# Patient Record
Sex: Female | Born: 1965
Health system: Southern US, Community
[De-identification: ages and names within clinical notes are randomized; demographics above are authoritative.]

## PROBLEM LIST (undated history)

## (undated) ENCOUNTER — Emergency Department (HOSPITAL_COMMUNITY): Payer: 59 | Source: Home / Self Care

## (undated) DIAGNOSIS — I10 Essential (primary) hypertension: Secondary | ICD-10-CM

## (undated) DIAGNOSIS — C20 Malignant neoplasm of rectum: Secondary | ICD-10-CM

## (undated) DIAGNOSIS — C801 Malignant (primary) neoplasm, unspecified: Secondary | ICD-10-CM

## (undated) DIAGNOSIS — F32A Depression, unspecified: Secondary | ICD-10-CM

## (undated) DIAGNOSIS — G8929 Other chronic pain: Secondary | ICD-10-CM

## (undated) DIAGNOSIS — M549 Dorsalgia, unspecified: Secondary | ICD-10-CM

## (undated) DIAGNOSIS — F329 Major depressive disorder, single episode, unspecified: Secondary | ICD-10-CM

## (undated) DIAGNOSIS — E0590 Thyrotoxicosis, unspecified without thyrotoxic crisis or storm: Secondary | ICD-10-CM

## (undated) HISTORY — DX: Depression, unspecified: F32.A

## (undated) HISTORY — DX: Major depressive disorder, single episode, unspecified: F32.9

## (undated) HISTORY — PX: ILEOSTOMY: SHX1783

## (undated) HISTORY — PX: BLADDER SURGERY: SHX569

## (undated) HISTORY — PX: VAGINAL HYSTERECTOMY: SHX2639

## (undated) HISTORY — PX: EYE SURGERY: SHX253

## (undated) HISTORY — PX: ABDOMINAL HYSTERECTOMY: SHX81

---

## 2002-09-14 ENCOUNTER — Encounter: Payer: Self-pay | Admitting: Family Medicine

## 2002-09-14 ENCOUNTER — Ambulatory Visit (HOSPITAL_COMMUNITY): Admission: RE | Admit: 2002-09-14 | Discharge: 2002-09-14 | Payer: Self-pay | Admitting: Family Medicine

## 2004-06-14 ENCOUNTER — Emergency Department (HOSPITAL_COMMUNITY): Admission: EM | Admit: 2004-06-14 | Discharge: 2004-06-14 | Payer: Self-pay | Admitting: Emergency Medicine

## 2006-12-02 ENCOUNTER — Emergency Department (HOSPITAL_COMMUNITY): Admission: EM | Admit: 2006-12-02 | Discharge: 2006-12-03 | Payer: Self-pay | Admitting: Emergency Medicine

## 2009-01-08 ENCOUNTER — Encounter (INDEPENDENT_AMBULATORY_CARE_PROVIDER_SITE_OTHER): Payer: Self-pay | Admitting: Unknown Physician Specialty

## 2009-01-08 ENCOUNTER — Other Ambulatory Visit: Admission: RE | Admit: 2009-01-08 | Discharge: 2009-01-08 | Payer: Self-pay | Admitting: Unknown Physician Specialty

## 2010-09-07 ENCOUNTER — Emergency Department (HOSPITAL_COMMUNITY): Payer: Self-pay

## 2010-09-07 ENCOUNTER — Emergency Department (HOSPITAL_COMMUNITY)
Admission: EM | Admit: 2010-09-07 | Discharge: 2010-09-07 | Disposition: A | Discharge status: Home / Self Care | Payer: Self-pay | Attending: Emergency Medicine | Admitting: Emergency Medicine

## 2010-09-07 DIAGNOSIS — J069 Acute upper respiratory infection, unspecified: Secondary | ICD-10-CM | POA: Insufficient documentation

## 2010-09-07 DIAGNOSIS — J029 Acute pharyngitis, unspecified: Secondary | ICD-10-CM | POA: Insufficient documentation

## 2010-09-07 DIAGNOSIS — Z79899 Other long term (current) drug therapy: Secondary | ICD-10-CM | POA: Insufficient documentation

## 2010-09-07 LAB — RAPID STREP SCREEN (MED CTR MEBANE ONLY): Streptococcus, Group A Screen (Direct): NEGATIVE

## 2011-03-17 LAB — WOUND CULTURE

## 2013-02-10 ENCOUNTER — Telehealth: Payer: Self-pay | Admitting: Family Medicine

## 2013-02-13 NOTE — Telephone Encounter (Signed)
Unable to reach by phone

## 2013-11-26 ENCOUNTER — Emergency Department (HOSPITAL_COMMUNITY)
Admission: EM | Admit: 2013-11-26 | Discharge: 2013-11-26 | Disposition: A | Discharge status: Home / Self Care | Payer: Self-pay | Attending: Emergency Medicine | Admitting: Emergency Medicine

## 2013-11-26 ENCOUNTER — Encounter (HOSPITAL_COMMUNITY): Payer: Self-pay | Admitting: Emergency Medicine

## 2013-11-26 DIAGNOSIS — I10 Essential (primary) hypertension: Secondary | ICD-10-CM | POA: Insufficient documentation

## 2013-11-26 DIAGNOSIS — G629 Polyneuropathy, unspecified: Secondary | ICD-10-CM

## 2013-11-26 DIAGNOSIS — G609 Hereditary and idiopathic neuropathy, unspecified: Secondary | ICD-10-CM | POA: Insufficient documentation

## 2013-11-26 DIAGNOSIS — R739 Hyperglycemia, unspecified: Secondary | ICD-10-CM

## 2013-11-26 DIAGNOSIS — Z79899 Other long term (current) drug therapy: Secondary | ICD-10-CM | POA: Insufficient documentation

## 2013-11-26 DIAGNOSIS — R7309 Other abnormal glucose: Secondary | ICD-10-CM | POA: Insufficient documentation

## 2013-11-26 HISTORY — DX: Essential (primary) hypertension: I10

## 2013-11-26 LAB — BASIC METABOLIC PANEL
BUN: 12 mg/dL (ref 6–23)
CALCIUM: 9 mg/dL (ref 8.4–10.5)
CO2: 28 meq/L (ref 19–32)
Chloride: 102 mEq/L (ref 96–112)
Creatinine, Ser: 0.63 mg/dL (ref 0.50–1.10)
GFR calc Af Amer: >90 mL/min (ref >90)
GLUCOSE: 144 mg/dL — AB (ref 70–99)
Potassium: 3.8 mEq/L (ref 3.7–5.3)
Sodium: 138 mEq/L (ref 137–147)

## 2013-11-26 MED ORDER — GABAPENTIN 300 MG PO CAPS: 300.0000 mg | ORAL_CAPSULE | Freq: Two times a day (BID) | ORAL | Status: DC

## 2013-11-26 MED ORDER — HYDROCODONE-ACETAMINOPHEN 5-325 MG PO TABS: 1.0000 | ORAL_TABLET | ORAL | Status: DC | PRN

## 2013-11-26 MED ORDER — IBUPROFEN 800 MG PO TABS
800.0000 mg | ORAL_TABLET | Freq: Once | ORAL | Status: AC
Start: 2013-11-26 — End: 2013-11-26
  Administered 2013-11-26: 800 mg via ORAL
  Filled 2013-11-26: qty 1

## 2013-11-26 NOTE — ED Notes (Addendum)
My c/o intermittent bilateral foot pain. Pt describes pain as throbbing.

## 2013-11-26 NOTE — Discharge Instructions (Signed)
High Blood Sugar High blood sugar (hyperglycemia) means that the level of sugar in your blood is higher than it should be. Signs of high blood sugar include:  Feeling thirsty.  Frequent peeing (urinating).  Feeling tired or sleepy.  Dry mouth.  Vision changes.  Feeling weak.  Feeling hungry but losing weight.  Numbness and tingling in your hands or feet.  Headache. When you ignore these signs, your blood sugar may keep going up. These problems may get worse, and other problems may begin. HOME CARE  Check your blood sugars as told by your doctor. Write down the numbers with the date and time.  Take the right amount of insulin or diabetes pills at the right time. Write down the dose with date and time.  Refill your insulin or diabetes pills before running out.  Watch what you eat. Follow your meal plan.  Drink liquids without sugar, such as water. Check with your doctor if you have kidney or heart disease.  Follow your doctor's orders for exercise. Exercise at the same time of day.  Keep your doctor's appointments. GET HELP RIGHT AWAY IF:   You have trouble thinking or are confused.  You have fast breathing with fruity smelling breath.  You pass out (faint).  You have 2 to 3 days of high blood sugars and you do not know why.  You have chest pain.  You are feeling sick to your stomach (nauseous) or throwing up (vomiting).  You have sudden vision changes. MAKE SURE YOU:   Understand these instructions.  Will watch your condition.  Will get help right away if you are not doing well or get worse. Document Released: 03/15/2009 Document Revised: 08/10/2011 Document Reviewed: 03/15/2009 [facility] Patient Information 2015 [place], [place]. This information is not intended to replace advice given to you by your health care provider. Make sure you discuss any questions you have with your health care provider.  Peripheral Neuropathy Peripheral neuropathy is a type of  nerve damage. It affects nerves that carry signals between the spinal cord and other parts of the body. These are called peripheral nerves. With peripheral neuropathy, one nerve or a group of nerves may be damaged.  CAUSES  Many things can damage peripheral nerves. For some people with peripheral neuropathy, the cause is unknown. Some causes include:  Diabetes. This is the most common cause of peripheral neuropathy.  Injury to a nerve.  Pressure or stress on a nerve that lasts a long time.  Too little vitamin B. Alcoholism can lead to this.  Infections.  Autoimmune diseases, such as multiple sclerosis and systemic lupus erythematosus.  Inherited nerve diseases.  Some medicines, such as cancer drugs.  Toxic substances, such as lead and mercury.  Too little blood flowing to the legs.  Kidney disease.  Thyroid disease. SIGNS AND SYMPTOMS  Different people have different symptoms. The symptoms you have will depend on which of your nerves is damaged. Common symptoms include:  Loss of feeling (numbness) in the feet and hands.  Tingling in the feet and hands.  Pain that burns.  Very sensitive skin.  Weakness.  Not being able to move a part of the body (paralysis).  Muscle twitching.  Clumsiness or poor coordination.  Loss of balance.  Not being able to control your bladder.  Feeling dizzy.  Sexual problems. DIAGNOSIS  Peripheral neuropathy is a symptom, not a disease. Finding the cause of peripheral neuropathy can be hard. To figure that out, your health care provider will take  a medical history and do a physical exam. A neurological exam will also be done. This involves checking things affected by your brain, spinal cord, and nerves (nervous system). For example, your health care provider will check your reflexes, how you move, and what you can feel.  Other types of tests may also be ordered, such as:  Blood tests.  A test of the fluid in your spinal  cord.  Imaging tests, such as CT scans or an MRI.  Electromyography (EMG). This test checks the nerves that control muscles.  Nerve conduction velocity tests. These tests check how fast messages pass through your nerves.  Nerve biopsy. A small piece of nerve is removed. It is then checked under a microscope. TREATMENT   Medicine is often used to treat peripheral neuropathy. Medicines may include:  Pain-relieving medicines. Prescription or over-the-counter medicine may be suggested.  Antiseizure medicine. This may be used for pain.  Antidepressants. These also may help ease pain from neuropathy.  Lidocaine. This is a numbing medicine. You might wear a patch or be given a shot.  Mexiletine. This medicine is typically used to help control irregular heart rhythms.  Surgery. Surgery may be needed to relieve pressure on a nerve or to destroy a nerve that is causing pain.  Physical therapy to help movement.  Assistive devices to help movement. HOME CARE INSTRUCTIONS   Only take over-the-counter or prescription medicines as directed by your health care provider. Follow the instructions carefully for any given medicines. Do not take any other medicines without first getting approval from your health care provider.  If you have diabetes, work closely with your health care provider to keep your blood sugar under control.  If you have numbness in your feet:  Check every day for signs of injury or infection. Watch for redness, warmth, and swelling.  Wear padded socks and comfortable shoes. These help protect your feet.  Do not do things that put pressure on your damaged nerve.  Do not smoke. Smoking keeps blood from getting to damaged nerves.  Avoid or limit alcohol. Too much alcohol can cause a lack of B vitamins. These vitamins are needed for healthy nerves.  Develop a good support system. Coping with peripheral neuropathy can be stressful. Talk to a mental health specialist or  join a support group if you are struggling.  Follow up with your health care provider as directed. SEEK MEDICAL CARE IF:   You have new signs or symptoms of peripheral neuropathy.  You are struggling emotionally from dealing with peripheral neuropathy.  You have a fever. SEEK IMMEDIATE MEDICAL CARE IF:   You have an injury or infection that is not healing.  You feel very dizzy or begin vomiting.  You have chest pain.  You have trouble breathing. Document Released: 05/08/2002 Document Revised: 01/28/2011 Document Reviewed: 01/23/2013 [facility] Patient Information 2015 [place], [place]. This information is not intended to replace advice given to you by your health care provider. Make sure you discuss any questions you have with your health care provider.   You may take the hydrocodone prescribed for pain relief.  This will make you drowsy - do not drive within 4 hours of taking this medication.

## 2013-11-26 NOTE — Progress Notes (Signed)
ED/CM noted patient did not have health insurance and/or PCP listed in the computer.  Patient was given the [facility] resources handout with information on the clinics, food pantries, and community discount card.  Patient expressed appreciation for information received.

## 2013-11-26 NOTE — ED Provider Notes (Signed)
CSN: [id number]     Arrival date & time 11/26/13  1024 History  This chart was scribed for Evalee Jefferson, PA-C, non-physician practitioner working with Tanna Furry, MD by Vernell Barrier, ED scribe. This patient was seen in room APFT20/APFT20 and the patient's care was started at 11:15 AM.     Chief Complaint  Patient presents with  . Foot Pain   The history is provided by the patient. No language interpreter was used.   HPI Comments: Destiny [name] is a 48 y.o. female who presents to the Emergency Department complaining of shooting and intermittent diffuse bilateral foot pain; onset 2 weeks ago. Episodes lasts approximately a few seconds at a time but are present throughout the day. Pain shoots through toes and occasionally goes up through the dorsum of the foot. Describes her feet sometimes feel as if she is "walking on hot coals." Pain unchanged sitting or standing. No muscle spasm.Taking Tylenol for pain with no relief. Reports no injury or trauma related to current pain. 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 Treated with 600 mg ibuprofen at that time. States back pain and current foot pain are unrelated. Was following up with a doctor regularly as well as going to physical therapy at that time. Followed by the clinic on [address] currently and was told she was prediabetic and pre-hypertensive at last visit. Has not followed up since then but states she is watching her diet. No personal or family hx of gout. Denies back pain, fever.   Past Medical History  Diagnosis Date  . Hypertension    Past Surgical History  Procedure Laterality Date  . Eye surgery     No family history on file. History  Substance Use Topics  . Smoking status: Never Smoker   . Smokeless tobacco: Not on file  . Alcohol Use: No   OB History   Grav Para Term Preterm Abortions TAB SAB Ect Mult Living                 Review of Systems   Constitutional: Negative for fever.  Musculoskeletal: Negative for arthralgias and myalgias.  Neurological: Positive for numbness. Negative for weakness.   Allergies  Review of patient's allergies indicates no known allergies.  Home Medications   Prior to Admission medications   Medication Sig Start Date End Date Taking? Authorizing [employer]  gabapentin (NEURONTIN) 300 MG capsule Take 1 capsule (300 mg total) by mouth 2 (two) times daily. 11/26/13   Evalee Jefferson, PA-C  HYDROcodone-acetaminophen (NORCO/VICODIN) 5-325 MG per tablet Take 1 tablet by mouth every 4 (four) hours as needed. 11/26/13   Evalee Jefferson, PA-C   Triage vitals: BP 132/66  Pulse 88  Temp(Src) 97.3 F (36.3 C) (Oral)  Resp 18  Ht 5' 1.5" (1.562 m)  Wt 204 lb (92.534 kg)  BMI 37.93 kg/m2  SpO2 97%  LMP 11/12/2013  Physical Exam  Nursing note and vitals reviewed. Constitutional: She appears well-developed and well-nourished.  HENT:  Head: Atraumatic.  Neck: Normal range of motion.  Cardiovascular:  Pulses:      Dorsalis pedis pulses are 2+ on the right side, and 2+ on the left side.  Pulses equal bilaterally  Musculoskeletal: She exhibits tenderness.  No reproducible pain with palpation or light touch to bilateral feet. Dorsalis pedis pulses are full.  Neurological: She is alert. She has normal strength. She displays normal reflexes. No sensory deficit.  Skin: Skin is warm and dry.  Psychiatric: She has a normal mood and affect.    ED Course  Procedures (including critical care time) DIAGNOSTIC STUDIES: Oxygen Saturation is 97% on room air, normal by my interpretation.    COORDINATION OF CARE: At 11:23 AM: Discussed treatment plan with patient which includes BMP. Patient agrees.    Labs Review Results for orders placed during the hospital encounter of 29/51/88  BASIC METABOLIC PANEL      Result Value Ref Range   Sodium 138  137 - 147 mEq/L   Potassium 3.8  3.7 - 5.3 mEq/L   Chloride 102  96 - 112  mEq/L   CO2 28  19 - 32 mEq/L   Glucose, Bld 144 (*) 70 - 99 mg/dL   BUN 12  6 - 23 mg/dL   Creatinine, Ser 0.63  0.50 - 1.10 mg/dL   Calcium 9.0  8.4 - 10.5 mg/dL   GFR calc non Af Amer >90  >90 mL/min   GFR calc Af Amer >90  >90 mL/min    Imaging Review No results found.   EKG Interpretation None      MDM   Final diagnoses:  Peripheral neuropathy  Hyperglycemia   Patients labs and/or radiological studies were viewed and considered during the medical decision making and disposition process. Discussed patient with Dr. Jeneen Rinks who advised starting patient on gabapentin. Also prescribed hydrocodone for sx relief until gabapentin starts being effective.   She was advised f/u with her doctor for a recheck of her condition for further management prn.  The patient appears reasonably screened and/or stabilized for discharge and I doubt any other medical condition or other [facility] requiring further screening, evaluation, or treatment in the ED at this time prior to discharge.  I personally performed the services described in this documentation, which was scribed in my presence. The recorded information has been reviewed and is accurate.     Evalee Jefferson, PA-C 11/28/13 1243

## 2013-11-26 NOTE — ED Notes (Signed)
PT c/o bilateral foot throbbing pains worsening over the past 2 weeks. PT also c/o numbness to feet at times.

## 2013-12-04 NOTE — ED Provider Notes (Signed)
Medical screening examination/treatment/procedure(s) were performed by non-physician practitioner and as supervising physician I was immediately available for consultation/collaboration.   EKG Interpretation None        Tanna Furry, MD 12/04/13 [phone]

## 2014-05-06 ENCOUNTER — Emergency Department (HOSPITAL_COMMUNITY)
Admission: EM | Admit: 2014-05-06 | Discharge: 2014-05-06 | Disposition: A | Discharge status: Home / Self Care | Payer: Self-pay | Attending: Emergency Medicine | Admitting: Emergency Medicine

## 2014-05-06 ENCOUNTER — Emergency Department (HOSPITAL_COMMUNITY): Payer: Self-pay

## 2014-05-06 ENCOUNTER — Encounter (HOSPITAL_COMMUNITY): Payer: Self-pay

## 2014-05-06 DIAGNOSIS — R05 Cough: Secondary | ICD-10-CM | POA: Insufficient documentation

## 2014-05-06 DIAGNOSIS — M791 Myalgia: Secondary | ICD-10-CM | POA: Insufficient documentation

## 2014-05-06 DIAGNOSIS — J029 Acute pharyngitis, unspecified: Secondary | ICD-10-CM | POA: Insufficient documentation

## 2014-05-06 DIAGNOSIS — R509 Fever, unspecified: Secondary | ICD-10-CM | POA: Insufficient documentation

## 2014-05-06 DIAGNOSIS — R0981 Nasal congestion: Secondary | ICD-10-CM | POA: Insufficient documentation

## 2014-05-06 DIAGNOSIS — R062 Wheezing: Secondary | ICD-10-CM | POA: Insufficient documentation

## 2014-05-06 DIAGNOSIS — J3489 Other specified disorders of nose and nasal sinuses: Secondary | ICD-10-CM | POA: Insufficient documentation

## 2014-05-06 DIAGNOSIS — R6889 Other general symptoms and signs: Secondary | ICD-10-CM

## 2014-05-06 DIAGNOSIS — R51 Headache: Secondary | ICD-10-CM | POA: Insufficient documentation

## 2014-05-06 MED ORDER — PREDNISONE 20 MG PO TABS
40.0000 mg | ORAL_TABLET | Freq: Once | ORAL | Status: AC
  Administered 2014-05-06: 40 mg via ORAL
  Filled 2014-05-06: qty 2

## 2014-05-06 MED ORDER — ALBUTEROL SULFATE HFA 108 (90 BASE) MCG/ACT IN AERS
2.0000 | INHALATION_SPRAY | RESPIRATORY_TRACT | Status: DC | PRN
  Administered 2014-05-06: 2 via RESPIRATORY_TRACT
  Filled 2014-05-06: qty 6.7

## 2014-05-06 MED ORDER — HYDROCODONE-ACETAMINOPHEN 5-325 MG PO TABS: 1.0000 | ORAL_TABLET | ORAL | Status: DC | PRN

## 2014-05-06 MED ORDER — PREDNISONE 10 MG PO TABS: 20.0000 mg | ORAL_TABLET | Freq: Two times a day (BID) | ORAL | Status: DC

## 2014-05-06 MED ORDER — HYDROCOD POLST-CHLORPHEN POLST 10-8 MG/5ML PO LQCR
5.0000 mL | Freq: Once | ORAL | Status: AC
  Administered 2014-05-06: 5 mL via ORAL
  Filled 2014-05-06: qty 5

## 2014-05-06 MED ORDER — IPRATROPIUM-ALBUTEROL 0.5-2.5 (3) MG/3ML IN SOLN
3.0000 mL | Freq: Once | RESPIRATORY_TRACT | Status: AC
  Administered 2014-05-06: 3 mL via RESPIRATORY_TRACT
  Filled 2014-05-06: qty 3

## 2014-05-06 NOTE — ED Notes (Signed)
Pt seen and evaluated by EDNP for initial assessment. 

## 2014-05-06 NOTE — ED Notes (Signed)
Discharge signature system not working. Pt given d/c instructions, verbalized understanding.

## 2014-05-06 NOTE — ED Notes (Signed)
Upper respiratory congestion, fever,chills/sweats.body aches. Vomiting yesterday. No flu shot.

## 2014-05-06 NOTE — ED Provider Notes (Signed)
CSN: [id number]     Arrival date & time 05/06/14  2116 History   First MD Initiated Contact with Patient 05/06/14 2150     Chief Complaint  Patient presents with  . URI     (Consider location/radiation/quality/duration/timing/severity/associated sxs/prior Treatment) Patient is a 48 y.o. female presenting with URI. The history is provided by the patient.  URI Presenting symptoms: congestion and cough   Severity:  Moderate Duration:  7 days Timing:  Constant Progression:  Worsening Chronicity:  New Relieved by:  Nothing Worsened by:  Nothing tried Ineffective treatments:  OTC medications Associated symptoms: headaches    Destiny [name] is a 48 y.o. female who presents to the ED with cough, cold and congestion that started a week ago. She reports fever and chills but did not take her temperature. She has body aches and coughs until she vomits. She did not get flu shot this year. She has taken multiple OTC medication for cough and congestion without relief.   Past Medical History  Diagnosis Date  . Hypertension    Past Surgical History  Procedure Laterality Date  . Eye surgery     No family history on file. History  Substance Use Topics  . Smoking status: Never Smoker   . Smokeless tobacco: Not on file  . Alcohol Use: No   OB History    No data available     Review of Systems  HENT: Positive for congestion.   Respiratory: Positive for cough.   Gastrointestinal: Vomiting: with cough.  Neurological: Positive for headaches.  all other systems negative    Allergies  Review of patient's allergies indicates no known allergies.  Home Medications   Prior to Admission medications   Medication Sig Start Date End Date Taking? Authorizing Provider  HYDROcodone-acetaminophen (NORCO/VICODIN) 5-325 MG per tablet Take 1 tablet by mouth every 4 (four) hours as needed. Patient not taking: Reported on 05/06/2014 11/26/13   Evalee Jefferson, PA-C   BP 146/69 mmHg  Pulse 99  Temp(Src)  98.9 F (37.2 C) (Oral)  Resp 20  Ht 5' 1.5" (1.562 m)  Wt 180 lb 11.2 oz (81.965 kg)  BMI 33.59 kg/m2  SpO2 100%  LMP  (LMP Unknown) Physical Exam  Constitutional: She is oriented to person, place, and time. She appears well-developed and well-nourished. No distress.  HENT:  Head: Normocephalic and atraumatic.  Right Ear: Tympanic membrane normal.  Left Ear: Tympanic membrane normal.  Nose: Rhinorrhea present.  Mouth/Throat: Uvula is midline and mucous membranes are normal. Posterior oropharyngeal erythema present.  Eyes: Conjunctivae and EOM are normal.  Neck: Normal range of motion. Neck supple.  Cardiovascular: Normal rate and regular rhythm.   Pulmonary/Chest: Effort normal. No respiratory distress. She has decreased breath sounds. She has wheezes. She has no rales.  Abdominal: Soft. There is no tenderness.  Musculoskeletal: Normal range of motion.  Lymphadenopathy:    She has no cervical adenopathy.  Neurological: She is alert and oriented to person, place, and time. No cranial nerve deficit. Gait normal.  Skin: Skin is warm and dry.  Psychiatric: She has a normal mood and affect. Her behavior is normal.  Nursing note and vitals reviewed.   ED Course  Procedures  Duo Neb administered by respiratory therapy, patient with some improvement s/p treatment.  Prednisone 40 mg. PO, Tussionex 5 ml PO and Albuterol inhaler with instructions per respiratory prior to d/c.   Dg Chest 2 View  05/06/2014   CLINICAL DATA:  Fever, chills, cough, and congestion  for 1 week.  EXAM: CHEST  2 VIEW  COMPARISON:  09/07/2010  FINDINGS: The heart size and mediastinal contours are within normal limits. Both lungs are clear. The visualized skeletal structures are unremarkable.  IMPRESSION: No active cardiopulmonary disease.   Electronically Signed   By: Lucienne Capers M.D.   On: 05/06/2014 22:58     MDM  SUBJECTIVE:  Destiny [name] is a 48 y.o. female who present complaining of flu-like symptoms:  fevers, chills, myalgias, congestion, sore throat and cough for 7 days. Denies dyspnea OBJECTIVE: Appears moderately ill but not toxic; temperature as noted in vitals. Ears normal. Throat and pharynx mild erythema.  Neck supple. No adenopathy in the neck. Sinuses non tender. The chest is clear.  ASSESSMENT: Influenza like symptoms  PLAN: Symptomatic therapy suggested: rest, increase fluids, gargle prn for sore throat, use mist of vaporizer prn and return prn if symptoms persist or worsen.     Medication List    STOP taking these medications        pseudoephedrine 30 MG tablet  Commonly known as:  SUDAFED      TAKE these medications        ALKA SELTZER PLUS PO  Take 2 capsules by mouth every 6 (six) hours as needed (Cold Symptoms).     guaiFENesin 600 MG 12 hr tablet  Commonly known as:  MUCINEX  Take 600 mg by mouth 2 (two) times daily.     HYDROcodone-acetaminophen 5-325 MG per tablet  Commonly known as:  NORCO/VICODIN  Take 1 tablet by mouth every 4 (four) hours as needed.     predniSONE 10 MG tablet  Commonly known as:  DELTASONE  Take 2 tablets (20 mg total) by mouth 2 (two) times daily with a meal.           Ashley Murrain, NP 05/06/14 [place], MD 05/08/14 2050

## 2014-05-06 NOTE — Discharge Instructions (Signed)
Use the albuterol inhaler as needed, take the prednisone to help with inflammation in the lungs and take the hydrocodone for the cough and aching. Follow up with your doctor or return here for worsening symptoms.

## 2015-02-08 ENCOUNTER — Emergency Department (HOSPITAL_COMMUNITY): Payer: No Typology Code available for payment source

## 2015-02-08 ENCOUNTER — Emergency Department (HOSPITAL_COMMUNITY)
Admission: EM | Admit: 2015-02-08 | Discharge: 2015-02-09 | Disposition: A | Discharge status: Home / Self Care | Payer: No Typology Code available for payment source | Attending: Emergency Medicine | Admitting: Emergency Medicine

## 2015-02-08 ENCOUNTER — Encounter (HOSPITAL_COMMUNITY): Payer: Self-pay | Admitting: Radiology

## 2015-02-08 DIAGNOSIS — I10 Essential (primary) hypertension: Secondary | ICD-10-CM | POA: Insufficient documentation

## 2015-02-08 DIAGNOSIS — Y998 Other external cause status: Secondary | ICD-10-CM | POA: Insufficient documentation

## 2015-02-08 DIAGNOSIS — S0990XA Unspecified injury of head, initial encounter: Secondary | ICD-10-CM | POA: Insufficient documentation

## 2015-02-08 DIAGNOSIS — S3992XA Unspecified injury of lower back, initial encounter: Secondary | ICD-10-CM | POA: Insufficient documentation

## 2015-02-08 DIAGNOSIS — S032XXA Dislocation of tooth, initial encounter: Secondary | ICD-10-CM

## 2015-02-08 DIAGNOSIS — Y9389 Activity, other specified: Secondary | ICD-10-CM | POA: Diagnosis not present

## 2015-02-08 DIAGNOSIS — Y92410 Unspecified street and highway as the place of occurrence of the external cause: Secondary | ICD-10-CM | POA: Diagnosis not present

## 2015-02-08 DIAGNOSIS — Z7952 Long term (current) use of systemic steroids: Secondary | ICD-10-CM | POA: Diagnosis not present

## 2015-02-08 DIAGNOSIS — Z79899 Other long term (current) drug therapy: Secondary | ICD-10-CM | POA: Diagnosis not present

## 2015-02-08 DIAGNOSIS — S022XXA Fracture of nasal bones, initial encounter for closed fracture: Secondary | ICD-10-CM

## 2015-02-08 DIAGNOSIS — S0992XA Unspecified injury of nose, initial encounter: Secondary | ICD-10-CM | POA: Diagnosis present

## 2015-02-08 DIAGNOSIS — N938 Other specified abnormal uterine and vaginal bleeding: Secondary | ICD-10-CM | POA: Insufficient documentation

## 2015-02-08 DIAGNOSIS — S3991XA Unspecified injury of abdomen, initial encounter: Secondary | ICD-10-CM | POA: Insufficient documentation

## 2015-02-08 LAB — I-STAT CHEM 8, ED
BUN: 13 mg/dL (ref 6–20)
CHLORIDE: 104 mmol/L (ref 101–111)
CREATININE: 0.5 mg/dL (ref 0.44–1.00)
Calcium, Ion: 1.2 mmol/L (ref 1.12–1.23)
GLUCOSE: 130 mg/dL — AB (ref 65–99)
HCT: 32 % — ABNORMAL LOW (ref 36.0–46.0)
Hemoglobin: 10.9 g/dL — ABNORMAL LOW (ref 12.0–15.0)
POTASSIUM: 4.1 mmol/L (ref 3.5–5.1)
Sodium: 142 mmol/L (ref 135–145)
TCO2: 24 mmol/L (ref 0–100)

## 2015-02-08 MED ORDER — IOHEXOL 300 MG/ML  SOLN
100.0000 mL | Freq: Once | INTRAMUSCULAR | Status: AC | PRN
  Administered 2015-02-08: 100 mL via INTRAVENOUS

## 2015-02-08 MED ORDER — HYDROMORPHONE HCL 1 MG/ML IJ SOLN
1.0000 mg | Freq: Once | INTRAMUSCULAR | Status: AC
  Administered 2015-02-08: 1 mg via INTRAVENOUS
  Filled 2015-02-08: qty 1

## 2015-02-08 MED ORDER — HYDROMORPHONE HCL 1 MG/ML IJ SOLN
1.0000 mg | Freq: Once | INTRAMUSCULAR | Status: AC
Start: 2015-02-08 — End: 2015-02-08
  Administered 2015-02-08: 1 mg via INTRAVENOUS
  Filled 2015-02-08: qty 1

## 2015-02-08 MED ORDER — ONDANSETRON HCL 4 MG/2ML IJ SOLN
4.0000 mg | Freq: Once | INTRAMUSCULAR | Status: AC
  Administered 2015-02-08: 4 mg via INTRAVENOUS
  Filled 2015-02-08: qty 2
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
## 2015-02-08 NOTE — ED Notes (Signed)
Patient C/O head, low back, neck and facial pain. Patient tearful on arrival.
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
## 2015-02-09 DIAGNOSIS — S022XXA Fracture of nasal bones, initial encounter for closed fracture: Secondary | ICD-10-CM | POA: Diagnosis not present

## 2015-02-09 MED ORDER — OXYCODONE-ACETAMINOPHEN 5-325 MG PO TABS: 1.0000 | ORAL_TABLET | ORAL | Status: DC | PRN

## 2015-02-09 MED ORDER — CHLORHEXIDINE GLUCONATE 0.12 % MT SOLN: 15.0000 mL | Freq: Two times a day (BID) | OROMUCOSAL | Status: DC

## 2015-02-09 MED ORDER — ONDANSETRON HCL 4 MG PO TABS: 4.0000 mg | ORAL_TABLET | Freq: Four times a day (QID) | ORAL | Status: DC

## 2015-02-09 NOTE — ED Notes (Signed)
Instructed significant other not to let patient drink from a straw.  Verbalizes understanding.

## 2015-02-09 NOTE — ED Notes (Signed)
Ambulated in hall with one person assist.  Good gait noted.  Tolerated well.  Alert and answering questions.  Discharged home per order.

## 2015-02-09 NOTE — ED Notes (Signed)
Dr. Ralene Bathe at the bedside at this time.
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
## 2015-02-15 ENCOUNTER — Ambulatory Visit (INDEPENDENT_AMBULATORY_CARE_PROVIDER_SITE_OTHER): Payer: PRIVATE HEALTH INSURANCE | Admitting: Physician Assistant

## 2015-02-15 ENCOUNTER — Encounter: Payer: Self-pay | Admitting: Physician Assistant

## 2015-02-15 VITALS — BP 131/76 | HR 100 | Ht 61.5 in | Wt 158.0 lb

## 2015-02-15 DIAGNOSIS — R5383 Other fatigue: Secondary | ICD-10-CM | POA: Diagnosis not present

## 2015-02-15 DIAGNOSIS — F411 Generalized anxiety disorder: Secondary | ICD-10-CM | POA: Diagnosis not present

## 2015-02-15 DIAGNOSIS — F329 Major depressive disorder, single episode, unspecified: Secondary | ICD-10-CM | POA: Diagnosis not present

## 2015-02-15 DIAGNOSIS — R7989 Other specified abnormal findings of blood chemistry: Secondary | ICD-10-CM | POA: Diagnosis not present

## 2015-02-15 DIAGNOSIS — Z0000 Encounter for general adult medical examination without abnormal findings: Secondary | ICD-10-CM

## 2015-02-15 DIAGNOSIS — F32A Depression, unspecified: Secondary | ICD-10-CM

## 2015-02-15 MED ORDER — LORAZEPAM 1 MG PO TABS: 1.0000 mg | ORAL_TABLET | Freq: Two times a day (BID) | ORAL | Status: DC

## 2015-02-15 MED ORDER — DULOXETINE HCL 30 MG PO CPEP
ORAL_CAPSULE | ORAL | Status: DC
Start: 2015-02-15 — End: 2015-05-02
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
## 2015-02-16 LAB — LIPID PANEL
CHOLESTEROL TOTAL: 111 mg/dL (ref 100–199)
Chol/HDL Ratio: 2.6 ratio units (ref 0.0–4.4)
HDL: 43 mg/dL (ref >39)
LDL Calculated: 49 mg/dL (ref 0–99)
TRIGLYCERIDES: 95 mg/dL (ref 0–149)
VLDL Cholesterol Cal: 19 mg/dL (ref 5–40)

## 2015-02-16 LAB — CBC WITH DIFFERENTIAL/PLATELET
BASOS ABS: 0 10*3/uL (ref 0.0–0.2)
Basos: 0 %
EOS (ABSOLUTE): 0.2 10*3/uL (ref 0.0–0.4)
Eos: 4 %
HEMOGLOBIN: 11.5 g/dL (ref 11.1–15.9)
Hematocrit: 34.1 % (ref 34.0–46.6)
IMMATURE GRANS (ABS): 0 10*3/uL (ref 0.0–0.1)
Immature Granulocytes: 0 %
LYMPHS: 29 %
Lymphocytes Absolute: 1.8 10*3/uL (ref 0.7–3.1)
MCH: 26.5 pg — ABNORMAL LOW (ref 26.6–33.0)
MCHC: 33.7 g/dL (ref 31.5–35.7)
MCV: 79 fL (ref 79–97)
MONOCYTES: 9 %
Monocytes Absolute: 0.6 10*3/uL (ref 0.1–0.9)
NEUTROS PCT: 58 %
Neutrophils Absolute: 3.6 10*3/uL (ref 1.4–7.0)
PLATELETS: 381 10*3/uL — AB (ref 150–379)
RBC: 4.34 x10E6/uL (ref 3.77–5.28)
RDW: 13.8 % (ref 12.3–15.4)
WBC: 6.3 10*3/uL (ref 3.4–10.8)

## 2015-02-16 LAB — CMP14+EGFR
A/G RATIO: 1.1 (ref 1.1–2.5)
ALK PHOS: 119 IU/L — AB (ref 39–117)
ALT: 13 IU/L (ref 0–32)
AST: 20 IU/L (ref 0–40)
Albumin: 4.1 g/dL (ref 3.5–5.5)
BUN/Creatinine Ratio: 20 (ref 9–23)
BUN: 9 mg/dL (ref 6–24)
Bilirubin Total: 0.5 mg/dL (ref 0.0–1.2)
CALCIUM: 9.8 mg/dL (ref 8.7–10.2)
CO2: 22 mmol/L (ref 18–29)
Chloride: 101 mmol/L (ref 97–108)
Creatinine, Ser: 0.44 mg/dL — ABNORMAL LOW (ref 0.57–1.00)
GFR calc Af Amer: 137 mL/min/{1.73_m2} (ref >59)
GFR calc non Af Amer: 119 mL/min/{1.73_m2} (ref >59)
GLOBULIN, TOTAL: 3.6 g/dL (ref 1.5–4.5)
Glucose: 131 mg/dL — ABNORMAL HIGH (ref 65–99)
POTASSIUM: 4.6 mmol/L (ref 3.5–5.2)
SODIUM: 139 mmol/L (ref 134–144)
Total Protein: 7.7 g/dL (ref 6.0–8.5)

## 2015-02-16 LAB — VITAMIN B12: VITAMIN B 12: 507 pg/mL (ref 211–946)

## 2015-02-16 LAB — VITAMIN D 25 HYDROXY (VIT D DEFICIENCY, FRACTURES): VIT D 25 HYDROXY: 38 ng/mL (ref 30.0–100.0)

## 2015-02-16 LAB — TSH: TSH: <0.006 u[IU]/mL — ABNORMAL LOW (ref 0.450–4.500)

## 2015-02-17 ENCOUNTER — Other Ambulatory Visit: Payer: Self-pay | Admitting: Physician Assistant

## 2015-02-17 DIAGNOSIS — R7989 Other specified abnormal findings of blood chemistry: Secondary | ICD-10-CM

## 2015-02-18 ENCOUNTER — Other Ambulatory Visit: Payer: Self-pay | Admitting: *Deleted

## 2015-02-18 ENCOUNTER — Other Ambulatory Visit: Payer: PRIVATE HEALTH INSURANCE

## 2015-02-18 DIAGNOSIS — E0590 Thyrotoxicosis, unspecified without thyrotoxic crisis or storm: Secondary | ICD-10-CM

## 2015-02-18 DIAGNOSIS — S022XXA Fracture of nasal bones, initial encounter for closed fracture: Secondary | ICD-10-CM | POA: Insufficient documentation

## 2015-02-18 NOTE — Addendum Note (Signed)
Addended by: Earlene Plater on: 02/18/2015 04:04 PM   Modules accepted: Orders

## 2015-02-19 LAB — THYROID PANEL WITH TSH
FREE THYROXINE INDEX: 15.5 — AB (ref 1.2–4.9)
T3 Uptake Ratio: 48 % — ABNORMAL HIGH (ref 24–39)
T4 TOTAL: 32.3 ug/dL — AB (ref 4.5–12.0)

## 2015-02-21 ENCOUNTER — Other Ambulatory Visit: Payer: Self-pay | Admitting: Physician Assistant

## 2015-02-21 DIAGNOSIS — E0590 Thyrotoxicosis, unspecified without thyrotoxic crisis or storm: Secondary | ICD-10-CM

## 2015-02-27 ENCOUNTER — Encounter: Payer: Self-pay | Admitting: Endocrinology

## 2015-02-27 ENCOUNTER — Ambulatory Visit (INDEPENDENT_AMBULATORY_CARE_PROVIDER_SITE_OTHER): Payer: No Typology Code available for payment source | Admitting: Endocrinology

## 2015-02-27 VITALS — BP 134/72 | HR 88 | Temp 96.5°F | Ht 61.5 in | Wt 156.0 lb

## 2015-02-27 DIAGNOSIS — E0590 Thyrotoxicosis, unspecified without thyrotoxic crisis or storm: Secondary | ICD-10-CM

## 2015-02-27 MED ORDER — METHIMAZOLE 10 MG PO TABS: 40.0000 mg | ORAL_TABLET | Freq: Two times a day (BID) | ORAL | Status: DC

## 2015-02-27 NOTE — Progress Notes (Signed)
Subjective:    Patient ID: Destiny [name], female    DOB: March 01, 1966, 49 y.o.   MRN: [id number]  HPI Pt reports she was dx'ed with hyperthyroidism a few weeks ago.  she has never been known to have a thyroid problem before.  she has never had XRT to the anterior neck, or thyroid surgery.  she has never had dedicated thyroid imaging.  she does not consume kelp or any other prescribed or non-prescribed thyroid medication.  He has never been on amiodarone.  She has slight palpitations in the chest, and assoc weight loss (20 lbs). Past Medical History  Diagnosis Date  . Hypertension     Past Surgical History  Procedure Laterality Date  . Eye surgery      Social History   Social History  . Marital Status: Divorced    Spouse Name: N/A  . Number of Children: N/A  . Years of Education: N/A   Occupational History  . Not on file.   Social History Main Topics  . Smoking status: Never Smoker   . Smokeless tobacco: Not on file  . Alcohol Use: No  . Drug Use: No  . Sexual Activity: Yes    Birth Control/ Protection: Surgical   Other Topics Concern  . Not on file   Social History Narrative    Current Outpatient Prescriptions on File Prior to Visit  Medication Sig Dispense Refill  . acetaminophen (TYLENOL) 500 MG tablet Take 1,000 mg by mouth every 8 (eight) hours as needed for moderate pain.    . chlorhexidine (PERIDEX) 0.12 % solution 15 mLs by Mouth Rinse route 2 (two) times daily. Swish and spit twice daily 473 mL 0  . DULoxetine (CYMBALTA) 30 MG capsule Week 1: 1 capsule in the am, Week 2: 2 capsules in the am 60 capsule 3  . LORazepam (ATIVAN) 1 MG tablet Take 1 tablet (1 mg total) by mouth 2 (two) times daily. 60 tablet 0  . ondansetron (ZOFRAN) 4 MG tablet Take 1 tablet (4 mg total) by mouth every 6 (six) hours. 12 tablet 0  . oxyCODONE-acetaminophen (PERCOCET/ROXICET) 5-325 MG per tablet Take 1 tablet by mouth every 4 (four) hours as needed for severe pain. 15 tablet 0   No  current facility-administered medications on file prior to visit.    No Known Allergies  Family History  Problem Relation Age of Onset  . Thyroid disease Mother     BP 134/72 mmHg  Pulse 88  Temp(Src) 96.5 F (35.8 C) (Axillary)  Ht 5' 1.5" (1.562 m)  Wt 156 lb (70.761 kg)  BMI 29.00 kg/m2  SpO2 98%  LMP  (LMP Unknown)   Review of Systems denies fever, headache, hoarseness, visual loss, chest pain, sob, polyuria, excessive diaphoresis, anxiety, easy bruising, and rhinorrhea.  She has mild diarrhea, tremor, heat intolerance, and mild muscle weakness.      Objective:   Physical Exam VS: see vs page GEN: no distress HEAD: head: no deformity eyes: no periorbital swelling, no proptosis.  Disconjugate gaze (chronic strabismus).   external nose and ears are normal mouth: no lesion seen NECK: thyroid is approx 4-5 times normal size, diffuse CHEST WALL: no deformity LUNGS:  Clear to auscultation CV: reg rate and rhythm, no murmur ABD: abdomen is soft, nontender.  no hepatosplenomegaly.  not distended.  no hernia.   MUSCULOSKELETAL: muscle bulk and strength are grossly normal.  no obvious joint swelling.  gait is normal and steady.  EXTEMITIES: no deformity.  no ulcer on the feet.  feet are of normal color and temp.  no edema PULSES: dorsalis pedis intact bilat.  no carotid bruit NEURO:  cn 2-12 grossly intact.   readily moves all 4's.  sensation is intact to touch on the feet.  Slight tremor of the hands SKIN:  Normal texture and temperature.  No rash or suspicious lesion is visible.  Slightly diaphoretic NODES:  None palpable at the neck.  PSYCH: alert, well-oriented.  Does not appear anxious nor depressed.    CT of c-spine (02/08/15): no mention is made of a goiter  Lab Results  Component Value Date   TSH <0.006* 02/18/2015   T4TOTAL 32.3* 02/18/2015   I have reviewed outside records, and summarized: Pt was seen for anxiety, and hyperthyroidism was noted.  She was ref  here.      Assessment & Plan:  Hyperthyroidism, new, prob due to Grave's dz.  She is too hyperthyroid to have I-131 now.    Patient is advised the following: Patient Instructions  i have sent a prescription to your pharmacy, to slow down the thyroid. if ever you have fever while taking methimazole, stop it and call us, because of the risk of a rare side-effect We can reconsider the radioactive iodine in the future if you wish. Please come back for a follow-up appointment in 2-3 weeks.     Hyperthyroidism The thyroid is a large gland located in the lower front part of your neck. The thyroid helps control metabolism. Metabolism is how your body uses food. It controls metabolism with the hormone thyroxine. When the thyroid is overactive, it produces too much hormone. When this happens, these following problems may occur:   Nervousness  Heat intolerance  Weight loss (in spite of increase food intake)  Diarrhea  Change in hair or skin texture  Palpitations (heart skipping or having extra beats)  Tachycardia (rapid heart rate)  Loss of menstruation (amenorrhea)  Shaking of the hands CAUSES  Grave's Disease (the immune system attacks the thyroid gland). This is the most common cause.  Inflammation of the thyroid gland.  Tumor (usually benign) in the thyroid gland or elsewhere.  Excessive use of thyroid medications (both prescription and 'natural').  Excessive ingestion of Iodine. DIAGNOSIS  To prove hyperthyroidism, your caregiver may do blood tests and ultrasound tests. Sometimes the signs are hidden. It may be necessary for your caregiver to watch this illness with blood tests, either before or after diagnosis and treatment. TREATMENT Short-term treatment There are several treatments to control symptoms. Drugs called beta blockers may give some relief. Drugs that decrease hormone production will provide temporary relief in many people. These measures will usually not give  permanent relief. Definitive therapy There are treatments available which can be discussed between you and your caregiver which will permanently treat the problem. These treatments range from surgery (removal of the thyroid), to the use of radioactive iodine (destroys the thyroid by radiation), to the use of antithyroid drugs (interfere with hormone synthesis). The first two treatments are permanent and usually successful. They most often require hormone replacement therapy for life. This is because it is impossible to remove or destroy the exact amount of thyroid required to make a person euthyroid (normal). HOME CARE INSTRUCTIONS  See your caregiver if the problems you are being treated for get worse. Examples of this would be the problems listed above. SEEK MEDICAL CARE IF: Your general condition worsens. MAKE SURE YOU:   Understand these instructions.  Will  watch your condition.  Will get help right away if you are not doing well or get worse. Document Released: 05/18/2005 Document Revised: 08/10/2011 Document Reviewed: 09/29/2006 [facility] Patient Information 2015 [place], [place]. This information is not intended to replace advice given to you by your health care provider. Make sure you discuss any questions you have with your health care provider.

## 2015-02-27 NOTE — Patient Instructions (Addendum)
i have sent a prescription to your pharmacy, to slow down the thyroid. if ever you have fever while taking methimazole, stop it and call us, because of the risk of a rare side-effect We can reconsider the radioactive iodine in the future if you wish. Please come back for a follow-up appointment in 2-3 weeks.     Hyperthyroidism The thyroid is a large gland located in the lower front part of your neck. The thyroid helps control metabolism. Metabolism is how your body uses food. It controls metabolism with the hormone thyroxine. When the thyroid is overactive, it produces too much hormone. When this happens, these following problems may occur:   Nervousness  Heat intolerance  Weight loss (in spite of increase food intake)  Diarrhea  Change in hair or skin texture  Palpitations (heart skipping or having extra beats)  Tachycardia (rapid heart rate)  Loss of menstruation (amenorrhea)  Shaking of the hands CAUSES  Grave's Disease (the immune system attacks the thyroid gland). This is the most common cause.  Inflammation of the thyroid gland.  Tumor (usually benign) in the thyroid gland or elsewhere.  Excessive use of thyroid medications (both prescription and 'natural').  Excessive ingestion of Iodine. DIAGNOSIS  To prove hyperthyroidism, your caregiver may do blood tests and ultrasound tests. Sometimes the signs are hidden. It may be necessary for your caregiver to watch this illness with blood tests, either before or after diagnosis and treatment. TREATMENT Short-term treatment There are several treatments to control symptoms. Drugs called beta blockers may give some relief. Drugs that decrease hormone production will provide temporary relief in many people. These measures will usually not give permanent relief. Definitive therapy There are treatments available which can be discussed between you and your caregiver which will permanently treat the problem. These treatments  range from surgery (removal of the thyroid), to the use of radioactive iodine (destroys the thyroid by radiation), to the use of antithyroid drugs (interfere with hormone synthesis). The first two treatments are permanent and usually successful. They most often require hormone replacement therapy for life. This is because it is impossible to remove or destroy the exact amount of thyroid required to make a person euthyroid (normal). HOME CARE INSTRUCTIONS  See your caregiver if the problems you are being treated for get worse. Examples of this would be the problems listed above. SEEK MEDICAL CARE IF: Your general condition worsens. MAKE SURE YOU:   Understand these instructions.  Will watch your condition.  Will get help right away if you are not doing well or get worse. Document Released: 05/18/2005 Document Revised: 08/10/2011 Document Reviewed: 09/29/2006 [facility] Patient Information 2015 [place], [place]. This information is not intended to replace advice given to you by your health care provider. Make sure you discuss any questions you have with your health care provider.

## 2015-03-20 ENCOUNTER — Ambulatory Visit (INDEPENDENT_AMBULATORY_CARE_PROVIDER_SITE_OTHER): Payer: No Typology Code available for payment source | Admitting: Endocrinology

## 2015-03-20 ENCOUNTER — Encounter: Payer: Self-pay | Admitting: Endocrinology

## 2015-03-20 VITALS — BP 128/76 | HR 92 | Temp 98.3°F | Ht 62.0 in | Wt 161.0 lb

## 2015-03-20 DIAGNOSIS — E0590 Thyrotoxicosis, unspecified without thyrotoxic crisis or storm: Secondary | ICD-10-CM

## 2015-03-20 LAB — TSH: TSH: 0.23 u[IU]/mL — AB (ref 0.35–4.50)

## 2015-03-20 LAB — T4, FREE: Free T4: 2.6 ng/dL — ABNORMAL HIGH (ref 0.60–1.60)

## 2015-03-20 MED ORDER — METHIMAZOLE 10 MG PO TABS: 10.0000 mg | ORAL_TABLET | Freq: Two times a day (BID) | ORAL | Status: DC

## 2015-03-20 NOTE — Progress Notes (Signed)
   Subjective:    Patient ID: Destiny [name], female    DOB: 09-03-1965, 49 y.o.   MRN: [id number]  HPI Pt returns for f/u of hyperthyroidism (dx'ed 2016; probably due to [place] dz; she has never had dedicated thyroid imaging; tapazole was chosen as rx, due to severity).  Since on the tapazole, she feels better in general.   Past Medical History  Diagnosis Date  . Hypertension     Past Surgical History  Procedure Laterality Date  . Eye surgery      Social History   Social History  . Marital Status: Divorced    Spouse Name: N/A  . Number of Children: N/A  . Years of Education: N/A   Occupational History  . Not on file.   Social History Main Topics  . Smoking status: Never Smoker   . Smokeless tobacco: Not on file  . Alcohol Use: No  . Drug Use: No  . Sexual Activity: Yes    Birth Control/ Protection: Surgical   Other Topics Concern  . Not on file   Social History Narrative    Current Outpatient Prescriptions on File Prior to Visit  Medication Sig Dispense Refill  . acetaminophen (TYLENOL) 500 MG tablet Take 1,000 mg by mouth every 8 (eight) hours as needed for moderate pain.    . chlorhexidine (PERIDEX) 0.12 % solution 15 mLs by Mouth Rinse route 2 (two) times daily. Swish and spit twice daily 473 mL 0  . DULoxetine (CYMBALTA) 30 MG capsule Week 1: 1 capsule in the am, Week 2: 2 capsules in the am 60 capsule 3  . LORazepam (ATIVAN) 1 MG tablet Take 1 tablet (1 mg total) by mouth 2 (two) times daily. 60 tablet 0  . ondansetron (ZOFRAN) 4 MG tablet Take 1 tablet (4 mg total) by mouth every 6 (six) hours. 12 tablet 0  . oxyCODONE-acetaminophen (PERCOCET/ROXICET) 5-325 MG per tablet Take 1 tablet by mouth every 4 (four) hours as needed for severe pain. 15 tablet 0   No current facility-administered medications on file prior to visit.    No Known Allergies  Family History  Problem Relation Age of Onset  . Thyroid disease Mother     BP 128/76 mmHg  Pulse 92   Temp(Src) 98.3 F (36.8 C) (Oral)  Ht 5\' 2"  (1.575 m)  Wt 161 lb (73.029 kg)  BMI 29.44 kg/m2  SpO2 99%  LMP  (LMP Unknown)  Review of Systems Denies fever    Objective:   Physical Exam VITAL SIGNS:  See vs page GENERAL: no distress Skin: not diaphoretic.   Neuro: slight tremor of the hands.      Lab Results  Component Value Date   TSH 0.23* 03/20/2015   T4TOTAL 32.3* 02/18/2015      Assessment & Plan:  Hyperthyroidism, improved  Patient is advised the following: Patient Instructions  blood tests are requested for you today.  We'll let you know about the results.   if ever you have fever while taking methimazole, stop it and call us, because of the risk of a rare side-effect.   We can reconsider the radioactive iodine in the future if you wish.   Please come back for a follow-up appointment in 6 weeks.     addendum: reduce the methimazole to 10 mg bid

## 2015-03-20 NOTE — Patient Instructions (Addendum)
blood tests are requested for you today.  We'll let you know about the results.   if ever you have fever while taking methimazole, stop it and call us, because of the risk of a rare side-effect.   We can reconsider the radioactive iodine in the future if you wish.   Please come back for a follow-up appointment in 6 weeks.

## 2015-03-22 ENCOUNTER — Telehealth: Payer: Self-pay | Admitting: Endocrinology

## 2015-03-22 NOTE — Telephone Encounter (Signed)
Patient is calling for the results of her lab work °

## 2015-03-26 NOTE — Telephone Encounter (Signed)
Patient advised of lab work on 03/22/2015.

## 2015-05-01 ENCOUNTER — Encounter: Payer: Self-pay | Admitting: Endocrinology

## 2015-05-01 ENCOUNTER — Ambulatory Visit (INDEPENDENT_AMBULATORY_CARE_PROVIDER_SITE_OTHER): Payer: No Typology Code available for payment source | Admitting: Endocrinology

## 2015-05-01 VITALS — BP 152/84 | HR 76 | Temp 97.8°F | Ht 62.0 in | Wt 177.0 lb

## 2015-05-01 DIAGNOSIS — E0590 Thyrotoxicosis, unspecified without thyrotoxic crisis or storm: Secondary | ICD-10-CM | POA: Diagnosis not present

## 2015-05-01 LAB — TSH: TSH: 0.52 u[IU]/mL (ref 0.35–4.50)

## 2015-05-01 NOTE — Patient Instructions (Addendum)
Please avoid biotin, as this interferes with your thyroid blood test.   blood tests are requested for you today.  We'll let you know about the results.   if ever you have fever while taking methimazole, stop it and call us, even if the reason is obvious, because of the risk of a rare side-effect.   Please come back for a follow-up appointment in 2 months.

## 2015-05-01 NOTE — Progress Notes (Signed)
   Subjective:    Patient ID: Destiny [name], female    DOB: Feb 27, 1966, 49 y.o.   MRN: [id number]  HPI Pt returns for f/u of hyperthyroidism (dx'ed 2016; probably due to [place] dz; she has never had dedicated thyroid imaging; tapazole was chosen as rx, due to severity).  Since on the tapazole, she feels better in general.   Past Medical History  Diagnosis Date  . Hypertension     Past Surgical History  Procedure Laterality Date  . Eye surgery      Social History   Social History  . Marital Status: Divorced    Spouse Name: N/A  . Number of Children: N/A  . Years of Education: N/A   Occupational History  . Not on file.   Social History Main Topics  . Smoking status: Never Smoker   . Smokeless tobacco: Not on file  . Alcohol Use: No  . Drug Use: No  . Sexual Activity: Yes    Birth Control/ Protection: Surgical   Other Topics Concern  . Not on file   Social History Narrative    Current Outpatient Prescriptions on File Prior to Visit  Medication Sig Dispense Refill  . acetaminophen (TYLENOL) 500 MG tablet Take 1,000 mg by mouth every 8 (eight) hours as needed for moderate pain.    . methimazole (TAPAZOLE) 10 MG tablet Take 1 tablet (10 mg total) by mouth 2 (two) times daily. 60 tablet 1   No current facility-administered medications on file prior to visit.    No Known Allergies  Family History  Problem Relation Age of Onset  . Thyroid disease Mother     BP 152/84 mmHg  Pulse 76  Temp(Src) 97.8 F (36.6 C) (Oral)  Ht 5\' 2"  (1.575 m)  Wt 177 lb (80.287 kg)  BMI 32.37 kg/m2  SpO2 97%  LMP  (LMP Unknown)   Review of Systems Denies fever    Objective:   Physical Exam VITAL SIGNS:  See vs page GENERAL: no distress NECK: thyroid is approx 4-5 times normal size, diffuse Skin: not diaphoretic Neuro: no tremor   Lab Results  Component Value Date   TSH 0.52 05/01/2015   T4TOTAL 32.3* 02/18/2015      Assessment & Plan:  hperthyroidism:  well-controlled.  she chooses tapazole long-term, due to cost of I-131 rx  Patient is advised the following: Patient Instructions  Please avoid biotin, as this interferes with your thyroid blood test.   blood tests are requested for you today.  We'll let you know about the results.   if ever you have fever while taking methimazole, stop it and call us, even if the reason is obvious, because of the risk of a rare side-effect.   Please come back for a follow-up appointment in 2 months.    addendum: Please continue the same medication.

## 2015-05-02 ENCOUNTER — Encounter: Payer: Self-pay | Admitting: Family Medicine

## 2015-05-02 ENCOUNTER — Other Ambulatory Visit: Payer: Self-pay | Admitting: *Deleted

## 2015-05-02 ENCOUNTER — Ambulatory Visit (INDEPENDENT_AMBULATORY_CARE_PROVIDER_SITE_OTHER): Payer: PRIVATE HEALTH INSURANCE | Admitting: Family Medicine

## 2015-05-02 VITALS — BP 140/77 | HR 79 | Temp 97.1°F | Ht 62.0 in | Wt 174.0 lb

## 2015-05-02 DIAGNOSIS — I159 Secondary hypertension, unspecified: Secondary | ICD-10-CM | POA: Diagnosis not present

## 2015-05-02 DIAGNOSIS — G6289 Other specified polyneuropathies: Secondary | ICD-10-CM | POA: Diagnosis not present

## 2015-05-02 DIAGNOSIS — R7301 Impaired fasting glucose: Secondary | ICD-10-CM | POA: Diagnosis not present

## 2015-05-02 DIAGNOSIS — F32A Depression, unspecified: Secondary | ICD-10-CM | POA: Insufficient documentation

## 2015-05-02 DIAGNOSIS — F411 Generalized anxiety disorder: Secondary | ICD-10-CM

## 2015-05-02 DIAGNOSIS — F329 Major depressive disorder, single episode, unspecified: Secondary | ICD-10-CM | POA: Diagnosis not present

## 2015-05-02 LAB — POCT GLYCOSYLATED HEMOGLOBIN (HGB A1C): HEMOGLOBIN A1C: 5.5

## 2015-05-02 MED ORDER — SERTRALINE HCL 50 MG PO TABS: 50.0000 mg | ORAL_TABLET | Freq: Every day | ORAL | Status: DC

## 2015-05-02 MED ORDER — PREGABALIN 50 MG PO CAPS: 50.0000 mg | ORAL_CAPSULE | Freq: Three times a day (TID) | ORAL | Status: DC

## 2015-05-02 MED ORDER — HYDROCHLOROTHIAZIDE 12.5 MG PO TABS: 12.5000 mg | ORAL_TABLET | Freq: Every day | ORAL | Status: DC

## 2015-05-02 MED ORDER — LORAZEPAM 0.5 MG PO TABS: 0.5000 mg | ORAL_TABLET | Freq: Two times a day (BID) | ORAL | Status: DC

## 2015-05-02 MED ORDER — GABAPENTIN 300 MG PO CAPS: ORAL_CAPSULE | ORAL | Status: DC

## 2015-05-02 NOTE — Assessment & Plan Note (Signed)
Change to Zoloft and decrease Ativan

## 2015-05-02 NOTE — Progress Notes (Signed)
BP 140/77 mmHg  Pulse 79  Temp(Src) 97.1 F (36.2 C) (Oral)  Ht 5\' 2"  (1.575 m)  Wt 174 lb (78.926 kg)  BMI 31.82 kg/m2  LMP  (LMP Unknown)   Subjective:    Patient ID: Destiny [name], female    DOB: 04/19/1966, 49 y.o.   MRN: [id number]  HPI: Destiny [name] is a 49 y.o. female presenting on 05/02/2015 for Foot Pain; Medication Problem; and Irritable Bowel Syndrome   HPI Hypertension Patient presents today because she is needs a hypertension check she was elevated at her last appointment with her endocrinologist in the 150s over 80s and then she has been elevated today. She's never been on medication for this but she does have hyperthyroidism which they're trying to get under control and may be attributing to her elevated blood pressure. She denies any headaches, blurred vision, chest pain, shortness of breath, focal numbness or weakness.  Depression Patient has had significant depression and been on Cymbalta before and feels like it is not doing anything for her depression or her neuropathy.  Sleep Issues:  Yes Interests and hobbies decrease: Yes Guilt:  No Energy Loss:  Yes Concentration difficulties:  Yes Appetite changes:  Yes Psychomotor Retardation:  No Suicidal Ideations:  No  Elevated fasting glucose Patient also had an elevated fasting glucose on some labs and that in the hospital they told her to follow-up with her primary care physician. Hemoglobin A1c will be done today.  Burning in her feet Patient has been diagnosed with peripheral neuropathy of unknown origin. She has had this for at least a few years. She's been on Cymbalta without much help. She describes it as a burning and tingling in numbness pain on the plantar surface of both feet near the toes. She does not know of any back pain or anything that would significantly of triggered this. The pain is bilateral and comes and goes throughout the day every day.  Relevant past medical, surgical, family and social  history reviewed and updated as indicated. Interim medical history since our last visit reviewed. Allergies and medications reviewed and updated.  Review of Systems  Constitutional: Negative for fever and chills.  HENT: Negative for congestion, ear discharge and ear pain.   Eyes: Negative for redness and visual disturbance.  Respiratory: Negative for chest tightness and shortness of breath.   Cardiovascular: Negative for chest pain and leg swelling.  Genitourinary: Negative for dysuria and difficulty urinating.  Musculoskeletal: Negative for back pain and gait problem.  Skin: Negative for rash.  Neurological: Positive for numbness. Negative for dizziness, speech difficulty, weakness, light-headedness and headaches.  Psychiatric/Behavioral: Positive for sleep disturbance, dysphoric mood and decreased concentration. Negative for suicidal ideas, hallucinations, behavioral problems, self-injury and agitation. The patient is nervous/anxious.   All other systems reviewed and are negative.   Per HPI unless specifically indicated above     Medication List       This list is accurate as of: 05/02/15 10:19 AM.  Always use your most recent med list.               acetaminophen 500 MG tablet  Commonly known as:  TYLENOL  Take 1,000 mg by mouth every 8 (eight) hours as needed for moderate pain.     hydrochlorothiazide 12.5 MG tablet  Commonly known as:  HYDRODIURIL  Take 1 tablet (12.5 mg total) by mouth daily.     LORazepam 0.5 MG tablet  Commonly known as:  ATIVAN  Take  1 tablet (0.5 mg total) by mouth 2 (two) times daily.     methimazole 10 MG tablet  Commonly known as:  TAPAZOLE  Take 1 tablet (10 mg total) by mouth 2 (two) times daily.     pregabalin 50 MG capsule  Commonly known as:  LYRICA  Take 1 capsule (50 mg total) by mouth 3 (three) times daily.     sertraline 50 MG tablet  Commonly known as:  ZOLOFT  Take 1 tablet (50 mg total) by mouth daily.             Objective:    BP 140/77 mmHg  Pulse 79  Temp(Src) 97.1 F (36.2 C) (Oral)  Ht 5\' 2"  (1.575 m)  Wt 174 lb (78.926 kg)  BMI 31.82 kg/m2  LMP  (LMP Unknown)  Wt Readings from Last 3 Encounters:  05/02/15 174 lb (78.926 kg)  05/01/15 177 lb (80.287 kg)  03/20/15 161 lb (73.029 kg)    Physical Exam  Constitutional: She is oriented to person, place, and time. She appears well-developed and well-nourished. No distress.  Eyes: Conjunctivae and EOM are normal. Pupils are equal, round, and reactive to light.  Neck: Neck supple. No thyromegaly present.  Cardiovascular: Normal rate, regular rhythm, normal heart sounds and intact distal pulses.   No murmur heard. Pulmonary/Chest: Effort normal and breath sounds normal. No respiratory distress. She has no wheezes.  Musculoskeletal: Normal range of motion. She exhibits no edema or tenderness.  Lymphadenopathy:    She has no cervical adenopathy.  Neurological: She is alert and oriented to person, place, and time. She has normal strength. No cranial nerve deficit or sensory deficit (no sensory deficit noted in her legs and unable to elicit pain upon palpation under either foot. Pain is more an internal burning sensation.). Coordination normal.  Skin: Skin is warm and dry. No rash noted. She is not diaphoretic. No erythema.  Psychiatric: Her behavior is normal. Thought content normal. Her mood appears anxious. She exhibits a depressed mood. She expresses no suicidal ideation. She expresses no suicidal plans.  Nursing note and vitals reviewed.   Results for orders placed or performed in visit on 05/01/15  TSH  Result Value Ref Range   TSH 0.52 0.35 - 4.50 uIU/mL      Assessment & Plan:   Problem List Items Addressed This Visit      Cardiovascular and Mediastinum   Secondary hypertension - Primary   Relevant Medications   hydrochlorothiazide (HYDRODIURIL) 12.5 MG tablet     Other   Depression   Relevant Medications   LORazepam (ATIVAN)  0.5 MG tablet   sertraline (ZOLOFT) 50 MG tablet    Other Visit Diagnoses    Elevated fasting glucose        Relevant Orders    POCT glycosylated hemoglobin (Hb A1C) (Completed)    Anxiety state        Relevant Medications    LORazepam (ATIVAN) 0.5 MG tablet    sertraline (ZOLOFT) 50 MG tablet    Other polyneuropathy (HCC)        Relevant Medications    LORazepam (ATIVAN) 0.5 MG tablet    sertraline (ZOLOFT) 50 MG tablet        Follow up plan: Return in about 4 weeks (around 05/30/2015), or if symptoms worsen or fail to improve, for Depression f/u.  Counseling provided for all of the vaccine components Orders Placed This Encounter  Procedures  . POCT glycosylated hemoglobin (Hb A1C)  Caryl Pina, MD [facility] 05/02/2015, 10:19 AM

## 2015-05-30 ENCOUNTER — Ambulatory Visit: Payer: PRIVATE HEALTH INSURANCE | Admitting: Family Medicine

## 2015-06-07 ENCOUNTER — Encounter: Payer: Self-pay | Admitting: Family Medicine

## 2015-06-07 ENCOUNTER — Encounter (INDEPENDENT_AMBULATORY_CARE_PROVIDER_SITE_OTHER): Payer: Self-pay

## 2015-06-07 ENCOUNTER — Ambulatory Visit (INDEPENDENT_AMBULATORY_CARE_PROVIDER_SITE_OTHER): Payer: PRIVATE HEALTH INSURANCE | Admitting: Family Medicine

## 2015-06-07 VITALS — BP 128/72 | HR 87 | Temp 97.6°F | Ht 62.0 in | Wt 171.8 lb

## 2015-06-07 DIAGNOSIS — F329 Major depressive disorder, single episode, unspecified: Secondary | ICD-10-CM

## 2015-06-07 DIAGNOSIS — F411 Generalized anxiety disorder: Secondary | ICD-10-CM

## 2015-06-07 DIAGNOSIS — F32A Depression, unspecified: Secondary | ICD-10-CM

## 2015-06-07 MED ORDER — SERTRALINE HCL 50 MG PO TABS: 50.0000 mg | ORAL_TABLET | Freq: Every day | ORAL | Status: DC

## 2015-06-07 MED ORDER — LORAZEPAM 0.5 MG PO TABS: 0.5000 mg | ORAL_TABLET | Freq: Every day | ORAL | Status: DC | PRN

## 2015-06-07 NOTE — Progress Notes (Signed)
BP 128/72 mmHg  Pulse 87  Temp(Src) 97.6 F (36.4 C) (Oral)  Ht 5\' 2"  (1.575 m)  Wt 171 lb 12.8 oz (77.928 kg)  BMI 31.41 kg/m2  LMP  (LMP Unknown)   Subjective:    Patient ID: Destiny [name], female    DOB: Aug 11, 1965, 50 y.o.   MRN: [id number]  HPI: Destiny [name] is a 50 y.o. female presenting on 06/07/2015 for Depression   HPI Anxiety and depression follow-up Patient presents today for recheck on her anxiety and depression. She is currently taking Zoloft and Ativan as needed. She feels like the Zoloft 50 mg is doing great for her and she feels awesome and is happy and does things that she wants to do again and is sleeping better at night. She is only using the Ativan may be 2-3 times a week instead of the 2 times daily that she was before. She is very happy with her situation.  Relevant past medical, surgical, family and social history reviewed and updated as indicated. Interim medical history since our last visit reviewed. Allergies and medications reviewed and updated.  Review of Systems  Constitutional: Negative for fever and chills.  HENT: Negative for congestion, ear discharge and ear pain.   Eyes: Negative for redness and visual disturbance.  Respiratory: Negative for chest tightness and shortness of breath.   Cardiovascular: Negative for chest pain and leg swelling.  Genitourinary: Negative for dysuria and difficulty urinating.  Musculoskeletal: Negative for back pain and gait problem.  Skin: Negative for rash.  Neurological: Negative for light-headedness and headaches.  Psychiatric/Behavioral: Negative for suicidal ideas, behavioral problems, sleep disturbance, self-injury, dysphoric mood and agitation. The patient is not nervous/anxious.   All other systems reviewed and are negative.   Per HPI unless specifically indicated above     Medication List       This list is accurate as of: 06/07/15  9:04 AM.  Always use your most recent med list.               acetaminophen 500 MG tablet  Commonly known as:  TYLENOL  Take 1,000 mg by mouth every 8 (eight) hours as needed for moderate pain.     gabapentin 300 MG capsule  Commonly known as:  NEURONTIN  Take 1 tablet first day, then 1 tablet twice a day second day, then go to 1 tablet three times a day.     hydrochlorothiazide 12.5 MG tablet  Commonly known as:  HYDRODIURIL  Take 1 tablet (12.5 mg total) by mouth daily.     LORazepam 0.5 MG tablet  Commonly known as:  ATIVAN  Take 1 tablet (0.5 mg total) by mouth daily as needed for anxiety.     methimazole 10 MG tablet  Commonly known as:  TAPAZOLE  Take 1 tablet (10 mg total) by mouth 2 (two) times daily.     sertraline 50 MG tablet  Commonly known as:  ZOLOFT  Take 1 tablet (50 mg total) by mouth daily.           Objective:    BP 128/72 mmHg  Pulse 87  Temp(Src) 97.6 F (36.4 C) (Oral)  Ht 5\' 2"  (1.575 m)  Wt 171 lb 12.8 oz (77.928 kg)  BMI 31.41 kg/m2  LMP  (LMP Unknown)  Wt Readings from Last 3 Encounters:  06/07/15 171 lb 12.8 oz (77.928 kg)  05/02/15 174 lb (78.926 kg)  05/01/15 177 lb (80.287 kg)    Physical Exam  Constitutional: She is oriented to person, place, and time. She appears well-developed and well-nourished. No distress.  Eyes: Conjunctivae and EOM are normal. Pupils are equal, round, and reactive to light.  Neck: Neck supple.  Cardiovascular: Normal rate, regular rhythm, normal heart sounds and intact distal pulses.   No murmur heard. Pulmonary/Chest: Effort normal and breath sounds normal. No respiratory distress. She has no wheezes.  Musculoskeletal: Normal range of motion. She exhibits no edema or tenderness.  Lymphadenopathy:    She has no cervical adenopathy.  Neurological: She is alert and oriented to person, place, and time. Coordination normal.  Skin: Skin is warm and dry. No rash noted. She is not diaphoretic.  Psychiatric: She has a normal mood and affect. Her behavior is normal. Judgment  and thought content normal.  Nursing note and vitals reviewed.   Results for orders placed or performed in visit on 05/02/15  POCT glycosylated hemoglobin (Hb A1C)  Result Value Ref Range   Hemoglobin A1C 5.5       Assessment & Plan:   Problem List Items Addressed This Visit      Other   Depression - Primary    Very satisfied with where she is at currently, will refill medications and see her back in 3 months.      Relevant Medications   LORazepam (ATIVAN) 0.5 MG tablet   sertraline (ZOLOFT) 50 MG tablet    Other Visit Diagnoses    Anxiety state        Relevant Medications    LORazepam (ATIVAN) 0.5 MG tablet    sertraline (ZOLOFT) 50 MG tablet        Follow up plan: Return in about 3 months (around 09/05/2015), or if symptoms worsen or fail to improve, for Anxiety and depression.  Counseling provided for all of the vaccine components No orders of the defined types were placed in this encounter.    Caryl Pina, MD [facility] 06/07/2015, 9:04 AM

## 2015-06-07 NOTE — Assessment & Plan Note (Signed)
Very satisfied with where she is at currently, will refill medications and see her back in 3 months.

## 2015-07-01 ENCOUNTER — Encounter: Payer: Self-pay | Admitting: Endocrinology

## 2015-07-01 ENCOUNTER — Ambulatory Visit (INDEPENDENT_AMBULATORY_CARE_PROVIDER_SITE_OTHER): Payer: BLUE CROSS/BLUE SHIELD | Admitting: Endocrinology

## 2015-07-01 VITALS — BP 143/70 | HR 90 | Temp 98.2°F | Ht 62.0 in | Wt 175.0 lb

## 2015-07-01 DIAGNOSIS — E0590 Thyrotoxicosis, unspecified without thyrotoxic crisis or storm: Secondary | ICD-10-CM

## 2015-07-01 LAB — TSH: TSH: 0.14 u[IU]/mL — ABNORMAL LOW (ref 0.35–4.50)

## 2015-07-01 LAB — T4, FREE: Free T4: 2.57 ng/dL — ABNORMAL HIGH (ref 0.60–1.60)

## 2015-07-01 NOTE — Progress Notes (Signed)
   Subjective:    Patient ID: Destiny [name], female    DOB: 1965/12/26, 50 y.o.   MRN: [id number]  HPI Pt returns for f/u of hyperthyroidism (dx'ed 2016; probably due to [place] dz; she has never had dedicated thyroid imaging; tapazole was chosen as initial rx, due to severity, but she chooses to continue this as long-term rx).  pt states she feels well in general.  She says she never misses the tapazole.   Past Medical History  Diagnosis Date  . Hypertension   . Depression     Past Surgical History  Procedure Laterality Date  . Eye surgery      Social History   Social History  . Marital Status: Divorced    Spouse Name: N/A  . Number of Children: N/A  . Years of Education: N/A   Occupational History  . Not on file.   Social History Main Topics  . Smoking status: Never Smoker   . Smokeless tobacco: Not on file  . Alcohol Use: No  . Drug Use: No  . Sexual Activity: Yes    Birth Control/ Protection: Surgical   Other Topics Concern  . Not on file   Social History Narrative    Current Outpatient Prescriptions on File Prior to Visit  Medication Sig Dispense Refill  . acetaminophen (TYLENOL) 500 MG tablet Take 1,000 mg by mouth every 8 (eight) hours as needed for moderate pain.    Marland Kitchen gabapentin (NEURONTIN) 300 MG capsule Take 1 tablet first day, then 1 tablet twice a day second day, then go to 1 tablet three times a day. 93 capsule 0  . hydrochlorothiazide (HYDRODIURIL) 12.5 MG tablet Take 1 tablet (12.5 mg total) by mouth daily. 90 tablet 3  . LORazepam (ATIVAN) 0.5 MG tablet Take 1 tablet (0.5 mg total) by mouth daily as needed for anxiety. 30 tablet 0  . sertraline (ZOLOFT) 50 MG tablet Take 1 tablet (50 mg total) by mouth daily. 30 tablet 3   No current facility-administered medications on file prior to visit.    No Known Allergies  Family History  Problem Relation Age of Onset  . Thyroid disease Mother   . Cancer Father     lung  . Cirrhosis Brother     BP  143/70 mmHg  Pulse 90  Temp(Src) 98.2 F (36.8 C) (Oral)  Ht 5\' 2"  (1.575 m)  Wt 175 lb (79.379 kg)  BMI 32.00 kg/m2  SpO2 97%  LMP  (LMP Unknown)  Review of Systems Denies fever    Objective:   Physical Exam VITAL SIGNS:  See vs page GENERAL: no distress NECK: thyroid is approx 4-5 times normal size, diffuse Skin: not diaphoretic Neuro: no tremor  Lab Results  Component Value Date   TSH 0.14* 07/01/2015   T4TOTAL 32.3* 02/18/2015       Assessment & Plan:  Hyperthyroidism: she needs increased rx  Patient is advised the following: Patient Instructions  Thyroid blood tests are requested for you today.  We'll let you know about the results.   if ever you have fever while taking methimazole, stop it and call us, even if the reason is obvious, because of the risk of a rare side-effect.   Please come back for a follow-up appointment in 3-4 months.     addendum: i have sent a prescription to your pharmacy, to increase the methimazole.

## 2015-07-01 NOTE — Patient Instructions (Addendum)
Thyroid blood tests are requested for you today.  We'll let you know about the results.   if ever you have fever while taking methimazole, stop it and call us, even if the reason is obvious, because of the risk of a rare side-effect.   Please come back for a follow-up appointment in 3-4 months.

## 2015-07-02 MED ORDER — METHIMAZOLE 10 MG PO TABS: 20.0000 mg | ORAL_TABLET | Freq: Two times a day (BID) | ORAL | Status: DC

## 2015-08-14 ENCOUNTER — Encounter: Payer: Self-pay | Admitting: Family

## 2015-08-14 ENCOUNTER — Ambulatory Visit (INDEPENDENT_AMBULATORY_CARE_PROVIDER_SITE_OTHER): Payer: BLUE CROSS/BLUE SHIELD | Admitting: Family

## 2015-08-14 VITALS — BP 133/73 | HR 104 | Temp 100.8°F | Ht 62.0 in | Wt 177.0 lb

## 2015-08-14 DIAGNOSIS — R6889 Other general symptoms and signs: Secondary | ICD-10-CM

## 2015-08-14 DIAGNOSIS — J101 Influenza due to other identified influenza virus with other respiratory manifestations: Secondary | ICD-10-CM

## 2015-08-14 LAB — VERITOR FLU A/B WAIVED
Influenza A: POSITIVE — AB
Influenza B: NEGATIVE

## 2015-08-14 MED ORDER — BENZONATATE 200 MG PO CAPS: 200.0000 mg | ORAL_CAPSULE | Freq: Three times a day (TID) | ORAL | Status: DC | PRN

## 2015-08-14 MED ORDER — HYDROCODONE-HOMATROPINE 5-1.5 MG/5ML PO SYRP: 5.0000 mL | ORAL_SOLUTION | Freq: Three times a day (TID) | ORAL | Status: DC | PRN

## 2015-08-14 MED ORDER — OSELTAMIVIR PHOSPHATE 75 MG PO CAPS: 75.0000 mg | ORAL_CAPSULE | Freq: Two times a day (BID) | ORAL | Status: DC

## 2015-08-14 NOTE — Progress Notes (Signed)
   Subjective:    Patient ID: Destiny [name], female    DOB: Nov 21, 1965, 50 y.o.   MRN: [id number]  Fever  This is a new problem. The current episode started yesterday. The problem occurs constantly. The problem has been unchanged. The maximum temperature noted was 101 to 101.9 F. Associated symptoms include congestion, coughing, headaches, muscle aches and nausea. Pertinent negatives include no chest pain, diarrhea, sore throat, vomiting or wheezing. She has tried acetaminophen and NSAIDs for the symptoms. The treatment provided mild relief.      Review of Systems  Constitutional: Positive for fever.  HENT: Positive for congestion. Negative for sore throat.   Eyes: Negative.   Respiratory: Positive for cough. Negative for shortness of breath and wheezing.   Cardiovascular: Negative.  Negative for chest pain and palpitations.  Gastrointestinal: Positive for nausea. Negative for vomiting and diarrhea.  Endocrine: Negative.   Genitourinary: Negative.   Musculoskeletal: Negative.   Neurological: Positive for headaches.  Hematological: Negative.   Psychiatric/Behavioral: Negative.   All other systems reviewed and are negative.      Objective:   Physical Exam  Constitutional: She is oriented to person, place, and time. She appears well-developed and well-nourished. No distress.  HENT:  Head: Normocephalic and atraumatic.  Right Ear: External ear normal.  Left Ear: External ear normal.  Mouth/Throat: Oropharynx is clear and moist.  Nasal passage erythemas with mild swelling    Eyes: Pupils are equal, round, and reactive to light.  Neck: Normal range of motion. Neck supple. No thyromegaly present.  Cardiovascular: Normal rate, regular rhythm, normal heart sounds and intact distal pulses.   No murmur heard. Pulmonary/Chest: Effort normal and breath sounds normal. No respiratory distress. She has no wheezes.  Intermittent tight nonproductive cough   Abdominal: Soft. Bowel sounds are  normal. She exhibits no distension. There is no tenderness.  Musculoskeletal: Normal range of motion. She exhibits no edema or tenderness.  Neurological: She is alert and oriented to person, place, and time. She has normal reflexes. No cranial nerve deficit.  Skin: Skin is warm and dry.  Psychiatric: She has a normal mood and affect. Her behavior is normal. Judgment and thought content normal.  Vitals reviewed.   BP 133/73 mmHg  Pulse 104  Temp(Src) 100.8 F (38.2 C) (Oral)  Ht 5\' 2"  (1.575 m)  Wt 177 lb (80.287 kg)  BMI 32.37 kg/m2  LMP  (LMP Unknown)       Assessment & Plan:  1. Flu-like symptoms  - Veritor Flu A/B Waived  2. Influenza A -Rest -Force fluids -Tylenol or motrin prn for pain or fever -Good hand hygiene discussed -RTO prn  - oseltamivir (TAMIFLU) 75 MG capsule; Take 1 capsule (75 mg total) by mouth 2 (two) times daily.  Dispense: 10 capsule; Refill: 0 - benzonatate (TESSALON) 200 MG capsule; Take 1 capsule (200 mg total) by mouth 3 (three) times daily as needed.  Dispense: 30 capsule; Refill: 1 - HYDROcodone-homatropine (HYCODAN) 5-1.5 MG/5ML syrup; Take 5 mLs by mouth every 8 (eight) hours as needed for cough.  Dispense: 120 mL; Refill: 0  Evelina Dun, FNP

## 2015-08-14 NOTE — Patient Instructions (Signed)

## 2015-08-23 ENCOUNTER — Other Ambulatory Visit: Payer: Self-pay | Admitting: Family Medicine

## 2015-08-26 NOTE — Telephone Encounter (Signed)
We will go ahead and print.

## 2015-08-26 NOTE — Telephone Encounter (Signed)
Last seen 08/14/15  Dr Dettinger   If approved route to nurse to call into [facility]

## 2015-09-02 ENCOUNTER — Encounter: Payer: Self-pay | Admitting: Family Medicine

## 2015-09-02 ENCOUNTER — Ambulatory Visit (INDEPENDENT_AMBULATORY_CARE_PROVIDER_SITE_OTHER): Payer: BLUE CROSS/BLUE SHIELD | Admitting: Family Medicine

## 2015-09-02 ENCOUNTER — Encounter: Payer: Self-pay | Admitting: Gastroenterology

## 2015-09-02 VITALS — BP 138/70 | HR 92 | Temp 97.7°F | Ht 62.0 in | Wt 179.8 lb

## 2015-09-02 DIAGNOSIS — I10 Essential (primary) hypertension: Secondary | ICD-10-CM

## 2015-09-02 DIAGNOSIS — E669 Obesity, unspecified: Secondary | ICD-10-CM | POA: Diagnosis not present

## 2015-09-02 DIAGNOSIS — K921 Melena: Secondary | ICD-10-CM

## 2015-09-02 DIAGNOSIS — F329 Major depressive disorder, single episode, unspecified: Secondary | ICD-10-CM | POA: Diagnosis not present

## 2015-09-02 DIAGNOSIS — F32A Depression, unspecified: Secondary | ICD-10-CM

## 2015-09-02 DIAGNOSIS — E1159 Type 2 diabetes mellitus with other circulatory complications: Secondary | ICD-10-CM | POA: Insufficient documentation

## 2015-09-02 MED ORDER — LIRAGLUTIDE -WEIGHT MANAGEMENT 18 MG/3ML ~~LOC~~ SOPN: 1.0000 | PEN_INJECTOR | Freq: Every day | SUBCUTANEOUS | Status: DC

## 2015-09-02 NOTE — Assessment & Plan Note (Signed)
Patient would like to try something for weight loss, will try to see if she can get Saxenda.

## 2015-09-02 NOTE — Assessment & Plan Note (Signed)
Happy with Zoloft and Ativan, recommended not to take Ativan every day so she doesn't become dependent on it.

## 2015-09-02 NOTE — Assessment & Plan Note (Signed)
Controlled, continue medications 

## 2015-09-02 NOTE — Progress Notes (Signed)
BP 138/70 mmHg  Pulse 92  Temp(Src) 97.7 F (36.5 C) (Oral)  Ht 5\' 2"  (1.575 m)  Wt 179 lb 12.8 oz (81.557 kg)  BMI 32.88 kg/m2  LMP  (LMP Unknown)   Subjective:    Patient ID: Destiny [name], female    DOB: 06-06-1965, 50 y.o.   MRN: [id number]  HPI: Destiny [name] is a 50 y.o. female presenting on 09/02/2015 for Blood In Stools; Hypertension; and Discuss weight loss   HPI Hypertension recheck Patient is coming in today for hypertension recheck. Her blood pressure today is 130/70. She is currently on hydrochlorothiazide 12.5 mg. Patient denies headaches, blurred vision, chest pains, shortness of breath, or weakness. Denies any side effects from medication and is content with current medication.   Blood in stool Patient has been getting bright red blood per stool.She has had hemorrhoids before and knows what they are and thinks it may be that but she is also getting close to the age where she  would need a colonoscopy anyways and wants to see about doing that. She does have some tenderness when she sits and some tenderness with bowel movements down in her anal region. She denies any lightheadedness or dizziness.  Depression Patient is currently on Zoloft and Ativan. She has started getting closer to taking the Ativan almost every day and recommended that she not do that so she doesn't become dependent on it. She denies any suicidal ideations or thoughts herself. She denies any major issues with depression but does have some anxiety. She denies any sleep issues currently and has been doing well with that.  Obesity Patient has a BMI greater than 32 and has been having issues with weight and obesity for quite sometime to her life. She would like to look into changing that and possibly going to see somebody to talk about options.  Relevant past medical, surgical, family and social history reviewed and updated as indicated. Interim medical history since our last visit reviewed. Allergies and  medications reviewed and updated.  Review of Systems  Constitutional: Negative for fever and chills.  HENT: Negative for congestion, ear discharge and ear pain.   Eyes: Negative for redness and visual disturbance.  Respiratory: Negative for chest tightness and shortness of breath.   Cardiovascular: Negative for chest pain and leg swelling.  Gastrointestinal: Positive for anal bleeding and rectal pain. Negative for nausea, vomiting, abdominal pain, diarrhea and constipation.  Genitourinary: Negative for dysuria and difficulty urinating.  Musculoskeletal: Negative for back pain and gait problem.  Skin: Negative for rash.  Neurological: Negative for light-headedness and headaches.  Psychiatric/Behavioral: Positive for dysphoric mood. Negative for suicidal ideas, behavioral problems, sleep disturbance, self-injury and agitation. The patient is nervous/anxious.   All other systems reviewed and are negative.   Per HPI unless specifically indicated above     Medication List       This list is accurate as of: 09/02/15  1:41 PM.  Always use your most recent med list.               acetaminophen 500 MG tablet  Commonly known as:  TYLENOL  Take 1,000 mg by mouth every 8 (eight) hours as needed for moderate pain.     benzonatate 200 MG capsule  Commonly known as:  TESSALON  Take 1 capsule (200 mg total) by mouth 3 (three) times daily as needed.     gabapentin 300 MG capsule  Commonly known as:  NEURONTIN  Take 1 tablet  first day, then 1 tablet twice a day second day, then go to 1 tablet three times a day.     hydrochlorothiazide 12.5 MG tablet  Commonly known as:  HYDRODIURIL  Take 1 tablet (12.5 mg total) by mouth daily.     Liraglutide -Weight Management 18 MG/3ML Sopn  Commonly known as:  SAXENDA  Inject 1 Dose into the skin daily. 0.6 mg once daily for one week; increase by 0.6 mg every week to a target of 3 mg once daily     LORazepam 0.5 MG tablet  Commonly known as:   ATIVAN  TAKE  (1)  TABLET TWICE A DAY.     methimazole 10 MG tablet  Commonly known as:  TAPAZOLE  Take 2 tablets (20 mg total) by mouth 2 (two) times daily.     PREDNISONE (PAK) PO  Take by mouth.     sertraline 50 MG tablet  Commonly known as:  ZOLOFT  Take 1 tablet (50 mg total) by mouth daily.           Objective:    BP 138/70 mmHg  Pulse 92  Temp(Src) 97.7 F (36.5 C) (Oral)  Ht 5\' 2"  (1.575 m)  Wt 179 lb 12.8 oz (81.557 kg)  BMI 32.88 kg/m2  LMP  (LMP Unknown)  Wt Readings from Last 3 Encounters:  09/02/15 179 lb 12.8 oz (81.557 kg)  08/14/15 177 lb (80.287 kg)  07/01/15 175 lb (79.379 kg)    Physical Exam  Constitutional: She is oriented to person, place, and time. She appears well-developed and well-nourished. No distress.  HENT:  Right Ear: External ear normal.  Left Ear: External ear normal.  Nose: Nose normal.  Mouth/Throat: Oropharynx is clear and moist.  Eyes: Conjunctivae and EOM are normal. Pupils are equal, round, and reactive to light.  Neck: Neck supple. No thyromegaly present.  Cardiovascular: Normal rate, regular rhythm, normal heart sounds and intact distal pulses.   No murmur heard. Pulmonary/Chest: Effort normal and breath sounds normal. No respiratory distress. She has no wheezes.  Abdominal: Soft. Bowel sounds are normal. She exhibits no distension. There is no tenderness. There is no rebound.  Musculoskeletal: Normal range of motion. She exhibits no edema or tenderness.  Lymphadenopathy:    She has no cervical adenopathy.  Neurological: She is alert and oriented to person, place, and time. Coordination normal.  Skin: Skin is warm and dry. No rash noted. She is not diaphoretic.  Psychiatric: She has a normal mood and affect. Her behavior is normal.  Nursing note and vitals reviewed.     Assessment & Plan:   Problem List Items Addressed This Visit      Cardiovascular and Mediastinum   Essential hypertension, benign - Primary     Controlled, continue medications        Other   Depression    Happy with Zoloft and Ativan, recommended not to take Ativan every day so she doesn't become dependent on it.      Obesity    Patient would like to try something for weight loss, will try to see if she can get Saxenda.      Relevant Medications   Liraglutide -Weight Management (SAXENDA) 18 MG/3ML SOPN   Other Relevant Orders   Amb ref to Medical Nutrition Therapy-MNT    Other Visit Diagnoses    Blood in stool        Likely hemorrhoids, but also getting to be the age where she needed a screening colonoscopy  anyways. We'll send to GI    Relevant Orders    Ambulatory referral to Gastroenterology       Follow up plan: Return in about 4 weeks (around 09/30/2015), or if symptoms worsen or fail to improve, for Weight recheck.  Counseling provided for all of the vaccine components Orders Placed This Encounter  Procedures  . Ambulatory referral to Gastroenterology  . Amb ref to [place], MD [facility] 09/02/2015, 1:41 PM

## 2015-09-05 ENCOUNTER — Telehealth: Payer: Self-pay

## 2015-09-05 MED ORDER — NALTREXONE-BUPROPION HCL ER 8-90 MG PO TB12: ORAL_TABLET | ORAL | Status: DC

## 2015-09-05 NOTE — Telephone Encounter (Signed)
Tried to send a medication called Contrave as I don't like either of those options that insurance gave.

## 2015-09-05 NOTE — Telephone Encounter (Signed)
Patient called to find out if we had sent in another medication for weight loss.  I informed patient that Dr. Warrick Parisian had sent in a prescription for Contrave to her pharmacy.

## 2015-09-05 NOTE — Telephone Encounter (Signed)
Insurance denied Saxenda   Must try Belviq and Qysmia

## 2015-09-10 NOTE — Telephone Encounter (Signed)
Patient notified

## 2015-09-11 ENCOUNTER — Telehealth: Payer: Self-pay | Admitting: Family Medicine

## 2015-09-11 NOTE — Telephone Encounter (Signed)
Seen pt 4/3 for internal Hemorids. She has an apt with Patsey Berthold on  May 26th and is still having a lot of bleeding. Patient would like to know if anything that will help until her apt in May. Please advise

## 2015-09-12 ENCOUNTER — Telehealth: Payer: Self-pay

## 2015-09-12 MED ORDER — HYDROCORTISONE ACETATE 25 MG RE SUPP: 25.0000 mg | Freq: Two times a day (BID) | RECTAL | Status: DC

## 2015-09-12 MED ORDER — PHENTERMINE-TOPIRAMATE ER 3.75-23 MG PO CP24: 1.0000 | ORAL_CAPSULE | Freq: Every day | ORAL | Status: DC

## 2015-09-12 MED ORDER — PHENTERMINE-TOPIRAMATE ER 7.5-46 MG PO CP24: 1.0000 | ORAL_CAPSULE | Freq: Every day | ORAL | Status: DC

## 2015-09-12 NOTE — Telephone Encounter (Signed)
Pt notified of RX RX to front for pt pick up 

## 2015-09-12 NOTE — Telephone Encounter (Signed)
Insurance denied Contrave   Alternatives are Belviq and Qsymia

## 2015-09-12 NOTE — Telephone Encounter (Signed)
Pt notified of recommendation Verbalizes understanding 

## 2015-09-12 NOTE — Telephone Encounter (Signed)
Printed out Qsymia, she will start with the lower dose for the first 2 weeks and then continue the higher dose for the next couple weeks. This is a short course of weight loss medication only we can only usually do this for 3-4 months and we will monitor her closely for blood pressure or heart changes every month. She will need to come in in 4 weeks and have weight check and blood pressure check and a visit. Caryl Pina, MD [facility] 09/12/2015, 9:52 AM

## 2015-09-12 NOTE — Telephone Encounter (Signed)
Give Anusol suppository

## 2015-09-18 ENCOUNTER — Telehealth: Payer: Self-pay

## 2015-09-18 NOTE — Telephone Encounter (Signed)
Insurance prior authorized Qsymia  

## 2015-09-27 ENCOUNTER — Ambulatory Visit: Payer: No Typology Code available for payment source | Admitting: Endocrinology

## 2015-09-30 ENCOUNTER — Encounter: Payer: Self-pay | Admitting: Endocrinology

## 2015-09-30 ENCOUNTER — Ambulatory Visit (INDEPENDENT_AMBULATORY_CARE_PROVIDER_SITE_OTHER): Payer: BLUE CROSS/BLUE SHIELD | Admitting: Endocrinology

## 2015-09-30 ENCOUNTER — Other Ambulatory Visit: Payer: Self-pay | Admitting: *Deleted

## 2015-09-30 VITALS — BP 146/70 | HR 80 | Temp 98.0°F | Wt 184.0 lb

## 2015-09-30 DIAGNOSIS — E0590 Thyrotoxicosis, unspecified without thyrotoxic crisis or storm: Secondary | ICD-10-CM

## 2015-09-30 NOTE — Progress Notes (Signed)
Subjective:    Patient ID: Destiny [name], female    DOB: Oct 23, 1965, 50 y.o.   MRN: [id number]  HPI Pt returns for f/u of hyperthyroidism (dx'ed 2016; probably due to [place] dz; she has never had dedicated thyroid imaging; tapazole was chosen as initial rx, due to severity, but she chooses to continue this as long-term rx).  pt states she feels well in general.  She says she seldom misses the tapazole.   Past Medical History  Diagnosis Date  . Hypertension   . Depression     Past Surgical History  Procedure Laterality Date  . Eye surgery      Social History   Social History  . Marital Status: Divorced    Spouse Name: N/A  . Number of Children: N/A  . Years of Education: N/A   Occupational History  . Not on file.   Social History Main Topics  . Smoking status: Never Smoker   . Smokeless tobacco: Not on file  . Alcohol Use: No  . Drug Use: No  . Sexual Activity: Yes    Birth Control/ Protection: Surgical   Other Topics Concern  . Not on file   Social History Narrative    Current Outpatient Prescriptions on File Prior to Visit  Medication Sig Dispense Refill  . acetaminophen (TYLENOL) 500 MG tablet Take 1,000 mg by mouth every 8 (eight) hours as needed for moderate pain.    . benzonatate (TESSALON) 200 MG capsule Take 1 capsule (200 mg total) by mouth 3 (three) times daily as needed. 30 capsule 1  . gabapentin (NEURONTIN) 300 MG capsule Take 1 tablet first day, then 1 tablet twice a day second day, then go to 1 tablet three times a day. (Patient taking differently: 300 mg 3 (three) times daily. ) 93 capsule 0  . hydrochlorothiazide (HYDRODIURIL) 12.5 MG tablet Take 1 tablet (12.5 mg total) by mouth daily. 90 tablet 3  . hydrocortisone (ANUSOL-HC) 25 MG suppository Place 1 suppository (25 mg total) rectally 2 (two) times daily. 12 suppository 0  . LORazepam (ATIVAN) 0.5 MG tablet TAKE  (1)  TABLET TWICE A DAY. 60 tablet 1  . methimazole (TAPAZOLE) 10 MG tablet Take 2  tablets (20 mg total) by mouth 2 (two) times daily. 120 tablet 5  . Phentermine-Topiramate 3.75-23 MG CP24 Take 1 tablet by mouth daily. 14 capsule 0  . Phentermine-Topiramate 7.5-46 MG CP24 Take 1 tablet by mouth daily. 16 capsule 0  . PREDNISONE, PAK, PO Take by mouth.    . sertraline (ZOLOFT) 50 MG tablet Take 1 tablet (50 mg total) by mouth daily. 30 tablet 3   No current facility-administered medications on file prior to visit.    No Known Allergies  Family History  Problem Relation Age of Onset  . Thyroid disease Mother   . Cancer Father     lung  . Cirrhosis Brother     BP 146/70 mmHg  Pulse 80  Temp(Src) 98 F (36.7 C)  Wt 184 lb (83.462 kg)  SpO2 99%  LMP  (LMP Unknown)  Review of Systems Denies fever.     Objective:   Physical Exam VITAL SIGNS:  See vs page GENERAL: no distress NECK: thyroid is approx 4-5 times normal size, diffuse.  Skin: not diaphoretic.  Neuro: no tremor.      Assessment & Plan:  Hyperthyroidism: due for recheck  Patient is advised the following: Patient Instructions  Thyroid blood tests are requested for you today.  We'll let you know about the results.   if ever you have fever while taking methimazole, stop it and call us, even if the reason is obvious, because of the risk of a rare side-effect.   Please come back for a follow-up appointment in 4 months.

## 2015-09-30 NOTE — Patient Instructions (Addendum)
Thyroid blood tests are requested for you today.  We'll let you know about the results. if ever you have fever while taking methimazole, stop it and call us, even if the reason is obvious, because of the risk of a rare side-effect. Please come back for a follow-up appointment in 4 months.    

## 2015-10-01 ENCOUNTER — Other Ambulatory Visit: Payer: BLUE CROSS/BLUE SHIELD

## 2015-10-01 ENCOUNTER — Other Ambulatory Visit: Payer: Self-pay

## 2015-10-01 DIAGNOSIS — D649 Anemia, unspecified: Secondary | ICD-10-CM | POA: Insufficient documentation

## 2015-10-01 DIAGNOSIS — E0590 Thyrotoxicosis, unspecified without thyrotoxic crisis or storm: Secondary | ICD-10-CM

## 2015-10-02 ENCOUNTER — Telehealth: Payer: Self-pay | Admitting: Family Medicine

## 2015-10-02 ENCOUNTER — Other Ambulatory Visit: Payer: Self-pay | Admitting: Endocrinology

## 2015-10-02 LAB — ANEMIA PROFILE B
BASOS: 0 %
Basophils Absolute: 0 10*3/uL (ref 0.0–0.2)
EOS (ABSOLUTE): 0.2 10*3/uL (ref 0.0–0.4)
EOS: 3 %
FERRITIN: 30 ng/mL (ref 15–150)
Folate: >20 ng/mL (ref >3.0)
HEMATOCRIT: 32 % — AB (ref 34.0–46.6)
HEMOGLOBIN: 11.3 g/dL (ref 11.1–15.9)
IMMATURE GRANS (ABS): 0 10*3/uL (ref 0.0–0.1)
IMMATURE GRANULOCYTES: 0 %
IRON SATURATION: 13 % — AB (ref 15–55)
IRON: 44 ug/dL (ref 27–159)
LYMPHS: 23 %
Lymphocytes Absolute: 1.6 10*3/uL (ref 0.7–3.1)
MCH: 26.7 pg (ref 26.6–33.0)
MCHC: 35.3 g/dL (ref 31.5–35.7)
MCV: 76 fL — ABNORMAL LOW (ref 79–97)
Monocytes Absolute: 0.6 10*3/uL (ref 0.1–0.9)
Monocytes: 9 %
NEUTROS PCT: 65 %
Neutrophils Absolute: 4.5 10*3/uL (ref 1.4–7.0)
Platelets: 354 10*3/uL (ref 150–379)
RBC: 4.24 x10E6/uL (ref 3.77–5.28)
RDW: 12.9 % (ref 12.3–15.4)
RETIC CT PCT: 1.3 % (ref 0.6–2.6)
TIBC: 337 ug/dL (ref 250–450)
UIBC: 293 ug/dL (ref 131–425)
Vitamin B-12: 306 pg/mL (ref 211–946)
WBC: 7 10*3/uL (ref 3.4–10.8)

## 2015-10-02 LAB — T4, FREE: Free T4: 2.24 ng/dL — ABNORMAL HIGH (ref 0.82–1.77)

## 2015-10-02 LAB — TSH

## 2015-10-02 MED ORDER — METHIMAZOLE 10 MG PO TABS: 40.0000 mg | ORAL_TABLET | Freq: Two times a day (BID) | ORAL | Status: DC

## 2015-10-02 NOTE — Telephone Encounter (Signed)
Spoke with patient regarding lab work.

## 2015-10-03 ENCOUNTER — Other Ambulatory Visit: Payer: Self-pay

## 2015-10-25 ENCOUNTER — Encounter: Payer: Self-pay | Admitting: Gastroenterology

## 2015-10-25 ENCOUNTER — Ambulatory Visit (INDEPENDENT_AMBULATORY_CARE_PROVIDER_SITE_OTHER): Payer: BLUE CROSS/BLUE SHIELD | Admitting: Gastroenterology

## 2015-10-25 VITALS — BP 134/72 | HR 70 | Ht 61.5 in | Wt 194.6 lb

## 2015-10-25 DIAGNOSIS — K625 Hemorrhage of anus and rectum: Secondary | ICD-10-CM

## 2015-10-25 DIAGNOSIS — R197 Diarrhea, unspecified: Secondary | ICD-10-CM

## 2015-10-25 DIAGNOSIS — Z1211 Encounter for screening for malignant neoplasm of colon: Secondary | ICD-10-CM

## 2015-10-25 DIAGNOSIS — R32 Unspecified urinary incontinence: Secondary | ICD-10-CM

## 2015-10-25 MED ORDER — NA SULFATE-K SULFATE-MG SULF 17.5-3.13-1.6 GM/177ML PO SOLN: 1.0000 | Freq: Once | ORAL | Status: DC

## 2015-10-25 NOTE — Patient Instructions (Signed)
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
## 2015-10-26 ENCOUNTER — Telehealth: Payer: Self-pay | Admitting: Gastroenterology

## 2015-10-26 NOTE — Telephone Encounter (Signed)
Her pharmacy explained her insurance will not cover the suprep.  I verified this with her pharmacy.  She is instead going to use gatorade, miralax split dose type prep using the times written on her prep instructions.

## 2015-10-29 ENCOUNTER — Ambulatory Visit (AMBULATORY_SURGERY_CENTER): Payer: BLUE CROSS/BLUE SHIELD | Admitting: Gastroenterology

## 2015-10-29 ENCOUNTER — Encounter: Payer: Self-pay | Admitting: Gastroenterology

## 2015-10-29 VITALS — BP 121/67 | HR 65 | Temp 99.3°F | Resp 19 | Ht 61.5 in | Wt 194.0 lb

## 2015-10-29 DIAGNOSIS — C20 Malignant neoplasm of rectum: Secondary | ICD-10-CM

## 2015-10-29 DIAGNOSIS — D122 Benign neoplasm of ascending colon: Secondary | ICD-10-CM | POA: Diagnosis not present

## 2015-10-29 DIAGNOSIS — R197 Diarrhea, unspecified: Secondary | ICD-10-CM

## 2015-10-29 DIAGNOSIS — K625 Hemorrhage of anus and rectum: Secondary | ICD-10-CM

## 2015-10-29 HISTORY — DX: Malignant neoplasm of rectum: C20

## 2015-10-29 MED ORDER — SODIUM CHLORIDE 0.9 % IV SOLN: 500.0000 mL | INTRAVENOUS | Status: DC

## 2015-10-29 NOTE — Progress Notes (Signed)
Stable to RR 

## 2015-10-29 NOTE — Op Note (Signed)
[facility] Patient Name: Destiny [name] Procedure Date: 10/29/2015 3:30 PM MRN: [id number] Endoscopist: Remo Lipps P. Havery Moros , MD Age: 50 Referring MD:  Date of Birth: Oct 29, 1965 Gender: Female Procedure:                Colonoscopy Indications:              This is the patient's first colonoscopy, Clinically                            significant diarrhea of unexplained origin,                            Hematochezia Medicines:                Monitored Anesthesia Care Procedure:                Pre-Anesthesia Assessment:                           - Prior to the procedure, a History and Physical                            was performed, and patient medications and                            allergies were reviewed. The patient's tolerance of                            previous anesthesia was also reviewed. The risks                            and benefits of the procedure and the sedation                            options and risks were discussed with the patient.                            All questions were answered, and informed consent                            was obtained. Prior Anticoagulants: The patient has                            taken no previous anticoagulant or antiplatelet                            agents. ASA Grade Assessment: II - A patient with                            mild systemic disease. After reviewing the risks                            and benefits, the patient was deemed in  satisfactory condition to undergo the procedure.                           After obtaining informed consent, the colonoscope                            was passed under direct vision. Throughout the                            procedure, the patient's blood pressure, pulse, and                            oxygen saturations were monitored continuously. The                            Model PCF-H190DL [id number]) scope was introduced          through the anus and advanced to the the terminal                            ileum, with identification of the appendiceal                            orifice and IC valve. The colonoscopy was performed                            without difficulty. The patient tolerated the                            procedure well. The quality of the bowel                            preparation was adequate. The terminal ileum,                            ileocecal valve, appendiceal orifice, and rectum                            were photographed. Scope In: 3:40:48 PM Scope Out: 4:01:38 PM Scope Withdrawal Time: 0 hours 18 minutes 29 seconds  Total Procedure Duration: 0 hours 20 minutes 50 seconds  Findings:                 The perianal exam findings include mass / nodule.                           A 3 mm polyp was found in the ascending colon. The                            polyp was sessile. The polyp was removed with a                            cold biopsy forceps. Resection and retrieval were  complete.                           Multiple small-mouthed diverticula were found in                            the left colon.                           The terminal ileum appeared normal.                           An ulcerated non-obstructing large mass was found                            in the rectum. The mass was within 2 cm or so from                            the anal verge, and most superior portion around                            8-10cm from the anal verge. The mass was partially                            circumferential (involving one-half of the lumen                            circumference). This was biopsied with a cold                            forceps for histology. Areas both proximal to the                            mass and lateral to the mass were tattooed with an                            injection of Spot (carbon black).                            The exam was otherwise without abnormality.                           Biopsies for histology were taken with a cold                            forceps from the right colon and left colon for                            evaluation of microscopic colitis. Complications:            No immediate complications. Estimated blood loss:                            Minimal. Estimated Blood Loss:     Estimated blood loss was minimal.  Impression:               - Mass / nodule found on digital rectal exam.                           - One 3 mm polyp in the ascending colon, removed                            with a cold biopsy forceps. Resected and retrieved.                           - Diverticulosis in the left colon.                           - The examined portion of the ileum was normal.                           - Likely malignant tumor in the rectum. Biopsied.                            Tattooed.                           - The examination was otherwise normal.                           - Biopsies were taken with a cold forceps from the                            right colon and left colon for evaluation of                            microscopic colitis. Recommendation:           - Patient has a contact number available for                            emergencies. The signs and symptoms of potential                            delayed complications were discussed with the                            patient. Return to normal activities tomorrow.                            Written discharge instructions were provided to the                            patient.                           - Resume previous diet.                           - Continue present medications.                           -  Await pathology results.                           - You will be contacted as soon as possible                            regarding the results of this pathology. If the                            lesion is  determined to be a cancerous growth,                            imaging with a CT scan of the chest, abdomen, and                            pelvis will be recommended                           - General surgery consultation Destiny [name]. Destiny Violante, MD 10/29/2015 4:09:41 PM This report has been signed electronically.

## 2015-10-29 NOTE — Patient Instructions (Signed)
YOU HAD AN ENDOSCOPIC PROCEDURE TODAY AT [facility]:   Refer to the procedure report that was given to you for any specific questions about what was found during the examination.  If the procedure report does not answer your questions, please call your gastroenterologist to clarify.  If you requested that your care partner not be given the details of your procedure findings, then the procedure report has been included in a sealed envelope for you to review at your convenience later.  YOU SHOULD EXPECT: Some feelings of bloating in the abdomen. Passage of more gas than usual.  Walking can help get rid of the air that was put into your GI tract during the procedure and reduce the bloating. If you had a lower endoscopy (such as a colonoscopy or flexible sigmoidoscopy) you may notice spotting of blood in your stool or on the toilet paper. If you underwent a bowel prep for your procedure, you may not have a normal bowel movement for a few days.  Please Note:  You might notice some irritation and congestion in your nose or some drainage.  This is from the oxygen used during your procedure.  There is no need for concern and it should clear up in a day or so.  SYMPTOMS TO REPORT IMMEDIATELY:   Following lower endoscopy (colonoscopy or flexible sigmoidoscopy):  Excessive amounts of blood in the stool  Significant tenderness or worsening of abdominal pains  Swelling of the abdomen that is new, acute  Fever of 100F or higher  For urgent or emergent issues, a gastroenterologist can be reached at any hour by calling [phone].   DIET: Your first meal following the procedure should be a small meal and then it is ok to progress to your normal diet. Heavy or fried foods are harder to digest and may make you feel nauseous or bloated.  Likewise, meals heavy in dairy and vegetables can increase bloating.  Drink plenty of fluids but you should avoid alcoholic beverages for 24  hours.  ACTIVITY:  You should plan to take it easy for the rest of today and you should NOT DRIVE or use heavy machinery until tomorrow (because of the sedation medicines used during the test).    FOLLOW UP: Our staff will call the number listed on your records the next business day following your procedure to check on you and address any questions or concerns that you may have regarding the information given to you following your procedure. If we do not reach you, we will leave a message.  However, if you are feeling well and you are not experiencing any problems, there is no need to return our call.  We will assume that you have returned to your regular daily activities without incident.  If any biopsies were taken you will be contacted by phone or by letter within the next 1-3 weeks.  Please call us at [phone] if you have not heard about the biopsies in 3 weeks.    SIGNATURES/CONFIDENTIALITY: You and/or your care partner have signed paperwork which will be entered into your electronic medical record.  These signatures attest to the fact that that the information above on your After Visit Summary has been reviewed and is understood.  Full responsibility of the confidentiality of this discharge information lies with you and/or your care-partner.  Please read polyp, high fiber diet, and diverticulosis handouts provided.

## 2015-10-29 NOTE — Progress Notes (Signed)
Called to room to assist during endoscopic procedure.  Patient ID and intended procedure confirmed with present staff. Received instructions for my participation in the procedure from the performing physician.  

## 2015-10-29 NOTE — Telephone Encounter (Signed)
Thanks Dan

## 2015-10-30 ENCOUNTER — Ambulatory Visit: Payer: BLUE CROSS/BLUE SHIELD | Admitting: Nutrition

## 2015-10-30 ENCOUNTER — Telehealth: Payer: Self-pay

## 2015-10-30 ENCOUNTER — Telehealth: Payer: Self-pay | Admitting: *Deleted

## 2015-10-30 NOTE — Telephone Encounter (Signed)
  Follow up Call-  Call back number 10/29/2015  Post procedure Call Back phone  # [phone]  Permission to leave phone message Yes     Patient questions:  Do you have a fever, pain , or abdominal swelling? No. Pain Score  0 *  Have you tolerated food without any problems? Yes.    Have you been able to return to your normal activities? Yes.    Do you have any questions about your discharge instructions: Diet   No. Medications  No. Follow up visit  No.  Do you have questions or concerns about your Care? No.  Actions: * If pain score is 4 or above: No action needed, pain <4.

## 2015-10-30 NOTE — Telephone Encounter (Signed)
Called [facility](karen) and scheduled with Dr. Marcello Moores on 11/11/15 at 3:00 PM. If path is + call back and she will move up OV.

## 2015-10-31 ENCOUNTER — Other Ambulatory Visit: Payer: BLUE CROSS/BLUE SHIELD

## 2015-10-31 ENCOUNTER — Encounter: Payer: Self-pay | Admitting: *Deleted

## 2015-10-31 ENCOUNTER — Other Ambulatory Visit: Payer: Self-pay | Admitting: *Deleted

## 2015-10-31 DIAGNOSIS — C20 Malignant neoplasm of rectum: Secondary | ICD-10-CM

## 2015-10-31 NOTE — Telephone Encounter (Signed)
See results note. Patient's OV moved up.

## 2015-11-01 ENCOUNTER — Ambulatory Visit (INDEPENDENT_AMBULATORY_CARE_PROVIDER_SITE_OTHER)
Admission: RE | Admit: 2015-11-01 | Discharge: 2015-11-01 | Disposition: A | Discharge status: Home / Self Care | Payer: BLUE CROSS/BLUE SHIELD | Source: Ambulatory Visit | Attending: Gastroenterology | Admitting: Gastroenterology

## 2015-11-01 DIAGNOSIS — C20 Malignant neoplasm of rectum: Secondary | ICD-10-CM

## 2015-11-01 LAB — CEA: CEA: <0.5 ng/mL

## 2015-11-01 MED ORDER — IOPAMIDOL (ISOVUE-300) INJECTION 61%
100.0000 mL | Freq: Once | INTRAVENOUS | Status: AC | PRN
Start: 2015-11-01 — End: 2015-11-01
  Administered 2015-11-01: 100 mL via INTRAVENOUS

## 2015-11-04 ENCOUNTER — Other Ambulatory Visit: Payer: Self-pay | Admitting: *Deleted

## 2015-11-04 DIAGNOSIS — C20 Malignant neoplasm of rectum: Secondary | ICD-10-CM

## 2015-11-05 ENCOUNTER — Telehealth: Payer: Self-pay | Admitting: *Deleted

## 2015-11-05 NOTE — Telephone Encounter (Signed)
Received a call from [facility] that patient did not show for OV today with Dr. Marcello Moores. Called patient and she states she tried to call this AM to [facility] to confirm because she got a packet with 11/11/15 date and was not sure what to do. [facility] notified and they will reschedule OV.

## 2015-11-08 ENCOUNTER — Ambulatory Visit (HOSPITAL_COMMUNITY)
Admission: RE | Admit: 2015-11-08 | Discharge: 2015-11-08 | Disposition: A | Discharge status: Home / Self Care | Payer: BLUE CROSS/BLUE SHIELD | Source: Ambulatory Visit | Attending: Gastroenterology | Admitting: Gastroenterology

## 2015-11-08 DIAGNOSIS — C20 Malignant neoplasm of rectum: Secondary | ICD-10-CM | POA: Insufficient documentation

## 2015-11-08 LAB — POCT I-STAT CREATININE: Creatinine, Ser: 0.6 mg/dL (ref 0.44–1.00)

## 2015-11-08 MED ORDER — GADOBENATE DIMEGLUMINE 529 MG/ML IV SOLN
20.0000 mL | Freq: Once | INTRAVENOUS | Status: AC | PRN
  Administered 2015-11-08: 17 mL via INTRAVENOUS

## 2015-11-11 ENCOUNTER — Telehealth: Payer: Self-pay | Admitting: Gastroenterology

## 2015-11-11 NOTE — Telephone Encounter (Signed)
See results note. 

## 2015-11-11 NOTE — Telephone Encounter (Signed)
Spoke with Claiborne Billings and the MRI will be fine no need for [place].

## 2015-11-12 ENCOUNTER — Telehealth: Payer: Self-pay | Admitting: Family Medicine

## 2015-11-12 ENCOUNTER — Encounter: Payer: Self-pay | Admitting: Endocrinology

## 2015-11-12 ENCOUNTER — Ambulatory Visit (INDEPENDENT_AMBULATORY_CARE_PROVIDER_SITE_OTHER): Payer: BLUE CROSS/BLUE SHIELD | Admitting: Endocrinology

## 2015-11-12 VITALS — BP 132/80 | HR 89 | Wt 187.0 lb

## 2015-11-12 DIAGNOSIS — E0590 Thyrotoxicosis, unspecified without thyrotoxic crisis or storm: Secondary | ICD-10-CM

## 2015-11-12 DIAGNOSIS — I517 Cardiomegaly: Secondary | ICD-10-CM

## 2015-11-12 LAB — T4, FREE: Free T4: 1.33 ng/dL (ref 0.60–1.60)

## 2015-11-12 LAB — TSH: TSH: 0.06 u[IU]/mL — ABNORMAL LOW (ref 0.35–4.50)

## 2015-11-12 NOTE — Progress Notes (Signed)
Subjective:    Patient ID: Destiny [name], female    DOB: July 13, 1965, 50 y.o.   MRN: [id number]  HPI Pt returns for f/u of hyperthyroidism (dx'ed 2016; probably due to [place] dz; she has never had dedicated thyroid imaging; tapazole was chosen as initial rx, due to severity, but she chooses to continue this as long-term rx).  Pt was recently dx'ed with colon cancer. She says she often misses the tapazole.  Past Medical History  Diagnosis Date  . Hypertension   . Depression     Past Surgical History  Procedure Laterality Date  . Eye surgery    . Bladder surgery    . Vaginal hysterectomy      Social History   Social History  . Marital Status: Divorced    Spouse Name: N/A  . Number of Children: 3  . Years of Education: N/A   Occupational History  . Not on file.   Social History Main Topics  . Smoking status: Never Smoker   . Smokeless tobacco: Never Used  . Alcohol Use: 0.0 oz/week    0 Standard drinks or equivalent per week     Comment: occ  . Drug Use: No  . Sexual Activity: Yes    Birth Control/ Protection: Surgical   Other Topics Concern  . Not on file   Social History Narrative    Current Outpatient Prescriptions on File Prior to Visit  Medication Sig Dispense Refill  . acetaminophen (TYLENOL) 500 MG tablet Take 1,000 mg by mouth every 8 (eight) hours as needed for moderate pain.    Marland Kitchen gabapentin (NEURONTIN) 300 MG capsule Take 1 tablet first day, then 1 tablet twice a day second day, then go to 1 tablet three times a day. (Patient taking differently: 300 mg 3 (three) times daily. ) 93 capsule 0  . hydrochlorothiazide (HYDRODIURIL) 12.5 MG tablet Take 1 tablet (12.5 mg total) by mouth daily. 90 tablet 3  . LORazepam (ATIVAN) 0.5 MG tablet TAKE  (1)  TABLET TWICE A DAY. 60 tablet 1  . methimazole (TAPAZOLE) 10 MG tablet Take 4 tablets (40 mg total) by mouth 2 (two) times daily. 240 tablet 2  . sertraline (ZOLOFT) 50 MG tablet Take 1 tablet (50 mg total) by mouth  daily. 30 tablet 3   No current facility-administered medications on file prior to visit.    No Known Allergies  Family History  Problem Relation Age of Onset  . Thyroid disease Mother   . Lung cancer Father     lung  . Cirrhosis Brother     BP 132/80 mmHg  Pulse 89  Wt 187 lb (84.823 kg)  SpO2 96%  LMP  (LMP Unknown)  Review of Systems Denies fever and weight change.    Objective:   Physical Exam VITAL SIGNS:  See vs page GENERAL: no distress NECK: thyroid is approx 4-5 times normal size, diffuse.  Skin: not diaphoretic.  Neuro: no tremor.   Lab Results  Component Value Date   TSH 0.06* 11/12/2015   T4TOTAL 32.3* 02/18/2015      Assessment & Plan:  Hyperthyroidism: therapy limited by noncompliance.  i'll do the best i can.   Patient is advised the following: Patient Instructions  It is really important to never miss the methimazole.  If you miss, double up (which would be 8 pills) the next time. blood tests are requested for you today.  We'll let you know about the results. if ever you have fever while  taking methimazole, stop it and call us, even if the reason is obvious, because of the risk of a rare side-effect.   Please come back for a follow-up appointment in 1 month.     Renato Shin, MD

## 2015-11-12 NOTE — Patient Instructions (Addendum)
It is really important to never miss the methimazole.  If you miss, double up (which would be 8 pills) the next time. blood tests are requested for you today.  We'll let you know about the results. if ever you have fever while taking methimazole, stop it and call us, even if the reason is obvious, because of the risk of a rare side-effect.   Please come back for a follow-up appointment in 1 month.

## 2015-11-12 NOTE — Telephone Encounter (Signed)
Please address

## 2015-11-13 ENCOUNTER — Telehealth: Payer: Self-pay | Admitting: *Deleted

## 2015-11-13 ENCOUNTER — Other Ambulatory Visit: Payer: Self-pay | Admitting: Family Medicine

## 2015-11-13 DIAGNOSIS — I517 Cardiomegaly: Secondary | ICD-10-CM

## 2015-11-13 NOTE — Telephone Encounter (Signed)
It looks like the patient had a CT scan of the chest that showed she had cardiomegaly without pleural effusion. Order an echocardiogram for her under diagnosis of cardiomegaly

## 2015-11-13 NOTE — Telephone Encounter (Signed)
Oncology Nurse Navigator Documentation  Oncology Nurse Navigator Flowsheets 11/13/2015  Navigator Location [facility]  Navigator Encounter Type Introductory phone call  Abnormal Finding Date 10/29/2015  Confirmed Diagnosis Date 10/29/2015   Spoke with patient and provided new patient appointment for [facility] on 11/15/15 at 0830 seeing Dr. Benay Spice and Dr. Lisbeth Renshaw. Informed of location of [place], valet service, and registration process. Reminded to bring insurance cards and a current medication list, including supplements. Patient verbalizes understanding.

## 2015-11-13 NOTE — Telephone Encounter (Signed)
Patient aware, echo has been ordered.

## 2015-11-15 ENCOUNTER — Ambulatory Visit (HOSPITAL_BASED_OUTPATIENT_CLINIC_OR_DEPARTMENT_OTHER): Payer: BLUE CROSS/BLUE SHIELD | Admitting: Oncology

## 2015-11-15 ENCOUNTER — Encounter: Payer: Self-pay | Admitting: General Practice

## 2015-11-15 ENCOUNTER — Encounter: Payer: Self-pay | Admitting: Radiation Oncology

## 2015-11-15 ENCOUNTER — Ambulatory Visit: Payer: BLUE CROSS/BLUE SHIELD | Attending: Oncology | Admitting: Physical Therapy

## 2015-11-15 ENCOUNTER — Ambulatory Visit
Admission: RE | Admit: 2015-11-15 | Discharge: 2015-11-15 | Disposition: A | Discharge status: Home / Self Care | Payer: BLUE CROSS/BLUE SHIELD | Source: Ambulatory Visit | Attending: Radiation Oncology | Admitting: Radiation Oncology

## 2015-11-15 ENCOUNTER — Telehealth: Payer: Self-pay | Admitting: Oncology

## 2015-11-15 ENCOUNTER — Ambulatory Visit: Payer: BLUE CROSS/BLUE SHIELD | Admitting: Nutrition

## 2015-11-15 ENCOUNTER — Encounter: Payer: Self-pay | Admitting: *Deleted

## 2015-11-15 VITALS — BP 122/81 | HR 94 | Temp 98.0°F | Resp 20 | Ht 61.5 in | Wt 187.7 lb

## 2015-11-15 DIAGNOSIS — L599 Disorder of the skin and subcutaneous tissue related to radiation, unspecified: Secondary | ICD-10-CM

## 2015-11-15 DIAGNOSIS — M6281 Muscle weakness (generalized): Secondary | ICD-10-CM | POA: Diagnosis not present

## 2015-11-15 DIAGNOSIS — Z510 Encounter for antineoplastic radiation therapy: Secondary | ICD-10-CM | POA: Diagnosis not present

## 2015-11-15 DIAGNOSIS — F329 Major depressive disorder, single episode, unspecified: Secondary | ICD-10-CM | POA: Diagnosis not present

## 2015-11-15 DIAGNOSIS — C20 Malignant neoplasm of rectum: Secondary | ICD-10-CM

## 2015-11-15 DIAGNOSIS — I10 Essential (primary) hypertension: Secondary | ICD-10-CM

## 2015-11-15 HISTORY — DX: Malignant neoplasm of rectum: C20

## 2015-11-15 NOTE — Progress Notes (Signed)
Patient was seen in GI clinic.  50 year old female diagnosed with stage III rectal cancer.  She is a patient of Dr. Julieanne Manson and Dr. Lisbeth Renshaw.  Plan is for concurrent chemoradiation therapy.  Past medical history includes hypertension, and depression.  Medications include ferrous sulfate, Ativan, and Zoloft.  Labs were reviewed.  Height: 62 inches. Weight: 187.7 pounds June 16. Usual body weight: 175 pounds. BMI: 34.99.  Patient denies nutrition impact symptoms. She reports she eats meals outside the home most of the time or uses the grill. Denies food allergies or intolerances.  Nutrition diagnosis: Food and nutrition related knowledge deficit related to new diagnosis of rectal cancer as evidenced by no prior need for nutrition related information.    Intervention: Patient educated to consume small frequent meals and snacks with adequate calories and protein to promote weight maintenance. Reviewed high protein foods with patient and provided fact sheet. Briefly reviewed food choices if patient develops diarrhea and provided a fact sheet. Questions were answered and teach back method was used.  Contact information was given.  Monitoring, evaluation, goals: Patient will tolerate adequate calories and protein to minimize weight loss throughout treatment.  Next visit: To be scheduled as needed.  **Disclaimer: This note was dictated with voice recognition software. Similar sounding words can inadvertently be transcribed and this note may contain transcription errors which may not have been corrected upon publication of note.**

## 2015-11-15 NOTE — Progress Notes (Signed)
[facility] Psychosocial Distress Screening Spiritual Care  Met with X[place], sister-in-law, and niece in [facility] per referral from Dutch Gray, nurse navigator.  Provided emotional support, introduced Support Center team/resources, and reviewed distress screen per protocol.  The patient scored a 9 on the Psychosocial Distress Thermometer which indicates severe distress. Also assessed for distress and other psychosocial needs.   ONCBCN DISTRESS SCREENING 11/15/2015  Screening Type Initial Screening  Distress experienced in past week (1-10) 9  Emotional problem type Depression;Nervousness/Anxiety;Adjusting to illness;Adjusting to appearance changes  Spiritual/Religous concerns type Relating to God;Facing my mortality  Physical Problem type Sleep/insomnia;Tingling hands/feet  Referral to support programs Yes  Other [facility] presented as much less distressed toward the end of clinic than she did on her screen, citing relief that dx and tx plan were less severe than she feared.  She verbalized relief being able to have oral chemo instead of IV, and especially at not anticipating hair loss.  Per pt, she is not active in a church, but prays often and finds comfort in [place].  Her prayer and Bible reading practice help her cope with distress, making meaning from challenge.  She also notes that spending time in her pool is very relaxing.  She reports good emotional support from family.  We talked about the difficulty of having to field emotional anxiety from others (such as her very tearful friends); normalized range of emotions and expressions, while offering support for supporters (so that [facility] doesn't have to provide such reassurance herself).  She took my card gladly and plans to encourage her friend to call for emotional space to process her own distress.  Follow up needed: Yes.  Plan to f/u by handwritten note and support resource packet (especially to normalize  needing/seeking support), but please also page if needs arise/circumstances change.  Thank you.  [place], [place],  [facility] Pager [phone] Voicemail [phone]

## 2015-11-15 NOTE — Patient Instructions (Addendum)
Skin Care and Bowel Hygiene  Anyone who has frequent bowel movements, diarrhea, or bowel leakage (fecal incontinence) may experience soreness or skin irritation around the anal region.  Occasionally, the skin can become so inflamed that it breaks into open sores.  Prevent skin breakdown by following good skin care habits.  Cleaning and Washing Techniques After having a bowel movement, men and women should tighten their anal sphincter before wiping.  Women should always wipe from front to back to prevent fecal matter from getting into the urethra and vagina.   Tips for Cleaning and Washing . wipe from front to back towards the anus . always wipe gently with soft toilet paper, or ideally with moist toilet paper . wipe only once with each piece of toilet paper so as not to re-contaminate the area . wash in warm water alone or with a minimal amount of mild, fragrance-free soap . use non-biological washing powder . gently pat skin completely dry, avoiding rubbing . if drying the skin after washing is difficult or uncomfortable, try using a hairdryer on a low setting (use very carefully) . allow air to get to the irritated area for some part of every day . use protective skin creams containing zinc as recommended by your doctor  What To Avoid . baths with extra-hot water . soaking for long periods of time in the bathtub . disinfectants and antiseptics  . bath oils, bath salts, and talcum powder . using plastic pants, pads, and sheets, which cause sweating . scratching at the irritated area  Additional Tips . some people find that citrus and acidic foods cause or worsen skin irritation . eat a healthy, balanced diet that is high in fiber  . drink plenty of fluids . wear cotton underwear to allow the skin to breath . talk to your healthcare provider about further treatment options; persistent problems need medical attention  Earlie Counts, PT [place], [place] [address]      [phone]  Tandem Stance    Can hold onto counter if not feeling steady. Right foot in front of left, heel touching toe both feet "straight ahead". Stand on Foot Triangle of Support with both feet. Balance in this position _30__ seconds. Do with left foot in front of right. 3 times each  Copyright  VHI. All rights reserved.    Stance: single leg on floor. Raise leg. Hold _15__ seconds. Repeat with other leg. __3_ reps per set, _1__ sets per day .  Can hold onto counter to keep steady  Copyright  VHI. All rights reserved.   Tips for Energy Conservation for Activities of Daily Living . Plan ahead to avoid rushing. . Sit down to bathe and dry off. Wear a terry robe instead of drying off. . Use a shower/bath organizer to decrease leaning and reaching. . Use extension handles on sponges and brushes. Susa Simmonds grab rails in the bathroom or use an elevated toilet seat. Hoyle Barr out clothes and toiletries before dressing. . Minimize leaning over to put on clothes and shoes. Bring your foot to your knee to apply socks and shoes. . Wear comfortable shoes and low-heeled, slip on shoes. Wear button front shirts rather than pullovers. Housekeeping . Schedule household tasks throughout the week. . Do housework sitting down when possible. . Delegate heavy housework, shopping, laundry and child care when possible. . Drag or slide objects rather than lifting. . Sit when ironing and take rest periods. . Stop working before becoming overly tired. Shopping .  Organize list by aisle. . Use a grocery cart for support. Marland Kitchen Shop at less busy times. . Ask for help with getting to the car. Meal Preparation . Use convenience and easy-to-prepare foods. . Use small appliances that take less effort to use. Marland Kitchen Prepare meals sitting down. . Soak dishes instead of scrubbing and let dishes air dry. . Prepare double portions and freeze half. Child Care . Plan activities that can be done sitting down, such  as drawing pictures, playing games, reading, and computer games. . Encourage children to climb up onto your lap or into the highchair instead of being lifted. . Make a game of the household chores so that children will want to help. . Delegate child care when possible.  Earlie Counts, PT, [facility]; [place], [place], [place] [id number]  Toileting Techniques for Bowel Movements (Defecation) Using your belly (abdomen) and pelvic floor muscles to have a bowel movement is usually instinctive.  Sometimes people can have problems with these muscles and have to relearn proper defecation (emptying) techniques.  If you have weakness in your muscles, organs that are falling out, decreased sensation in your pelvis, or ignore your urge to go, you may find yourself straining to have a bowel movement.  You are straining if you are: . holding your breath or taking in a huge gulp of air and holding it  . keeping your lips and jaw tensed and closed tightly . turning red in the face because of excessive pushing or forcing . developing or worsening your  hemorrhoids . getting faint while pushing . not emptying completely and have to defecate many times a day  If you are straining, you are actually making it harder for yourself to have a bowel movement.  Many people find they are pulling up with the pelvic floor muscles and closing off instead of opening the anus. Due to lack pelvic floor relaxation and coordination the abdominal muscles, one has to work harder to push the feces out.  Many people have never been taught how to defecate efficiently and effectively.  Notice what happens to your body when you are having a bowel movement.  While you are sitting on the toilet pay attention to the following areas: . Jaw and mouth position . Angle of your hips   . Whether your feet touch the ground or not . Arm placement  . Spine position . Waist . Belly  tension . Anus (opening of the anal canal)  An Evacuation/Defecation Plan   Here are the 4 basic points:  1. Lean forward enough for your elbows to rest on your knees 2. Support your feet on the floor or use a low stool if your feet don't touch the floor  3. Push out your belly as if you have swallowed a beach ball-you should feel a widening of your waist 4. Open and relax your pelvic floor muscles, rather than tightening around the anus      The following conditions my require modifications to your toileting posture:  . If you have had surgery in the past that limits your back, hip, pelvic, knee or ankle flexibility . Constipation   Your healthcare practitioner may make the following additional suggestions and adjustments:  1) Sit on the toilet  a) Make sure your feet are supported. b) Notice your hip angle and spine position-most people find it effective to lean forward or raise their knees, which can help the muscles around the  anus to relax  c) When you lean forward, place your forearms on your thighs for support  2) Relax suggestions a) Breath deeply in through your nose and out slowly through your mouth as if you are smelling the flowers and blowing out the candles. b) To become aware of how to relax your muscles, contracting and releasing muscles can be helpful.  Pull your pelvic floor muscles in tightly by using the image of holding back gas, or closing around the anus (visualize making a circle smaller) and lifting the anus up and in.  Then release the muscles and your anus should drop down and feel open. Repeat 5 times ending with the feeling of relaxation. c) Keep your pelvic floor muscles relaxed; let your belly bulge out. d) The digestive tract starts at the mouth and ends at the anal opening, so be sure to relax both ends of the tube.  Place your tongue on the roof of your mouth with your teeth separated.  This helps relax your mouth and will help to relax the anus at the  same time.  3) Empty (defecation) a) Keep your pelvic floor and sphincter relaxed, then bulge your anal muscles.  Make the anal opening wide.  b) Stick your belly out as if you have swallowed a beach ball. c) Make your belly wall hard using your belly muscles while continuing to breathe. Doing this makes it easier to open your anus. d) Breath out and give a grunt (or try using other sounds such as ahhhh, shhhhh, ohhhh or grrrrrrr).  4) Finish a) As you finish your bowel movement, pull the pelvic floor muscles up and in.  This will leave your anus in the proper place rather than remaining pushed out and down. If you leave your anus pushed out and down, it will start to feel as though that is normal and give you incorrect signals about needing to have a bowel movement.    Earlie Counts, PT [facility] [address] [place], [place] [address]   Ways to get started on an exercise program 1.  Start for 10 minutes per day with a walking program. 2. Work towards 30 minutes of exercise per day 3. When you do an aerobic exercise program start on a low level 4. Water aerobics is a good place due to decreased strain on your joints 5. Begin your exercise program gradually and progress slowly over time 6. When exercising use correct form. a. Keep neutral spine b. Engage abdominals c. Keep chest up  d. Chin down e. Do not lock your knees  Earlie Counts, PT [facility], [place], [place] [address] [phone]  The Importance of Exercise as it Relates to [place] suggests that people who have been treated for colon cancer can reduce their risk of the cancer coming back and improve their odds of survival by as much as 50% if they engage in regular exercise. This is because of the effect exercise has on chemicals in the body that are related to cancer development and progression. Studies have also shown  a 24% lower risk of colon cancer in people who are physically active compared to those who are not.  Make it your goal to achieve the American Cancer Society's recommendation to exercise at a moderate to vigorous pace for 30 to 60 minutes at least 5 days a week. You can do this by walking, biking, taking a Zumba  class, swimming, or any combination of activities that keeps you moving for that length of time. Not sure how to start an exercise program? . Start small and set reasonable interim goals.  Don't expect to spend hours in the gym. . If you want to change how your body looks, take a "before" picture and look at it from time to time to see if you notice changes in your weight or muscle tone as a result of your workouts. . Find a friend to exercise with.  You can each help the other stay on track and have fun too. . Schedule time to exercise.  It's been found that more people stick with an exercise program if they do it before their workday begins. . Making only one change at a time with your exercise program can reduce the chance of setting yourself up for failure. How to stay motivated . Choose a type of exercise that you enjoy-you'll be more likely to stick with it. Marland Kitchen Keep in mind how good it feels after you have exercised!  . If you are feeling achy, sore, or fatigued, it's okay to limit your exercise that day. Then don't beat yourself up about not doing enough. Instead, feel good about what you were able to do.  . Make a reward system for yourself. . Have a buddy to exercise with; encourage each other. . Enroll in a class so others expect you to be there. While the following suggestions increase overall activity and general health, remember moderate to vigorous exercise is still important. Here are some ideas for increasing activity: . Go for a walk to help general endurance. . Take the stairs instead of the elevator for endurance, strength, and improving function. . Try a yoga class to  work on your flexibility. . Do resistance training with light weights or resistive bands to build strength. . Take frequent breaks throughout the day to stand, stretch, and take short walks.  This can help you feel better both physically and mentally.  Earlie Counts, PT [place], [place] [address]  Pelvic Floor Exercises for Bowel Control Exercises using both the external anal sphincter and the deep pelvic floor muscles can help you to improve your bowel control. When done correctly, these exercises can tone and strengthen the muscles to help you hold back gas and prevent fecal incontinence (leakage of stool). Exercise programs take time; you may not see any noticeable change in your bowel control immediately.  In some cases it may take several months to regain control. Bowel Control Muscles The anus and the anal canal, has rings of muscle around it. The outer ring of muscle is called the external anal sphincter; it is a voluntary muscle which you can learn to tighten and close more efficiently. When you contract it you will feel the skin around your anus tighten and pull in as if the anus is winking. Try to keep the buttocks muscles relaxed. The inner ring around the anus is the internal anal sphincter. It is an involuntary and automatic muscle; you don't have to think to keep it closed or open.  This muscle should be closed at all times, except when you are actually trying to have a bowel movement.  In addition to the sphincter muscles, there are deeper muscles called the levator ani that form a sling from your tailbone to your pubic bone. The levator ani muscle has a specific part called the puborectalis that holds stool in until you give  the signal to relax and empty.  When you contract these muscles it creates a feeling of lifting the anus inward.   External Anal Sphincter     Levator ani deep layer   Effective Exercises for Control of Gas and Bowels . Identify the specific  areas of the pelvic floor muscles you need to use.  This can be done using a mirror to see if you are contracting the correct muscles or by placing the pad of your finger at or just inside the anal opening. . Develop an exercise plan for strength, endurance and quick response of the muscles and stick with it.  You must make the muscles do more than they are used to doing. . Incorporate the exercises into your daily activities.

## 2015-11-15 NOTE — Progress Notes (Signed)
Radiation Oncology         [phone]) [phone] ________________________________  Name: Destiny [name] MRN: [id number]  Date: 11/15/2015  DOB: [birth year]  [id number] A Dettinger, MD  Armbruster, Destiny [name]*     REFERRING PHYSICIAN: Havery Moros Destiny [name]*   DIAGNOSIS: The encounter diagnosis was Rectal malignant neoplasm ([place]).   HISTORY OF PRESENT ILLNESS: Destiny [name] is a 50 y.o. female seen at the request of Dr. Ardis Hughs for a new diagnosis of rectal cancer. The patient experiencing several months of increasing stool in 2-3 months of rectal bleeding prior to be seen by Dr. Havery Moros. She underwent colonoscopy on 10/29/2015 which revealed a 3 mm polyp in ascending colon, multiple small diverticula in the left colon, and an ulcerated, nonobstructing large mass within the rectum, 2 cm from the anal verge in the superior portion 8 verge. This was partially circumferential intact to the time. Final pathology revealed adenocarcinoma of the rectal mass, and a tubular adenoma of the ascending all up without high-grade. Her CEA the time was 1.5 She went on to have staging CT scan on 11/01/2015 revealing interval rectal wall thickening consistent with her primary lesion without evidence of metastatic disease. Coronary artery enlargement suggesting pulmonary arterial hypertension. She is also undergone an MRI of the pelvis on 11/09/2015 revealing a perirectal node measuring 5 mm that was about 5 mm from the mesorectal fascia, at 5:00. A 6 mm node is also identified at the 7:00 position. Based on her findings this is felt to be Stage IIIB, T3, N1, M0 adenocarcinoma of the rectum. She comes today for further discussion and a multidisciplinary clinic on the rationale for noeadjuvant therapies. She has met with Dr. Marcello Moores on 11/05/2015 and physical patient that there is high likelihood of having to perform an APR, and that she would like to see the patient back a week after she completes her radiotherapy. She comes today to  discuss the rationale for radiotherapy under the care of Dr. Lisbeth Renshaw.    PREVIOUS RADIATION THERAPY: No   PAST MEDICAL HISTORY:  Past Medical History  Diagnosis Date  . Hypertension   . Depression   . Rectal cancer ([place])     Adenocarcinoma dx 10/29/15; Stage III, T3, N1, MO       PAST SURGICAL HISTORY: Past Surgical History  Procedure Laterality Date  . Eye surgery    . Bladder surgery    . Vaginal hysterectomy       FAMILY HISTORY:  Family History  Problem Relation Age of Onset  . Thyroid disease Mother   . Lung cancer Father     lung  . Cirrhosis Brother      SOCIAL HISTORY:  reports that she has never smoked. She has never used smokeless tobacco. She reports that she drinks alcohol. She reports that she does not use illicit drugs. The patient is divorced and resides in [place], [place]. 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   ALLERGIES: Review of patient's allergies indicates no known allergies.   MEDICATIONS:  Current Outpatient Prescriptions  Medication Sig Dispense Refill  . acetaminophen (TYLENOL) 500 MG tablet Take 1,000 mg by mouth every 8 (eight) hours as needed for moderate pain.    . ferrous sulfate 325 (65 FE) MG tablet Take 325 mg by mouth daily with breakfast.    . gabapentin (NEURONTIN) 300 MG capsule Take 1 tablet first day, then 1 tablet twice a day second day, then go to 1 tablet three times a  day. (Patient taking differently: 300 mg 3 (three) times daily. ) 93 capsule 0  . hydrochlorothiazide (HYDRODIURIL) 12.5 MG tablet Take 1 tablet (12.5 mg total) by mouth daily. 90 tablet 3  . LORazepam (ATIVAN) 0.5 MG tablet TAKE  (1)  TABLET TWICE A DAY. (Patient taking differently: TAKE  (1)  TABLET TWICE A DAY. prn) 60 tablet 1  . methimazole (TAPAZOLE) 10 MG tablet Take 4 tablets (40 mg total) by mouth 2 (two) times daily. 240 tablet 2  . sertraline (ZOLOFT) 50 MG tablet Take 1 tablet (50 mg total) by mouth daily. 30 tablet 3   No current  facility-administered medications for this encounter.     REVIEW OF SYSTEMS: On review of systems, the patient reports that she is doing well overall. The only issue is that she has been continuing to notice rectal bleeding with a bowel movement. She describes a few tablespoons of blood each time she has a bowel movement denies any difficulty with constipation and in fact is having loose bowel movements and up to 6 bowel movements per day. She states that she does not have any pain in her abdomen or within the rectum She denies any chest pain, shortness of breath, cough, fevers, chills, night sweats, unintended weight changes. She denies any  bladder disturbances, and denies abdominal pain, nausea or vomiting. She denies any new musculoskeletal or joint aches or pains. A complete review of systems is obtained and is otherwise negative.     PHYSICAL EXAM:  Blood pressure 122/81 pulse 94 respiratory rate 20 temperature 90.34F Pain scale 0/10 In general this is a well appearing Caucasian female in no acute distress. She is alert and oriented x4 and appropriate throughout the examination. HEENT reveals that the patient is normocephalic, atraumatic. EOMs are intact. PERRLA. Skin is intact without any evidence of gross lesions. Cardiovascular exam reveals a regular rate and rhythm, no clicks rubs or murmurs are auscultated. Chest is clear to auscultation bilaterally. Lymphatic assessment is performed and does not reveal any adenopathy in the cervical, supraclavicular, axillary, or inguinal chains. Abdomen has active bowel sounds in all quadrants and is intact. The abdomen is soft, non tender, non distended. Lower extremities are negative for pretibial pitting edema, deep calf tenderness, cyanosis or clubbing. Digital rectal exam reveals palpable tumor that is approximately 2-3 cm from the anal verge along the lateral aspect of the posterior wall of the rectum consistent with her known primary.   ECOG =  1  0 - Asymptomatic (Fully active, able to carry on all predisease activities without restriction)  1 - Symptomatic but completely ambulatory (Restricted in physically strenuous activity but ambulatory and able to carry out work of a light or sedentary nature. For example, light housework, office work)  2 - Symptomatic, <50% in bed during the day (Ambulatory and capable of all self care but unable to carry out any work activities. Up and about more than 50% of waking hours)  3 - Symptomatic, >50% in bed, but not bedbound (Capable of only limited self-care, confined to bed or chair 50% or more of waking hours)  4 - Bedbound (Completely disabled. Cannot carry on any self-care. Totally confined to bed or chair)  5 - Death   Eustace Pen MM, Creech RH, Tormey DC, et al. [phone]). "Toxicity and response criteria of the [facility]". [place] Oncol. 5 (6): 649-55    LABORATORY DATA:  Lab Results  Component Value Date   WBC 7.0 10/01/2015  HGB 10.9* 02/08/2015   HCT 32.0* 10/01/2015   MCV 76* 10/01/2015   PLT 354 10/01/2015   Lab Results  Component Value Date   NA 139 02/15/2015   K 4.6 02/15/2015   CL 101 02/15/2015   CO2 22 02/15/2015   Lab Results  Component Value Date   ALT 13 02/15/2015   AST 20 02/15/2015   ALKPHOS 119* 02/15/2015   BILITOT 0.5 02/15/2015      RADIOGRAPHY: Ct Chest W Contrast  11/01/2015  CLINICAL DATA:  Rectal carcinoma. Rectal bleeding for 2 months. Hypertension. EXAM: CT CHEST, ABDOMEN, AND PELVIS WITH CONTRAST TECHNIQUE: Multidetector CT imaging of the chest, abdomen and pelvis was performed following the standard protocol during bolus administration of intravenous contrast. CONTRAST:  125m ISOVUE-300 IOPAMIDOL (ISOVUE-300) INJECTION 61% COMPARISON:  02/08/2015 chest abdomen and pelvic CTs. FINDINGS: CT CHEST FINDINGS Mediastinum/Lymph Nodes: No supraclavicular adenopathy. Tortuous thoracic aorta. Moderate cardiomegaly, without  pericardial effusion. Pulmonary artery enlargement, including a 3.9 cm outflow tract. No central pulmonary embolism, on this non-dedicated study. No mediastinal or hilar adenopathy. Lungs/Pleura: No pleural fluid.  Minimal motion degradation. Given this factor, no nodules or airspace opacities identified. Musculoskeletal: No acute osseous abnormality. CT ABDOMEN PELVIS FINDINGS Hepatobiliary: Normal liver. Normal gallbladder, without biliary ductal dilatation. Pancreas: Fatty atrophy throughout the pancreas, without duct dilatation or dominant mass. Spleen: Normal in size, without focal abnormality. Adrenals/Urinary Tract: Normal adrenal glands. Normal kidneys, without hydronephrosis. Pelvic floor laxity with mild cystocele on image 116/series 2. Stomach/Bowel: Normal stomach, without wall thickening. Minimal motion continuing into the abdomen and pelvis. There is likely mild anorectal wall thickening, relatively diffusely on image 110/series 2. No gross transmural disease identified. No obstruction. Scattered colonic diverticula. Normal terminal ileum and appendix. Normal small bowel. Vascular/Lymphatic: Normal caliber of the aorta and branch vessels. Circumaortic left renal vein. No retroperitoneal or retrocrural adenopathy. No pelvic sidewall adenopathy. presacral/perirectal node measures 7 mm on image 99/series 2 versus 6 mm back on 02/08/2015. A node at the 5 o'clock position of the mesorectum measures 5 mm on image 102/series 2 versus 6 mm on the prior. Reproductive: Hysterectomy.  No adnexal mass. Other: Pelvic floor laxity. No significant free fluid. No evidence of omental or peritoneal disease. Musculoskeletal: Degenerative disc disease at the lumbosacral junction. IMPRESSION: 1. Minimal motion degradation throughout. 2. anorectal wall thickening which is likely indicative of the primary lesion. No obstruction identified. perirectal nodes which are upper normal to mildly enlarged, suspicious based on  location. 3. No distant metastatic disease identified. 4. Cardiomegaly. 5. Pulmonary artery enlargement suggests pulmonary arterial hypertension. 6. Pelvic floor laxity with a cystocele. Electronically Signed   By: KAbigail MiyamotoM.D.   On: 11/01/2015 16:42   Mr Pelvis W Wo Contrast  11/09/2015  CLINICAL DATA:  Rectal bleeding for 2 months. Rectal adenocarcinoma. EXAM: MRI PELVIS WITHOUT AND WITH CONTRAST TECHNIQUE: Multiplanar multisequence MR imaging of the pelvis was performed both before and after administration of intravenous contrast. Small amount of UKoreagel was administered per rectum to optimize tumor evaluation. CONTRAST:  17 cc MultiHance COMPARISON:  CT of 11/01/2015. FINDINGS: TUMOR LOCATION Location from Anal Verge: Mid rectal, 5.6 cm from anal verge on image 22/series 3. Shortest Distance from Tumor to Anal Sphincter: 2.0 cm on image 28/series 11. Cm TUMOR DESCRIPTION Circumferential ExtEnt: tumor is identified centered about the approximately the 5 o'clock position. Extends approximately 100 degrees circumferentially. Example image 16/series 5. Craniocaudal Extent:  6.5 cm T - CATEGORY Extension through Muscularis Propria: There  is minimal extension through the muscularis propria including on image 16/series 5. T3. Maximum extension beyond Muscularis Propria:  Only 1-2 mm Extramural vascular invasion/tumor thrombus:  None Shortest distance of any tumor/node from Mesorectal Fascia: A perirectal node measures 5 mm on image 7/series 5. This is positioned 5 mm from the mesorectal fascia. Invasion of Anterior Peritoneal Reflection:  No Involvement of Adjacent Organs or Pelvic Sidewall Structures:  No N - CATEGORY Mesorectal Lymph Nodes >=67m: 5 mm node on image 7/series 5 at the 5 o'clock position. A 6 mm node is identified at the 7 o'clock position on image 3/series 5. Extra-mesorectal Lymphadenopathy:  Absent Other: Exam is motion degraded. In addition, the postcontrast T1 weighted images were performed  without fat saturation, decreasing sensitivity and accuracy. Normal urinary bladder. No bowel obstruction. Hysterectomy. Pelvic floor laxity. IMPRESSION: Rectal adenocarcinoma T stage:  early T3 Rectal adenocarcinoma N stage:   N1 Distance from tumor to the anal sphincter is 2.0 cm. Degraded exam, as detailed above. Electronically Signed   By: KAbigail MiyamotoM.D.   On: 11/09/2015 10:26   Ct Abdomen Pelvis W Contrast  11/01/2015  CLINICAL DATA:  Rectal carcinoma. Rectal bleeding for 2 months. Hypertension. EXAM: CT CHEST, ABDOMEN, AND PELVIS WITH CONTRAST TECHNIQUE: Multidetector CT imaging of the chest, abdomen and pelvis was performed following the standard protocol during bolus administration of intravenous contrast. CONTRAST:  1029mISOVUE-300 IOPAMIDOL (ISOVUE-300) INJECTION 61% COMPARISON:  02/08/2015 chest abdomen and pelvic CTs. FINDINGS: CT CHEST FINDINGS Mediastinum/Lymph Nodes: No supraclavicular adenopathy. Tortuous thoracic aorta. Moderate cardiomegaly, without pericardial effusion. Pulmonary artery enlargement, including a 3.9 cm outflow tract. No central pulmonary embolism, on this non-dedicated study. No mediastinal or hilar adenopathy. Lungs/Pleura: No pleural fluid.  Minimal motion degradation. Given this factor, no nodules or airspace opacities identified. Musculoskeletal: No acute osseous abnormality. CT ABDOMEN PELVIS FINDINGS Hepatobiliary: Normal liver. Normal gallbladder, without biliary ductal dilatation. Pancreas: Fatty atrophy throughout the pancreas, without duct dilatation or dominant mass. Spleen: Normal in size, without focal abnormality. Adrenals/Urinary Tract: Normal adrenal glands. Normal kidneys, without hydronephrosis. Pelvic floor laxity with mild cystocele on image 116/series 2. Stomach/Bowel: Normal stomach, without wall thickening. Minimal motion continuing into the abdomen and pelvis. There is likely mild anorectal wall thickening, relatively diffusely on image 110/series 2.  No gross transmural disease identified. No obstruction. Scattered colonic diverticula. Normal terminal ileum and appendix. Normal small bowel. Vascular/Lymphatic: Normal caliber of the aorta and branch vessels. Circumaortic left renal vein. No retroperitoneal or retrocrural adenopathy. No pelvic sidewall adenopathy. presacral/perirectal node measures 7 mm on image 99/series 2 versus 6 mm back on 02/08/2015. A node at the 5 o'clock position of the mesorectum measures 5 mm on image 102/series 2 versus 6 mm on the prior. Reproductive: Hysterectomy.  No adnexal mass. Other: Pelvic floor laxity. No significant free fluid. No evidence of omental or peritoneal disease. Musculoskeletal: Degenerative disc disease at the lumbosacral junction. IMPRESSION: 1. Minimal motion degradation throughout. 2. anorectal wall thickening which is likely indicative of the primary lesion. No obstruction identified. perirectal nodes which are upper normal to mildly enlarged, suspicious based on location. 3. No distant metastatic disease identified. 4. Cardiomegaly. 5. Pulmonary artery enlargement suggests pulmonary arterial hypertension. 6. Pelvic floor laxity with a cystocele. Electronically Signed   By: KyAbigail Miyamoto.D.   On: 11/01/2015 16:42       IMPRESSION: Stage IIIB, T3, N1, M0 adenocarcinoma of the rectum  PLAN: Dr. MoLisbeth Renshaweets the patient, examined her, and reviews her pathology  and imaging studies to date. He recommends proceeding with neoadjuvant radiotherapy, and she will receive concurrent chemosensitization with Xeloda under the care of Dr. Benay Spice. We discussed the logistics of radiotherapy, and the rationale for 28 fractions over the course of 5 1/2 weeks. She states agreement and understanding. We have discussed the risks, benefits, short and long-term effects, and she is interested in proceeding. We will set her up for simulation in our department on   The above documentation reflects my direct findings during this  shared patient visit. Please see the separate note by Dr. Lisbeth Renshaw on this date for the remainder of the patient's plan of care.    Carola Rhine, PAC

## 2015-11-15 NOTE — Progress Notes (Signed)
Oncology Nurse Navigator Documentation  Oncology Nurse Navigator Flowsheets 11/15/2015  Navigator Location [facility]Med Onc  Navigator Encounter Type Clinic/MDC  Abnormal Finding Date -  Confirmed Diagnosis Date -  Patient Visit Type MedOnc;RadOnc  Treatment Phase Pre-Tx/Tx Discussion--preparation for RT/Xeloda  Barriers/Navigation Needs Education;Family concerns  Merchandiser, retail Treatment;Coping with Diagnosis/ Prognosis;Understanding Cancer/ Treatment Options;Newly Diagnosed Cancer Education  Interventions Referrals;Coordination of Care;Education Method  Referrals Spiritual care--Distress screen 9/10 and involved emotional needs and spirituality-called chaplain to complete visit  Coordination of Care Radiology--escorted patient to her SIM today.  Education Method Verbal;Written;Teach-back  Support Groups/Services GI Support Group;[place];Other--[place] support calendar  Acuity Level 2  Time Spent with Patient 10  Met with patient and her sister and other family member after  new patient [place]  visit. Explained the role of the GI Nurse Navigator and provided New Patient Packet with information on: 1. Colorectal cancer--CEA testing, xeloda, chemotherapy general information 2. Support groups 3. Advanced Directives 4. Fall Safety Plan Answered questions, reviewed current treatment plan using TEACH back and provided emotional support. Provided copy of current treatment plan. Start date for RT/Xeloda is 11/25/15. Destiny [name] was informed on the Xeloda ordering process and that pharmacy will call with when it is ready and co pay.  Destiny Elks, RN, BSN GI Oncology [place]

## 2015-11-15 NOTE — Telephone Encounter (Signed)
per pof to sch pt appt-sch chemo class-pof had 9/19-put on week of 6/19-pt starting chemo in [facility] pt copy of avs

## 2015-11-15 NOTE — Progress Notes (Signed)
Spiritual Care Note  Handwritten notecard and [place] information mailing completed.  [place], [place], [facility] Pager [phone] Voicemail [phone]

## 2015-11-15 NOTE — Therapy (Signed)
[facility] [address] [place], [place], [address] Phone: [phone]   Fax:  [phone]  Physical Therapy Evaluation  Patient Details  Name: Destiny [name] MRN: [id number] Date of Birth: April 16, 1966 Referring Provider: Dr. Betsy Coder  Encounter Date: 11/15/2015      PT End of Session - 11/15/15 1041    Visit Number 1   PT Start Time [id number]   PT Stop Time 1035   PT Time Calculation (min) 20 min   Activity Tolerance Patient tolerated treatment well   Behavior During Therapy [facility] for tasks assessed/performed;Anxious      Past Medical History  Diagnosis Date  . Hypertension   . Depression   . Rectal cancer ([place])     Adenocarcinoma dx 10/29/15; Stage III, T3, N1, MO    Past Surgical History  Procedure Laterality Date  . Eye surgery    . Bladder surgery    . Vaginal hysterectomy      There were no vitals filed for this visit.       Subjective Assessment - 11/15/15 1034    Subjective Patient is attending GI cancer clinic   Patient is accompained by: Family member  daughter-in-law; granddaughter   Patient Stated Goals education   Currently in Pain? No/denies            [facility] PT Assessment - 11/15/15 0001    Assessment   Medical Diagnosis rectal cancer   Referring Provider Dr. Betsy Coder   Onset Date/Surgical Date 10/29/15   Prior Therapy None   Precautions   Precautions Other (comment)   Precaution Comments cancer precautions   Restrictions   Weight Bearing Restrictions No   Balance Screen   Has the patient fallen in the past 6 months No   Has the patient had a decrease in activity level because of a fear of falling?  No   Is the patient reluctant to leave their home because of a fear of falling?  No   Home Environment   Living Environment Private residence   Available Help at Discharge Family   Prior Function   Level of Independence Independent   Vocation Other (comment)   Leisure  walk, swim   Cognition   Overall Cognitive Status Within Functional Limits for tasks assessed   Observation/Other Assessments   Focus on Therapeutic Outcomes (FOTO)  Patient has no limitations at this time prior to treatment   Posture/Postural Control   Posture/Postural Control No significant limitations   ROM / Strength   AROM / PROM / Strength AROM;Strength   AROM   Overall AROM Comments lumbar ROM is full   Strength   Right Hip Flexion 4/5   Right Hip ABduction 4/5   Left Hip Flexion 4/5   Left Hip ABduction 4/5                           PT Education - 11/15/15 1039    Education provided Yes   Education Details balance exercises; toileting technique, pelvic floor contraction; tips to conserve energy, skin care; exercise for colon cancer   Person(s) Educated Patient   Methods Explanation;Demonstration;Verbal cues;Handout   Comprehension Returned demonstration;Verbalized understanding             PT Long Term Goals - 11/15/15 1047    PT LONG TERM GOAL #1   Title understand effects of cancer treatment and how to manage    Time 1  Period Days   Status Achieved   PT LONG TERM GOAL #2   Title understand importance of balance and walking exericse to lessen fatique   Time 1   Period Days   Status Achieved   PT LONG TERM GOAL #3   Title understand how to contract pelvic floor to reduce fecal leakage   Time 1   Period Days   Status Achieved               Plan - 11/15/15 1041    Clinical Impression Statement Patient is a 50 year old female with diagnosis of rectal cancer on 10/29/2015 with colonscopy.  Patient is attending GI clinic.  She will be having chemotherapy orally and radiation for 6 weeks.  Afterwards she will have surgery with ilieostomy.  Patient reports no falls.  Lumbar ROM is full.  Bilateral hip flexion and abduction 4/5.   Patient has had fecal urgency for several months.  Somtimes she does not make it to the bathroom and have  leakage.  Patient reports no urinay leakage.  Patient is of low complexity.  Patient benefited from physical therapy to be educated on ways to manage effects from cancer treatment.    Rehab Potential Excellent   Clinical Impairments Affecting Rehab Potential rectal cancer   PT Frequency 1x / week   PT Duration --  1 session in GI clinic   PT Treatment/Interventions Patient/family education;Therapeutic exercise;Therapeutic activities;Balance training   PT Next Visit Plan Discharge at visit due to 1 time in GI clinic   PT Home Exercise Plan Current HEP   Recommended Other Services after radiation, chemotherapy and ilieostomy, patient will benefit from physical therapy for pelvic floor education and flexibility exercises   Consulted and Agree with Plan of Care Family member/caregiver   Family Member Consulted daughter-in-law; granddaugther      Patient will benefit from skilled therapeutic intervention in order to improve the following deficits and impairments:  Decreased strength, Other (comment) (undeducated on cancer treatment effects)  Visit Diagnosis: Muscle weakness (generalized) - Plan: PT plan of care cert/re-cert  Disorder of the skin and subcutaneous tissue related to radiation, unspecified - Plan: PT plan of care cert/re-cert     Problem List Patient Active Problem List   Diagnosis Date Noted  . Anemia 10/01/2015  . Essential hypertension, benign 09/02/2015  . Obesity 09/02/2015  . Depression 05/02/2015  . Hyperthyroidism 02/27/2015  . Closed fracture of nasal bone 02/18/2015    Earlie Counts, PT 11/15/2015 10:52 AM   [facility] [address] [place], [place], [address] Phone: [phone]   Fax:  [phone]  Name: LAMIYAH RIEDY MRN: [id number] Date of Birth: 12-Oct-1965

## 2015-11-15 NOTE — Progress Notes (Signed)
New Hematology/Oncology Consult   Referral CB:[facility]        Reason for Referral: rectal cancer    HPI: Destiny [name] reports the onset of rectal bleeding in March of this year. Destiny [name] was seen in urgent care told Destiny [name] had hemorrhoids. When the bleeding persisted Destiny [name] sought care with her primary physician and was referred to Dr. Havery Moros. Destiny [name] was taken for colonoscopy on 10/29/2015. A 3 mm polyp was removed from the a sending colon. An ulcerated mass was found in the rectum. The mass was noted at 2 cm from the anal verge and extended up to 8-10 centimeters from the anal verge. The mass was biopsied. The pathology confirmed a tubular adenoma at the ascending colon and invasive adenocarcinoma on the rectum biopsy. No loss of mismatch repair protein expression.  Destiny [name] was referred to Dr. Marcello Moores. On physical exam approximate 1 cm of normal rectum was noted between the tumor and the anal verge. Destiny [name] is referred for neoadjuvant therapy. CTs of the chest, abdomen, and pelvis on 11/01/2015 revealed lung nodules. Normal liver. Mild anorectal wall thickening. A presacral/perirectal node measures 7 mm. A node at the 5:00 position of the mesorectum measures 5 mm. An MRI of the pelvis on 11/08/2015 revealed a tumor at 2 cm from the anal verge.tumor extends through the muscularis propria. A perirectal node measures 5 mm. A node at the 7:00 position measured 6 mm. The tumor was staged as a T3 N1 lesion by MRI.      Past Medical History  Diagnosis Date  . Hypertension   . Depression   . Rectal cancer ([place])     Adenocarcinoma dx 10/29/15; Stage III, T3, N1, MO  :.   G3 P3  Past Surgical History  Procedure Laterality Date  . Eye surgery    . Bladder surgery    . Vaginal hysterectomy    : .  Wisdom tooth extraction  Current outpatient prescriptions:  .  ferrous sulfate 325 (65 FE) MG tablet, Take 325 mg by mouth daily with breakfast., Disp: , Rfl:  .  gabapentin (NEURONTIN) 300 MG capsule, Take  1 tablet first day, then 1 tablet twice a day second day, then go to 1 tablet three times a day. (Patient taking differently: 300 mg 3 (three) times daily. ), Disp: 93 capsule, Rfl: 0 .  hydrochlorothiazide (HYDRODIURIL) 12.5 MG tablet, Take 1 tablet (12.5 mg total) by mouth daily., Disp: 90 tablet, Rfl: 3 .  LORazepam (ATIVAN) 0.5 MG tablet, TAKE  (1)  TABLET TWICE A DAY. (Patient taking differently: TAKE  (1)  TABLET TWICE A DAY. prn), Disp: 60 tablet, Rfl: 1 .  methimazole (TAPAZOLE) 10 MG tablet, Take 4 tablets (40 mg total) by mouth 2 (two) times daily., Disp: 240 tablet, Rfl: 2 .  sertraline (ZOLOFT) 50 MG tablet, Take 1 tablet (50 mg total) by mouth daily., Disp: 30 tablet, Rfl: 3 .  acetaminophen (TYLENOL) 500 MG tablet, Take 1,000 mg by mouth every 8 (eight) hours as needed for moderate pain., Disp: , Rfl: :  :  No Known Allergies:  Family History  Problem Relation Age of Onset  . Thyroid disease Mother   . Lung cancer Father     lung  . Cirrhosis Brother   :her cousin had colon cancer in his 52s  Social History   Social History  . Marital Status: Divorced    Spouse Name: N/A  . Number of Children: 3  . Years of Education: N/A  Occupational History  . 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 Smoking status: Never Smoker   . Smokeless tobacco: Never Used  . Alcohol Use: 0.0 oz/week    0 Standard drinks or equivalent per week     Comment: occ  . Drug Use: No  . Sexual Activity: Yes    Birth Control/ Protection: Surgical   Other Topics Concern  . Not on file   Social History Narrative  :  Review of Systems:rectal bleeding  Positives include:  A complete ROS was otherwise negative.   Physical Exam:  Blood pressure 122/81, pulse 94, temperature 98 F (36.7 C), temperature source Oral, resp. rate 20, height 5' 1.5" (1.562 m), weight 187 lb 11.2 oz (85.14 kg), SpO2 100 %.  HEENT: oropharynx without visible mass, neck without  mass Lungs: clear bilaterally Cardiac: regular rate and rhythm Abdomen: no hepatosplenomegaly, no mass, nontender  Vascular: no leg edema Lymph nodes: no cervical, supraclavicular, axillary, or inguinal nodes Neurologic: alert and oriented, the motor exam appears intact in the upper and lower extremities Skin: no rash Musculoskeletal: spine tenderness  Rectal: Nodularity at the anterior and left rectum beginning approximately 2 cm from the anal verge   LABS: CEA on 10/31/2015: Less than 0.5  RADIOLOGY:  Ct Chest W Contrast  11/01/2015  CLINICAL DATA:  Rectal carcinoma. Rectal bleeding for 2 months. Hypertension. EXAM: CT CHEST, ABDOMEN, AND PELVIS WITH CONTRAST TECHNIQUE: Multidetector CT imaging of the chest, abdomen and pelvis was performed following the standard protocol during bolus administration of intravenous contrast. CONTRAST:  183m ISOVUE-300 IOPAMIDOL (ISOVUE-300) INJECTION 61% COMPARISON:  02/08/2015 chest abdomen and pelvic CTs. FINDINGS: CT CHEST FINDINGS Mediastinum/Lymph Nodes: No supraclavicular adenopathy. Tortuous thoracic aorta. Moderate cardiomegaly, without pericardial effusion. Pulmonary artery enlargement, including a 3.9 cm outflow tract. No central pulmonary embolism, on this non-dedicated study. No mediastinal or hilar adenopathy. Lungs/Pleura: No pleural fluid.  Minimal motion degradation. Given this factor, no nodules or airspace opacities identified. Musculoskeletal: No acute osseous abnormality. CT ABDOMEN PELVIS FINDINGS Hepatobiliary: Normal liver. Normal gallbladder, without biliary ductal dilatation. Pancreas: Fatty atrophy throughout the pancreas, without duct dilatation or dominant mass. Spleen: Normal in size, without focal abnormality. Adrenals/Urinary Tract: Normal adrenal glands. Normal kidneys, without hydronephrosis. Pelvic floor laxity with mild cystocele on image 116/series 2. Stomach/Bowel: Normal stomach, without wall thickening. Minimal motion  continuing into the abdomen and pelvis. There is likely mild anorectal wall thickening, relatively diffusely on image 110/series 2. No gross transmural disease identified. No obstruction. Scattered colonic diverticula. Normal terminal ileum and appendix. Normal small bowel. Vascular/Lymphatic: Normal caliber of the aorta and branch vessels. Circumaortic left renal vein. No retroperitoneal or retrocrural adenopathy. No pelvic sidewall adenopathy. presacral/perirectal node measures 7 mm on image 99/series 2 versus 6 mm back on 02/08/2015. A node at the 5 o'clock position of the mesorectum measures 5 mm on image 102/series 2 versus 6 mm on the prior. Reproductive: Hysterectomy.  No adnexal mass. Other: Pelvic floor laxity. No significant free fluid. No evidence of omental or peritoneal disease. Musculoskeletal: Degenerative disc disease at the lumbosacral junction. IMPRESSION: 1. Minimal motion degradation throughout. 2. anorectal wall thickening which is likely indicative of the primary lesion. No obstruction identified. perirectal nodes which are upper normal to mildly enlarged, suspicious based on location. 3. No distant metastatic disease identified. 4. Cardiomegaly. 5. Pulmonary artery enlargement suggests pulmonary arterial hypertension. 6. Pelvic floor laxity with a cystocele. Electronically Signed   By: KAbigail Miyamoto  M.D.   On: 11/01/2015 16:42   Mr Pelvis W Wo Contrast  11/09/2015  CLINICAL DATA:  Rectal bleeding for 2 months. Rectal adenocarcinoma. EXAM: MRI PELVIS WITHOUT AND WITH CONTRAST TECHNIQUE: Multiplanar multisequence MR imaging of the pelvis was performed both before and after administration of intravenous contrast. Small amount of [place] gel was administered per rectum to optimize tumor evaluation. CONTRAST:  17 cc MultiHance COMPARISON:  CT of 11/01/2015. FINDINGS: TUMOR LOCATION Location from Anal Verge: Mid rectal, 5.6 cm from anal verge on image 22/series 3. Shortest Distance from Tumor to Anal  Sphincter: 2.0 cm on image 28/series 11. Cm TUMOR DESCRIPTION Circumferential ExtEnt: tumor is identified centered about the approximately the 5 o'clock position. Extends approximately 100 degrees circumferentially. Example image 16/series 5. Craniocaudal Extent:  6.5 cm T - CATEGORY Extension through Muscularis Propria: There is minimal extension through the muscularis propria including on image 16/series 5. T3. Maximum extension beyond Muscularis Propria:  Only 1-2 mm Extramural vascular invasion/tumor thrombus:  None Shortest distance of any tumor/node from Mesorectal Fascia: A perirectal node measures 5 mm on image 7/series 5. This is positioned 5 mm from the mesorectal fascia. Invasion of Anterior Peritoneal Reflection:  No Involvement of Adjacent Organs or Pelvic Sidewall Structures:  No N - CATEGORY Mesorectal Lymph Nodes >=72m: 5 mm node on image 7/series 5 at the 5 o'clock position. A 6 mm node is identified at the 7 o'clock position on image 3/series 5. Extra-mesorectal Lymphadenopathy:  Absent Other: Exam is motion degraded. In addition, the postcontrast T1 weighted images were performed without fat saturation, decreasing sensitivity and accuracy. Normal urinary bladder. No bowel obstruction. Hysterectomy. Pelvic floor laxity. IMPRESSION: Rectal adenocarcinoma T stage:  early T3 Rectal adenocarcinoma N stage:   N1 Distance from tumor to the anal sphincter is 2.0 cm. Degraded exam, as detailed above. Electronically Signed   By: KAbigail MiyamotoM.D.   On: 11/09/2015 10:26   Ct Abdomen Pelvis W Contrast  11/01/2015  CLINICAL DATA:  Rectal carcinoma. Rectal bleeding for 2 months. Hypertension. EXAM: CT CHEST, ABDOMEN, AND PELVIS WITH CONTRAST TECHNIQUE: Multidetector CT imaging of the chest, abdomen and pelvis was performed following the standard protocol during bolus administration of intravenous contrast. CONTRAST:  1041mISOVUE-300 IOPAMIDOL (ISOVUE-300) INJECTION 61% COMPARISON:  02/08/2015 chest abdomen  and pelvic CTs. FINDINGS: CT CHEST FINDINGS Mediastinum/Lymph Nodes: No supraclavicular adenopathy. Tortuous thoracic aorta. Moderate cardiomegaly, without pericardial effusion. Pulmonary artery enlargement, including a 3.9 cm outflow tract. No central pulmonary embolism, on this non-dedicated study. No mediastinal or hilar adenopathy. Lungs/Pleura: No pleural fluid.  Minimal motion degradation. Given this factor, no nodules or airspace opacities identified. Musculoskeletal: No acute osseous abnormality. CT ABDOMEN PELVIS FINDINGS Hepatobiliary: Normal liver. Normal gallbladder, without biliary ductal dilatation. Pancreas: Fatty atrophy throughout the pancreas, without duct dilatation or dominant mass. Spleen: Normal in size, without focal abnormality. Adrenals/Urinary Tract: Normal adrenal glands. Normal kidneys, without hydronephrosis. Pelvic floor laxity with mild cystocele on image 116/series 2. Stomach/Bowel: Normal stomach, without wall thickening. Minimal motion continuing into the abdomen and pelvis. There is likely mild anorectal wall thickening, relatively diffusely on image 110/series 2. No gross transmural disease identified. No obstruction. Scattered colonic diverticula. Normal terminal ileum and appendix. Normal small bowel. Vascular/Lymphatic: Normal caliber of the aorta and branch vessels. Circumaortic left renal vein. No retroperitoneal or retrocrural adenopathy. No pelvic sidewall adenopathy. presacral/perirectal node measures 7 mm on image 99/series 2 versus 6 mm back on 02/08/2015. A node at the 5 o'clock position of the  mesorectum measures 5 mm on image 102/series 2 versus 6 mm on the prior. Reproductive: Hysterectomy.  No adnexal mass. Other: Pelvic floor laxity. No significant free fluid. No evidence of omental or peritoneal disease. Musculoskeletal: Degenerative disc disease at the lumbosacral junction. IMPRESSION: 1. Minimal motion degradation throughout. 2. anorectal wall thickening which  is likely indicative of the primary lesion. No obstruction identified. perirectal nodes which are upper normal to mildly enlarged, suspicious based on location. 3. No distant metastatic disease identified. 4. Cardiomegaly. 5. Pulmonary artery enlargement suggests pulmonary arterial hypertension. 6. Pelvic floor laxity with a cystocele. Electronically Signed   By: Abigail Miyamoto M.D.   On: 11/01/2015 16:42    Assessment and Plan:   1. Rectal cancer-clinical stage III (T3 N1)  No loss of mismatch repair protein expression  T3 N1 tumor beginning at 2 cm from the anal verge on an MRI of the pelvis 11/08/2015  CTs of the chest, abdomen, and pelvis 11/01/2015 with no evidence of distant metastatic disease  2. Hypertension  Destiny [name] has been diagnosed with clinical stage III rectal cancer. The rectal tumor is within a few centimeters of the anal verge. Destiny [name] has been referred for neoadjuvant therapy.  Destiny [name] was seen in the multidisciplinary oncology clinic today by myself and Dr. Lisbeth Renshaw. We recommend neoadjuvant chemotherapy/radiation. I recommend capecitabine to be given concurrent with radiation.  We reviewed the potential toxicities associated with capecitabine including the chance for nausea, mucositis, alopecia, diarrhea, and hematologic toxicity. We discussed the sun sensitivity, rash, hyperpigmentation, and hand/foot syndrome associated with capecitabine. We discussed the potential for skin toxicity with concurrent therapy.  Destiny [name] will attend a chemotherapy teaching class. Destiny [name] is scheduled to begin concurrent therapy on 11/25/2015.  Approximately 50 minutes were spent with the patient today. The majority of the time was used for counseling and coordination of care.    Betsy Coder, MD 11/15/2015, 9:00 AM

## 2015-11-15 NOTE — Patient Instructions (Signed)
  Care Plan Summary- 11/15/2015 Name:  Malika Greiff      DOB: 1966/05/16 Your Medical Team:  Medical Oncologist:  Dr. Ma Rings Radiation Oncologist:  Dr. Kyung Rudd Surgeon:   Dr. Leighton Ruff Type of Cancer: Rectal Cancer--adenocarcinoma  Stage/Grade: Stage III *Exact staging of your cancer is based on size of the tumor, depth of invasion, involvement of lymph nodes or not, and whether or not the cancer has spread beyond the primary site.   Recommendations: Based on information available as of today's consult. Recommendations may change depending on the results of further tests or exams. 1) Radiation therapy with oral chemotherapy (Xeloda) M-F for 6 weeks-start 11/25/14 2) Surgery after RT/Xeloda completed 3)  ______________________________________________________________________________ Next Steps: 1) SIM for radiation today at 11:00 2) Chemo teaching class next week 3) Pharmacy will call when script is ready and your co pay amount  Questions? Merceda Elks, RN, BSN at [phone]. Manuela Schwartz is your Oncology Nurse Navigator and is available to assist you while you're receiving your medical care at [facility].

## 2015-11-17 DIAGNOSIS — C20 Malignant neoplasm of rectum: Secondary | ICD-10-CM | POA: Insufficient documentation

## 2015-11-18 ENCOUNTER — Other Ambulatory Visit: Payer: Self-pay | Admitting: *Deleted

## 2015-11-18 ENCOUNTER — Telehealth: Payer: Self-pay | Admitting: Oncology

## 2015-11-18 ENCOUNTER — Telehealth: Payer: Self-pay | Admitting: Pharmacist

## 2015-11-18 DIAGNOSIS — C20 Malignant neoplasm of rectum: Secondary | ICD-10-CM

## 2015-11-18 MED ORDER — CAPECITABINE 500 MG PO TABS: 1500.0000 mg | ORAL_TABLET | Freq: Two times a day (BID) | ORAL | Status: DC

## 2015-11-18 NOTE — Telephone Encounter (Signed)
spoke w/ pt confirmed 6/22 apt times

## 2015-11-18 NOTE — Addendum Note (Signed)
Addended by: Brien Few on: 11/18/2015 03:57 PM   Modules accepted: Orders

## 2015-11-18 NOTE — Telephone Encounter (Signed)
New Xeloda Rx faxed to [facility]. Confirmation received. Pt expected to start treatment on 11/25/15

## 2015-11-19 ENCOUNTER — Telehealth: Payer: Self-pay | Admitting: *Deleted

## 2015-11-19 ENCOUNTER — Telehealth: Payer: Self-pay | Admitting: Pharmacist

## 2015-11-19 NOTE — Telephone Encounter (Signed)
Oncology Nurse Navigator Documentation  Oncology Nurse Navigator Flowsheets 11/19/2015  Navigator Location [facility]  Navigator Encounter Type Telephone  Telephone Outgoing Call;Medication Assistance  Abnormal Finding Date -  Confirmed Diagnosis Date -  Patient Visit Type -  Treatment Phase -  Barriers/Navigation Needs -  Education -  Interventions -Called [facility] to follow up on status of Xeloda. Was told it requires a mail order specialty pharmacy. Was returned to [facility]. Per collaborative nurse, Raquel in managed care has the script working on the prior auth.  Referrals -  Coordination of Care -  Education Method -  Support Groups/Services -  Acuity -  Time Spent with Patient -

## 2015-11-20 ENCOUNTER — Ambulatory Visit (HOSPITAL_COMMUNITY)
Admission: RE | Admit: 2015-11-20 | Discharge: 2015-11-20 | Disposition: A | Discharge status: Home / Self Care | Payer: BLUE CROSS/BLUE SHIELD | Source: Ambulatory Visit | Attending: Family Medicine | Admitting: Family Medicine

## 2015-11-20 ENCOUNTER — Encounter: Payer: Self-pay | Admitting: Radiation Oncology

## 2015-11-20 ENCOUNTER — Encounter: Payer: Self-pay | Admitting: *Deleted

## 2015-11-20 ENCOUNTER — Telehealth: Payer: Self-pay | Admitting: Pharmacist

## 2015-11-20 DIAGNOSIS — I517 Cardiomegaly: Secondary | ICD-10-CM

## 2015-11-20 DIAGNOSIS — I340 Nonrheumatic mitral (valve) insufficiency: Secondary | ICD-10-CM | POA: Insufficient documentation

## 2015-11-20 DIAGNOSIS — I358 Other nonrheumatic aortic valve disorders: Secondary | ICD-10-CM | POA: Diagnosis not present

## 2015-11-20 DIAGNOSIS — I119 Hypertensive heart disease without heart failure: Secondary | ICD-10-CM | POA: Insufficient documentation

## 2015-11-20 LAB — ECHOCARDIOGRAM COMPLETE
CHL CUP MV DEC (S): 285
CHL CUP STROKE VOLUME: 45 mL
EERAT: 6.67
EWDT: 285 ms
FS: 46 % — AB (ref 28–44)
IV/PV OW: 0.92
LA ID, A-P, ES: 39 mm
LA vol index: 25.2 mL/m2
LA vol: 49.5 mL
LADIAMINDEX: 1.99 cm/m2
LAVOLA4C: 41 mL
LDCA: 3.46 cm2
LEFT ATRIUM END SYS DIAM: 39 mm
LV PW d: 9.73 mm — AB (ref 0.6–1.1)
LV SIMPSON'S DISK: 59
LV TDI E'LATERAL: 11.1
LV dias vol index: 39 mL/m2
LV sys vol index: 16 mL/m2
LVDIAVOL: 77 mL (ref 46–106)
LVEEAVG: 6.67
LVEEMED: 6.67
LVELAT: 11.1 cm/s
LVOTD: 21 mm
LVSYSVOL: 32 mL (ref 14–42)
MV pk E vel: 74 m/s
MVPG: 2 mmHg
MVPKAVEL: 77.1 m/s
RV LATERAL S' VELOCITY: 13.2 cm/s
TDI e' medial: 7.51

## 2015-11-20 NOTE — Progress Notes (Signed)
Call received from [facility] at [facility] to confirm Dr. Gearldine Shown signature and to obtain duration of radiation for patient.  Per Dr. Ida Rogue note, radiation duration 5 1/2 weeks.  Information given to [facility].

## 2015-11-20 NOTE — Progress Notes (Signed)
GI Location of Tumor / Histology: Rectum Stage III T3N1   [name] presented  months ago with symptoms of: Rectal Bleeding  Biopsies of  (if applicable) revealed: [id number]: 1. Colon,bx,ascending polyp-Tubular adenoma,no high grade dysplasia or malignancy identified 2.Colon,bx,right and left=benigncolonic mucosa with scattered lymphoid aggregates 3. Rectum,bx= Invasive Adenocarcinoma  Past/Anticipated interventions by surgeon, if any: Colonoscopy 10/29/15 mass noted in rectum,  Past/Anticipated interventions by medical oncology, if any: Dr. Learta Codding, MD seen in [facility] ,scheduled chemotherapy class 11/21/15   Weight changes, if any:   Bowel/Bladder complaints, if any:   Nausea / Vomiting, if any:   Pain issues, if any:    Any blood per rectum:     SAFETY ISSUES:  Prior radiation? NO  Pacemaker/ICD? NO  Possible current pregnancy? NO  Is the patient on methotrexate?   Current Complaints/Details: Divorced, Depression,G3 P3 ,non smoker, no tobacco products used, occasional alcohol no  illicit drug use  Mother thyroid disease, Father Lung cancer, ,Brother Cirrhosis, cousin had colon cancer in his 80's    Allergies: NKA

## 2015-11-20 NOTE — Telephone Encounter (Addendum)
Received fax communication from [place] that Destiny [name] has been approved. Effective dates: 11/19/2015 - 11/17/2016 Reference #:  [id number]  Prescription faxed to [place] @ [phone]. Fax confirmation received. [facility] Phone: [phone]

## 2015-11-20 NOTE — Telephone Encounter (Signed)
PA submitted for Xeloda on 11/19/15. [employer] called on 11/19/15 requesting additional medical information. Additional MD office notes faxed to [phone]. Received confirmation of fax transmission on 11/19/15 @ 4:18pm.  11/20/15 @11 :45am I called [employer] to make sure the requested information was received. Spoke with [place] L on 11/20/15. She verified the needed medical information was received. The PA is with the pharmacist for review. [place] did confirm that the PA has been marked as URGENT.

## 2015-11-21 ENCOUNTER — Other Ambulatory Visit: Payer: BLUE CROSS/BLUE SHIELD

## 2015-11-21 ENCOUNTER — Encounter: Payer: Self-pay | Admitting: *Deleted

## 2015-11-21 ENCOUNTER — Telehealth: Payer: Self-pay | Admitting: *Deleted

## 2015-11-21 ENCOUNTER — Other Ambulatory Visit (HOSPITAL_BASED_OUTPATIENT_CLINIC_OR_DEPARTMENT_OTHER): Payer: BLUE CROSS/BLUE SHIELD

## 2015-11-21 ENCOUNTER — Other Ambulatory Visit: Payer: Self-pay | Admitting: *Deleted

## 2015-11-21 DIAGNOSIS — C20 Malignant neoplasm of rectum: Secondary | ICD-10-CM

## 2015-11-21 LAB — COMPREHENSIVE METABOLIC PANEL
ALT: 12 U/L (ref 0–55)
AST: 11 U/L (ref 5–34)
Albumin: 3.4 g/dL — ABNORMAL LOW (ref 3.5–5.0)
Alkaline Phosphatase: 131 U/L (ref 40–150)
Anion Gap: 7 mEq/L (ref 3–11)
BUN: 10.4 mg/dL (ref 7.0–26.0)
CO2: 25 meq/L (ref 22–29)
Calcium: 8.9 mg/dL (ref 8.4–10.4)
Chloride: 107 mEq/L (ref 98–109)
Creatinine: 0.7 mg/dL (ref 0.6–1.1)
GLUCOSE: 110 mg/dL (ref 70–140)
POTASSIUM: 4.3 meq/L (ref 3.5–5.1)
SODIUM: 139 meq/L (ref 136–145)
TOTAL PROTEIN: 7.1 g/dL (ref 6.4–8.3)
Total Bilirubin: 0.38 mg/dL (ref 0.20–1.20)

## 2015-11-21 LAB — CBC WITH DIFFERENTIAL/PLATELET
BASO%: 0.3 % (ref 0.0–2.0)
Basophils Absolute: 0 10*3/uL (ref 0.0–0.1)
EOS ABS: 0.1 10*3/uL (ref 0.0–0.5)
EOS%: 2 % (ref 0.0–7.0)
HCT: 34.8 % (ref 34.8–46.6)
HGB: 11.9 g/dL (ref 11.6–15.9)
LYMPH%: 23 % (ref 14.0–49.7)
MCH: 26.1 pg (ref 25.1–34.0)
MCHC: 34.1 g/dL (ref 31.5–36.0)
MCV: 76.5 fL — AB (ref 79.5–101.0)
MONO#: 0.7 10*3/uL (ref 0.1–0.9)
MONO%: 9.1 % (ref 0.0–14.0)
NEUT%: 65.6 % (ref 38.4–76.8)
NEUTROS ABS: 4.8 10*3/uL (ref 1.5–6.5)
Platelets: 324 10*3/uL (ref 145–400)
RBC: 4.55 10*6/uL (ref 3.70–5.45)
RDW: 14.1 % (ref 11.2–14.5)
WBC: 7.3 10*3/uL (ref 3.9–10.3)
lymph#: 1.7 10*3/uL (ref 0.9–3.3)

## 2015-11-21 NOTE — Telephone Encounter (Signed)
Oncology Nurse Navigator Documentation  Oncology Nurse Navigator Flowsheets 11/21/2015  Navigator Location [facility]  Navigator Encounter Type Telephone  Telephone Medication Assistance-Xeloda  Abnormal Finding Date -  Confirmed Diagnosis Date -  Patient Visit Type -  Treatment Phase -  Barriers/Navigation Needs -  Education -  Interventions -Documented name/contact # of pharmacy for patient later today or tomorrow am to call for delivery arrangement.  Referrals -  Coordination of Care -Called pharmacy to follow up on status of Xeloda script. Was told it was still in process and should be ready by end of day today. Representative could not give copay at this time.  Education Method -  Support Groups/Services -  Acuity -  Time Spent with Patient -   Pharmacy ([facility]) called back to report the script was filled by [facility] today. This RN called [facility] and was told the script is ready for patient to call to arrange delivery with no co pay. Notified education nurse to give patient contact # for [facility] to call today and arrange for delivery.

## 2015-11-22 ENCOUNTER — Telehealth: Payer: Self-pay | Admitting: Pharmacist

## 2015-11-22 NOTE — Telephone Encounter (Signed)
11-22-15: Attempted to reach patient for initial counseling and confirmation of delivery on oral medication: Xeloda. No answer. Unable to leave patient a message as voicemail was not set up. Will try to follow-up again with patient.  Thank you,  Skip Mayer, PharmD, [facility] [phone]

## 2015-11-22 NOTE — Telephone Encounter (Signed)
11/21/15: Xeloda Rx update - informed by nurse navigator that Xeloda Rx has been filled with [facility] with a zero copay. It was ready to ship on 11/21/15. Will f/u with patient for drug shipment receipt and initial counseling.

## 2015-11-25 ENCOUNTER — Telehealth: Payer: Self-pay

## 2015-11-25 ENCOUNTER — Other Ambulatory Visit: Payer: Self-pay | Admitting: Family Medicine

## 2015-11-25 ENCOUNTER — Ambulatory Visit
Admission: RE | Admit: 2015-11-25 | Discharge: 2015-11-25 | Disposition: A | Discharge status: Home / Self Care | Payer: BLUE CROSS/BLUE SHIELD | Source: Ambulatory Visit | Attending: Radiation Oncology | Admitting: Radiation Oncology

## 2015-11-25 DIAGNOSIS — Z510 Encounter for antineoplastic radiation therapy: Secondary | ICD-10-CM | POA: Diagnosis not present

## 2015-11-25 HISTORY — DX: Malignant (primary) neoplasm, unspecified: C80.1

## 2015-11-25 NOTE — Telephone Encounter (Signed)
..  Oral Chemotherapy Pharmacist Encounter   I spoke with patient for overview of new oral chemotherapy medication: Xeloda.  Pt states she took her first dose this morning and will have radiation therapy starting her first round today.   Counseled patient on administration, dosing, side effects, safe handling, and monitoring. If she has any questions or concerns to contact our office for additional information.  All questions answered.  Will follow up in 1-2 weeks for adherence and toxicity management.   Thank you,  Henreitta Leber, PharmD [facility]

## 2015-11-26 ENCOUNTER — Ambulatory Visit
Admission: RE | Admit: 2015-11-26 | Discharge: 2015-11-26 | Disposition: A | Discharge status: Home / Self Care | Payer: BLUE CROSS/BLUE SHIELD | Source: Ambulatory Visit | Attending: Radiation Oncology | Admitting: Radiation Oncology

## 2015-11-26 DIAGNOSIS — Z510 Encounter for antineoplastic radiation therapy: Secondary | ICD-10-CM | POA: Diagnosis not present

## 2015-11-27 ENCOUNTER — Ambulatory Visit
Admission: RE | Admit: 2015-11-27 | Discharge: 2015-11-27 | Disposition: A | Discharge status: Home / Self Care | Payer: BLUE CROSS/BLUE SHIELD | Source: Ambulatory Visit | Attending: Radiation Oncology | Admitting: Radiation Oncology

## 2015-11-27 DIAGNOSIS — Z510 Encounter for antineoplastic radiation therapy: Secondary | ICD-10-CM | POA: Diagnosis not present

## 2015-11-28 ENCOUNTER — Ambulatory Visit
Admission: RE | Admit: 2015-11-28 | Discharge: 2015-11-28 | Disposition: A | Discharge status: Home / Self Care | Payer: BLUE CROSS/BLUE SHIELD | Source: Ambulatory Visit | Attending: Radiation Oncology | Admitting: Radiation Oncology

## 2015-11-28 DIAGNOSIS — Z510 Encounter for antineoplastic radiation therapy: Secondary | ICD-10-CM | POA: Diagnosis not present

## 2015-11-29 ENCOUNTER — Encounter: Payer: Self-pay | Admitting: Radiation Oncology

## 2015-11-29 ENCOUNTER — Ambulatory Visit
Admission: RE | Admit: 2015-11-29 | Discharge: 2015-11-29 | Disposition: A | Discharge status: Home / Self Care | Payer: BLUE CROSS/BLUE SHIELD | Source: Ambulatory Visit | Attending: Radiation Oncology | Admitting: Radiation Oncology

## 2015-11-29 VITALS — BP 125/61 | HR 91 | Temp 98.0°F | Resp 20 | Wt 193.3 lb

## 2015-11-29 DIAGNOSIS — C20 Malignant neoplasm of rectum: Secondary | ICD-10-CM

## 2015-11-29 DIAGNOSIS — Z510 Encounter for antineoplastic radiation therapy: Secondary | ICD-10-CM | POA: Diagnosis not present

## 2015-11-29 MED ORDER — PROCHLORPERAZINE MALEATE 10 MG PO TABS: 10.0000 mg | ORAL_TABLET | Freq: Four times a day (QID) | ORAL | Status: DC | PRN

## 2015-11-29 NOTE — Progress Notes (Addendum)
Weekly rad txs rectum 5/28 completd, patient education done today, discussed ways to manage side effects,, nm=may need to eat 5-6 smaller meals, have baby wis, sitz baths nausea, vomiting, diarrhea, imidium prn, low fiber diet, skin irritation, fatigue, bladder changes, loss appetite,  Patient has swimmy head today and nausea, fatiued,  Gave radiation thrapy and you book, my business card, verbal understanding, teach back , Has started taking Xeloda this past Monday bid 3:04 PM BP 125/61 mmHg  Pulse 91  Temp(Src) 98 F (36.7 C) (Oral)  Resp 20  Wt 193 lb 4.8 oz (87.68 kg)  SpO2 99%  LMP  (LMP Unknown)  Wt Readings from Last 3 Encounters:  11/29/15 193 lb 4.8 oz (87.68 kg)  11/15/15 187 lb 11.2 oz (85.14 kg)  11/12/15 187 lb (84.823 kg)

## 2015-11-29 NOTE — Progress Notes (Signed)
Department of Radiation Oncology  Phone:  [phone] Fax:        [phone]  Weekly Treatment Note    Name: Destiny [name] Date: 11/29/2015 MRN: [id number] DOB: 28-Jan-1966   Diagnosis:     ICD-9-CM ICD-10-CM   1. Malignant neoplasm of rectum (HCC) 154.1 C20      Current dose: 9 Gy  Current fraction:5   MEDICATIONS: Current Outpatient Prescriptions  Medication Sig Dispense Refill  . acetaminophen (TYLENOL) 500 MG tablet Take 1,000 mg by mouth every 8 (eight) hours as needed for moderate pain.    . capecitabine (XELODA) 500 MG tablet Take 3 tablets (1,500 mg total) by mouth 2 (two) times daily after a meal. On days of radiation only (Mon-Fri) 168 tablet 0  . ferrous sulfate 325 (65 FE) MG tablet Take 325 mg by mouth daily with breakfast.    . gabapentin (NEURONTIN) 300 MG capsule 1 CAPSULE TODAY, TWICE A DAY FRIDAY, THEN 3 TIMES A DAY FOR 30 DAYS 93 capsule 0  . hydrochlorothiazide (HYDRODIURIL) 12.5 MG tablet Take 1 tablet (12.5 mg total) by mouth daily. 90 tablet 3  . LORazepam (ATIVAN) 0.5 MG tablet TAKE  (1)  TABLET TWICE A DAY. 60 tablet 1  . methimazole (TAPAZOLE) 10 MG tablet Take 4 tablets (40 mg total) by mouth 2 (two) times daily. 240 tablet 2  . sertraline (ZOLOFT) 50 MG tablet Take 1 tablet (50 mg total) by mouth daily. 30 tablet 3  . prochlorperazine (COMPAZINE) 10 MG tablet Take 1 tablet (10 mg total) by mouth every 6 (six) hours as needed for nausea or vomiting. 30 tablet 1   No current facility-administered medications for this encounter.     ALLERGIES: Review of patient's allergies indicates no known allergies.   LABORATORY DATA:  Lab Results  Component Value Date   WBC 7.3 11/21/2015   HGB 11.9 11/21/2015   HCT 34.8 11/21/2015   MCV 76.5* 11/21/2015   PLT 324 11/21/2015   Lab Results  Component Value Date   NA 139 11/21/2015   K 4.3 11/21/2015   CL 101 02/15/2015   CO2 25 11/21/2015   Lab Results  Component Value Date   ALT 12  11/21/2015   AST 11 11/21/2015   ALKPHOS 131 11/21/2015   BILITOT 0.38 11/21/2015     NARRATIVE: Destiny [name] was seen today for weekly treatment management. The chart was checked and the patient's films were reviewed.  Weekly rad txs rectum 5/28 completd, patient education done today, discussed ways to manage side effects,, nm=may need to eat 5-6 smaller meals, have baby wis, sitz baths nausea, vomiting, diarrhea, imidium prn, low fiber diet, skin irritation, fatigue, bladder changes, loss appetite,  Patient has swimmy head today and nausea, fatiued,  Gave radiation thrapy and you book, my business card, verbal understanding, teach back , Has started taking Xeloda this past Monday bid 5:17 PM BP 125/61 mmHg  Pulse 91  Temp(Src) 98 F (36.7 C) (Oral)  Resp 20  Wt 193 lb 4.8 oz (87.68 kg)  SpO2 99%  LMP  (LMP Unknown)  Wt Readings from Last 3 Encounters:  11/29/15 193 lb 4.8 oz (87.68 kg)  11/15/15 187 lb 11.2 oz (85.14 kg)  11/12/15 187 lb (84.823 kg)    PHYSICAL EXAMINATION: weight is 193 lb 4.8 oz (87.68 kg). Her oral temperature is 98 F (36.7 C). Her blood pressure is 125/61 and her pulse is 91. Her respiration is 20 and oxygen saturation is 99%.  ASSESSMENT: The patient is doing satisfactorily with treatment.  PLAN: We will continue with the patient's radiation treatment as planned.

## 2015-11-29 NOTE — Progress Notes (Signed)
Paperwork (fmla) for daughter received 6/30, given to nurse 7/3

## 2015-12-02 ENCOUNTER — Ambulatory Visit
Admission: RE | Admit: 2015-12-02 | Discharge: 2015-12-02 | Disposition: A | Discharge status: Home / Self Care | Payer: BLUE CROSS/BLUE SHIELD | Source: Ambulatory Visit | Attending: Radiation Oncology | Admitting: Radiation Oncology

## 2015-12-02 DIAGNOSIS — Z510 Encounter for antineoplastic radiation therapy: Secondary | ICD-10-CM | POA: Diagnosis not present

## 2015-12-04 ENCOUNTER — Ambulatory Visit
Admission: RE | Admit: 2015-12-04 | Discharge: 2015-12-04 | Disposition: A | Discharge status: Home / Self Care | Payer: BLUE CROSS/BLUE SHIELD | Source: Ambulatory Visit | Attending: Radiation Oncology | Admitting: Radiation Oncology

## 2015-12-04 ENCOUNTER — Telehealth: Payer: Self-pay | Admitting: *Deleted

## 2015-12-04 DIAGNOSIS — Z510 Encounter for antineoplastic radiation therapy: Secondary | ICD-10-CM | POA: Diagnosis not present

## 2015-12-04 NOTE — Telephone Encounter (Signed)
Gave FMLA papers to MD placed in his in box 12:47 PM

## 2015-12-05 ENCOUNTER — Ambulatory Visit
Admission: RE | Admit: 2015-12-05 | Discharge: 2015-12-05 | Disposition: A | Discharge status: Home / Self Care | Payer: BLUE CROSS/BLUE SHIELD | Source: Ambulatory Visit | Attending: Radiation Oncology | Admitting: Radiation Oncology

## 2015-12-05 DIAGNOSIS — Z510 Encounter for antineoplastic radiation therapy: Secondary | ICD-10-CM | POA: Diagnosis not present

## 2015-12-06 ENCOUNTER — Ambulatory Visit
Admission: RE | Admit: 2015-12-06 | Discharge: 2015-12-06 | Disposition: A | Discharge status: Home / Self Care | Payer: BLUE CROSS/BLUE SHIELD | Source: Ambulatory Visit | Attending: Radiation Oncology | Admitting: Radiation Oncology

## 2015-12-06 ENCOUNTER — Encounter: Payer: Self-pay | Admitting: Radiation Oncology

## 2015-12-06 VITALS — BP 129/72 | HR 80 | Temp 98.1°F | Resp 20 | Wt 197.7 lb

## 2015-12-06 DIAGNOSIS — Z510 Encounter for antineoplastic radiation therapy: Secondary | ICD-10-CM | POA: Diagnosis not present

## 2015-12-06 DIAGNOSIS — C20 Malignant neoplasm of rectum: Secondary | ICD-10-CM

## 2015-12-06 NOTE — Progress Notes (Signed)
Department of Radiation Oncology  Phone:  [phone] Fax:        [phone]  Weekly Treatment Note    Name: Destiny [name] Date: 12/06/2015 MRN: [id number] DOB: August 22, 1965   Diagnosis:     ICD-9-CM ICD-10-CM   1. Malignant neoplasm of rectum (HCC) 154.1 C20      Current dose: 16.2 Gy  Current fraction: 9   MEDICATIONS: Current Outpatient Prescriptions  Medication Sig Dispense Refill  . acetaminophen (TYLENOL) 500 MG tablet Take 1,000 mg by mouth every 8 (eight) hours as needed for moderate pain.    . capecitabine (XELODA) 500 MG tablet Take 3 tablets (1,500 mg total) by mouth 2 (two) times daily after a meal. On days of radiation only (Mon-Fri) 168 tablet 0  . ferrous sulfate 325 (65 FE) MG tablet Take 325 mg by mouth daily with breakfast.    . gabapentin (NEURONTIN) 300 MG capsule 1 CAPSULE TODAY, TWICE A DAY FRIDAY, THEN 3 TIMES A DAY FOR 30 DAYS 93 capsule 0  . hydrochlorothiazide (HYDRODIURIL) 12.5 MG tablet Take 1 tablet (12.5 mg total) by mouth daily. 90 tablet 3  . LORazepam (ATIVAN) 0.5 MG tablet TAKE  (1)  TABLET TWICE A DAY. 60 tablet 1  . methimazole (TAPAZOLE) 10 MG tablet Take 4 tablets (40 mg total) by mouth 2 (two) times daily. 240 tablet 2  . prochlorperazine (COMPAZINE) 10 MG tablet Take 1 tablet (10 mg total) by mouth every 6 (six) hours as needed for nausea or vomiting. 30 tablet 1  . sertraline (ZOLOFT) 50 MG tablet Take 1 tablet (50 mg total) by mouth daily. 30 tablet 3   No current facility-administered medications for this encounter.     ALLERGIES: Review of patient's allergies indicates no known allergies.   LABORATORY DATA:  Lab Results  Component Value Date   WBC 7.3 11/21/2015   HGB 11.9 11/21/2015   HCT 34.8 11/21/2015   MCV 76.5* 11/21/2015   PLT 324 11/21/2015   Lab Results  Component Value Date   NA 139 11/21/2015   K 4.3 11/21/2015   CL 101 02/15/2015   CO2 25 11/21/2015   Lab Results  Component Value Date   ALT 12  11/21/2015   AST 11 11/21/2015   ALKPHOS 131 11/21/2015   BILITOT 0.38 11/21/2015     NARRATIVE: Destiny [name] was seen today for weekly treatment management. The chart was checked and the patient's films were reviewed.  Weekly rad txs rectum 9/28 completed. No diarrhea but muscous bloodly streaks in stool and on tissue stated. She denies any increase in bleeding, mostly unchanged. Reports abdominal gas. Reports slight nausea at times managed with compazine prn. Xeloda bid. Appetite good. Notes a gain in 4 lbs this week.    PHYSICAL EXAMINATION: weight is 197 lb 11.2 oz (89.676 kg). Her oral temperature is 98.1 F (36.7 C). Her blood pressure is 129/72 and her pulse is 80. Her respiration is 20.      Alert. In no acute distress.  ASSESSMENT: The patient is doing satisfactorily with treatment.  PLAN: We will continue with the patient's radiation treatment as planned.     ------------------------------------------------  Jodelle Gross, MD, PhD  This document serves as a record of services personally performed by Kyung Rudd, MD. It was created on his behalf by Arlyce Harman, a trained medical scribe. The creation of this record is based on the scribe's personal observations and the provider's statements to them. This document has been  checked and approved by the attending provider.

## 2015-12-06 NOTE — Progress Notes (Signed)
BP 129/72 mmHg  Pulse 80  Temp(Src) 98.1 F (36.7 C) (Oral)  Resp 20  Wt 197 lb 11.2 oz (89.676 kg)  LMP  (LMP Unknown)  Wt Readings from Last 3 Encounters:  12/06/15 197 lb 11.2 oz (89.676 kg)  11/29/15 193 lb 4.8 oz (87.68 kg)  11/15/15 187 lb 11.2 oz (85.14 kg)   weekly rad txs rectum 9/28 completed, no diarrhea but muscous bloodly streaks in stool and on tissue stated, abdominal gas, slight nausea at times, takes compazine prn, xeloda bid, appetite good 2:57 PM

## 2015-12-09 ENCOUNTER — Ambulatory Visit
Admission: RE | Admit: 2015-12-09 | Discharge: 2015-12-09 | Disposition: A | Discharge status: Home / Self Care | Payer: BLUE CROSS/BLUE SHIELD | Source: Ambulatory Visit | Attending: Radiation Oncology | Admitting: Radiation Oncology

## 2015-12-09 DIAGNOSIS — Z510 Encounter for antineoplastic radiation therapy: Secondary | ICD-10-CM | POA: Diagnosis not present

## 2015-12-10 ENCOUNTER — Ambulatory Visit
Admission: RE | Admit: 2015-12-10 | Discharge: 2015-12-10 | Disposition: A | Discharge status: Home / Self Care | Payer: BLUE CROSS/BLUE SHIELD | Source: Ambulatory Visit | Attending: Radiation Oncology | Admitting: Radiation Oncology

## 2015-12-10 DIAGNOSIS — Z510 Encounter for antineoplastic radiation therapy: Secondary | ICD-10-CM | POA: Diagnosis not present

## 2015-12-11 ENCOUNTER — Ambulatory Visit
Admission: RE | Admit: 2015-12-11 | Discharge: 2015-12-11 | Disposition: A | Discharge status: Home / Self Care | Payer: BLUE CROSS/BLUE SHIELD | Source: Ambulatory Visit | Attending: Radiation Oncology | Admitting: Radiation Oncology

## 2015-12-11 DIAGNOSIS — Z510 Encounter for antineoplastic radiation therapy: Secondary | ICD-10-CM | POA: Diagnosis not present

## 2015-12-12 ENCOUNTER — Ambulatory Visit
Admission: RE | Admit: 2015-12-12 | Discharge: 2015-12-12 | Disposition: A | Discharge status: Home / Self Care | Payer: BLUE CROSS/BLUE SHIELD | Source: Ambulatory Visit | Attending: Radiation Oncology | Admitting: Radiation Oncology

## 2015-12-12 DIAGNOSIS — Z510 Encounter for antineoplastic radiation therapy: Secondary | ICD-10-CM | POA: Diagnosis not present

## 2015-12-13 ENCOUNTER — Encounter: Payer: Self-pay | Admitting: *Deleted

## 2015-12-13 ENCOUNTER — Ambulatory Visit
Admission: RE | Admit: 2015-12-13 | Discharge: 2015-12-13 | Disposition: A | Discharge status: Home / Self Care | Payer: BLUE CROSS/BLUE SHIELD | Source: Ambulatory Visit | Attending: Radiation Oncology | Admitting: Radiation Oncology

## 2015-12-13 ENCOUNTER — Telehealth: Payer: Self-pay | Admitting: Pharmacist

## 2015-12-13 ENCOUNTER — Encounter: Payer: Self-pay | Admitting: Radiation Oncology

## 2015-12-13 ENCOUNTER — Ambulatory Visit: Payer: BLUE CROSS/BLUE SHIELD | Admitting: Endocrinology

## 2015-12-13 VITALS — BP 141/66 | HR 78 | Temp 98.0°F | Resp 18 | Wt 198.8 lb

## 2015-12-13 DIAGNOSIS — C20 Malignant neoplasm of rectum: Secondary | ICD-10-CM

## 2015-12-13 DIAGNOSIS — Z510 Encounter for antineoplastic radiation therapy: Secondary | ICD-10-CM | POA: Diagnosis not present

## 2015-12-13 NOTE — Telephone Encounter (Signed)
Oral Chemotherapy Follow-Up Form  Original Start date of oral chemotherapy: 11/25/15  Called patient today to follow up regarding patient's oral chemotherapy medication: Xeloda  Pt reports 0 tablets/doses missed  Pt reports the following side effects:  Leg cramps - occurs almost daily/during the night.  She "walks it out."  She is drinking "plenty."  She drinks water, caffeine-free Pepsi and tea.  I encouraged her to increase water intake. Mild nausea - had to use Compazine once. Increase stools - not diarrhea.  She has 4-5 soft-formed stools daily.  She doesn't go a lot each time she has BM.  Denies hand/foot syndrome.  Her last sched XRT is 01/02/16 & she sees Dr. Benay Spice on 01/06/16.  Will follow up and call patient again in 2 weeks.  Thank you, Kennith Center, Pharm.D., CPP 12/13/2015@3 :52 PM Oral Chemotherapy Clinic

## 2015-12-13 NOTE — Progress Notes (Signed)
Oncology Nurse Navigator Documentation  Oncology Nurse Navigator Flowsheets 12/13/2015  Navigator Location [facility]  Navigator Encounter Type Letter/Fax/Email  Telephone -  Abnormal Finding Date -  Confirmed Diagnosis Date -  Patient Visit Type -  Treatment Phase Active Tx  Barriers/Navigation Needs Coordination of Care--Needs F/U appointment with medical oncology  Education -  Interventions Coordination of Care--made scheduling supervisor aware of need to be seen on 7/18 by NP  Referrals -  Coordination of Care Appts  Education Method -  Support Groups/Services -  Acuity Level 1  Time Spent with Patient 15  Informed by pharmacy that the appointment MD ordered on 6/16 for 7/7 had not been scheduled.

## 2015-12-13 NOTE — Progress Notes (Signed)
Weekly rad txs rectum 14/28   Completed, burns occasionally with bowel movements, having 4-5 loose stools and mucous in stool and at times blood in stool,  Appetite good, no nausea, takes xeloda bid,  Has abdominal gas, takes gax ex helps, irritated bottom, nos kin irritation seen no broken skin , using baby wipes, , sitz bath offered to help irritated bottom  2:57 PM BP 141/66 mmHg  Pulse 78  Temp(Src) 98 F (36.7 C) (Other (Comment))  Resp 18  Wt 198 lb 12.8 oz (90.175 kg)  LMP  (LMP Unknown)  Wt Readings from Last 3 Encounters:  12/13/15 198 lb 12.8 oz (90.175 kg)  12/06/15 197 lb 11.2 oz (89.676 kg)  11/29/15 193 lb 4.8 oz (87.68 kg)

## 2015-12-14 ENCOUNTER — Encounter: Payer: Self-pay | Admitting: Radiation Oncology

## 2015-12-14 NOTE — Progress Notes (Signed)
Department of Radiation Oncology  Phone:  [phone] Fax:        [phone]  Weekly Treatment Note    Name: Destiny [name] Date: 12/14/2015 MRN: [id number] DOB: 1966-01-07   Diagnosis:     ICD-9-CM ICD-10-CM   1. Malignant neoplasm of rectum (HCC) 154.1 C20      Current dose: 25.2 Gy  Current fraction: 14   MEDICATIONS: Current Outpatient Prescriptions  Medication Sig Dispense Refill  . acetaminophen (TYLENOL) 500 MG tablet Take 1,000 mg by mouth every 8 (eight) hours as needed for moderate pain.    . capecitabine (XELODA) 500 MG tablet Take 3 tablets (1,500 mg total) by mouth 2 (two) times daily after a meal. On days of radiation only (Mon-Fri) 168 tablet 0  . ferrous sulfate 325 (65 FE) MG tablet Take 325 mg by mouth daily with breakfast.    . gabapentin (NEURONTIN) 300 MG capsule 1 CAPSULE TODAY, TWICE A DAY FRIDAY, THEN 3 TIMES A DAY FOR 30 DAYS 93 capsule 0  . hydrochlorothiazide (HYDRODIURIL) 12.5 MG tablet Take 1 tablet (12.5 mg total) by mouth daily. 90 tablet 3  . LORazepam (ATIVAN) 0.5 MG tablet TAKE  (1)  TABLET TWICE A DAY. 60 tablet 1  . methimazole (TAPAZOLE) 10 MG tablet Take 4 tablets (40 mg total) by mouth 2 (two) times daily. 240 tablet 2  . prochlorperazine (COMPAZINE) 10 MG tablet Take 1 tablet (10 mg total) by mouth every 6 (six) hours as needed for nausea or vomiting. 30 tablet 1  . sertraline (ZOLOFT) 50 MG tablet Take 1 tablet (50 mg total) by mouth daily. 30 tablet 3   No current facility-administered medications for this encounter.     ALLERGIES: Review of patient's allergies indicates no known allergies.   LABORATORY DATA:  Lab Results  Component Value Date   WBC 7.3 11/21/2015   HGB 11.9 11/21/2015   HCT 34.8 11/21/2015   MCV 76.5* 11/21/2015   PLT 324 11/21/2015   Lab Results  Component Value Date   NA 139 11/21/2015   K 4.3 11/21/2015   CL 101 02/15/2015   CO2 25 11/21/2015   Lab Results  Component Value Date   ALT 12  11/21/2015   AST 11 11/21/2015   ALKPHOS 131 11/21/2015   BILITOT 0.38 11/21/2015     NARRATIVE: Destiny [name] was seen today for weekly treatment management. The chart was checked and the patient's films were reviewed.  Weekly rad txs rectum 14/28   Completed, burns occasionally with bowel movements, having 4-5 loose stools and mucous in stool and at times blood in stool,  Appetite good, no nausea, takes xeloda bid,  Has abdominal gas, takes gax ex helps, irritated bottom, nos kin irritation seen no broken skin , using baby wipes, , sitz bath offered to help irritated bottom  8:28 PM BP 141/66 mmHg  Pulse 78  Temp(Src) 98 F (36.7 C) (Other (Comment))  Resp 18  Wt 198 lb 12.8 oz (90.175 kg)  LMP  (LMP Unknown)  Wt Readings from Last 3 Encounters:  12/13/15 198 lb 12.8 oz (90.175 kg)  12/06/15 197 lb 11.2 oz (89.676 kg)  11/29/15 193 lb 4.8 oz (87.68 kg)    PHYSICAL EXAMINATION: weight is 198 lb 12.8 oz (90.175 kg). Her other (comment) temperature is 98 F (36.7 C). Her blood pressure is 141/66 and her pulse is 78. Her respiration is 18.        ASSESSMENT: The patient is doing satisfactorily  with treatment.  PLAN: We will continue with the patient's radiation treatment as planned.

## 2015-12-16 ENCOUNTER — Encounter: Payer: Self-pay | Admitting: Radiation Oncology

## 2015-12-16 ENCOUNTER — Ambulatory Visit
Admission: RE | Admit: 2015-12-16 | Discharge: 2015-12-16 | Disposition: A | Discharge status: Home / Self Care | Payer: BLUE CROSS/BLUE SHIELD | Source: Ambulatory Visit | Attending: Radiation Oncology | Admitting: Radiation Oncology

## 2015-12-16 VITALS — BP 125/66 | HR 70 | Temp 97.4°F | Resp 20

## 2015-12-16 DIAGNOSIS — M545 Low back pain, unspecified: Secondary | ICD-10-CM

## 2015-12-16 DIAGNOSIS — Z510 Encounter for antineoplastic radiation therapy: Secondary | ICD-10-CM | POA: Diagnosis not present

## 2015-12-16 MED ORDER — HYDROCODONE-ACETAMINOPHEN 5-325 MG PO TABS: 1.0000 | ORAL_TABLET | Freq: Four times a day (QID) | ORAL | Status: DC | PRN

## 2015-12-16 NOTE — Progress Notes (Signed)
Patient request rx for pain , tylenol and neurtoin not helping, pain 8/10 scale low back, vitals done 3:12 PM BP 125/66 mmHg  Pulse 70  Temp(Src) 97.4 F (36.3 C) (Oral)  Resp 20  LMP  (LMP Unknown)

## 2015-12-16 NOTE — Progress Notes (Signed)
Department of Radiation Oncology  Phone:  [phone] Fax:        [phone]  Weekly Treatment Note    Name: Destiny [name] Date: 12/16/2015 MRN: [id number] DOB: 06/30/1965   Diagnosis:     ICD-9-CM ICD-10-CM   1. Midline low back pain without sciatica 724.2 M54.5     Current fraction:15   MEDICATIONS: Current Outpatient Prescriptions  Medication Sig Dispense Refill  . acetaminophen (TYLENOL) 500 MG tablet Take 1,000 mg by mouth every 8 (eight) hours as needed for moderate pain.    . capecitabine (XELODA) 500 MG tablet Take 3 tablets (1,500 mg total) by mouth 2 (two) times daily after a meal. On days of radiation only (Mon-Fri) 168 tablet 0  . ferrous sulfate 325 (65 FE) MG tablet Take 325 mg by mouth daily with breakfast.    . gabapentin (NEURONTIN) 300 MG capsule 1 CAPSULE TODAY, TWICE A DAY FRIDAY, THEN 3 TIMES A DAY FOR 30 DAYS 93 capsule 0  . hydrochlorothiazide (HYDRODIURIL) 12.5 MG tablet Take 1 tablet (12.5 mg total) by mouth daily. 90 tablet 3  . HYDROcodone-acetaminophen (NORCO) 5-325 MG tablet Take 1 tablet by mouth every 6 (six) hours as needed for moderate pain. 30 tablet 0  . LORazepam (ATIVAN) 0.5 MG tablet TAKE  (1)  TABLET TWICE A DAY. 60 tablet 1  . methimazole (TAPAZOLE) 10 MG tablet Take 4 tablets (40 mg total) by mouth 2 (two) times daily. 240 tablet 2  . prochlorperazine (COMPAZINE) 10 MG tablet Take 1 tablet (10 mg total) by mouth every 6 (six) hours as needed for nausea or vomiting. 30 tablet 1  . sertraline (ZOLOFT) 50 MG tablet Take 1 tablet (50 mg total) by mouth daily. 30 tablet 3   No current facility-administered medications for this encounter.     ALLERGIES: Review of patient's allergies indicates no known allergies.   LABORATORY DATA:  Lab Results  Component Value Date   WBC 7.3 11/21/2015   HGB 11.9 11/21/2015   HCT 34.8 11/21/2015   MCV 76.5* 11/21/2015   PLT 324 11/21/2015   Lab Results  Component Value Date   NA 139 11/21/2015    K 4.3 11/21/2015   CL 101 02/15/2015   CO2 25 11/21/2015   Lab Results  Component Value Date   ALT 12 11/21/2015   AST 11 11/21/2015   ALKPHOS 131 11/21/2015   BILITOT 0.38 11/21/2015     NARRATIVE: Destiny [name] was seen today for a work in visit for back pain. She has noticed that she has had increasing back pain in the last 2 weeks. She denies any symptom progression from lying on the treatment table. She denies any neuropathy or radiculopathy. She describes this as a dull ache that does not radiate or improve with tylenol or gabapentin. She denies any worsening factors, and denies recent trauma.  PHYSICAL EXAMINATION: oral temperature is 97.4 F (36.3 C). Her blood pressure is 125/66 and her pulse is 70. Her respiration is 20.      In general this is a well appearing caucasian female in no acute distress. She's alert and oriented x4 and appropriate throughout the examination. Cardiopulmonary assessment is negative for acute distress and she exhibits normal effort. She has normal gait without visible abnormality and she appears to be grossly neurologically intact.   ASSESSMENT: Back pain of unknown origin.  PLAN: We will continue with the patient's radiation treatment as planned. She is given a prescription for Norco as tramadol  interacts with her Zoloft, and because she cannot take high doses of ibuprofen due to her rectal bleeding. We did discuss avoiding tylenol products. She may start to need pain medication more for her perianal area as she continues with treatment, and we will follow this closely. If her back pain does not improve with rest, pain management, however we would defer this back to her PCP as her pretreatment images did not indicate concern for metastatic disease.      Carola Rhine, PAC

## 2015-12-17 ENCOUNTER — Telehealth: Payer: Self-pay | Admitting: *Deleted

## 2015-12-17 ENCOUNTER — Ambulatory Visit: Payer: BLUE CROSS/BLUE SHIELD | Admitting: Nurse Practitioner

## 2015-12-17 ENCOUNTER — Ambulatory Visit
Admission: RE | Admit: 2015-12-17 | Discharge: 2015-12-17 | Disposition: A | Discharge status: Home / Self Care | Payer: BLUE CROSS/BLUE SHIELD | Source: Ambulatory Visit | Attending: Radiation Oncology | Admitting: Radiation Oncology

## 2015-12-17 ENCOUNTER — Other Ambulatory Visit: Payer: BLUE CROSS/BLUE SHIELD

## 2015-12-17 DIAGNOSIS — Z510 Encounter for antineoplastic radiation therapy: Secondary | ICD-10-CM | POA: Diagnosis not present

## 2015-12-17 NOTE — Telephone Encounter (Signed)
Tried to locate pt for missed Medonc appt. Pt has already completed radiation today and has left the building. Tried to reach pt on cell phone- no vm available.

## 2015-12-18 ENCOUNTER — Ambulatory Visit
Admission: RE | Admit: 2015-12-18 | Discharge: 2015-12-18 | Disposition: A | Discharge status: Home / Self Care | Payer: BLUE CROSS/BLUE SHIELD | Source: Ambulatory Visit | Attending: Radiation Oncology | Admitting: Radiation Oncology

## 2015-12-18 DIAGNOSIS — Z510 Encounter for antineoplastic radiation therapy: Secondary | ICD-10-CM | POA: Diagnosis not present

## 2015-12-19 ENCOUNTER — Ambulatory Visit
Admission: RE | Admit: 2015-12-19 | Discharge: 2015-12-19 | Disposition: A | Discharge status: Home / Self Care | Payer: BLUE CROSS/BLUE SHIELD | Source: Ambulatory Visit | Attending: Radiation Oncology | Admitting: Radiation Oncology

## 2015-12-19 ENCOUNTER — Other Ambulatory Visit: Payer: BLUE CROSS/BLUE SHIELD

## 2015-12-19 DIAGNOSIS — Z510 Encounter for antineoplastic radiation therapy: Secondary | ICD-10-CM | POA: Diagnosis not present

## 2015-12-20 ENCOUNTER — Ambulatory Visit
Admission: RE | Admit: 2015-12-20 | Discharge: 2015-12-20 | Disposition: A | Discharge status: Home / Self Care | Payer: BLUE CROSS/BLUE SHIELD | Source: Ambulatory Visit | Attending: Radiation Oncology | Admitting: Radiation Oncology

## 2015-12-20 ENCOUNTER — Encounter: Payer: Self-pay | Admitting: Radiation Oncology

## 2015-12-20 VITALS — BP 136/78 | HR 91 | Temp 98.1°F | Resp 20 | Wt 200.0 lb

## 2015-12-20 DIAGNOSIS — Z510 Encounter for antineoplastic radiation therapy: Secondary | ICD-10-CM | POA: Diagnosis not present

## 2015-12-20 DIAGNOSIS — C20 Malignant neoplasm of rectum: Secondary | ICD-10-CM

## 2015-12-20 NOTE — Progress Notes (Signed)
Weekly rad txs rectum 19/28 completed, pain with bowel movements, using sitz bath, baby wipes, nause sometimes takes compazine prn helps, loose formed stools 3-5 daily, eating low fiber diet, appetite fair, takes Xeloda bid 2:54 PM BP 136/78 mmHg  Pulse 91  Temp(Src) 98.1 F (36.7 C) (Oral)  Resp 20  Wt 200 lb (90.719 kg)  LMP  (LMP Unknown)  Wt Readings from Last 3 Encounters:  12/20/15 200 lb (90.719 kg)  12/13/15 198 lb 12.8 oz (90.175 kg)  12/06/15 197 lb 11.2 oz (89.676 kg)

## 2015-12-21 NOTE — Progress Notes (Signed)
  Radiation Oncology         [phone]) [phone] ________________________________  Name: SUMMERLIN DIMERY MRN: [id number]  Date: 11/15/2015  DOB: 1966/04/04  Optical Surface Tracking Plan:  Since intensity modulated radiotherapy (IMRT) and 3D conformal radiation treatment methods are predicated on accurate and precise positioning for treatment, intrafraction motion monitoring is medically necessary to ensure accurate and safe treatment delivery.  The ability to quantify intrafraction motion without excessive ionizing radiation dose can only be performed with optical surface tracking. Accordingly, surface imaging offers the opportunity to obtain 3D measurements of patient position throughout IMRT and 3D treatments without excessive radiation exposure.  I am ordering optical surface tracking for this patient's upcoming course of radiotherapy. ________________________________  Kyung Rudd, MD 12/21/2015 10:48 AM    Reference:   Particia Jasper, et al. Surface imaging-based analysis of intrafraction motion for breast radiotherapy patients.Journal of [place], n. 6, nov. 2014. ISSN [id number].   Available at: <http://www.jacmp.org/index.php/jacmp/article/view/4957>.

## 2015-12-21 NOTE — Progress Notes (Signed)
  Radiation Oncology         [phone] ________________________________  Name: Destiny [name] MRN: [id number]  Date: 11/15/2015  DOB: 08/18/1965   SIMULATION AND TREATMENT PLANNING NOTE  DIAGNOSIS:     ICD-9-CM ICD-10-CM   1. Malignant neoplasm of rectum (HCC) 154.1 C20      The patient presented for simulation for the patient's upcoming course of radiation for the diagnosis of rectal cancer. The patient was placed in a supine position. A customized vac-lock bag was constructed to aid in patient immobilization on. This complex treatment device will be used on a daily basis during the treatment. In this fashion a CT scan was obtained through the pelvic region and the isocenter was placed near midline within the pelvis. Surface markings were placed.  The patient's imaging was loaded into the radiation treatment planning system. The patient will initially be planned to receive a course of radiation to a dose of 45 Gy. This will be accomplished in 25 fractions at 1.8 gray per fraction. This initial treatment will correspond to a 3-D conformal technique. The target has been contoured in addition to the rectum, bladder and femoral heads. Dose volume histograms of each of these structures have been requested and these will be carefully reviewed as part of the 3-D conformal treatment planning process. To accomplish this initial treatment, 4 customized blocks have been designed for this purpose. Each of these 4 complex treatment devices will be used on a daily basis during the initial course of the treatment. It is anticipated that the patient will then receive a boost for an additional 5.4 Gy. The anticipated total dose therefore will be 50.4 Gy.    Special treatment procedure The patient will receive chemotherapy during the course of radiation treatment. The patient may experience increased or overlapping toxicity due to this combined-modality approach and the patient will be monitored for such  problems. This may include extra lab work as necessary. This therefore constitutes a special treatment procedure.    ________________________________  Jodelle Gross, MD, PhD

## 2015-12-21 NOTE — Progress Notes (Signed)
Department of Radiation Oncology  Phone:  [phone] Fax:        [phone]  Weekly Treatment Note    Name: Destiny [name] Date: 12/21/2015 MRN: [id number] DOB: September 27, 1965   Diagnosis:     ICD-9-CM ICD-10-CM   1. Malignant neoplasm of rectum (HCC) 154.1 C20      Current dose: 34.2 Gy  Current fraction: 19   MEDICATIONS: Current Outpatient Prescriptions  Medication Sig Dispense Refill  . acetaminophen (TYLENOL) 500 MG tablet Take 1,000 mg by mouth every 8 (eight) hours as needed for moderate pain.    . capecitabine (XELODA) 500 MG tablet Take 3 tablets (1,500 mg total) by mouth 2 (two) times daily after a meal. On days of radiation only (Mon-Fri) 168 tablet 0  . ferrous sulfate 325 (65 FE) MG tablet Take 325 mg by mouth daily with breakfast.    . gabapentin (NEURONTIN) 300 MG capsule 1 CAPSULE TODAY, TWICE A DAY FRIDAY, THEN 3 TIMES A DAY FOR 30 DAYS 93 capsule 0  . hydrochlorothiazide (HYDRODIURIL) 12.5 MG tablet Take 1 tablet (12.5 mg total) by mouth daily. 90 tablet 3  . HYDROcodone-acetaminophen (NORCO) 5-325 MG tablet Take 1 tablet by mouth every 6 (six) hours as needed for moderate pain. 30 tablet 0  . LORazepam (ATIVAN) 0.5 MG tablet TAKE  (1)  TABLET TWICE A DAY. 60 tablet 1  . methimazole (TAPAZOLE) 10 MG tablet Take 4 tablets (40 mg total) by mouth 2 (two) times daily. 240 tablet 2  . prochlorperazine (COMPAZINE) 10 MG tablet Take 1 tablet (10 mg total) by mouth every 6 (six) hours as needed for nausea or vomiting. 30 tablet 1  . sertraline (ZOLOFT) 50 MG tablet Take 1 tablet (50 mg total) by mouth daily. 30 tablet 3   No current facility-administered medications for this encounter.     ALLERGIES: Review of patient's allergies indicates no known allergies.   LABORATORY DATA:  Lab Results  Component Value Date   WBC 7.3 11/21/2015   HGB 11.9 11/21/2015   HCT 34.8 11/21/2015   MCV 76.5* 11/21/2015   PLT 324 11/21/2015   Lab Results  Component Value  Date   NA 139 11/21/2015   K 4.3 11/21/2015   CL 101 02/15/2015   CO2 25 11/21/2015   Lab Results  Component Value Date   ALT 12 11/21/2015   AST 11 11/21/2015   ALKPHOS 131 11/21/2015   BILITOT 0.38 11/21/2015     NARRATIVE: TYWANDA LEHNERT was seen today for weekly treatment management. The chart was checked and the patient's films were reviewed.  Weekly rad txs rectum 19/28 completed, pain with bowel movements, using sitz bath, baby wipes, nause sometimes takes compazine prn helps, loose formed stools 3-5 daily, eating low fiber diet, appetite fair, takes Xeloda bid 10:46 AM BP 136/78 mmHg  Pulse 91  Temp(Src) 98.1 F (36.7 C) (Oral)  Resp 20  Wt 200 lb (90.719 kg)  LMP  (LMP Unknown)  Wt Readings from Last 3 Encounters:  12/20/15 200 lb (90.719 kg)  12/13/15 198 lb 12.8 oz (90.175 kg)  12/06/15 197 lb 11.2 oz (89.676 kg)     PHYSICAL EXAMINATION: weight is 200 lb (90.719 kg). Her oral temperature is 98.1 F (36.7 C). Her blood pressure is 136/78 and her pulse is 91. Her respiration is 20.        ASSESSMENT: The patient is doing satisfactorily with treatment.  PLAN: We will continue with the patient's radiation treatment as  planned.

## 2015-12-21 NOTE — Progress Notes (Signed)
  Radiation Oncology         [phone] ________________________________  Name: Destiny [name] MRN: [id number]  Date: 12/20/2015  DOB: September 04, 1965  COMPLEX SIMULATION  NOTE  Diagnosis: rectal cancer  Narrative The patient has initially been planned to receive a course of radiation treatment to a dose of 45 Gy in 25 fractions at 1.8 gray per fraction. The patient will now receive a boost to the high risk target volume for an additional 5.4 Gy. This will be delivered in 3 fractions at 1.8 Gy per fraction and a cone down boost technique will be utilized. To accomplish this, an additional 4 customized blocks have been designed for this purpose. A complex isodose plan is requested to ensure that the high-risk target region receives the appropriate radiation dose and that the nearby normal structures continue to be appropriately spared. The patient's final total dose therefore will be 50.4 Gy.   ________________________________ ------------------------------------------------  Jodelle Gross, MD, PhD

## 2015-12-21 NOTE — Addendum Note (Signed)
Encounter addended by: Kyung Rudd, MD on: 12/21/2015 10:49 AM<BR>     Documentation filed: Notes Section

## 2015-12-23 ENCOUNTER — Ambulatory Visit
Admission: RE | Admit: 2015-12-23 | Discharge: 2015-12-23 | Disposition: A | Discharge status: Home / Self Care | Payer: BLUE CROSS/BLUE SHIELD | Source: Ambulatory Visit | Attending: Radiation Oncology | Admitting: Radiation Oncology

## 2015-12-23 DIAGNOSIS — Z510 Encounter for antineoplastic radiation therapy: Secondary | ICD-10-CM | POA: Diagnosis not present

## 2015-12-24 ENCOUNTER — Ambulatory Visit
Admission: RE | Admit: 2015-12-24 | Discharge: 2015-12-24 | Disposition: A | Discharge status: Home / Self Care | Payer: BLUE CROSS/BLUE SHIELD | Source: Ambulatory Visit | Attending: Radiation Oncology | Admitting: Radiation Oncology

## 2015-12-24 ENCOUNTER — Ambulatory Visit (HOSPITAL_BASED_OUTPATIENT_CLINIC_OR_DEPARTMENT_OTHER): Payer: BLUE CROSS/BLUE SHIELD

## 2015-12-24 ENCOUNTER — Other Ambulatory Visit: Payer: Self-pay | Admitting: *Deleted

## 2015-12-24 ENCOUNTER — Telehealth: Payer: Self-pay | Admitting: Oncology

## 2015-12-24 DIAGNOSIS — Z510 Encounter for antineoplastic radiation therapy: Secondary | ICD-10-CM | POA: Diagnosis not present

## 2015-12-24 DIAGNOSIS — C20 Malignant neoplasm of rectum: Secondary | ICD-10-CM

## 2015-12-24 LAB — COMPREHENSIVE METABOLIC PANEL
ALT: 10 U/L (ref 0–55)
AST: 12 U/L (ref 5–34)
Albumin: 3.7 g/dL (ref 3.5–5.0)
Alkaline Phosphatase: 133 U/L (ref 40–150)
Anion Gap: 9 mEq/L (ref 3–11)
BILIRUBIN TOTAL: 0.65 mg/dL (ref 0.20–1.20)
BUN: 12.5 mg/dL (ref 7.0–26.0)
CHLORIDE: 102 meq/L (ref 98–109)
CO2: 27 meq/L (ref 22–29)
Calcium: 8.9 mg/dL (ref 8.4–10.4)
Creatinine: 0.8 mg/dL (ref 0.6–1.1)
EGFR: 81 mL/min/{1.73_m2} — AB (ref >90)
GLUCOSE: 120 mg/dL (ref 70–140)
POTASSIUM: 4.3 meq/L (ref 3.5–5.1)
SODIUM: 137 meq/L (ref 136–145)
TOTAL PROTEIN: 7.3 g/dL (ref 6.4–8.3)

## 2015-12-24 LAB — CBC WITH DIFFERENTIAL/PLATELET
BASO%: 0.2 % (ref 0.0–2.0)
Basophils Absolute: 0 10*3/uL (ref 0.0–0.1)
EOS ABS: 0 10*3/uL (ref 0.0–0.5)
EOS%: 0.4 % (ref 0.0–7.0)
HCT: 33.6 % — ABNORMAL LOW (ref 34.8–46.6)
HGB: 11.8 g/dL (ref 11.6–15.9)
LYMPH%: 11.9 % — AB (ref 14.0–49.7)
MCH: 28.1 pg (ref 25.1–34.0)
MCHC: 35.1 g/dL (ref 31.5–36.0)
MCV: 80 fL (ref 79.5–101.0)
MONO#: 0.5 10*3/uL (ref 0.1–0.9)
MONO%: 9.1 % (ref 0.0–14.0)
NEUT%: 78.4 % — ABNORMAL HIGH (ref 38.4–76.8)
NEUTROS ABS: 4.2 10*3/uL (ref 1.5–6.5)
Platelets: 196 10*3/uL (ref 145–400)
RBC: 4.2 10*6/uL (ref 3.70–5.45)
RDW: 17.2 % — ABNORMAL HIGH (ref 11.2–14.5)
WBC: 5.4 10*3/uL (ref 3.9–10.3)
lymph#: 0.6 10*3/uL — ABNORMAL LOW (ref 0.9–3.3)

## 2015-12-24 NOTE — Telephone Encounter (Signed)
Appointment for 01/10/16 confirmed with patient. Patient also requested that labs from 12/17/15 appointment that she missed to be rescheduled for today 12/24/15. Labs was scheduled for 12/24/15 as add on. Merleen Nicely.

## 2015-12-25 ENCOUNTER — Telehealth: Payer: Self-pay | Admitting: Nurse Practitioner

## 2015-12-25 ENCOUNTER — Ambulatory Visit
Admission: RE | Admit: 2015-12-25 | Discharge: 2015-12-25 | Disposition: A | Discharge status: Home / Self Care | Payer: BLUE CROSS/BLUE SHIELD | Source: Ambulatory Visit | Attending: Radiation Oncology | Admitting: Radiation Oncology

## 2015-12-25 ENCOUNTER — Other Ambulatory Visit: Payer: Self-pay | Admitting: *Deleted

## 2015-12-25 ENCOUNTER — Ambulatory Visit (HOSPITAL_BASED_OUTPATIENT_CLINIC_OR_DEPARTMENT_OTHER): Payer: BLUE CROSS/BLUE SHIELD | Admitting: Nurse Practitioner

## 2015-12-25 VITALS — BP 142/76 | HR 75 | Temp 98.1°F | Resp 18 | Ht 61.5 in | Wt 199.3 lb

## 2015-12-25 DIAGNOSIS — C20 Malignant neoplasm of rectum: Secondary | ICD-10-CM | POA: Diagnosis not present

## 2015-12-25 DIAGNOSIS — I10 Essential (primary) hypertension: Secondary | ICD-10-CM | POA: Diagnosis not present

## 2015-12-25 DIAGNOSIS — Z510 Encounter for antineoplastic radiation therapy: Secondary | ICD-10-CM | POA: Diagnosis not present

## 2015-12-25 NOTE — Telephone Encounter (Signed)
spoke w/ pt confirmed 8/8 apt times °

## 2015-12-25 NOTE — Progress Notes (Signed)
  [facility] OFFICE PROGRESS NOTE   Diagnosis:  Rectal cancer  INTERVAL HISTORY:   Destiny [name] returns for follow-up. She began radiation and Xeloda on 11/25/2015. She has occasional mild nausea; one vomiting episode. She has a persistent mouth sore at the left lower inner lip which predated Xeloda. She is using Biotene mouth rinse. She has periodic loose stools. At time she sees a small amount of blood following a bowel movement. No pain at the rectum. No hand or foot pain or redness.  Objective:  Vital signs in last 24 hours:  Blood pressure (!) 142/76, pulse 75, temperature 98.1 F (36.7 C), temperature source Oral, resp. rate 18, height 5' 1.5" (1.562 m), weight 199 lb 4.8 oz (90.4 kg), SpO2 99 %.    HEENT: No thrush or ulcers. Fleshy appearing lesion left lower inner lip near the gum. Resp: Lungs clear bilaterally. Cardio: Regular rate and rhythm. GI: Abdomen soft and nontender. No hepatomegaly. Vascular: No leg edema. Skin: Palms with mild erythema.    Lab Results:  Lab Results  Component Value Date   WBC 5.4 12/24/2015   HGB 11.8 12/24/2015   HCT 33.6 (L) 12/24/2015   MCV 80.0 12/24/2015   PLT 196 12/24/2015   NEUTROABS 4.2 12/24/2015    Imaging:  No results found.  Medications: I have reviewed the patient's current medications.  Assessment/Plan: 1. Rectal cancer-clinical stage III (T3 N1)  No loss of mismatch repair protein expression  T3 N1 tumor beginning at 2 cm from the anal verge on an MRI of the pelvis 11/08/2015  CTs of the chest, abdomen, and pelvis 11/01/2015 with no evidence of distant metastatic disease  Initiation of neoadjuvant radiation/Xeloda 11/25/2015  2. Hypertension    Disposition: Destiny [name] appears stable. She continues neoadjuvant radiation/Xeloda. She is scheduled to complete the course of radiation on 01/02/2016. She understands to discontinue Xeloda coinciding with completion of radiation.  She will return for a  follow-up visit on 01/07/2016. She will contact the office in the interim with any problems.    Ned Card ANP/GNP-BC   12/25/2015  3:50 PM

## 2015-12-26 ENCOUNTER — Ambulatory Visit
Admission: RE | Admit: 2015-12-26 | Discharge: 2015-12-26 | Disposition: A | Discharge status: Home / Self Care | Payer: BLUE CROSS/BLUE SHIELD | Source: Ambulatory Visit | Attending: Radiation Oncology | Admitting: Radiation Oncology

## 2015-12-26 DIAGNOSIS — Z510 Encounter for antineoplastic radiation therapy: Secondary | ICD-10-CM | POA: Diagnosis not present

## 2015-12-27 ENCOUNTER — Encounter: Payer: Self-pay | Admitting: *Deleted

## 2015-12-27 ENCOUNTER — Encounter: Payer: Self-pay | Admitting: Radiation Oncology

## 2015-12-27 ENCOUNTER — Ambulatory Visit
Admission: RE | Admit: 2015-12-27 | Discharge: 2015-12-27 | Disposition: A | Discharge status: Home / Self Care | Payer: BLUE CROSS/BLUE SHIELD | Source: Ambulatory Visit | Attending: Radiation Oncology | Admitting: Radiation Oncology

## 2015-12-27 VITALS — BP 127/71 | HR 91 | Temp 99.0°F | Ht 61.5 in | Wt 199.5 lb

## 2015-12-27 DIAGNOSIS — C20 Malignant neoplasm of rectum: Secondary | ICD-10-CM

## 2015-12-27 DIAGNOSIS — Z510 Encounter for antineoplastic radiation therapy: Secondary | ICD-10-CM | POA: Diagnosis not present

## 2015-12-27 NOTE — Progress Notes (Signed)
.   Department of Radiation Oncology  Phone:  [phone] Fax:        [phone]  Weekly Treatment Note    Name: Destiny [name] Date: 12/27/2015 MRN: [id number] DOB: 01-16-1966   Diagnosis:     ICD-9-CM ICD-10-CM   1. Malignant neoplasm of rectum (HCC) 154.1 C20      Current dose: 43.2 Gy  Current fraction: 24   MEDICATIONS: Current Outpatient Prescriptions  Medication Sig Dispense Refill  . acetaminophen (TYLENOL) 500 MG tablet Take 1,000 mg by mouth every 8 (eight) hours as needed for moderate pain.    . capecitabine (XELODA) 500 MG tablet Take 3 tablets (1,500 mg total) by mouth 2 (two) times daily after a meal. On days of radiation only (Mon-Fri) 168 tablet 0  . ferrous sulfate 325 (65 FE) MG tablet Take 325 mg by mouth daily with breakfast.    . gabapentin (NEURONTIN) 300 MG capsule 1 CAPSULE TODAY, TWICE A DAY FRIDAY, THEN 3 TIMES A DAY FOR 30 DAYS 93 capsule 0  . hydrochlorothiazide (HYDRODIURIL) 12.5 MG tablet Take 1 tablet (12.5 mg total) by mouth daily. 90 tablet 3  . HYDROcodone-acetaminophen (NORCO) 5-325 MG tablet Take 1 tablet by mouth every 6 (six) hours as needed for moderate pain. 30 tablet 0  . LORazepam (ATIVAN) 0.5 MG tablet TAKE  (1)  TABLET TWICE A DAY. 60 tablet 1  . methimazole (TAPAZOLE) 10 MG tablet Take 4 tablets (40 mg total) by mouth 2 (two) times daily. 240 tablet 2  . prochlorperazine (COMPAZINE) 10 MG tablet Take 1 tablet (10 mg total) by mouth every 6 (six) hours as needed for nausea or vomiting. 30 tablet 1  . sertraline (ZOLOFT) 50 MG tablet Take 1 tablet (50 mg total) by mouth daily. 30 tablet 3   No current facility-administered medications for this encounter.      ALLERGIES: Review of patient's allergies indicates no known allergies.   LABORATORY DATA:  Lab Results  Component Value Date   WBC 5.4 12/24/2015   HGB 11.8 12/24/2015   HCT 33.6 (L) 12/24/2015   MCV 80.0 12/24/2015   PLT 196 12/24/2015   Lab Results  Component  Value Date   NA 137 12/24/2015   K 4.3 12/24/2015   CL 101 02/15/2015   CO2 27 12/24/2015   Lab Results  Component Value Date   ALT 10 12/24/2015   AST 12 12/24/2015   ALKPHOS 133 12/24/2015   BILITOT 0.65 12/24/2015     NARRATIVE: AERABELLA [name] was seen today for weekly treatment management. The chart was checked and the patient's films were reviewed.  Destiny [name] has completed 24 fractions to her rectum.  She reports having lower back pain and takes norco as needed at night.  She reports having an ulcer on the inside of her lower lip and has been given an antibiotic by her dentist.  She reports having loose stools 4-5 times a day.  She had diarrhea on Tuesday and took 3 Imodium.  She reports having skin irritation and is using a sitz bath and baby wipes.  She is wondering what cream she should use.  Recommended using A & D ointment.  She reports having occasional rectal bleeding.  She is taking Xeloda with occasional nausea.  Her temp was slightly elevated today at 99 degrees.  6:20 PM BP 127/71 (BP Location: Right Arm, Patient Position: Sitting)   Pulse 91   Temp 99 F (37.2 C) (Oral)   Ht 5' 1.5" (  1.562 m)   Wt 199 lb 8 oz (90.5 kg)   LMP  (LMP Unknown) Comment: perimenopausal  SpO2 98%   BMI 37.08 kg/m   Wt Readings from Last 3 Encounters:  12/27/15 199 lb 8 oz (90.5 kg)  12/25/15 199 lb 4.8 oz (90.4 kg)  12/20/15 200 lb (90.7 kg)     PHYSICAL EXAMINATION: height is 5' 1.5" (1.562 m) and weight is 199 lb 8 oz (90.5 kg). Her oral temperature is 99 F (37.2 C). Her blood pressure is 127/71 and her pulse is 91. Her oxygen saturation is 98%.        ASSESSMENT: The patient is doing satisfactorily with treatment.  PLAN: We will continue with the patient's radiation treatment as planned. The patient finishes treatment next week and will follow up with Dr. Lisbeth Renshaw in one month.   ------------------------------------------------   Tyler Pita, MD [facility] Director and Director of Stereotactic Radiosurgery Direct Dial: [phone]  Fax: [phone] [email or web address]  Skype  [email or web address]    This document serves as a record of services personally performed by Tyler Pita, MD. It was created on his behalf by Truddie Hidden, a trained medical scribe. The creation of this record is based on the scribe's personal observations and the provider's statements to them. This document has been checked and approved by the attending provider.

## 2015-12-27 NOTE — Progress Notes (Signed)
Azlee Boesen has completed 24 fractions to her rectum.  She reports having lower back pain and takes norco as needed at night.  She reports having an ulcer on the inside of her lower lip and has been given an antibiotic by her dentist.  She reports having loose stools 4-5 times a day.  She had diarrhea on Tuesday and took 3 Imodium.  She reports having skin irritation and is using a sitz bath and baby wipes.  She is wondering what cream she should use.  Recommended using A & D ointment.  She reports having occasional rectal bleeding.  She is taking Xeloda with occasional nausea.  Her temp was slightly elevated today at 99 degrees.  BP 127/71 (BP Location: Right Arm, Patient Position: Sitting)   Pulse 91   Temp 99 F (37.2 C) (Oral)   Ht 5' 1.5" (1.562 m)   Wt 199 lb 8 oz (90.5 kg)   LMP  (LMP Unknown) Comment: perimenopausal  SpO2 98%   BMI 37.08 kg/m    Wt Readings from Last 3 Encounters:  12/27/15 199 lb 8 oz (90.5 kg)  12/25/15 199 lb 4.8 oz (90.4 kg)  12/20/15 200 lb (90.7 kg)

## 2015-12-27 NOTE — Progress Notes (Signed)
Oncology Nurse Navigator Documentation  Oncology Nurse Navigator Flowsheets 12/27/2015  Navigator Location [facility]  Navigator Encounter Type Treatment  Telephone -  Abnormal Finding Date -  Confirmed Diagnosis Date -  Treatment Initiated Date 11/25/2015  Patient Visit Type RadOnc  Treatment Phase Active Tx--RT/Xeloda  Barriers/Navigation Needs Education  Education Pain/ Symptom Management--diarrhea on occasion/bowel movement is painful/some irritation at rectum  Interventions -  Referrals -  Coordination of Care -  Education Method -  Support Groups/Services -  Acuity -  Time Spent with Patient 15  Met with her in rad onc today. She has had occasional diarrhea and it can be unpredictable-encouraged her to always have Imodium in hand even when she is out and possibly an extra pair of underwear. She will discuss her skin care with physician today when she is seen and will request a topical for her burning. Overall she thinks she is doing well-still works 3 hours/day and babysits her grandchildren frequently. Feels very tired today-encouraged her to rest when she gets home.

## 2015-12-30 ENCOUNTER — Ambulatory Visit
Admission: RE | Admit: 2015-12-30 | Discharge: 2015-12-30 | Disposition: A | Discharge status: Home / Self Care | Payer: BLUE CROSS/BLUE SHIELD | Source: Ambulatory Visit | Attending: Radiation Oncology | Admitting: Radiation Oncology

## 2015-12-30 DIAGNOSIS — C20 Malignant neoplasm of rectum: Secondary | ICD-10-CM

## 2015-12-30 DIAGNOSIS — Z510 Encounter for antineoplastic radiation therapy: Secondary | ICD-10-CM | POA: Diagnosis not present

## 2015-12-30 NOTE — Progress Notes (Signed)
Patient reports having burning with urination that became painful on Saturday.  She has been using a sitz bath and A&D ointment.  The skin on her rectal area is pink and intact.  Patient advised to try AZO over the counter per Dr. Lisbeth Renshaw to see if that helps.  She was sent to lab for urinalysis and culture.

## 2015-12-31 ENCOUNTER — Ambulatory Visit
Admission: RE | Admit: 2015-12-31 | Discharge: 2015-12-31 | Disposition: A | Discharge status: Home / Self Care | Payer: BLUE CROSS/BLUE SHIELD | Source: Ambulatory Visit | Attending: Radiation Oncology | Admitting: Radiation Oncology

## 2015-12-31 DIAGNOSIS — Z510 Encounter for antineoplastic radiation therapy: Secondary | ICD-10-CM | POA: Diagnosis not present

## 2015-12-31 LAB — URINALYSIS, MICROSCOPIC - CHCC
BILIRUBIN (URINE): NEGATIVE
Glucose: NEGATIVE mg/dL
Ketones: NEGATIVE mg/dL
NITRITE: NEGATIVE
Protein: NEGATIVE mg/dL
SPECIFIC GRAVITY, URINE: 1.015 (ref 1.003–1.035)
UROBILINOGEN UR: 0.2 mg/dL (ref 0.2–1)
pH: 6.5 (ref 4.6–8.0)

## 2016-01-01 ENCOUNTER — Ambulatory Visit
Admission: RE | Admit: 2016-01-01 | Discharge: 2016-01-01 | Disposition: A | Discharge status: Home / Self Care | Payer: BLUE CROSS/BLUE SHIELD | Source: Ambulatory Visit | Attending: Radiation Oncology | Admitting: Radiation Oncology

## 2016-01-01 ENCOUNTER — Other Ambulatory Visit: Payer: Self-pay | Admitting: General Surgery

## 2016-01-01 ENCOUNTER — Encounter: Payer: Self-pay | Admitting: Radiation Oncology

## 2016-01-01 VITALS — BP 131/64 | HR 89 | Temp 98.8°F | Resp 20

## 2016-01-01 DIAGNOSIS — C20 Malignant neoplasm of rectum: Secondary | ICD-10-CM

## 2016-01-01 DIAGNOSIS — Z510 Encounter for antineoplastic radiation therapy: Secondary | ICD-10-CM | POA: Diagnosis not present

## 2016-01-01 LAB — URINE CULTURE: ORGANISM ID, BACTERIA: NO GROWTH

## 2016-01-01 NOTE — Progress Notes (Addendum)
Weekly rad txs rectum 27/28 completed, patient has had diarrhea today and right when she got here for radiation and threw up, , had bacon/egg sandwich for breakfast, watermelon for lunch, water and soda , juice, threw up the watermelon stated and felt better after words; says she took imodium today, has felt better than the other days, no skin breakdown, no hematuria 1 month f/u appt  02/04/16 with Shona Simpson,  BP 131/64 (BP Location: Right Leg, Patient Position: Sitting, Cuff Size: Normal)   Pulse 89   Temp 98.8 F (37.1 C) (Oral)   Resp 20   LMP  (LMP Unknown) Comment: perimenopausal  Wt Readings from Last 3 Encounters:  12/27/15 199 lb 8 oz (90.5 kg)  12/25/15 199 lb 4.8 oz (90.4 kg)  12/20/15 200 lb (90.7 kg)

## 2016-01-02 ENCOUNTER — Ambulatory Visit
Admission: RE | Admit: 2016-01-02 | Discharge: 2016-01-02 | Disposition: A | Discharge status: Home / Self Care | Payer: BLUE CROSS/BLUE SHIELD | Source: Ambulatory Visit | Attending: Radiation Oncology | Admitting: Radiation Oncology

## 2016-01-02 ENCOUNTER — Encounter: Payer: Self-pay | Admitting: Radiation Oncology

## 2016-01-02 ENCOUNTER — Telehealth: Payer: Self-pay | Admitting: *Deleted

## 2016-01-02 ENCOUNTER — Ambulatory Visit: Payer: BLUE CROSS/BLUE SHIELD

## 2016-01-02 DIAGNOSIS — Z510 Encounter for antineoplastic radiation therapy: Secondary | ICD-10-CM | POA: Diagnosis not present

## 2016-01-02 NOTE — Telephone Encounter (Signed)
Ok, if counts are ok

## 2016-01-02 NOTE — Telephone Encounter (Signed)
Message from Dr. Adin Hector office requesting clearance from Dr. Benay Spice for tooth extraction. Pt needs 5 teeth extracted. Pt will complete radiation/ Xeloda today. Next lab/visit 01/07/16. Message to Dr. Benay Spice.

## 2016-01-02 NOTE — Progress Notes (Signed)
Department of Radiation Oncology  Phone:  [phone] Fax:        [phone]  Weekly Treatment Note    Name: Destiny [name] Date: 01/02/2016 MRN: [id number] DOB: Nov 21, 1965   Diagnosis:     ICD-9-CM ICD-10-CM   1. Malignant neoplasm of rectum (HCC) 154.1 C20      Current dose: 48.6 Gy  Current fraction: 27   MEDICATIONS: Current Outpatient Prescriptions  Medication Sig Dispense Refill  . acetaminophen (TYLENOL) 500 MG tablet Take 1,000 mg by mouth every 8 (eight) hours as needed for moderate pain.    . capecitabine (XELODA) 500 MG tablet Take 3 tablets (1,500 mg total) by mouth 2 (two) times daily after a meal. On days of radiation only (Mon-Fri) 168 tablet 0  . ferrous sulfate 325 (65 FE) MG tablet Take 325 mg by mouth daily with breakfast.    . gabapentin (NEURONTIN) 300 MG capsule 1 CAPSULE TODAY, TWICE A DAY FRIDAY, THEN 3 TIMES A DAY FOR 30 DAYS 93 capsule 0  . hydrochlorothiazide (HYDRODIURIL) 12.5 MG tablet Take 1 tablet (12.5 mg total) by mouth daily. 90 tablet 3  . HYDROcodone-acetaminophen (NORCO) 5-325 MG tablet Take 1 tablet by mouth every 6 (six) hours as needed for moderate pain. 30 tablet 0  . LORazepam (ATIVAN) 0.5 MG tablet TAKE  (1)  TABLET TWICE A DAY. 60 tablet 1  . methimazole (TAPAZOLE) 10 MG tablet Take 4 tablets (40 mg total) by mouth 2 (two) times daily. 240 tablet 2  . prochlorperazine (COMPAZINE) 10 MG tablet Take 1 tablet (10 mg total) by mouth every 6 (six) hours as needed for nausea or vomiting. 30 tablet 1  . sertraline (ZOLOFT) 50 MG tablet Take 1 tablet (50 mg total) by mouth daily. 30 tablet 3   No current facility-administered medications for this encounter.      ALLERGIES: Review of patient's allergies indicates no known allergies.   LABORATORY DATA:  Lab Results  Component Value Date   WBC 5.4 12/24/2015   HGB 11.8 12/24/2015   HCT 33.6 (L) 12/24/2015   MCV 80.0 12/24/2015   PLT 196 12/24/2015   Lab Results  Component Value  Date   NA 137 12/24/2015   K 4.3 12/24/2015   CL 101 02/15/2015   CO2 27 12/24/2015   Lab Results  Component Value Date   ALT 10 12/24/2015   AST 12 12/24/2015   ALKPHOS 133 12/24/2015   BILITOT 0.65 12/24/2015     NARRATIVE: Destiny [name] was seen today for weekly treatment management. The chart was checked and the patient's films were reviewed.  Weekly rad txs rectum 27/28 completed, patient has had diarrhea today and right when she got here for radiation and threw up, , had bacon/egg sandwich for breakfast, watermelon for lunch, water and soda , juice, threw up the watermelon stated and felt better after words; says she took imodium today, has felt better than the other days, no skin breakdown, no hematuria 1 month f/u appt  02/04/16 with Shona Simpson,  BP 131/64 (BP Location: Right Leg, Patient Position: Sitting, Cuff Size: Normal)   Pulse 89   Temp 98.8 F (37.1 C) (Oral)   Resp 20   LMP  (LMP Unknown) Comment: perimenopausal  Wt Readings from Last 3 Encounters:  12/27/15 199 lb 8 oz (90.5 kg)  12/25/15 199 lb 4.8 oz (90.4 kg)  12/20/15 200 lb (90.7 kg)    PHYSICAL EXAMINATION: oral temperature is 98.8 F (37.1 C). Her  blood pressure is 131/64 and her pulse is 89. Her respiration is 20.        ASSESSMENT: The patient is doing satisfactorily with treatment.  PLAN: We will continue with the patient's radiation treatment as planned. The patient will continue with Imodium as needed. She does have nausea medication as well to take as needed. She will follow-up in one month after her final fraction of radiation treatment tomorrow.

## 2016-01-03 ENCOUNTER — Ambulatory Visit: Payer: BLUE CROSS/BLUE SHIELD

## 2016-01-03 NOTE — Telephone Encounter (Signed)
Labs routed to Dr. Joelyn Oms. Called office informed them it was OK for tooth extraction.

## 2016-01-06 ENCOUNTER — Ambulatory Visit: Payer: BLUE CROSS/BLUE SHIELD | Admitting: Oncology

## 2016-01-06 ENCOUNTER — Other Ambulatory Visit: Payer: BLUE CROSS/BLUE SHIELD

## 2016-01-07 ENCOUNTER — Ambulatory Visit (HOSPITAL_BASED_OUTPATIENT_CLINIC_OR_DEPARTMENT_OTHER): Payer: BLUE CROSS/BLUE SHIELD | Admitting: Nurse Practitioner

## 2016-01-07 ENCOUNTER — Other Ambulatory Visit (HOSPITAL_BASED_OUTPATIENT_CLINIC_OR_DEPARTMENT_OTHER): Payer: BLUE CROSS/BLUE SHIELD

## 2016-01-07 ENCOUNTER — Telehealth: Payer: Self-pay

## 2016-01-07 ENCOUNTER — Telehealth: Payer: Self-pay | Admitting: Oncology

## 2016-01-07 VITALS — BP 122/70 | HR 77 | Temp 98.2°F | Resp 18 | Ht 61.5 in | Wt 200.8 lb

## 2016-01-07 DIAGNOSIS — I10 Essential (primary) hypertension: Secondary | ICD-10-CM | POA: Diagnosis not present

## 2016-01-07 DIAGNOSIS — C20 Malignant neoplasm of rectum: Secondary | ICD-10-CM

## 2016-01-07 DIAGNOSIS — R300 Dysuria: Secondary | ICD-10-CM | POA: Diagnosis not present

## 2016-01-07 DIAGNOSIS — N390 Urinary tract infection, site not specified: Secondary | ICD-10-CM

## 2016-01-07 LAB — URINALYSIS, MICROSCOPIC - CHCC
BILIRUBIN (URINE): NEGATIVE
GLUCOSE UR CHCC: NEGATIVE mg/dL
Ketones: NEGATIVE mg/dL
NITRITE: NEGATIVE
PH: 6 (ref 4.6–8.0)
Specific Gravity, Urine: 1.02 (ref 1.003–1.035)
Urobilinogen, UR: 0.2 mg/dL (ref 0.2–1)

## 2016-01-07 LAB — CBC WITH DIFFERENTIAL/PLATELET
BASO%: 0.1 % (ref 0.0–2.0)
BASOS ABS: 0 10*3/uL (ref 0.0–0.1)
EOS%: 0.2 % (ref 0.0–7.0)
Eosinophils Absolute: 0 10*3/uL (ref 0.0–0.5)
HCT: 33.2 % — ABNORMAL LOW (ref 34.8–46.6)
HEMOGLOBIN: 11.4 g/dL — AB (ref 11.6–15.9)
LYMPH%: 7 % — ABNORMAL LOW (ref 14.0–49.7)
MCH: 28.8 pg (ref 25.1–34.0)
MCHC: 34.3 g/dL (ref 31.5–36.0)
MCV: 84.1 fL (ref 79.5–101.0)
MONO#: 0.5 10*3/uL (ref 0.1–0.9)
MONO%: 10.4 % (ref 0.0–14.0)
NEUT%: 82.3 % — ABNORMAL HIGH (ref 38.4–76.8)
NEUTROS ABS: 4.3 10*3/uL (ref 1.5–6.5)
Platelets: 264 10*3/uL (ref 145–400)
RBC: 3.95 10*6/uL (ref 3.70–5.45)
RDW: 23.1 % — AB (ref 11.2–14.5)
WBC: 5.2 10*3/uL (ref 3.9–10.3)
lymph#: 0.4 10*3/uL — ABNORMAL LOW (ref 0.9–3.3)

## 2016-01-07 LAB — COMPREHENSIVE METABOLIC PANEL
ALBUMIN: 3.6 g/dL (ref 3.5–5.0)
ALK PHOS: 132 U/L (ref 40–150)
ALT: 12 U/L (ref 0–55)
ANION GAP: 7 meq/L (ref 3–11)
AST: 12 U/L (ref 5–34)
BUN: 11.3 mg/dL (ref 7.0–26.0)
CALCIUM: 8.8 mg/dL (ref 8.4–10.4)
CHLORIDE: 106 meq/L (ref 98–109)
CO2: 27 mEq/L (ref 22–29)
Creatinine: 0.8 mg/dL (ref 0.6–1.1)
EGFR: 81 mL/min/{1.73_m2} — AB (ref >90)
Glucose: 101 mg/dl (ref 70–140)
POTASSIUM: 4.1 meq/L (ref 3.5–5.1)
Sodium: 140 mEq/L (ref 136–145)
Total Bilirubin: 0.55 mg/dL (ref 0.20–1.20)
Total Protein: 7.1 g/dL (ref 6.4–8.3)

## 2016-01-07 MED ORDER — SULFAMETHOXAZOLE-TRIMETHOPRIM 800-160 MG PO TABS: 1.0000 | ORAL_TABLET | Freq: Two times a day (BID) | ORAL | 0 refills | Status: DC

## 2016-01-07 NOTE — Telephone Encounter (Signed)
Gave pt call & avs °

## 2016-01-07 NOTE — Telephone Encounter (Signed)
-----   Message from Owens Shark, NP sent at 01/07/2016  4:05 PM EDT ----- Please let her know the urinalysis is consistent with a urinary tract infection. I sent a prescription to her pharmacy for Septra. Please ask lab to add a urine culture.

## 2016-01-07 NOTE — Telephone Encounter (Signed)
Called patient and informed her of u/a results, and antibiotic rx sent to her pharmacy. Pt verbalized understanding and denies any questions or concerns at this time.   Called lab to add on urine culture, orders placed.

## 2016-01-07 NOTE — Progress Notes (Addendum)
  Destiny [name]   Diagnosis:  Rectal cancer  INTERVAL HISTORY:   Destiny [name] returns as scheduled. She completed neoadjuvant radiation/Xeloda 01/02/2016. She has mild intermittent nausea. No vomiting. Periodic loose stools. Some pain with bowel movements. No bleeding with bowel movements. She has had dysuria for the past week. No hand or foot pain or redness. She is seeing a dentist regarding the left lower gumline/inner lip ulceration. She reports the dentist told her it was related to her dentures.  Objective:  Vital signs in last 24 hours:  Blood pressure 122/70, pulse 77, temperature 98.2 F (36.8 C), temperature source Oral, resp. rate 18, height 5' 1.5" (1.562 m), weight 200 lb 12.8 oz (91.1 kg), SpO2 100 %.    HEENT: Area of ulceration left lower gumline/inner lip. Resp: Lungs clear bilaterally. Cardio: Regular rate and rhythm. GI: Abdomen soft and nontender. No hepatomegaly. Vascular: No leg edema. Skin: Palms without erythema.    Lab Results:  Lab Results  Component Value Date   WBC 5.2 01/07/2016   HGB 11.4 (L) 01/07/2016   HCT 33.2 (L) 01/07/2016   MCV 84.1 01/07/2016   PLT 264 01/07/2016   NEUTROABS 4.3 01/07/2016    Imaging:  No results found.  Medications: I have reviewed the patient's current medications.  Assessment/Plan: 1. Rectal cancer-clinical stage III (T3 N1)  No loss of mismatch repair protein expression  T3 N1 tumor beginning at 2 cm from the anal verge on an MRI of the pelvis 11/08/2015  CTs of the chest, abdomen, and pelvis 11/01/2015 with no evidence of distant metastatic disease  Initiation of neoadjuvant radiation/Xeloda 11/25/2015; completion of radiation/Xeloda 01/02/2016  2. Hypertension   Disposition: Destiny [name] appears stable. She has completed the course of neoadjuvant radiation/Xeloda. She has a follow-up appointment with Dr. Leighton Ruff later this month. She reports she is scheduled for  surgery 02/26/2016. We will see her about 2 weeks after surgery to review the pathology report and discuss adjuvant therapy.  We will obtain a urinalysis today to evaluate the dysuria.  She will follow-up with her dentist regarding the mouth ulcer.  Patient seen with Dr. Benay Spice.  Ned Card ANP/GNP-BC   01/07/2016  2:53 PM  This was a shared visit with Ned Card. Destiny [name] was interviewed and examined. She has completed neoadjuvant chemotherapy/radiation. We will see her following surgery to discuss the indication for adjuvant systemic therapy.  Julieanne Manson, M.D.

## 2016-01-10 ENCOUNTER — Ambulatory Visit: Payer: BLUE CROSS/BLUE SHIELD | Admitting: Oncology

## 2016-01-10 ENCOUNTER — Other Ambulatory Visit: Payer: BLUE CROSS/BLUE SHIELD

## 2016-01-13 NOTE — Progress Notes (Signed)
°  Radiation Oncology         [phone] ________________________________  Name: Destiny [name] MRN: [id number]  Date: 01/02/2016  DOB: 1965-10-15  End of Treatment Note  Diagnosis: Stage IIIB, T3, N1, M0 adenocarcinoma of the rectum  Indication for treatment: Curative  Radiation treatment dates: 11/26/15 - 01/02/16  Site/dose: 1) Rectum: 45 Gy in 25 fractions.   2) Rectal Boost: 5.4 Gy in 3 fractions.  Beams/energy: 1) 3D // 15X, 6X Photon   2) Isodose Plan // 15X, 6X Photon  Narrative: The patient tolerated radiation treatment relatively well. The patient's main complaints were nausea, diarrhea, and rectal irritation.  Plan: The patient has completed radiation treatment. The patient will return to radiation oncology clinic for routine followup in one month. I advised them to call or return sooner if they have any questions or concerns related to their recovery or treatment.  ------------------------------------------------  Jodelle Gross, MD, PhD  This document serves as a record of services personally performed by Kyung Rudd, MD. It was created on his behalf by Darcus Austin, a trained medical scribe. The creation of this record is based on the scribe's personal observations and the provider's statements to them. This document has been checked and approved by the attending provider.

## 2016-01-21 ENCOUNTER — Other Ambulatory Visit: Payer: Self-pay | Admitting: General Surgery

## 2016-01-24 ENCOUNTER — Encounter: Payer: Self-pay | Admitting: Family Medicine

## 2016-01-24 ENCOUNTER — Ambulatory Visit (INDEPENDENT_AMBULATORY_CARE_PROVIDER_SITE_OTHER): Payer: BLUE CROSS/BLUE SHIELD | Admitting: Family Medicine

## 2016-01-24 VITALS — BP 122/71 | HR 76 | Temp 98.1°F | Ht 61.5 in | Wt 203.6 lb

## 2016-01-24 DIAGNOSIS — I10 Essential (primary) hypertension: Secondary | ICD-10-CM | POA: Diagnosis not present

## 2016-01-24 DIAGNOSIS — E669 Obesity, unspecified: Secondary | ICD-10-CM

## 2016-01-24 DIAGNOSIS — L259 Unspecified contact dermatitis, unspecified cause: Secondary | ICD-10-CM

## 2016-01-24 DIAGNOSIS — M545 Low back pain, unspecified: Secondary | ICD-10-CM

## 2016-01-24 MED ORDER — DICLOFENAC SODIUM 75 MG PO TBEC: 75.0000 mg | DELAYED_RELEASE_TABLET | Freq: Two times a day (BID) | ORAL | 2 refills | Status: DC

## 2016-01-24 MED ORDER — CYCLOBENZAPRINE HCL 10 MG PO TABS
10.0000 mg | ORAL_TABLET | Freq: Three times a day (TID) | ORAL | 1 refills | Status: DC | PRN
Start: 2016-01-24 — End: 2016-02-06

## 2016-01-24 MED ORDER — BETAMETHASONE SOD PHOS & ACET 6 (3-3) MG/ML IJ SUSP
6.0000 mg | Freq: Once | INTRAMUSCULAR | Status: AC
Start: 2016-01-24 — End: 2016-01-24
  Administered 2016-01-24: 6 mg via INTRAMUSCULAR

## 2016-01-24 NOTE — Progress Notes (Signed)
Subjective:  Patient ID: Destiny [name], female    DOB: July 27, 1965  Age: 50 y.o. MRN: [id number]  CC: Back Pain (LBP x 1 mth) and Rash (feet and hands x 1 wk)   HPI Destiny [name] presents for Increasing pain in the lower back. Onset 1 month ago with increasing over the last several days. This is exacerbated when she squats. She points to the midline at the small for back approximately L5. She runs her hand bilaterally along that L5 level. She denies any radiation into the buttocks or legs. She does have some numbness in the feet that she noted last night. Pain is moderately severe at 6-7/10. It is a dull ache.  Patient also has some eruption on the feet and legs bilaterally. This is mildly pruritic. It has been present for about a week. Lesions are scattered over the calves and arms. She denies any exposure to poison ivy. No known exposure history. Onset one week ago gradually increasing since.  Patient is currently undergoing treatment for rectal carcinoma. History Joanny has a past medical history of Cancer ([place]) (10/29/15); Depression; Hypertension; and Rectal cancer ([place]).   She has a past surgical history that includes Eye surgery; Bladder surgery; Vaginal hysterectomy; and Abdominal hysterectomy.   Her family history includes Cirrhosis in her brother; Lung cancer in her father; Thyroid disease in her mother.She reports that she has never smoked. She has never used smokeless tobacco. She reports that she drinks alcohol. She reports that she does not use drugs.    ROS Review of Systems  Constitutional: Positive for activity change and fatigue. Negative for appetite change and fever.  HENT: Negative.   Respiratory: Negative for shortness of breath.   Cardiovascular: Negative for chest pain.  Musculoskeletal: Positive for arthralgias, back pain and myalgias. Negative for neck pain.  Skin: Positive for rash. Negative for color change and wound.  Neurological: Positive for weakness and  numbness (both feet last night).  Psychiatric/Behavioral: Positive for dysphoric mood. Negative for confusion.    Objective:  BP 122/71 (BP Location: Left Arm, Patient Position: Sitting, Cuff Size: Large)   Pulse 76   Temp 98.1 F (36.7 C) (Oral)   Ht 5' 1.5" (1.562 m)   Wt 203 lb 9.6 oz (92.4 kg)   LMP  (LMP Unknown) Comment: perimenopausal  SpO2 99%   BMI 37.85 kg/m   BP Readings from Last 3 Encounters:  01/24/16 122/71  01/07/16 122/70  01/01/16 131/64    Wt Readings from Last 3 Encounters:  01/24/16 203 lb 9.6 oz (92.4 kg)  01/07/16 200 lb 12.8 oz (91.1 kg)  12/27/15 199 lb 8 oz (90.5 kg)     Physical Exam  Constitutional: She is oriented to person, place, and time. She appears well-developed and well-nourished. No distress.  HENT:  Head: Normocephalic and atraumatic.  Eyes: Conjunctivae and EOM are normal. Pupils are equal, round, and reactive to light.  Neck: Normal range of motion. Neck supple.  Cardiovascular: Normal rate, regular rhythm and normal heart sounds.   No murmur heard. Pulmonary/Chest: Effort normal and breath sounds normal. No respiratory distress. She has no wheezes. She has no rales.  Abdominal: Soft. Bowel sounds are normal. She exhibits no distension. There is no tenderness.  Musculoskeletal: She exhibits tenderness. She exhibits no edema.       Lumbar back: She exhibits decreased range of motion, tenderness and spasm. She exhibits no deformity and normal pulse.  Neurological: She is alert and oriented to person,  place, and time. She has normal reflexes.  Skin: Skin is warm and dry. Rash (cattered 1/2-1 cm papules over her calves and shins and forearms. These are slightly erythematous with a thin scale. No vesicular or pustular lesions noted.) noted.  Psychiatric: She has a normal mood and affect. Her behavior is normal. Thought content normal.     Lab Results  Component Value Date   WBC 5.2 01/07/2016   HGB 11.4 (L) 01/07/2016   HCT 33.2  (L) 01/07/2016   PLT 264 01/07/2016   GLUCOSE 101 01/07/2016   CHOL 111 02/15/2015   TRIG 95 02/15/2015   HDL 43 02/15/2015   LDLCALC 49 02/15/2015   ALT 12 01/07/2016   AST 12 01/07/2016   NA 140 01/07/2016   K 4.1 01/07/2016   CL 101 02/15/2015   CREATININE 0.8 01/07/2016   BUN 11.3 01/07/2016   CO2 27 01/07/2016   TSH 0.06 (L) 11/12/2015   HGBA1C 5.5 05/02/2015    No results found.  Assessment & Plan:   Leta was seen today for back pain and rash.  Diagnoses and all orders for this visit:  Bilateral low back pain without sciatica -     betamethasone acetate-betamethasone sodium phosphate (CELESTONE) injection 6 mg; Inject 1 mL (6 mg total) into the muscle once.  Essential hypertension, benign  Obesity  Contact dermatitis -     betamethasone acetate-betamethasone sodium phosphate (CELESTONE) injection 6 mg; Inject 1 mL (6 mg total) into the muscle once.  Other orders -     cyclobenzaprine (FLEXERIL) 10 MG tablet; Take 1 tablet (10 mg total) by mouth 3 (three) times daily as needed for muscle spasms. -     diclofenac (VOLTAREN) 75 MG EC tablet; Take 1 tablet (75 mg total) by mouth 2 (two) times daily. For muscle and  Joint pain    Home exercise program prescribed and handout given.  I have discontinued Ms. Beckius capecitabine and sulfamethoxazole-trimethoprim. I am also having her start on cyclobenzaprine and diclofenac. Additionally, I am having her maintain her acetaminophen, hydrochlorothiazide, sertraline, LORazepam, methimazole, ferrous sulfate, gabapentin, prochlorperazine, and HYDROcodone-acetaminophen. We will continue to administer betamethasone acetate-betamethasone sodium phosphate.  Meds ordered this encounter  Medications  . betamethasone acetate-betamethasone sodium phosphate (CELESTONE) injection 6 mg  . cyclobenzaprine (FLEXERIL) 10 MG tablet    Sig: Take 1 tablet (10 mg total) by mouth 3 (three) times daily as needed for muscle spasms.    Dispense:   90 tablet    Refill:  1  . diclofenac (VOLTAREN) 75 MG EC tablet    Sig: Take 1 tablet (75 mg total) by mouth 2 (two) times daily. For muscle and  Joint pain    Dispense:  60 tablet    Refill:  2  I believe that the feet being numb is likely related to her cancer treatment since it is bilateral and there are no other radicular symptoms suspicious for this coming from the back.   Follow-up: Return in about 2 weeks (around 02/07/2016), or if symptoms worsen or fail to improve, for Back pain.  Claretta Fraise, M.D.

## 2016-01-24 NOTE — Patient Instructions (Signed)

## 2016-01-31 ENCOUNTER — Encounter: Payer: Self-pay | Admitting: Endocrinology

## 2016-01-31 ENCOUNTER — Ambulatory Visit (INDEPENDENT_AMBULATORY_CARE_PROVIDER_SITE_OTHER): Payer: BLUE CROSS/BLUE SHIELD | Admitting: Endocrinology

## 2016-01-31 VITALS — BP 144/96 | HR 74 | Wt 208.0 lb

## 2016-01-31 DIAGNOSIS — E0590 Thyrotoxicosis, unspecified without thyrotoxic crisis or storm: Secondary | ICD-10-CM | POA: Diagnosis not present

## 2016-01-31 LAB — T4, FREE: FREE T4: 0.33 ng/dL — AB (ref 0.60–1.60)

## 2016-01-31 LAB — TSH: TSH: 4.3 u[IU]/mL (ref 0.35–4.50)

## 2016-01-31 NOTE — Progress Notes (Signed)
Subjective:    Patient ID: Destiny [name], female    DOB: 1966-05-31, 50 y.o.   MRN: [id number]  HPI Pt returns for f/u of hyperthyroidism (dx'ed 2016; probably due to [place] dz; she has never had dedicated thyroid imaging; tapazole was chosen as initial rx, due to severity, but she chooses to continue this as long-term rx).  She has gained weight.  She says she seldom misses the tapazole.  She takes 4x10 mg, BID (rather then the rx'ed QD).   She now wants to pursue RAI.    Past Medical History:  Diagnosis Date  . Cancer ([place]) 10/29/15   rectal invasice adenocarcinoma   . Depression   . Hypertension   . Rectal cancer ([place])    Adenocarcinoma dx 10/29/15; Stage III, T3, N1, MO    Past Surgical History:  Procedure Laterality Date  . ABDOMINAL HYSTERECTOMY     vaginal   . BLADDER SURGERY    . EYE SURGERY    . VAGINAL HYSTERECTOMY      Social History   Social History  . Marital status: Divorced    Spouse name: N/A  . Number of children: 3  . Years of education: N/A   Occupational History  . Not on file.   Social History Main Topics  . Smoking status: Never Smoker  . Smokeless tobacco: Never Used  . Alcohol use 0.0 oz/week     Comment: occ  . Drug use: No  . Sexual activity: Yes    Birth control/ protection: Surgical   Other Topics Concern  . Not on file   Social History Narrative  . No narrative on file    Current Outpatient Prescriptions on File Prior to Visit  Medication Sig Dispense Refill  . acetaminophen (TYLENOL) 500 MG tablet Take 1,000 mg by mouth every 8 (eight) hours as needed for moderate pain.    . cyclobenzaprine (FLEXERIL) 10 MG tablet Take 1 tablet (10 mg total) by mouth 3 (three) times daily as needed for muscle spasms. 90 tablet 1  . diclofenac (VOLTAREN) 75 MG EC tablet Take 1 tablet (75 mg total) by mouth 2 (two) times daily. For muscle and  Joint pain 60 tablet 2  . ferrous sulfate 325 (65 FE) MG tablet Take 325 mg by mouth daily with breakfast.     . gabapentin (NEURONTIN) 300 MG capsule 1 CAPSULE TODAY, TWICE A DAY FRIDAY, THEN 3 TIMES A DAY FOR 30 DAYS 93 capsule 0  . hydrochlorothiazide (HYDRODIURIL) 12.5 MG tablet Take 1 tablet (12.5 mg total) by mouth daily. 90 tablet 3  . HYDROcodone-acetaminophen (NORCO) 5-325 MG tablet Take 1 tablet by mouth every 6 (six) hours as needed for moderate pain. 30 tablet 0  . LORazepam (ATIVAN) 0.5 MG tablet TAKE  (1)  TABLET TWICE A DAY. 60 tablet 1  . prochlorperazine (COMPAZINE) 10 MG tablet Take 1 tablet (10 mg total) by mouth every 6 (six) hours as needed for nausea or vomiting. 30 tablet 1  . sertraline (ZOLOFT) 50 MG tablet Take 1 tablet (50 mg total) by mouth daily. 30 tablet 3   No current facility-administered medications on file prior to visit.     No Known Allergies  Family History  Problem Relation Age of Onset  . Thyroid disease Mother   . Lung cancer Father     lung  . Cirrhosis Brother     BP (!) 144/96   Pulse 74   Wt 208 lb (94.3 kg)  LMP  (LMP Unknown) Comment: perimenopausal  SpO2 97%   BMI 38.66 kg/m    Review of Systems She denies fever.    Objective:   Physical Exam VITAL SIGNS:  See vs page GENERAL: no distress head: no deformity  eyes: no periorbital swelling, no proptosis  NECK: thyroid is approx 3 times normal size, diffuse.  Skin: not diaphoretic.  Neuro: no tremor.  Ext: no edema   Lab Results  Component Value Date   TSH 4.30 01/31/2016   T4TOTAL 32.3 (HH) 02/18/2015      Assessment & Plan:  Hyperthyroidism: slightly overcontrolled. D/c tapazole, recheck TFT in 2 weeks, to prepare for RAI.

## 2016-01-31 NOTE — Patient Instructions (Addendum)
Thyroid blood tests are requested for you today.  We'll let you know about the results. Please stop taking the methimazole.  When the thyroid is not low, I'll prescribe the radioactive iodine for you.  It works like this: We would first check a thyroid "scan" (a special, but easy and painless type of thyroid x ray).  you go to the x-ray department of the hospital to swallow a pill, which contains a miniscule amount of radiation.  You will not notice any symptoms from this.  You will go back to the x-ray department the next day, to lie down in front of a camera.  The results of this will be sent to me.   Based on the results, i hope to order for you a treatment pill of radioactive iodine.  Although it is a larger amount of radiation, you will again notice no symptoms from this.  The pill is gone from your body in a few days (during which you should stay away from other people), but takes several months to work.  Therefore, please return here approximately 6-8 weeks after the treatment.  This treatment has been available for many years, and the only known side-effect is an underactive thyroid.  It is possible that i would eventually prescribe for you a thyroid hormone pill, which is very inexpensive.  You don't have to worry about side-effects of this thyroid hormone pill, because it is the same molecule your thyroid makes.

## 2016-02-03 ENCOUNTER — Emergency Department (HOSPITAL_COMMUNITY)
Admission: EM | Admit: 2016-02-03 | Discharge: 2016-02-03 | Disposition: A | Discharge status: Home / Self Care | Payer: BLUE CROSS/BLUE SHIELD | Attending: Emergency Medicine | Admitting: Emergency Medicine

## 2016-02-03 ENCOUNTER — Emergency Department (HOSPITAL_COMMUNITY): Payer: BLUE CROSS/BLUE SHIELD

## 2016-02-03 ENCOUNTER — Encounter (HOSPITAL_COMMUNITY): Payer: Self-pay | Admitting: Emergency Medicine

## 2016-02-03 DIAGNOSIS — N390 Urinary tract infection, site not specified: Secondary | ICD-10-CM

## 2016-02-03 DIAGNOSIS — M545 Low back pain, unspecified: Secondary | ICD-10-CM

## 2016-02-03 DIAGNOSIS — Z85048 Personal history of other malignant neoplasm of rectum, rectosigmoid junction, and anus: Secondary | ICD-10-CM | POA: Diagnosis not present

## 2016-02-03 DIAGNOSIS — Z79899 Other long term (current) drug therapy: Secondary | ICD-10-CM | POA: Insufficient documentation

## 2016-02-03 DIAGNOSIS — I10 Essential (primary) hypertension: Secondary | ICD-10-CM | POA: Insufficient documentation

## 2016-02-03 HISTORY — DX: Other chronic pain: G89.29

## 2016-02-03 HISTORY — DX: Dorsalgia, unspecified: M54.9

## 2016-02-03 LAB — URINALYSIS, ROUTINE W REFLEX MICROSCOPIC
Bilirubin Urine: NEGATIVE
GLUCOSE, UA: NEGATIVE mg/dL
Ketones, ur: NEGATIVE mg/dL
Nitrite: NEGATIVE
PH: 5.5 (ref 5.0–8.0)
PROTEIN: NEGATIVE mg/dL
Specific Gravity, Urine: 1.02 (ref 1.005–1.030)

## 2016-02-03 LAB — URINE MICROSCOPIC-ADD ON

## 2016-02-03 MED ORDER — METHOCARBAMOL 500 MG PO TABS: 1000.0000 mg | ORAL_TABLET | Freq: Four times a day (QID) | ORAL | 0 refills | Status: DC | PRN

## 2016-02-03 MED ORDER — HYDROCODONE-ACETAMINOPHEN 5-325 MG PO TABS
2.0000 | ORAL_TABLET | Freq: Once | ORAL | Status: AC
  Administered 2016-02-03: 2 via ORAL
  Filled 2016-02-03: qty 2

## 2016-02-03 MED ORDER — CEPHALEXIN 500 MG PO CAPS: 500.0000 mg | ORAL_CAPSULE | Freq: Four times a day (QID) | ORAL | 0 refills | Status: DC

## 2016-02-03 NOTE — ED Notes (Signed)
Discharge instructions reviewed with patient including to stop taking Flexeril since she is being sent home with a prescription for Robaxin. [place].

## 2016-02-03 NOTE — Discharge Instructions (Signed)
Stop taking your flexeril (cyclobenzaprine = muscle relaxant). Take the prescriptions as directed.  Apply moist heat or ice to the area(s) of discomfort, for 15 minutes at a time, several times per day for the next few days.  Do not fall asleep on a heating or ice pack.  Call your regular medical doctor tomorrow to schedule a follow up appointment in the next 2 to 3 days.  Return to the Emergency Department immediately if worsening.

## 2016-02-03 NOTE — ED Provider Notes (Signed)
[facility] DEPT Provider Note   CSN: [id number] Arrival date & time: 02/03/16  1613     History   Chief Complaint Chief Complaint  Patient presents with  . Back Pain    HPI Destiny [name] is a 50 y.o. female.  HPI  Pt was seen at 1755. Per pt, c/o gradual onset and persistence of constant acute flair of her chronic low back "pain" for the past several weeks.  Denies any change in her usual chronic pain pattern.  Pain worsens with palpation of the area and body position changes. Pt was evaluated by her PMD this past week for her symptoms, "got a shot of steroids," and rx pain meds and muscle relaxant. Pt states she came to the ED today because she "still hurts." Denies incont/retention of bowel or bladder, no saddle anesthesia, no focal motor weakness, no tingling/numbness in extremities, no fevers, no injury, no abd pain.   The symptoms have been associated with no other complaints. The patient has a significant history of similar symptoms previously, recently being evaluated for this complaint and multiple prior evals for same.    Past Medical History:  Diagnosis Date  . Cancer ([place]) 10/29/15   rectal invasice adenocarcinoma   . Chronic back pain   . Depression   . Hypertension   . Rectal cancer ([place])    Adenocarcinoma dx 10/29/15; Stage III, T3, N1, MO    Patient Active Problem List   Diagnosis Date Noted  . Malignant neoplasm of rectum ([place]) 11/17/2015  . Anemia 10/01/2015  . Essential hypertension, benign 09/02/2015  . Obesity 09/02/2015  . Depression 05/02/2015  . Hyperthyroidism 02/27/2015  . Closed fracture of nasal bone 02/18/2015    Past Surgical History:  Procedure Laterality Date  . ABDOMINAL HYSTERECTOMY     vaginal   . BLADDER SURGERY    . EYE SURGERY    . VAGINAL HYSTERECTOMY      OB History    No data available       Home Medications    Prior to Admission medications   Medication Sig Start Date End Date Taking? Authorizing Provider    acetaminophen (TYLENOL) 500 MG tablet Take 1,000 mg by mouth every 8 (eight) hours as needed for moderate pain.   Yes Historical Provider, MD  cyclobenzaprine (FLEXERIL) 10 MG tablet Take 1 tablet (10 mg total) by mouth 3 (three) times daily as needed for muscle spasms. 01/24/16  Yes Claretta Fraise, MD  diclofenac (VOLTAREN) 75 MG EC tablet Take 1 tablet (75 mg total) by mouth 2 (two) times daily. For muscle and  Joint pain 01/24/16  Yes Claretta Fraise, MD  ferrous sulfate 325 (65 FE) MG tablet Take 325 mg by mouth daily with breakfast.   Yes Historical Provider, MD  gabapentin (NEURONTIN) 300 MG capsule 1 CAPSULE TODAY, TWICE A DAY FRIDAY, THEN 3 TIMES A DAY FOR 30 DAYS Patient taking differently: 1 CAPSULE  3 TIMES A DAY 11/26/15  Yes Fransisca Kaufmann Dettinger, MD  hydrochlorothiazide (HYDRODIURIL) 12.5 MG tablet Take 1 tablet (12.5 mg total) by mouth daily. 05/02/15  Yes Fransisca Kaufmann Dettinger, MD  HYDROcodone-acetaminophen (NORCO) 5-325 MG tablet Take 1 tablet by mouth every 6 (six) hours as needed for moderate pain. 12/16/15  Yes Hayden Pedro, PA-C  LORazepam (ATIVAN) 0.5 MG tablet TAKE  (1)  TABLET TWICE A DAY. Patient taking differently: TAKE  (1)  TABLET TWICE A DAY AS NEEDED FOR ANXIETY 08/26/15  Yes Worthy Rancher, MD  sertraline (ZOLOFT) 50 MG tablet Take 1 tablet (50 mg total) by mouth daily. 06/07/15  Yes Fransisca Kaufmann Dettinger, MD    Family History Family History  Problem Relation Age of Onset  . Thyroid disease Mother   . Lung cancer Father     lung  . Cirrhosis Brother     Social History Social History  Substance Use Topics  . Smoking status: Never Smoker  . Smokeless tobacco: Never Used  . Alcohol use 0.0 oz/week     Comment: occ     Allergies   Review of patient's allergies indicates no known allergies.   Review of Systems Review of Systems ROS: Statement: All systems negative except as marked or noted in the HPI; Constitutional: Negative for fever and chills. ; ; Eyes:  Negative for eye pain, redness and discharge. ; ; ENMT: Negative for ear pain, hoarseness, nasal congestion, sinus pressure and sore throat. ; ; Cardiovascular: Negative for chest pain, palpitations, diaphoresis, dyspnea and peripheral edema. ; ; Respiratory: Negative for cough, wheezing and stridor. ; ; Gastrointestinal: Negative for nausea, vomiting, diarrhea, abdominal pain, blood in stool, hematemesis, jaundice and rectal bleeding. . ; ; Genitourinary: Negative for dysuria, flank pain and hematuria. ; ; Musculoskeletal: +LBP. Negative for neck pain. Negative for swelling and trauma.; ; Skin: Negative for pruritus, rash, abrasions, blisters, bruising and skin lesion.; ; Neuro: Negative for headache, lightheadedness and neck stiffness. Negative for weakness, altered level of consciousness, altered mental status, extremity weakness, paresthesias, involuntary movement, seizure and syncope.       Physical Exam Updated Vital Signs BP 157/81 (BP Location: Left Arm)   Pulse 68   Temp 98 F (36.7 C) (Oral)   Resp 18   LMP  (LMP Unknown) Comment: perimenopausal  SpO2 100%   Physical Exam 1800: Physical examination:  Nursing notes reviewed; Vital signs and O2 SAT reviewed;  Constitutional: Well developed, Well nourished, Well hydrated, In no acute distress; Head:  Normocephalic, atraumatic; Eyes: EOMI, PERRL, No scleral icterus; ENMT: Mouth and pharynx normal, Mucous membranes moist; Neck: Supple, Full range of motion, No lymphadenopathy; Cardiovascular: Regular rate and rhythm, No gallop; Respiratory: Breath sounds clear & equal bilaterally, No wheezes.  Speaking full sentences with ease, Normal respiratory effort/excursion; Chest: Nontender, Movement normal; Abdomen: Soft, Nontender, Nondistended, Normal bowel sounds; Genitourinary: No CVA tenderness; Spine:  No midline CS, TS, LS tenderness. +TTP right lumbar paraspinal muscles.;; Extremities: Pulses normal, No tenderness, No edema, No calf edema or  asymmetry.; Neuro: AA&Ox3, Major CN grossly intact.  Speech clear. No gross focal motor or sensory deficits in extremities. Strength 5/5 equal bilat UE's and LE's, including great toe dorsiflexion.  DTR 2/4 equal bilat UE's and LE's.  No gross sensory deficits.  Neg straight leg raises bilat.; Skin: Color normal, Warm, Dry.   ED Treatments / Results  Labs (all labs ordered are listed, but only abnormal results are displayed)   EKG  EKG Interpretation None       Radiology   Procedures Procedures (including critical care time)  Medications Ordered in ED Medications - No data to display   Initial Impression / Assessment and Plan / ED Course  I have reviewed the triage vital signs and the nursing notes.  Pertinent labs & imaging results that were available during my care of the patient were reviewed by me and considered in my medical decision making (see chart for details).  MDM Reviewed: previous chart, nursing note and vitals Reviewed previous: CT scan and MRI Interpretation:  labs and CT scan   Results for orders placed or performed during the hospital encounter of 02/03/16  Urinalysis, Routine w reflex microscopic  Result Value Ref Range   Color, Urine YELLOW YELLOW   APPearance CLEAR CLEAR   Specific Gravity, Urine 1.020 1.005 - 1.030   pH 5.5 5.0 - 8.0   Glucose, UA NEGATIVE NEGATIVE mg/dL   Hgb urine dipstick TRACE (A) NEGATIVE   Bilirubin Urine NEGATIVE NEGATIVE   Ketones, ur NEGATIVE NEGATIVE mg/dL   Protein, ur NEGATIVE NEGATIVE mg/dL   Nitrite NEGATIVE NEGATIVE   Leukocytes, UA SMALL (A) NEGATIVE  Urine microscopic-add on  Result Value Ref Range   Squamous Epithelial / LPF 0-5 (A) NONE SEEN   WBC, UA 6-30 0 - 5 WBC/hpf   RBC / HPF 0-5 0 - 5 RBC/hpf   Bacteria, UA FEW (A) NONE SEEN   Ct Renal Stone Study Result Date: 02/03/2016 CLINICAL DATA:  Reports chronic back pain with no injury greater than one week. Given steroids, pain pills, and muscle relaxant  with no relief. Also c/o rash but none visible. Rectal adenocarcinoma. EXAM: CT ABDOMEN AND PELVIS WITHOUT CONTRAST TECHNIQUE: Multidetector CT imaging of the abdomen and pelvis was performed following the standard protocol without IV contrast. COMPARISON:  11/01/2015 FINDINGS: Lower chest: No pulmonary nodules, pleural effusions, or infiltrates. Heart size is normal. No imaged pericardial effusion or significant coronary artery calcifications. Hepatobiliary: Normal noncontrast appearance of the liver. Gallbladder is decompressed. Pancreas: Normal appearance of the pancreas. Spleen: Normal appearance. Renal/Adrenal: The adrenal glands are normal in appearance. Kidneys have a normal noncontrast appearance. Gastrointestinal tract: Stomach and small bowel loops are normal in appearance. Colonic diverticula are noted. No acute diverticulitis. Anorectal wall thickening noted on the prior study is not well appreciated on the current study which is performed without the use of intravenous or oral contrast. Reproductive/Pelvis: Status post hysterectomy. No adnexal mass. No free pelvic fluid. Pelvic floor laxity again noted. Vascular/Lymphatic: Small presacral lymph node is 7 mm. No significant retroperitoneal adenopathy. No evidence for aortic aneurysm. Musculoskeletal/Abdominal wall: Visualized osseous structures have a normal appearance. Other: none IMPRESSION: 1. No acute abnormality of the abdomen or pelvis. 2. No acute osseous abnormality. 3. Colonic diverticulosis. 4. Anorectal wall appears normal on this noncontrast exam. 5. Pelvic floor laxity. Electronically Signed   By: Nolon Nations M.D.   On: 02/03/2016 19:07    2050:  Will tx for UTI. CT scan reassuring. Will change muscle relaxant to robaxin. Pt aware she will not receive another narcotic prescription. Dx and testing d/w pt.  Questions answered.  Verb understanding, agreeable to d/c home with outpt f/u.       Final Clinical Impressions(s) / ED  Diagnoses   Final diagnoses:  Right low back pain    New Prescriptions New Prescriptions   No medications on file     Francine Graven, DO 02/05/16 1811

## 2016-02-03 NOTE — ED Triage Notes (Signed)
Reports chronic back pain with no injury greater than one week.  Given steroids, pain pills, and muscle relaxers with no relief.  Also c/o rash but none visible.

## 2016-02-03 NOTE — ED Notes (Signed)
Pt states she is unable to provide urine, patient given more water to drink. Pt informed that if she is still unable to urinate an in and out cath may be ordered. Pt states if so she is okay with that. Will let pt drink water and reassess

## 2016-02-04 ENCOUNTER — Ambulatory Visit
Admission: RE | Admit: 2016-02-04 | Discharge: 2016-02-04 | Disposition: A | Discharge status: Home / Self Care | Payer: BLUE CROSS/BLUE SHIELD | Source: Ambulatory Visit | Attending: Radiation Oncology | Admitting: Radiation Oncology

## 2016-02-04 DIAGNOSIS — C20 Malignant neoplasm of rectum: Secondary | ICD-10-CM

## 2016-02-04 NOTE — Progress Notes (Signed)
This note was made in pre-charting in anticipation of the patient's appointment which she no-showed for.   Radiation Oncology         [phone] ________________________________  Name: Destiny [name] MRN: [id number]  Date: 02/04/2016  DOB: December 22, 1965  Post Treatment Note  CC: Fransisca Kaufmann Dettinger, MD  Armbruster, Renelda Loma*  Diagnosis:   Stage IIIB, T3, N1,M0 adenocarcinoma of the rectum  Interval Since Last Radiation: 4 weeks   11/26/15 - 01/02/16: 1. Rectum: 45 Gy in 25 fractions. 2. Rectal Boost: 5.4 Gy in 3 fractions.  Narrative:  The patient returns today for routine follow-up. She did well with treatment, but 2/3 the way through, did complain of worsening back pain. She has had this complaint prior to treatment. She was seen yesterday in the ED and no evidence of rectal cancer was seen on the non contrast scan she had, and she was discharged home with antibiotics for an identified UTI and a muscle relaxer.

## 2016-02-05 LAB — URINE CULTURE

## 2016-02-06 ENCOUNTER — Encounter: Payer: Self-pay | Admitting: Family Medicine

## 2016-02-06 ENCOUNTER — Ambulatory Visit (INDEPENDENT_AMBULATORY_CARE_PROVIDER_SITE_OTHER): Payer: BLUE CROSS/BLUE SHIELD | Admitting: Family Medicine

## 2016-02-06 VITALS — BP 130/86 | HR 93 | Temp 97.4°F | Ht 61.5 in | Wt 204.8 lb

## 2016-02-06 DIAGNOSIS — Z01419 Encounter for gynecological examination (general) (routine) without abnormal findings: Secondary | ICD-10-CM

## 2016-02-06 DIAGNOSIS — M545 Low back pain, unspecified: Secondary | ICD-10-CM

## 2016-02-06 MED ORDER — METHOCARBAMOL 500 MG PO TABS: 1000.0000 mg | ORAL_TABLET | Freq: Four times a day (QID) | ORAL | 2 refills | Status: DC | PRN

## 2016-02-06 NOTE — Progress Notes (Signed)
BP 130/86   Pulse 93   Temp 97.4 F (36.3 C) (Oral)   Ht 5' 1.5" (1.562 m)   Wt 204 lb 12.8 oz (92.9 kg)   LMP  (LMP Unknown) Comment: perimenopausal  BMI 38.07 kg/m    Subjective:    Patient ID: Destiny [name], female    DOB: 1965-08-21, 50 y.o.   MRN: [id number]  HPI: Destiny [name] is a 50 y.o. female presenting on 02/06/2016 for Gynecologic Exam   HPI Well woman exam with routine gynecological exam Patient is coming in today for well woman exam and routine gynecological exam. She denies any issues with vaginal bleeding or discharge. She has had a hysterectomy previously. She denies any issues with breast lumps or nodules or discharge or skin changes. She is due for mammogram and has been many many years since she's had a pelvic exam as well. She does say she is having some urinary stress incontinence when she coughs or sneezes but it is not frequent enough that she wants to do something about it right now. She has recently been diagnosed with stage III colon cancer and has been seen by her oncologist is been doing both radiation and chemotherapy and is planned for surgery in a month or 2. She denies any chest pain, shortness of breath, headaches or vision issues, abdominal complaints, diarrhea, nausea, vomiting, or joint issues.   Right lower back pain Since starting radiation and chemotherapy she has been having increased right lower back pain. She was seen here for once and was given full tear in and Flexeril and stretches and she didn't really feel like they helped. She is also given a steroid shot at one point did not feel like that helped either. She is also been given Robaxin and hydrocodone and does not feel like the hydrocodone helped but didn't feel like the Robaxin helped some. She would like to try that Robaxin again to see if it can give her continued help.  Relevant past medical, surgical, family and social history reviewed and updated as indicated. Interim medical history since  our last visit reviewed. Allergies and medications reviewed and updated.  Review of Systems  Constitutional: Negative for chills and fever.  HENT: Negative for congestion, ear discharge and ear pain.   Eyes: Negative for redness and visual disturbance.  Respiratory: Negative for chest tightness and shortness of breath.   Cardiovascular: Negative for chest pain and leg swelling.  Genitourinary: Negative for difficulty urinating, dysuria, menstrual problem, vaginal bleeding, vaginal discharge and vaginal pain.  Musculoskeletal: Negative for back pain and gait problem.  Skin: Negative for color change and rash.  Neurological: Negative for dizziness, light-headedness and headaches.  Psychiatric/Behavioral: Negative for agitation and behavioral problems.  All other systems reviewed and are negative.   Per HPI unless specifically indicated above     Medication List       Accurate as of 02/06/16  2:44 PM. Always use your most recent med list.          acetaminophen 500 MG tablet Commonly known as:  TYLENOL Take 1,000 mg by mouth every 8 (eight) hours as needed for moderate pain.   cephALEXin 500 MG capsule Commonly known as:  KEFLEX Take 1 capsule (500 mg total) by mouth 4 (four) times daily.   diclofenac 75 MG EC tablet Commonly known as:  VOLTAREN Take 1 tablet (75 mg total) by mouth 2 (two) times daily. For muscle and  Joint pain   ferrous sulfate  325 (65 FE) MG tablet Take 325 mg by mouth daily with breakfast.   gabapentin 300 MG capsule Commonly known as:  NEURONTIN 1 CAPSULE TODAY, TWICE A DAY FRIDAY, THEN 3 TIMES A DAY FOR 30 DAYS   hydrochlorothiazide 12.5 MG tablet Commonly known as:  HYDRODIURIL Take 1 tablet (12.5 mg total) by mouth daily.   HYDROcodone-acetaminophen 5-325 MG tablet Commonly known as:  NORCO Take 1 tablet by mouth every 6 (six) hours as needed for moderate pain.   LORazepam 0.5 MG tablet Commonly known as:  ATIVAN TAKE  (1)  TABLET TWICE A  DAY.   methocarbamol 500 MG tablet Commonly known as:  ROBAXIN Take 2 tablets (1,000 mg total) by mouth 4 (four) times daily as needed for muscle spasms (muscle spasm/pain).   sertraline 50 MG tablet Commonly known as:  ZOLOFT Take 1 tablet (50 mg total) by mouth daily.          Objective:    BP 130/86   Pulse 93   Temp 97.4 F (36.3 C) (Oral)   Ht 5' 1.5" (1.562 m)   Wt 204 lb 12.8 oz (92.9 kg)   LMP  (LMP Unknown) Comment: perimenopausal  BMI 38.07 kg/m   Wt Readings from Last 3 Encounters:  02/06/16 204 lb 12.8 oz (92.9 kg)  01/31/16 208 lb (94.3 kg)  01/24/16 203 lb 9.6 oz (92.4 kg)    Physical Exam  Constitutional: She is oriented to person, place, and time. She appears well-developed and well-nourished. No distress.  Eyes: Conjunctivae and EOM are normal. Pupils are equal, round, and reactive to light.  Neck: Neck supple. No thyromegaly present.  Cardiovascular: Normal rate, regular rhythm, normal heart sounds and intact distal pulses.   No murmur heard. Pulmonary/Chest: Effort normal and breath sounds normal. No respiratory distress. She has no wheezes. Right breast exhibits no inverted nipple, no mass, no nipple discharge, no skin change and no tenderness. Left breast exhibits no inverted nipple, no mass, no nipple discharge, no skin change and no tenderness. Breasts are symmetrical.  Abdominal: Soft. Bowel sounds are normal. She exhibits no distension. There is no tenderness. There is no rebound and no guarding.  Genitourinary: Vagina normal. No breast swelling, tenderness, discharge or bleeding. Pelvic exam was performed with patient supine. There is no rash or lesion on the right labia. There is no rash or lesion on the left labia.  Genitourinary Comments: Uterus and cervix absent. Vaginal cuff appears normal. Patient does have some vaginal prolapse but does not come to opening  Musculoskeletal: Normal range of motion. She exhibits no edema or tenderness.    Lymphadenopathy:    She has no cervical adenopathy.    She has no axillary adenopathy.  Neurological: She is alert and oriented to person, place, and time. Coordination normal.  Skin: Skin is warm and dry. No rash noted. She is not diaphoretic.  Psychiatric: She has a normal mood and affect. Her behavior is normal.  Nursing note and vitals reviewed.     Assessment & Plan:   Problem List Items Addressed This Visit    None    Visit Diagnoses    Well woman exam with routine gynecological exam    -  Primary   Relevant Orders   Pap IG, rfx HPV all pth   MM Digital Screening   Right-sided low back pain without sciatica       Relevant Medications   methocarbamol (ROBAXIN) 500 MG tablet  Follow up plan: Return in about 3 months (around 05/07/2016), or if symptoms worsen or fail to improve, for Follow-up back pain and hypertension.  Counseling provided for all of the vaccine components Orders Placed This Encounter  Procedures  . MM Digital Screening    Caryl Pina, MD [facility] 02/06/2016, 2:44 PM

## 2016-02-10 ENCOUNTER — Encounter (HOSPITAL_COMMUNITY): Payer: Self-pay

## 2016-02-10 ENCOUNTER — Other Ambulatory Visit: Payer: Self-pay

## 2016-02-10 ENCOUNTER — Encounter (HOSPITAL_COMMUNITY)
Admission: RE | Admit: 2016-02-10 | Discharge: 2016-02-10 | Disposition: A | Discharge status: Home / Self Care | Payer: BLUE CROSS/BLUE SHIELD | Source: Ambulatory Visit | Attending: General Surgery | Admitting: General Surgery

## 2016-02-10 DIAGNOSIS — Z01812 Encounter for preprocedural laboratory examination: Secondary | ICD-10-CM | POA: Insufficient documentation

## 2016-02-10 DIAGNOSIS — Z01810 Encounter for preprocedural cardiovascular examination: Secondary | ICD-10-CM | POA: Diagnosis not present

## 2016-02-10 DIAGNOSIS — I10 Essential (primary) hypertension: Secondary | ICD-10-CM | POA: Insufficient documentation

## 2016-02-10 HISTORY — DX: Thyrotoxicosis, unspecified without thyrotoxic crisis or storm: E05.90

## 2016-02-10 LAB — BASIC METABOLIC PANEL
Anion gap: 7 (ref 5–15)
BUN: 17 mg/dL (ref 6–20)
CHLORIDE: 105 mmol/L (ref 101–111)
CO2: 27 mmol/L (ref 22–32)
CREATININE: 0.62 mg/dL (ref 0.44–1.00)
Calcium: 9.5 mg/dL (ref 8.9–10.3)
GFR calc Af Amer: >60 mL/min (ref >60)
GFR calc non Af Amer: >60 mL/min (ref >60)
GLUCOSE: 107 mg/dL — AB (ref 65–99)
Potassium: 3.8 mmol/L (ref 3.5–5.1)
Sodium: 139 mmol/L (ref 135–145)

## 2016-02-10 LAB — CBC
HCT: 35.7 % — ABNORMAL LOW (ref 36.0–46.0)
Hemoglobin: 12.3 g/dL (ref 12.0–15.0)
MCH: 30.1 pg (ref 26.0–34.0)
MCHC: 34.5 g/dL (ref 30.0–36.0)
MCV: 87.5 fL (ref 78.0–100.0)
PLATELETS: 330 10*3/uL (ref 150–400)
RBC: 4.08 MIL/uL (ref 3.87–5.11)
RDW: 16.3 % — AB (ref 11.5–15.5)
WBC: 5.5 10*3/uL (ref 4.0–10.5)

## 2016-02-10 NOTE — Consult Note (Signed)
[facility] Nurse requested for preoperative stoma site marking  Discussed surgical procedure and stoma creation with patient and family.  Explained role of the [facility] nurse team.  Provided the patient with educational booklet/DVD and provided samples of pouching options.  Answered patient and family questions.   Examined patient lying, sitting, and standing in order to place the marking in the patient's visual field, away from any creases or abdominal contour issues and within the rectus muscle.  Attempted to mark below the patient's belt line.   Marked for ileostomy in the RLQ  3 cm to the right of the umbilicus and  3  cm below the umbilicus.  Deep creasing noted at umbilicus.   Patient is tearful today and concerned about the idea of a stoma.  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  Discussed immediate post surgical limitations and long term ability to return to work.  Emotional support is provided.  Patient's abdomen cleansed with CHG wipes at site markings, allowed to air dry prior to marking.Covered mark with thin film transparent dressing to preserve mark until date of surgery.   [facility] Nurse team will follow up with patient after surgery for continue ostomy care and teaching.   Domenic Moras RN BSN [facility] Pager [phone]

## 2016-02-10 NOTE — Patient Instructions (Addendum)
Destiny [name]  02/10/2016   Your procedure is scheduled on: Wednesday 02/26/2016  Report to [facility] Main  Entrance take [facility]  elevators to 3rd floor to  [place] at  [place] AM.  Call this number if you have problems the morning of surgery [phone]   Remember: ONLY 1 PERSON MAY GO WITH YOU TO SHORT STAY TO GET  READY MORNING OF [place].                         FOLLOW BOWEL PREP INSTRUCTIONS FROM DR. THOMAS OFFICE AND A CLEAR LIQUID DIET ! The day before surgery!   CLEAR LIQUID DIET   Foods Allowed                                                                     Foods Excluded  Coffee and tea, regular and decaf                             liquids that you cannot  Plain Jell-O in any flavor                                             see through such as: Fruit ices (not with fruit pulp)                                     milk, soups, orange juice  Iced Popsicles                                    All solid food Carbonated beverages, regular and diet                                    Cranberry, grape and apple juices Sports drinks like Gatorade Lightly seasoned clear broth or consume(fat free) Sugar, honey syrup  Sample Menu Breakfast                                Lunch                                     Supper Cranberry juice                    Beef broth                            Chicken broth Jell-O  Grape juice                           Apple juice Coffee or tea                        Jell-O                                      Popsicle                                                Coffee or tea                        Coffee or tea  _____________________________________________________________________     Do not eat food or drink liquids :After Midnight.     Take these medicines the morning of surgery with A SIP OF WATER: GAPAPENTIN,  SERTRALNE (ZOLFT)                                  You may not have any metal on your body including hair pins and              piercings  Do not wear jewelry, make-up, lotions, powders or perfumes, deodorant             Do not wear nail polish.  Do not shave  48 hours prior to surgery.              Men may shave face and neck.   Do not bring valuables to the hospital. [place].  Contacts, dentures or bridgework may not be worn into surgery.  Leave suitcase in the car. After surgery it may be brought to your room.                 Please read over the following fact sheets you were given: _____________________________________________________________________             [facility] - Preparing for Surgery Before surgery, you can play an important role.  Because skin is not sterile, your skin needs to be as free of germs as possible.  You can reduce the number of germs on your skin by washing with CHG (chlorahexidine gluconate) soap before surgery.  CHG is an antiseptic cleaner which kills germs and bonds with the skin to continue killing germs even after washing. Please DO NOT use if you have an allergy to CHG or antibacterial soaps.  If your skin becomes reddened/irritated stop using the CHG and inform your nurse when you arrive at Short Stay. Do not shave (including legs and underarms) for at least 48 hours prior to the first CHG shower.  You may shave your face/neck. Please follow these instructions carefully:  1.  Shower with CHG Soap the night before surgery and the  morning of Surgery.  2.  If you choose to wash your hair, wash your hair first as usual with your  normal  shampoo.  3.  After you shampoo, rinse your  hair and body thoroughly to remove the  shampoo.                           4.  Use CHG as you would any other liquid soap.  You can apply chg directly  to the skin and wash                       Gently with a scrungie or clean washcloth.  5.  Apply the CHG Soap to your body  ONLY FROM THE NECK DOWN.   Do not use on face/ open                           Wound or open sores. Avoid contact with eyes, ears mouth and genitals (private parts).                       Wash face,  Genitals (private parts) with your normal soap.             6.  Wash thoroughly, paying special attention to the area where your surgery  will be performed.  7.  Thoroughly rinse your body with warm water from the neck down.  8.  DO NOT shower/wash with your normal soap after using and rinsing off  the CHG Soap.                9.  Pat yourself dry with a clean towel.            10.  Wear clean pajamas.            11.  Place clean sheets on your bed the night of your first shower and do not  sleep with pets. Day of Surgery : Do not apply any lotions/deodorants the morning of surgery.  Please wear clean clothes to the hospital/surgery center.  FAILURE TO FOLLOW THESE INSTRUCTIONS MAY RESULT IN THE CANCELLATION OF YOUR SURGERY PATIENT SIGNATURE_________________________________  NURSE SIGNATURE__________________________________  ________________________________________________________________________   Adam [place]ix  An incentive spirometer is a tool that can help keep your lungs clear and active. This tool measures how well you are filling your lungs with each breath. Taking long deep breaths may help reverse or decrease the chance of developing breathing (pulmonary) problems (especially infection) following:  A long period of time when you are unable to move or be active. BEFORE THE PROCEDURE   If the spirometer includes an indicator to show your best effort, your nurse or respiratory therapist will set it to a desired goal.  If possible, sit up straight or lean slightly forward. Try not to slouch.  Hold the incentive spirometer in an upright position. INSTRUCTIONS FOR USE  1. Sit on the edge of your bed if possible, or sit up as far as you can in bed or on a chair. 2. Hold the  incentive spirometer in an upright position. 3. Breathe out normally. 4. Place the mouthpiece in your mouth and seal your lips tightly around it. 5. Breathe in slowly and as deeply as possible, raising the piston or the ball toward the top of the column. 6. Hold your breath for 3-5 seconds or for as long as possible. Allow the piston or ball to fall to the bottom of the column. 7. Remove the mouthpiece from your mouth and breathe out normally. 8. Rest for a few seconds and repeat  Steps 1 through 7 at least 10 times every 1-2 hours when you are awake. Take your time and take a few normal breaths between deep breaths. 9. The spirometer may include an indicator to show your best effort. Use the indicator as a goal to work toward during each repetition. 10. After each set of 10 deep breaths, practice coughing to be sure your lungs are clear. If you have an incision (the cut made at the time of surgery), support your incision when coughing by placing a pillow or rolled up towels firmly against it. Once you are able to get out of bed, walk around indoors and cough well. You may stop using the incentive spirometer when instructed by your caregiver.  RISKS AND COMPLICATIONS  Take your time so you do not get dizzy or light-headed.  If you are in pain, you may need to take or ask for pain medication before doing incentive spirometry. It is harder to take a deep breath if you are having pain. AFTER USE  Rest and breathe slowly and easily.  It can be helpful to keep track of a log of your progress. Your caregiver can provide you with a simple table to help with this. If you are using the spirometer at home, follow these instructions: [place] IF:   You are having difficultly using the spirometer.  You have trouble using the spirometer as often as instructed.  Your pain medication is not giving enough relief while using the spirometer.  You develop fever of 100.5 F (38.1 C) or  higher. SEEK IMMEDIATE MEDICAL CARE IF:   You cough up bloody sputum that had not been present before.  You develop fever of 102 F (38.9 C) or greater.  You develop worsening pain at or near the incision site. MAKE SURE YOU:   Understand these instructions.  Will watch your condition.  Will get help right away if you are not doing well or get worse. Document Released: 09/28/2006 Document Revised: 08/10/2011 Document Reviewed: 11/29/2006 Exit[employer] Patient Information 2014 Exit[employer], [place].   ________________________________________________________________________  WHAT IS A BLOOD TRANSFUSION? Blood Transfusion Information  A transfusion is the replacement of blood or some of its parts. Blood is made up of multiple cells which provide different functions.  Red blood cells carry oxygen and are used for blood loss replacement.  White blood cells fight against infection.  Platelets control bleeding.  Plasma helps clot blood.  Other blood products are available for specialized needs, such as hemophilia or other clotting disorders. BEFORE THE TRANSFUSION  Who gives blood for transfusions?   Healthy volunteers who are fully evaluated to make sure their blood is safe. This is blood bank blood. Transfusion therapy is the safest it has ever been in the practice of medicine. Before blood is taken from a donor, a complete history is taken to make sure that person has no history of diseases nor engages in risky social behavior (examples are intravenous drug use or sexual activity with multiple partners). The donor's travel history is screened to minimize risk of transmitting infections, such as malaria. The donated blood is tested for signs of infectious diseases, such as HIV and hepatitis. The blood is then tested to be sure it is compatible with you in order to minimize the chance of a transfusion reaction. If you or a relative donates blood, this is often done in anticipation of surgery  and is not appropriate for emergency situations. It takes many days to process the donated blood.  RISKS AND COMPLICATIONS Although transfusion therapy is very safe and saves many lives, the main dangers of transfusion include:   Getting an infectious disease.  Developing a transfusion reaction. This is an allergic reaction to something in the blood you were given. Every precaution is taken to prevent this. The decision to have a blood transfusion has been considered carefully by your caregiver before blood is given. Blood is not given unless the benefits outweigh the risks. AFTER THE TRANSFUSION  Right after receiving a blood transfusion, you will usually feel much better and more energetic. This is especially true if your red blood cells have gotten low (anemic). The transfusion raises the level of the red blood cells which carry oxygen, and this usually causes an energy increase.  The nurse administering the transfusion will monitor you carefully for complications. HOME CARE INSTRUCTIONS  No special instructions are needed after a transfusion. You may find your energy is better. Speak with your caregiver about any limitations on activity for underlying diseases you may have. SEEK MEDICAL CARE IF:   Your condition is not improving after your transfusion.  You develop redness or irritation at the intravenous (IV) site. SEEK IMMEDIATE MEDICAL CARE IF:  Any of the following symptoms occur over the next 12 hours:  Shaking chills.  You have a temperature by mouth above 102 F (38.9 C), not controlled by medicine.  Chest, back, or muscle pain.  People around you feel you are not acting correctly or are confused.  Shortness of breath or difficulty breathing.  Dizziness and fainting.  You get a rash or develop hives.  You have a decrease in urine output.  Your urine turns a dark color or changes to pink, red, or brown. Any of the following symptoms occur over the next 10  days:  You have a temperature by mouth above 102 F (38.9 C), not controlled by medicine.  Shortness of breath.  Weakness after normal activity.  The white part of the eye turns yellow (jaundice).  You have a decrease in the amount of urine or are urinating less often.  Your urine turns a dark color or changes to pink, red, or brown. Document Released: 05/15/2000 Document Revised: 08/10/2011 Document Reviewed: 01/02/2008 [facility] Patient Information 2014 [place], [place].  _______________________________________________________________________

## 2016-02-11 LAB — HEMOGLOBIN A1C
HEMOGLOBIN A1C: 5.2 % (ref 4.8–5.6)
Mean Plasma Glucose: 103 mg/dL

## 2016-02-13 LAB — PAP IG, RFX HPV ALL PTH: PAP Smear Comment: 0

## 2016-02-13 LAB — HPV DNA PROBE HIGH RISK, AMPLIFIED: HPV, HIGH-RISK: NEGATIVE

## 2016-02-14 ENCOUNTER — Telehealth: Payer: Self-pay | Admitting: Family Medicine

## 2016-02-14 NOTE — Telephone Encounter (Signed)
Spoke with pt regarding recent labs Pt verbalizes understanding

## 2016-02-14 NOTE — Telephone Encounter (Signed)
Patient aware of labs.  

## 2016-02-18 ENCOUNTER — Ambulatory Visit: Payer: BLUE CROSS/BLUE SHIELD

## 2016-02-19 ENCOUNTER — Ambulatory Visit (INDEPENDENT_AMBULATORY_CARE_PROVIDER_SITE_OTHER): Payer: BLUE CROSS/BLUE SHIELD

## 2016-02-19 DIAGNOSIS — Z23 Encounter for immunization: Secondary | ICD-10-CM

## 2016-02-20 ENCOUNTER — Other Ambulatory Visit: Payer: Self-pay | Admitting: Family Medicine

## 2016-02-20 DIAGNOSIS — M545 Low back pain, unspecified: Secondary | ICD-10-CM

## 2016-02-20 DIAGNOSIS — F411 Generalized anxiety disorder: Secondary | ICD-10-CM

## 2016-02-20 DIAGNOSIS — F32A Depression, unspecified: Secondary | ICD-10-CM

## 2016-02-20 DIAGNOSIS — F329 Major depressive disorder, single episode, unspecified: Secondary | ICD-10-CM

## 2016-02-21 ENCOUNTER — Other Ambulatory Visit: Payer: Self-pay

## 2016-02-24 ENCOUNTER — Other Ambulatory Visit: Payer: Self-pay

## 2016-02-24 NOTE — Telephone Encounter (Signed)
If patient has another infection or persistent infection that she needs to come in to be seen.

## 2016-02-26 ENCOUNTER — Inpatient Hospital Stay (HOSPITAL_COMMUNITY): Payer: BLUE CROSS/BLUE SHIELD | Admitting: Anesthesiology

## 2016-02-26 ENCOUNTER — Inpatient Hospital Stay (HOSPITAL_COMMUNITY)
Admission: RE | Admit: 2016-02-26 | Discharge: 2016-03-05 | DRG: 331 | Disposition: A | Discharge status: Home Health | Payer: BLUE CROSS/BLUE SHIELD | Source: Ambulatory Visit | Attending: General Surgery | Admitting: General Surgery

## 2016-02-26 ENCOUNTER — Encounter (HOSPITAL_COMMUNITY)
Admission: RE | Disposition: A | Discharge status: Home Health | Payer: Self-pay | Source: Ambulatory Visit | Attending: General Surgery

## 2016-02-26 ENCOUNTER — Encounter (HOSPITAL_COMMUNITY): Payer: Self-pay | Admitting: General Practice

## 2016-02-26 DIAGNOSIS — Z818 Family history of other mental and behavioral disorders: Secondary | ICD-10-CM

## 2016-02-26 DIAGNOSIS — R1110 Vomiting, unspecified: Secondary | ICD-10-CM | POA: Diagnosis not present

## 2016-02-26 DIAGNOSIS — F329 Major depressive disorder, single episode, unspecified: Secondary | ICD-10-CM | POA: Diagnosis present

## 2016-02-26 DIAGNOSIS — Z800 Family history of malignant neoplasm of digestive organs: Secondary | ICD-10-CM | POA: Diagnosis not present

## 2016-02-26 DIAGNOSIS — Z9221 Personal history of antineoplastic chemotherapy: Secondary | ICD-10-CM | POA: Diagnosis not present

## 2016-02-26 DIAGNOSIS — F419 Anxiety disorder, unspecified: Secondary | ICD-10-CM | POA: Diagnosis present

## 2016-02-26 DIAGNOSIS — C19 Malignant neoplasm of rectosigmoid junction: Secondary | ICD-10-CM | POA: Diagnosis present

## 2016-02-26 DIAGNOSIS — C20 Malignant neoplasm of rectum: Secondary | ICD-10-CM | POA: Diagnosis present

## 2016-02-26 DIAGNOSIS — Z923 Personal history of irradiation: Secondary | ICD-10-CM | POA: Diagnosis not present

## 2016-02-26 DIAGNOSIS — E669 Obesity, unspecified: Secondary | ICD-10-CM | POA: Diagnosis present

## 2016-02-26 DIAGNOSIS — Z811 Family history of alcohol abuse and dependence: Secondary | ICD-10-CM | POA: Diagnosis not present

## 2016-02-26 DIAGNOSIS — Z6836 Body mass index (BMI) 36.0-36.9, adult: Secondary | ICD-10-CM | POA: Diagnosis not present

## 2016-02-26 DIAGNOSIS — Z8249 Family history of ischemic heart disease and other diseases of the circulatory system: Secondary | ICD-10-CM | POA: Diagnosis not present

## 2016-02-26 HISTORY — PX: XI ROBOTIC ASSISTED LOWER ANTERIOR RESECTION: SHX6558

## 2016-02-26 HISTORY — PX: URETERAL REIMPLANTION: SHX2611

## 2016-02-26 LAB — TYPE AND SCREEN
ABO/RH(D): O POS
ANTIBODY SCREEN: NEGATIVE

## 2016-02-26 LAB — ABO/RH: ABO/RH(D): O POS

## 2016-02-26 SURGERY — RESECTION, RECTUM, LOW ANTERIOR, ROBOT-ASSISTED
Anesthesia: General

## 2016-02-26 MED ORDER — KETAMINE HCL 10 MG/ML IJ SOLN
INTRAMUSCULAR | Status: AC
  Filled 2016-02-26: qty 1

## 2016-02-26 MED ORDER — MEPERIDINE HCL 50 MG/ML IJ SOLN: 6.2500 mg | INTRAMUSCULAR | Status: DC | PRN

## 2016-02-26 MED ORDER — HYDROCHLOROTHIAZIDE 25 MG PO TABS
12.5000 mg | ORAL_TABLET | Freq: Every day | ORAL | Status: DC
  Administered 2016-02-27 – 2016-02-29 (×3): 12.5 mg via ORAL
  Administered 2016-03-01: 10:00:00 via ORAL
  Administered 2016-03-02 – 2016-03-05 (×4): 12.5 mg via ORAL
  Filled 2016-02-26 (×8): qty 1

## 2016-02-26 MED ORDER — FENTANYL CITRATE (PF) 100 MCG/2ML IJ SOLN
INTRAMUSCULAR | Status: DC | PRN
  Administered 2016-02-26 (×3): 100 ug via INTRAVENOUS
  Administered 2016-02-26: 50 ug via INTRAVENOUS
  Administered 2016-02-26: 100 ug via INTRAVENOUS
  Administered 2016-02-26 (×2): 50 ug via INTRAVENOUS

## 2016-02-26 MED ORDER — LACTATED RINGERS IV SOLN: INTRAVENOUS | Status: DC

## 2016-02-26 MED ORDER — DEXTROSE 5 % IV SOLN
2.0000 g | Freq: Two times a day (BID) | INTRAVENOUS | Status: AC
  Administered 2016-02-27: 2 g via INTRAVENOUS
  Filled 2016-02-26: qty 2

## 2016-02-26 MED ORDER — ACETAMINOPHEN 10 MG/ML IV SOLN
INTRAVENOUS | Status: AC
  Filled 2016-02-26: qty 100

## 2016-02-26 MED ORDER — HYDROMORPHONE HCL 1 MG/ML IJ SOLN
INTRAMUSCULAR | Status: AC
  Administered 2016-02-26: 0.5 mg via INTRAVENOUS
  Filled 2016-02-26: qty 1

## 2016-02-26 MED ORDER — MIDAZOLAM HCL 2 MG/2ML IJ SOLN
INTRAMUSCULAR | Status: AC
  Filled 2016-02-26: qty 2

## 2016-02-26 MED ORDER — MIDAZOLAM HCL 5 MG/5ML IJ SOLN
INTRAMUSCULAR | Status: DC | PRN
  Administered 2016-02-26: 2 mg via INTRAVENOUS

## 2016-02-26 MED ORDER — ENOXAPARIN SODIUM 40 MG/0.4ML ~~LOC~~ SOLN
40.0000 mg | SUBCUTANEOUS | Status: DC
Start: 2016-02-27 — End: 2016-03-05
  Administered 2016-02-27 – 2016-03-05 (×8): 40 mg via SUBCUTANEOUS
  Filled 2016-02-26 (×8): qty 0.4

## 2016-02-26 MED ORDER — CEFOTETAN DISODIUM-DEXTROSE 2-2.08 GM-% IV SOLR
INTRAVENOUS | Status: AC
  Filled 2016-02-26: qty 50

## 2016-02-26 MED ORDER — ROCURONIUM BROMIDE 10 MG/ML (PF) SYRINGE
PREFILLED_SYRINGE | INTRAVENOUS | Status: DC | PRN
  Administered 2016-02-26: 30 mg via INTRAVENOUS
  Administered 2016-02-26 (×4): 20 mg via INTRAVENOUS
  Administered 2016-02-26: 30 mg via INTRAVENOUS
  Administered 2016-02-26: 20 mg via INTRAVENOUS
  Administered 2016-02-26: 10 mg via INTRAVENOUS
  Administered 2016-02-26: 50 mg via INTRAVENOUS

## 2016-02-26 MED ORDER — ROCURONIUM BROMIDE 10 MG/ML (PF) SYRINGE
PREFILLED_SYRINGE | INTRAVENOUS | Status: AC
  Filled 2016-02-26: qty 10

## 2016-02-26 MED ORDER — FENTANYL CITRATE (PF) 250 MCG/5ML IJ SOLN
INTRAMUSCULAR | Status: AC
  Filled 2016-02-26: qty 5

## 2016-02-26 MED ORDER — DIPHENHYDRAMINE HCL 50 MG/ML IJ SOLN: 25.0000 mg | Freq: Four times a day (QID) | INTRAMUSCULAR | Status: DC | PRN

## 2016-02-26 MED ORDER — PROMETHAZINE HCL 25 MG/ML IJ SOLN: 6.2500 mg | INTRAMUSCULAR | Status: DC | PRN

## 2016-02-26 MED ORDER — HYDROMORPHONE HCL 1 MG/ML IJ SOLN
0.5000 mg | INTRAMUSCULAR | Status: DC | PRN
  Administered 2016-02-26 – 2016-02-28 (×13): 1 mg via INTRAVENOUS
  Filled 2016-02-26 (×13): qty 1

## 2016-02-26 MED ORDER — LORAZEPAM 0.5 MG PO TABS
0.5000 mg | ORAL_TABLET | Freq: Every day | ORAL | Status: DC
  Administered 2016-02-26 – 2016-03-04 (×8): 0.5 mg via ORAL
  Filled 2016-02-26 (×8): qty 1

## 2016-02-26 MED ORDER — LACTATED RINGERS IR SOLN
Status: DC | PRN
Start: 2016-02-26 — End: 2016-02-26
  Administered 2016-02-26: 1000 mL

## 2016-02-26 MED ORDER — SUGAMMADEX SODIUM 200 MG/2ML IV SOLN
INTRAVENOUS | Status: DC | PRN
  Administered 2016-02-26: 200 mg via INTRAVENOUS

## 2016-02-26 MED ORDER — HEPARIN SODIUM (PORCINE) 5000 UNIT/ML IJ SOLN
5000.0000 [IU] | Freq: Once | INTRAMUSCULAR | Status: AC
  Administered 2016-02-26: 5000 [IU] via SUBCUTANEOUS
  Filled 2016-02-26: qty 1

## 2016-02-26 MED ORDER — PROPOFOL 10 MG/ML IV BOLUS
INTRAVENOUS | Status: DC | PRN
  Administered 2016-02-26: 150 mg via INTRAVENOUS
  Administered 2016-02-26: 30 mg via INTRAVENOUS

## 2016-02-26 MED ORDER — DEXTROSE 5 % IV SOLN
2.0000 g | INTRAVENOUS | Status: AC
  Administered 2016-02-26 (×2): 2 g via INTRAVENOUS

## 2016-02-26 MED ORDER — 0.9 % SODIUM CHLORIDE (POUR BTL) OPTIME
TOPICAL | Status: DC | PRN
  Administered 2016-02-26: 2000 mL

## 2016-02-26 MED ORDER — KCL IN DEXTROSE-NACL 20-5-0.45 MEQ/L-%-% IV SOLN
INTRAVENOUS | Status: DC
  Administered 2016-02-26 – 2016-02-27 (×2): via INTRAVENOUS
  Administered 2016-02-27: 100 mL/h via INTRAVENOUS
  Administered 2016-02-27 – 2016-03-01 (×7): via INTRAVENOUS
  Filled 2016-02-26 (×14): qty 1000

## 2016-02-26 MED ORDER — ARTIFICIAL TEARS OP OINT
TOPICAL_OINTMENT | OPHTHALMIC | Status: AC
  Filled 2016-02-26: qty 3.5

## 2016-02-26 MED ORDER — METHOCARBAMOL 500 MG PO TABS
500.0000 mg | ORAL_TABLET | Freq: Three times a day (TID) | ORAL | Status: DC | PRN
  Administered 2016-02-27 – 2016-03-03 (×5): 500 mg via ORAL
  Filled 2016-02-26 (×5): qty 1

## 2016-02-26 MED ORDER — DEXAMETHASONE SODIUM PHOSPHATE 10 MG/ML IJ SOLN
INTRAMUSCULAR | Status: DC | PRN
  Administered 2016-02-26: 5 mg via INTRAVENOUS

## 2016-02-26 MED ORDER — DICLOFENAC SODIUM 75 MG PO TBEC
75.0000 mg | DELAYED_RELEASE_TABLET | Freq: Two times a day (BID) | ORAL | Status: DC
  Administered 2016-02-27 – 2016-03-05 (×15): 75 mg via ORAL
  Filled 2016-02-26 (×16): qty 1

## 2016-02-26 MED ORDER — LABETALOL HCL 5 MG/ML IV SOLN
INTRAVENOUS | Status: DC | PRN
  Administered 2016-02-26 (×2): 2.5 mg via INTRAVENOUS

## 2016-02-26 MED ORDER — LIDOCAINE 2% (20 MG/ML) 5 ML SYRINGE
INTRAMUSCULAR | Status: AC
  Filled 2016-02-26: qty 5

## 2016-02-26 MED ORDER — BUPIVACAINE-EPINEPHRINE 0.5% -1:200000 IJ SOLN
INTRAMUSCULAR | Status: AC
  Filled 2016-02-26: qty 1

## 2016-02-26 MED ORDER — LABETALOL HCL 5 MG/ML IV SOLN
INTRAVENOUS | Status: AC
  Filled 2016-02-26: qty 4

## 2016-02-26 MED ORDER — SUCCINYLCHOLINE CHLORIDE 200 MG/10ML IV SOSY
PREFILLED_SYRINGE | INTRAVENOUS | Status: DC | PRN
  Administered 2016-02-26: 100 mg via INTRAVENOUS

## 2016-02-26 MED ORDER — GABAPENTIN 300 MG PO CAPS
300.0000 mg | ORAL_CAPSULE | Freq: Every day | ORAL | Status: DC
  Administered 2016-02-27 – 2016-03-04 (×7): 300 mg via ORAL
  Filled 2016-02-26 (×8): qty 1

## 2016-02-26 MED ORDER — ONDANSETRON HCL 4 MG/2ML IJ SOLN
INTRAMUSCULAR | Status: DC | PRN
  Administered 2016-02-26: 4 mg via INTRAVENOUS

## 2016-02-26 MED ORDER — PROPOFOL 10 MG/ML IV BOLUS
INTRAVENOUS | Status: AC
  Filled 2016-02-26: qty 20

## 2016-02-26 MED ORDER — SUGAMMADEX SODIUM 200 MG/2ML IV SOLN
INTRAVENOUS | Status: AC
  Filled 2016-02-26: qty 2

## 2016-02-26 MED ORDER — ONDANSETRON HCL 4 MG PO TABS: 4.0000 mg | ORAL_TABLET | Freq: Four times a day (QID) | ORAL | Status: DC | PRN

## 2016-02-26 MED ORDER — ALVIMOPAN 12 MG PO CAPS
12.0000 mg | ORAL_CAPSULE | Freq: Two times a day (BID) | ORAL | Status: DC
  Administered 2016-02-27 – 2016-03-01 (×8): 12 mg via ORAL
  Filled 2016-02-26 (×8): qty 1

## 2016-02-26 MED ORDER — ONDANSETRON HCL 4 MG/2ML IJ SOLN
INTRAMUSCULAR | Status: AC
  Filled 2016-02-26: qty 2

## 2016-02-26 MED ORDER — ACETAMINOPHEN 10 MG/ML IV SOLN
INTRAVENOUS | Status: DC | PRN
  Administered 2016-02-26: 1000 mg via INTRAVENOUS

## 2016-02-26 MED ORDER — BUPIVACAINE-EPINEPHRINE (PF) 0.5% -1:200000 IJ SOLN
INTRAMUSCULAR | Status: DC | PRN
  Administered 2016-02-26: 28 mL

## 2016-02-26 MED ORDER — ONDANSETRON HCL 4 MG/2ML IJ SOLN
4.0000 mg | Freq: Four times a day (QID) | INTRAMUSCULAR | Status: DC | PRN
  Administered 2016-02-27 – 2016-03-05 (×9): 4 mg via INTRAVENOUS
  Filled 2016-02-26 (×9): qty 2

## 2016-02-26 MED ORDER — SERTRALINE HCL 50 MG PO TABS
50.0000 mg | ORAL_TABLET | Freq: Every day | ORAL | Status: DC
  Administered 2016-02-27 – 2016-03-05 (×8): 50 mg via ORAL
  Filled 2016-02-26 (×8): qty 1

## 2016-02-26 MED ORDER — LIDOCAINE 2% (20 MG/ML) 5 ML SYRINGE
INTRAMUSCULAR | Status: DC | PRN
  Administered 2016-02-26: 75 mg via INTRAVENOUS

## 2016-02-26 MED ORDER — ACETAMINOPHEN 500 MG PO TABS
1000.0000 mg | ORAL_TABLET | Freq: Four times a day (QID) | ORAL | Status: AC
  Administered 2016-02-26 – 2016-02-27 (×4): 1000 mg via ORAL
  Filled 2016-02-26 (×5): qty 2

## 2016-02-26 MED ORDER — HYDROMORPHONE HCL 1 MG/ML IJ SOLN
0.2500 mg | INTRAMUSCULAR | Status: DC | PRN
  Administered 2016-02-26 (×4): 0.5 mg via INTRAVENOUS

## 2016-02-26 MED ORDER — ALVIMOPAN 12 MG PO CAPS
12.0000 mg | ORAL_CAPSULE | Freq: Once | ORAL | Status: AC
  Administered 2016-02-26: 12 mg via ORAL
  Filled 2016-02-26: qty 1

## 2016-02-26 MED ORDER — FENTANYL CITRATE (PF) 100 MCG/2ML IJ SOLN
INTRAMUSCULAR | Status: AC
  Filled 2016-02-26: qty 2

## 2016-02-26 MED ORDER — DEXAMETHASONE SODIUM PHOSPHATE 10 MG/ML IJ SOLN
INTRAMUSCULAR | Status: AC
  Filled 2016-02-26: qty 1

## 2016-02-26 MED ORDER — KETAMINE HCL 10 MG/ML IJ SOLN
INTRAMUSCULAR | Status: DC | PRN
Start: 2016-02-26 — End: 2016-02-26
  Administered 2016-02-26 (×2): 25 mg via INTRAVENOUS
  Administered 2016-02-26: 20 mg via INTRAVENOUS
  Administered 2016-02-26: 30 mg via INTRAVENOUS

## 2016-02-26 MED ORDER — LACTATED RINGERS IV SOLN
INTRAVENOUS | Status: DC | PRN
  Administered 2016-02-26 (×4): via INTRAVENOUS

## 2016-02-26 MED ORDER — DIPHENHYDRAMINE HCL 25 MG PO CAPS: 25.0000 mg | ORAL_CAPSULE | Freq: Four times a day (QID) | ORAL | Status: DC | PRN

## 2016-02-26 SURGICAL SUPPLY — 111 items
APPLIER CLIP 5 13 M/L LIGAMAX5 (MISCELLANEOUS) ×4
APR CLP MED LRG 5 ANG JAW (MISCELLANEOUS) ×2
BLADE EXTENDED COATED 6.5IN (ELECTRODE) IMPLANT
CANNULA REDUC XI 12-8 STAPL (CANNULA) ×1
CANNULA REDUC XI 12-8MM STAPL (CANNULA) ×1
CANNULA REDUCER 12-8 DVNC XI (CANNULA) ×2 IMPLANT
CATH SILICONE 5CC 18FR (INSTRUMENTS) ×2 IMPLANT
CELLS DAT CNTRL 66122 CELL SVR (MISCELLANEOUS) IMPLANT
CHLORAPREP W/TINT 26ML (MISCELLANEOUS) ×2 IMPLANT
CLIP APPLIE 5 13 M/L LIGAMAX5 (MISCELLANEOUS) IMPLANT
CLIP LIGATING HEM O LOK PURPLE (MISCELLANEOUS) IMPLANT
CLIP LIGATING HEMOLOK MED (MISCELLANEOUS) IMPLANT
COUNTER NEEDLE 20 DBL MAG RED (NEEDLE) ×4 IMPLANT
COVER MAYO STAND STRL (DRAPES) ×8 IMPLANT
COVER TIP SHEARS 8 DVNC (MISCELLANEOUS) ×2 IMPLANT
COVER TIP SHEARS 8MM DA VINCI (MISCELLANEOUS) ×2
DECANTER SPIKE VIAL GLASS SM (MISCELLANEOUS) ×4 IMPLANT
DEVICE TROCAR PUNCTURE CLOSURE (ENDOMECHANICALS) IMPLANT
DRAIN CHANNEL 19F RND (DRAIN) ×2 IMPLANT
DRAPE ARM DVNC X/XI (DISPOSABLE) ×8 IMPLANT
DRAPE COLUMN DVNC XI (DISPOSABLE) ×2 IMPLANT
DRAPE DA VINCI XI ARM (DISPOSABLE) ×8
DRAPE DA VINCI XI COLUMN (DISPOSABLE) ×2
DRAPE SURG IRRIG POUCH 19X23 (DRAPES) ×4 IMPLANT
DRSG OPSITE POSTOP 4X10 (GAUZE/BANDAGES/DRESSINGS) IMPLANT
DRSG OPSITE POSTOP 4X6 (GAUZE/BANDAGES/DRESSINGS) ×2 IMPLANT
DRSG OPSITE POSTOP 4X8 (GAUZE/BANDAGES/DRESSINGS) IMPLANT
DRSG TEGADERM 4X4.75 (GAUZE/BANDAGES/DRESSINGS) ×2 IMPLANT
ELECT PENCIL ROCKER SW 15FT (MISCELLANEOUS) ×8 IMPLANT
ELECT REM PT RETURN 15FT ADLT (MISCELLANEOUS) ×4 IMPLANT
ENDOLOOP SUT PDS II  0 18 (SUTURE)
ENDOLOOP SUT PDS II 0 18 (SUTURE) IMPLANT
EVACUATOR SILICONE 100CC (DRAIN) ×2 IMPLANT
GAUZE SPONGE 4X4 12PLY STRL (GAUZE/BANDAGES/DRESSINGS) ×2 IMPLANT
GLOVE BIO SURGEON STRL SZ 6.5 (GLOVE) ×9 IMPLANT
GLOVE BIO SURGEONS STRL SZ 6.5 (GLOVE) ×3
GLOVE BIOGEL PI IND STRL 7.0 (GLOVE) ×6 IMPLANT
GLOVE BIOGEL PI IND STRL 7.5 (GLOVE) IMPLANT
GLOVE BIOGEL PI INDICATOR 7.0 (GLOVE) ×12
GLOVE BIOGEL PI INDICATOR 7.5 (GLOVE) ×6
GOWN STRL REUS W/TWL 2XL LVL3 (GOWN DISPOSABLE) ×12 IMPLANT
GOWN STRL REUS W/TWL XL LVL3 (GOWN DISPOSABLE) ×16 IMPLANT
GRASPER ENDOPATH ANVIL 10MM (MISCELLANEOUS) IMPLANT
HOLDER FOLEY CATH W/STRAP (MISCELLANEOUS) ×4 IMPLANT
IRRIG SUCT STRYKERFLOW 2 WTIP (MISCELLANEOUS) ×4
IRRIGATION SUCT STRKRFLW 2 WTP (MISCELLANEOUS) ×2 IMPLANT
KIT PROCEDURE DA VINCI SI (MISCELLANEOUS)
KIT PROCEDURE DVNC SI (MISCELLANEOUS) ×2 IMPLANT
LEGGING LITHOTOMY PAIR STRL (DRAPES) ×4 IMPLANT
NDL INSUFFLATION 14GA 120MM (NEEDLE) ×2 IMPLANT
NEEDLE INSUFFLATION 14GA 120MM (NEEDLE) ×4 IMPLANT
PACK CARDIOVASCULAR III (CUSTOM PROCEDURE TRAY) ×4 IMPLANT
PACK COLON (CUSTOM PROCEDURE TRAY) ×4 IMPLANT
PLUG CATH AND CAP STER (CATHETERS) ×2 IMPLANT
PORT LAP GEL ALEXIS MED 5-9CM (MISCELLANEOUS) ×2 IMPLANT
RELOAD STAPLE 45 BLU REG DVNC (STAPLE) IMPLANT
RELOAD STAPLE 45 GRN THCK DVNC (STAPLE) IMPLANT
RETRACTOR WND ALEXIS 18 MED (MISCELLANEOUS) IMPLANT
RTRCTR WOUND ALEXIS 18CM MED (MISCELLANEOUS)
SCISSORS LAP 5X35 DISP (ENDOMECHANICALS) ×4 IMPLANT
SEAL CANN UNIV 5-8 DVNC XI (MISCELLANEOUS) ×6 IMPLANT
SEAL XI 5MM-8MM UNIVERSAL (MISCELLANEOUS) ×6
SEALER VESSEL DA VINCI XI (MISCELLANEOUS) ×2
SEALER VESSEL EXT DVNC XI (MISCELLANEOUS) ×2 IMPLANT
SET BI-LUMEN FLTR TB AIRSEAL (TUBING) ×4 IMPLANT
SLEEVE ADV FIXATION 5X100MM (TROCAR) IMPLANT
SOLUTION ELECTROLUBE (MISCELLANEOUS) ×6 IMPLANT
STAPLER 45 BLU RELOAD XI (STAPLE) ×4 IMPLANT
STAPLER 45 BLUE RELOAD XI (STAPLE) ×4
STAPLER 45 GREEN RELOAD XI (STAPLE)
STAPLER 45 GRN RELOAD XI (STAPLE) IMPLANT
STAPLER CANNULA SEAL DVNC XI (STAPLE) ×2 IMPLANT
STAPLER CANNULA SEAL XI (STAPLE) ×2
STAPLER SHEATH (SHEATH) ×2
STAPLER SHEATH ENDOWRIST DVNC (SHEATH) ×2 IMPLANT
STAPLER VISISTAT 35W (STAPLE) ×4 IMPLANT
STENT URET 6FRX24 CONTOUR (STENTS) ×2 IMPLANT
SUT ETHILON 2 0 PS N (SUTURE) IMPLANT
SUT NOVA NAB GS-21 0 18 T12 DT (SUTURE) ×8 IMPLANT
SUT PDS AB 1 CTX 36 (SUTURE) IMPLANT
SUT PDS AB 1 TP1 96 (SUTURE) IMPLANT
SUT PROLENE 2 0 KS (SUTURE) ×4 IMPLANT
SUT SILK 2 0 (SUTURE) ×4
SUT SILK 2 0 SH CR/8 (SUTURE) ×4 IMPLANT
SUT SILK 2-0 18XBRD TIE 12 (SUTURE) ×2 IMPLANT
SUT SILK 3 0 (SUTURE) ×4
SUT SILK 3 0 SH CR/8 (SUTURE) ×4 IMPLANT
SUT SILK 3-0 18XBRD TIE 12 (SUTURE) ×2 IMPLANT
SUT V-LOC BARB 180 2/0GR6 GS22 (SUTURE) ×8
SUT VIC AB 2-0 SH 18 (SUTURE) ×14 IMPLANT
SUT VIC AB 2-0 SH 27 (SUTURE) ×4
SUT VIC AB 2-0 SH 27X BRD (SUTURE) ×2 IMPLANT
SUT VIC AB 3-0 PS2 18 (SUTURE) ×8
SUT VIC AB 3-0 PS2 18XBRD (SUTURE) IMPLANT
SUT VIC AB 3-0 SH 18 (SUTURE) IMPLANT
SUT VIC AB 3-0 SH 27 (SUTURE) ×12
SUT VIC AB 3-0 SH 27XBRD (SUTURE) IMPLANT
SUT VIC AB 4-0 PS2 18 (SUTURE) ×2 IMPLANT
SUT VIC AB 4-0 PS2 27 (SUTURE) ×8 IMPLANT
SUTURE V-LC BRB 180 2/0GR6GS22 (SUTURE) IMPLANT
SYRINGE 10CC LL (SYRINGE) ×4 IMPLANT
SYS LAPSCP GELPORT 120MM (MISCELLANEOUS)
SYSTEM LAPSCP GELPORT 120MM (MISCELLANEOUS) IMPLANT
TOWEL OR 17X26 10 PK STRL BLUE (TOWEL DISPOSABLE) ×4 IMPLANT
TOWEL OR NON WOVEN STRL DISP B (DISPOSABLE) ×4 IMPLANT
TRAY FOLEY CATH SILVER 16FR LF (SET/KITS/TRAYS/PACK) ×2 IMPLANT
TRAY FOLEY MTR SLVR 18FR LF (CATHETERS) ×2 IMPLANT
TRAY FOLEY W/METER SILVER 16FR (SET/KITS/TRAYS/PACK) ×2 IMPLANT
TROCAR ADV FIXATION 5X100MM (TROCAR) ×4 IMPLANT
TUBING CONNECTING 10 (TUBING) IMPLANT
TUBING CONNECTING 10' (TUBING)

## 2016-02-26 NOTE — Transfer of Care (Signed)
Immediate Anesthesia Transfer of Care Note  Patient: Destiny [name]  Procedure(s) Performed: Procedure(s): XI ROBOTIC ASSISTED LOWER ANTERIOR RESECTION, diverting LOOP ILEOSTOMY, mobilization splenic flexure, omentopexy (N/A) left ureteral neocystotomy,double J stent placement, closure vaginal cuff (Left)  Patient Location: PACU  Anesthesia Type:General  Level of Consciousness:  sedated, patient cooperative and responds to stimulation  Airway & Oxygen Therapy:Patient Spontanous Breathing and Patient connected to face mask oxgen  Post-op Assessment:  Report given to PACU RN and Post -op Vital signs reviewed and stable  Post vital signs:  Reviewed and stable  Last Vitals:  Vitals:   02/26/16 0644  BP: 136/69  Pulse: 99  Resp: 18  Temp: 123XX123 C    Complications: No apparent anesthesia complications

## 2016-02-26 NOTE — Op Note (Signed)
02/26/2016  2:39 PM  PATIENT:  Destiny [name]  50 y.o. female  Patient Care Team: Worthy Rancher, MD as PCP - General (Family Medicine) Tania Ade, RN as Registered Nurse  PRE-OPERATIVE DIAGNOSIS:  rectal cancer  POST-OPERATIVE DIAGNOSIS:  rectal cancer  PROCEDURE:  Procedure(s): XI ROBOTIC ASSISTED LOWER ANTERIOR RESECTION WITH COLOANAL ANASTOMOSIS, LOOP ILEOSTOMY, SPLENIC FLEXURE MOBILIZATION, OMENTOPEXY left ureteral neocystotomy,double J stent placement, closure vaginal cuff  Surgeon(s): Leighton Ruff, MD Michael Boston, MD Bjorn Loser, MD Ardis Hughs, MD  ASSISTANT: Dr Johney Maine   ANESTHESIA:   local and general  EBL: 151ml Total I/O In: 2000 [I.V.:2000] Out: 240 [Urine:90; Blood:150]  Delay start of Pharmacological VTE agent (>24hrs) due to surgical blood loss or risk of bleeding:  no  DRAINS: (37F) Jackson-Pratt drain(s) with closed bulb suction in the pelvis   SPECIMEN:  Source of Specimen:  Rectum and sigmoid, distal L ureter   DISPOSITION OF SPECIMEN:  PATHOLOGY  COUNTS:  YES  PLAN OF CARE: Admit to inpatient   PATIENT DISPOSITION:  PACU - hemodynamically stable.  INDICATION:    50 y.o. female with a large rectal cancer. She is underwent neoadjuvant chemotherapy and radiation.  I recommended low anterior resection:  The anatomy & physiology of the digestive tract was discussed.  The pathophysiology was discussed.  Natural history risks without surgery was discussed.   I worked to give an overview of the disease and the frequent need to have multispecialty involvement.  I feel the risks of no intervention will lead to serious problems that outweigh the operative risks; therefore, I recommended a partial colectomy to remove the pathology.  Laparoscopic & open techniques were discussed.   Risks such as bleeding, infection, abscess, leak, reoperation, possible ostomy, hernia, heart attack, death, and other risks were discussed.  I noted a good  likelihood this will help address the problem.   Goals of post-operative recovery were discussed as well.    The patient expressed understanding & wished to proceed with surgery.  OR FINDINGS:   Patient had significant anterior scar tissue with obliteration of the rectovaginal septum  No obvious metastatic disease on visceral parietal peritoneum or liver.  The anastomosis rests 0 cm from the anal verge by rigid proctoscopy.  DESCRIPTION:   Informed consent was confirmed.  The patient underwent general anaesthesia without difficulty.  The patient was positioned appropriately.  VTE prevention in place.  The patient's abdomen was clipped, prepped, & draped in a sterile fashion.  Surgical timeout confirmed our plan.  The patient was positioned in reverse Trendelenburg.  Abdominal entry was gained using a varies needle in the left upper quadrant.  Entry was clean.  I induced carbon dioxide insufflation.   I placed a 8 mm robotic port in the right upper quadrant. Camera inspection revealed no injury. Extra ports were carefully placed under direct laparoscopic visualization.     I reflected the greater omentum and the upper abdomen the small bowel in the upper abdomen. I scored the base of peritoneum of the right side of the mesentery of the left colon from the ligament of Treitz to the peritoneal reflection of the mid rectum.    I elevated the sigmoid mesentery and enetered into the retro-mesenteric plane. We were able to identify the left ureter and gonadal vessels. I continued distally and got into the avascular plane posterior to the mesorectum. This allowed me to help mobilize the rectum as well by freeing the mesorectum off the sacrum.  I mobilized the peritoneal coverings towards the peritoneal reflection on both the right and left sides of the rectum. I continued down the posterior mesorectal plane using blunt dissection and electrocautery. This was carried down to the pelvic floor. I then  dissected out laterally. After this was completed I continued to divide the peritoneal reflection into the anterior plane. I had understood place a EEA sizer into the vagina. I still cannot see a rectovaginal plane. There was significant scar tissue in this area that obliterated this area. I divided what I thought was the plane using sharp dissection. Unfortunately I did make a small incision in the vagina. I then went posterior to this and was able to identify the rectovaginal plane. This was then sharply divided down to the level of the pelvic floor. I then divided the scar tissue out laterally using sharp dissection as well. After this was completed, I used a blue load robotic stapler 2 to transect at the coloanal junction I then mobilized the rectum out of the pelvis. It was at that point that I saw that there was a divided left ureter. The ureter was mobilized using blunt dissection. I consulted my urology colleagues who evaluated the situation.  During that time, I skeletonized the inferior mesenteric artery pedicle.  I went down to its takeoff from the aorta.   I isolated the inferior mesenteric vein off of the ligament of Treitz just cephalad to that as well.  After confirming the left ureter was out of the way, I went ahead and ligated the inferior mesenteric artery pedicle with bipolar vessel sealer ~2cm above its takeoff from the aorta.   I did ligate the inferior mesenteric vein in a similar fashion.  We ensured hemostasis.I mobilized the left colon in a lateral to medial fashion off the line of Toldt up towards the splenic flexure to ensure good mobilization of the left colon to reach into the pelvis.  We then docked the robot superiorly and the splenic flexure was taken down using the robotic vessel sealer. The omentum was also mobilized off of the left colon using the vessel sealer for an omental flap. Once this was completed, we had good length down to the anal canal.  I then made a Pfannenstiel  incision and carried this down to the fascia. The fascia was incised horizontally and the peritoneum was incised vertically. An [place] wound protector was placed. The specimen was removed. The remaining colon was transected and a purse stringer device was used to place a 2-0 Prolene pursestring. A 33 mm EEA stapler anvil was then placed into the distal colon and pursestring was tied tightly around this.  An anastomosis was then created from the anal canal to the colon using the EEA stapler. The stapler line was intact and hemostatic.  Dr. Louis Meckel then came in and performed a psoas hitch to reimplant the left ureter over a double-J stent. This is dictated separately.  After this was completed I used the robot to sew the omentum down to the rectovaginal septum using a 3-0 Vicryl v lock suture.  I then placed a 1 [place] Blake drain into the pelvis and brought this out through the right upper quadrant incision. This was secured into place with a 2-0 nylon suture. We then identified the terminal ileum and brought a loop of this out through the previously marked ostomy site where a 12 mm robotic port was already in place. A non-latex catheter was put underneath of the ileostomy and used  as an ostomy bar.  The extraction site posterior fascia was closed using a running 2-0 Vicryl suture  The anterior fascia was closed using interrupted 0 Novafil sutures. The subcutaneous tissue was reapproximated using 2-0 Vicryl sutures. The skin was closed using a running 4-0 Vicryl suture. A sterile dressing was applied. The remaining port sites were closed using 4-0 Vicryl suture. The ileostomy site was then matured using standard Brook technique and 2-0 Vicryl interrupted sutures. An ostomy appliance was placed. The patient was awakened from anesthesia and sent to the postanesthesia care unit stable condition. All counts were correct per operating room staff.

## 2016-02-26 NOTE — Anesthesia Postprocedure Evaluation (Signed)
Anesthesia Post Note  Patient: Destiny [name]  Procedure(s) Performed: Procedure(s) (LRB): XI ROBOTIC ASSISTED LOWER ANTERIOR RESECTION, diverting LOOP ILEOSTOMY, mobilization splenic flexure, omentopexy (N/A) left ureteral neocystotomy,double J stent placement, closure vaginal cuff (Left)  Patient location during evaluation: PACU Anesthesia Type: General Level of consciousness: awake and alert Pain management: pain level controlled Vital Signs Assessment: post-procedure vital signs reviewed and stable Respiratory status: spontaneous breathing, nonlabored ventilation, respiratory function stable and patient connected to nasal cannula oxygen Cardiovascular status: blood pressure returned to baseline and stable Postop Assessment: no signs of nausea or vomiting Anesthetic complications: no Comments: Pt has complaint of bilateral arm soreness. Motor and sensation intact. UE compartments are soft and compressible. Soreness likely secondary to positioning and arm restraints. Discomfort improving prior to discharge from PACU.     Last Vitals:  Vitals:   02/26/16 1645 02/26/16 1700  BP: (!) 155/75 (!) 151/75  Pulse: (!) 101 (!) 102  Resp: 15 14  Temp:  36.6 C    Last Pain:  Vitals:   02/26/16 1700  TempSrc:   PainSc: [place] Taisei Bonnette

## 2016-02-26 NOTE — H&P (Signed)
History of Present Illness  Patient words: Rectal CA finished [place].  The patient is a 50 year old female who presents with colorectal cancer. 50 year old female with several months of rectal bleeding who underwent a colonoscopy with Dr. Havery Moros. An ulcerated nonobstructing mass was found in the rectum approximately 2 cm from anal verge biopsies confirm adenocarcinoma. The mass was injected with tattoo proximally and laterally. CT scan of the chest abdomen and pelvis show no signs of metastatic disease. CEA was normal. MR of the pelvis for staging purposes shows a T3 N1 tumor consistent with stage III disease. She has now completed chemotherapy and radiation neoadjuvant lead. She has had minimal complications from this.   Problem List/Past Medical Leighton Ruff, MD; [id number] 10:18 AM) RECTAL CANCER (C20)  Other Problems Leighton Ruff, MD; [id number] 10:18 AM) Thyroid Disease Colon Cancer Depression High blood pressure Back Pain Anxiety Disorder  Past Surgical History Leighton Ruff, MD; [id number] 10:18 AM) Hysterectomy (not due to cancer) - Complete  Diagnostic Studies History Leighton Ruff, MD; [id number] 10:18 AM) Colonoscopy within last year Mammogram 1-3 years ago Pap Smear 1-5 years ago  Allergies Sander Nephew, CMA; 01/21/2016 9:39 AM) No Known Drug Allergies06/05/2016  Medication History Sander Nephew, CMA; 01/21/2016 9:40 AM) Tylenol (325MG  Capsule, Oral) Active. Methimazole (20MG  Tablet, Oral) Active. Ativan (1MG  Tablet, Oral) Active. Zoloft (50MG  Tablet, Oral) Active. Gabapentin (600MG  Tablet, Oral) Active. HydroCHLOROthiazide (12.5MG  Tablet, Oral) Active. Iron (324MG  Tablet, Oral) Active. Medications Reconciled  Social History Leighton Ruff, MD; [id number] 10:18 AM) Alcohol use Occasional alcohol use. Caffeine use Carbonated beverages, Coffee, Tea. Illicit drug use Prefer to discuss with provider. Tobacco use Never  smoker.  Family History Leighton Ruff, MD; [id number] 10:18 AM) Alcohol Abuse Brother. Arthritis Mother. Colon Cancer Family Members In General. Migraine Headache Sister. Thyroid problems Mother. Colon Polyps Family Members In General. Depression Sister. Hypertension Father, Mother.  Pregnancy / Birth History Leighton Ruff, MD; [id number] 10:18 AM) Maternal age 58-20 Para 61 Gravida 3 Age at menarche 71 years. Age of menopause 74-50    Review of Systems General Present- Fatigue, Night Sweats and Weight Gain. Not Present- Appetite Loss, Chills, Fever and Weight Loss. Skin Not Present- Change in Wart/Mole, Dryness, Hives, Jaundice, New Lesions, Non-Healing Wounds, Rash and Ulcer. HEENT Present- Seasonal Allergies and Wears glasses/contact lenses. Not Present- Earache, Hearing Loss, Hoarseness, Nose Bleed, Oral Ulcers, Ringing in the Ears, Sinus Pain, Sore Throat, Visual Disturbances and Yellow Eyes. Respiratory Not Present- Bloody sputum, Chronic Cough, Difficulty Breathing, Snoring and Wheezing. Cardiovascular Present- Leg Cramps. Not Present- Chest Pain, Difficulty Breathing Lying Down, Palpitations, Rapid Heart Rate, Shortness of Breath and Swelling of Extremities. Gastrointestinal Present- Bloody Stool, Change in Bowel Habits and Excessive gas. Not Present- Abdominal Pain, Bloating, Chronic diarrhea, Constipation, Difficulty Swallowing, Gets full quickly at meals, Hemorrhoids, Indigestion, Nausea, Rectal Pain and Vomiting. Female Genitourinary Not Present- Frequency, Nocturia, Painful Urination, Pelvic Pain and Urgency. Musculoskeletal Present- Back Pain. Not Present- Joint Pain, Joint Stiffness, Muscle Pain, Muscle Weakness and Swelling of Extremities. Neurological Present- Headaches and Tingling. Not Present- Decreased Memory, Fainting, Numbness, Seizures, Tremor, Trouble walking and Weakness. Psychiatric Present- Anxiety and Depression. Not Present- Bipolar, Change in  Sleep Pattern, Fearful and Frequent crying. Endocrine Present- Cold Intolerance, Hair Changes and Hot flashes. Not Present- Excessive Hunger, Heat Intolerance and New Diabetes. Hematology Present- Easy Bruising. Not Present- Excessive bleeding, Gland problems, HIV and Persistent Infections. BP 136/69   Pulse 99   Temp 98.1 F (36.7 C) (Oral)   Resp 18  Ht 5\' 2"  (1.575 m)   Wt 91.1 kg (200 lb 12 oz)   LMP  (LMP Unknown) Comment: perimenopausal  SpO2 100%   BMI 36.72 kg/m     Physical Exam  General Mental Status-Alert. General Appearance-Not in acute distress. Build & Nutrition-Well nourished. Posture-Normal posture. Gait-Normal.  Head and Neck Head-normocephalic, atraumatic with no lesions or palpable masses. Trachea-midline. Thyroid Gland Characteristics - normal size and consistency and no palpable nodules.  Chest and Lung Exam Chest and lung exam reveals -on auscultation, normal breath sounds, no adventitious sounds and normal vocal resonance.  Cardiovascular Cardiovascular examination reveals -normal heart sounds, regular rate and rhythm with no murmurs.  Abdomen Inspection Inspection of the abdomen reveals - No Hernias. Palpation/Percussion Palpation and Percussion of the abdomen reveal - Soft, Non Tender, No Rigidity (guarding), No hepatosplenomegaly and No Palpable abdominal masses.  Rectal Anorectal Exam Internal - Note: Posterior rectal mass extending to approximately 2 cm from the proximal anal verge.  Neurologic Neurologic evaluation reveals -alert and oriented x 3 with no impairment of recent or remote memory, normal attention span and ability to concentrate, normal sensation and normal coordination.  Musculoskeletal Normal Exam - Bilateral-Upper Extremity Strength Normal and Lower Extremity Strength Normal.    Assessment & Plan Leighton Ruff MD; [id number] 10:36 AM) RECTAL CANCER (C20) Impression: 50 year old female who  presents for surgical management of her rectal cancer. She has completed chemotherapy and radiation neoadjuvantly. She did not have much difficulty with this. We discussed her surgery in detail today. On physical exam she appears to have had some regression of her tumor. We discussed that there is a possibility that we could perform a primary anastomosis with diverting ostomy. If not we will plan on doing an abdominal perineal resection. I believe she understands this. She does not have any further questions currently. The surgery and anatomy were described to the patient as well as the risks of surgery and the possible complications. These include: Bleeding, deep abdominal infections and possible wound complications such as hernia and infection, damage to adjacent structures, leak of surgical connections, which can lead to other surgeries and possibly an ostomy, possible need for other procedures, such as abscess drains in radiology, possible prolonged hospital stay, possible diarrhea from removal of part of the colon, possible constipation from narcotics, possible bowel, bladder or sexual dysfunction if having rectal surgery, prolonged fatigue/weakness or appetite loss, possible early recurrence of of disease, possible complications of their medical problems such as heart disease or arrhythmias or lung problems, death (less than 1%). I believe the patient understands and wishes to proceed with the surgery.

## 2016-02-26 NOTE — Progress Notes (Signed)
Patient states went to beach and markings washed off from [facility] RN. " Tried to remember the markings and  remark it myself"  Page into wound Ostomy RN [phone]

## 2016-02-26 NOTE — Op Note (Signed)
Preoperative diagnosis:  1. Colorectal cancer   Postoperative diagnosis:  1. Same 2. Intraoperative left ureteral injury   Procedure: 1. Vaginal cuff closure 2. Left ureteroneocystostomy 3. Left double-J ureteral stent placement  Surgeon: Ardis Hughs, MD First assistant: Dr. Leighton Ruff, M.D., Dr. Bjorn Loser, M.D.  Anesthesia: General  Complications: None  Intraoperative findings:  The patient's left ureter was transected distally. The ureter and the distal segment was irregular appearing, and the 2.5 centimeter most distal aspect was sent to pathology included with the original rectal specimen.  The ureter was reimplanted into the left bladder dome. A 24x 6 French double-J ureteral stent was placed.  Drains: 24 cm x 6 French double-J left ureteral stent  EBL: Please see anesthesia's record for blood loss  Specimens: The distal aspect of the left ureter was included with the patient's original specimen. Please see Dr. Manon Hilding dictation for further details  Indication: Destiny [name] is a 50 y.o. patient with colorectal cancer. the patient had neoadjuvant chemotherapy and radiation. The combination of the neoadjuvant chemotherapy and radiation with prior pelvic surgeries made this surgery exceedingly challenging. Because of the poor visualization in the extensive adhesions the distal left ureter was transected.  Dr. Marcello Moores consult to me intraoperatively for further management. A decision was made to proceed with neoureteral cystotomy.   Description of procedure:  General anesthesia had been induced and the patient was positioned prior to my arrival. She been prepped and draped based on Dr. Manon Hilding specifications with robotic ports placed in the right abdomen projecting into the left lower pelvic region.  The first step that we took once I started the operation was to insert a catheter under sterile conditions that this could be filled and emptied on the field and a  sterile way. An 70 French Foley catheter was placed.  Dr. Matilde Sprang remained between the patient's legs during the case to help manipulate both the vagina and the Foley catheter. Dr. Marcello Moores was the bedside assistant and throughout the case exchanged the instruments on the robot and passed in and out suture facilitating the operation.  I then closed the vaginal cuff in 2 layers using a 2- V loc suture. The 2 holes noted were in the vaginal cuff. There were closed separately, and then a imbricating second layer was performed across both repairs.  I then had Dr. Matilde Sprang fill the patient's bladder with 200 mL of normal saline. The peritoneum was then incised and the bladder dissected out and dropped. The space of Retzius was developed. I then brought the ureter crossed the left side of the bladder and identified a area to create the ureteroneocystostomy. I placed 3 stay sutures, one inferiorly and 2 laterally. I then created the cystotomy which was approximately 5 mm. I then incised the ureter and spatulated it on the posterior aspect. The holding suture inferiorly was then sewn to the serosa more proximally along the ureter tethering the ureter to the bladder and allowing me to create a tension-free ureterovesical anastomosis. Using 3 oh Vicryls I placed 2 sutures at the crotch of the spatulated ureter and tied these to the most inferior aspect of the cystotomy. I then ran the back side stitch up to the apex of the incision.   We then passed a 0.038 sensor wire through the assistant port which I then advanced into the left ureter until I was unable to advance it further. I then advanced a 24 cm times 6 French double-J ureteral stent over the wire  and advanced it into the left ureter. Once the distal aspect of the stent was visualized I removed the wire and placed the curl of the stent within the bladder. I then sutured the front side of the uretero-vesicle anastomosis. The remaining apex of the cystotomy was  then closed with the remaining suture. I then imbricated the anastomosis with perivesical fat and the peritoneal reflection.  At this point the case was turned back over to Dr. Marcello Moores. I requested that Dr. Marcello Moores placed a Keenan Bachelor drain in the pelvis in the event that the ureteral reimplant has a small leak.    Ardis Hughs, M.D.

## 2016-02-26 NOTE — Anesthesia Procedure Notes (Signed)
Procedure Name: Intubation Date/Time: 02/26/2016 8:36 AM Performed by: Lollie Sails Pre-anesthesia Checklist: Patient identified, Emergency Drugs available, Suction available, Patient being monitored and Timeout performed Patient Re-evaluated:Patient Re-evaluated prior to inductionOxygen Delivery Method: Circle system utilized Preoxygenation: Pre-oxygenation with 100% oxygen Intubation Type: IV induction Ventilation: Mask ventilation without difficulty Laryngoscope Size: Miller and 3 Grade View: Grade I Tube type: Oral Tube size: 7.5 mm Number of attempts: 1 Airway Equipment and Method: Stylet Placement Confirmation: ETT inserted through vocal cords under direct vision,  positive ETCO2 and breath sounds checked- equal and bilateral Secured at: 22 cm Tube secured with: Tape Dental Injury: Teeth and Oropharynx as per pre-operative assessment

## 2016-02-26 NOTE — Anesthesia Preprocedure Evaluation (Addendum)
Anesthesia Evaluation  Patient identified by MRN, date of birth, ID band Patient awake    Reviewed: Allergy & Precautions, NPO status , Patient's Chart, lab work & pertinent test results  Airway Mallampati: II  TM Distance: >3 FB Neck ROM: Full    Dental  (+) Partial Lower, Dental Advisory Given,    Pulmonary neg pulmonary ROS,    breath sounds clear to auscultation       Cardiovascular hypertension, Pt. on medications  Rhythm:Regular Rate:Normal     Neuro/Psych PSYCHIATRIC DISORDERS Depression negative neurological ROS     GI/Hepatic negative GI ROS, Neg liver ROS,   Endo/Other  Hyperthyroidism   Renal/GU negative Renal ROS  negative genitourinary   Musculoskeletal negative musculoskeletal ROS (+)   Abdominal   Peds negative pediatric ROS (+)  Hematology negative hematology ROS (+)   Anesthesia Other Findings   Reproductive/Obstetrics negative OB ROS                             01/2016 EKG: normal sinus rhythm.  10/2015 Echo - Left ventricle: The cavity size was normal. Wall thickness was   normal. Systolic function was normal. The estimated ejection   fraction was in the range of 60% to 65%. Wall motion was normal;   there were no regional wall motion abnormalities. Mild diastolic   dysfunction. Normal filling pressures. - Aortic valve: Mildly calcified annulus. Trileaflet. - Mitral valve: Mildly thickened leaflets . There was mild   regurgitation. - Left atrium: The atrium was mildly dilated.  Anesthesia Physical  Anesthesia Plan  ASA: II  Anesthesia Plan: General   Post-op Pain Management:    Induction: Intravenous  Airway Management Planned: Oral ETT  Additional Equipment:   Intra-op Plan:   Post-operative Plan: Extubation in OR  Informed Consent: I have reviewed the patients History and Physical, chart, labs and discussed the procedure including the risks,  benefits and alternatives for the proposed anesthesia with the patient or authorized representative who has indicated his/her understanding and acceptance.   Dental advisory given  Plan Discussed with: CRNA  Anesthesia Plan Comments:         Anesthesia Quick Evaluation  

## 2016-02-27 ENCOUNTER — Encounter (HOSPITAL_COMMUNITY): Payer: Self-pay | Admitting: General Surgery

## 2016-02-27 LAB — BASIC METABOLIC PANEL
ANION GAP: 6 (ref 5–15)
BUN: 13 mg/dL (ref 6–20)
CHLORIDE: 106 mmol/L (ref 101–111)
CO2: 28 mmol/L (ref 22–32)
Calcium: 8.7 mg/dL — ABNORMAL LOW (ref 8.9–10.3)
Creatinine, Ser: 0.39 mg/dL — ABNORMAL LOW (ref 0.44–1.00)
GFR calc non Af Amer: >60 mL/min (ref >60)
Glucose, Bld: 138 mg/dL — ABNORMAL HIGH (ref 65–99)
Potassium: 4.3 mmol/L (ref 3.5–5.1)
Sodium: 140 mmol/L (ref 135–145)

## 2016-02-27 LAB — CBC
HEMATOCRIT: 28.6 % — AB (ref 36.0–46.0)
HEMOGLOBIN: 10 g/dL — AB (ref 12.0–15.0)
MCH: 30 pg (ref 26.0–34.0)
MCHC: 35 g/dL (ref 30.0–36.0)
MCV: 85.9 fL (ref 78.0–100.0)
Platelets: 282 10*3/uL (ref 150–400)
RBC: 3.33 MIL/uL — AB (ref 3.87–5.11)
RDW: 13.6 % (ref 11.5–15.5)
WBC: 7.9 10*3/uL (ref 4.0–10.5)

## 2016-02-27 MED ORDER — PROMETHAZINE HCL 25 MG/ML IJ SOLN
12.5000 mg | Freq: Four times a day (QID) | INTRAMUSCULAR | Status: DC | PRN
  Administered 2016-02-27 – 2016-02-28 (×3): 12.5 mg via INTRAVENOUS
  Filled 2016-02-27 (×4): qty 1

## 2016-02-27 NOTE — Progress Notes (Signed)
Urology Inpatient Progress Report  rectal cancer  Procedure(s): XI ROBOTIC ASSISTED LOWER ANTERIOR RESECTION, diverting LOOP ILEOSTOMY, mobilization splenic flexure, omentopexy left ureteral neocystotomy,double J stent placement, closure vaginal cuff  1 Day Post-Op   Intv/Subj: No acute events overnight. Patient stood last night at bedside C/o abdominal pain No nausea/vomitting  Active Problems:   Rectal cancer (HCC)  Current Facility-Administered Medications  Medication Dose Route Frequency Provider Last Rate Last Dose  . acetaminophen (TYLENOL) tablet 1,000 mg  [id number] mg Oral [id number] Leighton Ruff, MD   [id number] mg at 02/27/16 0049  . alvimopan (ENTEREG) capsule 12 mg  12 mg Oral BID Leighton Ruff, MD      . dextrose 5 % and 0.45 % NaCl with KCl 20 mEq/L infusion   Intravenous Continuous Leighton Ruff, MD [id number] mL/hr at 02/27/16 0352 100 mL/hr at 02/27/16 0352  . diclofenac (VOLTAREN) EC tablet 75 mg  75 mg Oral BID Leighton Ruff, MD      . diphenhydrAMINE (BENADRYL) capsule 25 mg  25 mg Oral [id number] PRN Leighton Ruff, MD       Or  . diphenhydrAMINE (BENADRYL) injection 25 mg  25 mg Intravenous [id number] PRN Leighton Ruff, MD      . enoxaparin (LOVENOX) injection 40 mg  40 mg Subcutaneous [id number] Leighton Ruff, MD      . gabapentin (NEURONTIN) capsule 300 mg  [id number] mg Oral QHS Leighton Ruff, MD      . hydrochlorothiazide (HYDRODIURIL) tablet 12.5 mg  [id number] mg Oral Daily Leighton Ruff, MD      . HYDROmorphone (DILAUDID) injection 0.5-1 mg  0.5-1 mg Intravenous [id number] PRN Leighton Ruff, MD   1 mg at 02/27/16 0746  . LORazepam (ATIVAN) tablet 0.5 mg  0.5 mg Oral QHS Leighton Ruff, MD   0.5 mg at 02/26/16 2155  . methocarbamol (ROBAXIN) tablet 500 mg  500 mg Oral [id number] PRN Leighton Ruff, MD      . ondansetron [facility]) tablet 4 mg  4 mg Oral [id number] PRN Leighton Ruff, MD       Or  . ondansetron [facility]) injection 4 mg  4 mg Intravenous [id number] PRN Leighton Ruff, MD      . sertraline (ZOLOFT) tablet 50 mg  50 mg Oral  Daily Leighton Ruff, MD         Objective: Vital: Vitals:   02/26/16 1820 02/26/16 2045 02/27/16 0124 02/27/16 0526  BP: (!) 144/70 (!) 148/72 (!) 124/58 (!) 117/57  Pulse: (!) 102 100 (!) 110 (!) 109  Resp: 18 16 16 16   Temp: 97.9 F (36.6 C) 98.3 F (36.8 C) 98.7 F (37.1 C) 98.6 F (37 C)  TempSrc: Oral Oral Oral Oral  SpO2: 99% 99% 100% 98%  Weight:      Height:       I/Os: I/O last 3 completed shifts: In: 3500 [I.V.:3400; IV Piggyback:100] Out: 1010 [Urine:700; Drains:150; Stool:10; Blood:150]  Physical Exam:  General: Patient is in no apparent distress Lungs: Normal respiratory effort, chest expands symmetrically. GI: Incisions are c/d/i. The abdomen is soft and nontender without mass. Stoma pink.   JP drain with serosanguinous drainage Foley: straw colored  Ext: lower extremities symmetric  Lab Results:  Recent Labs  02/27/16 0538  WBC 7.9  HGB 10.0*  HCT 28.6*    Recent Labs  02/27/16 0538  NA 140  K 4.3  CL 106  CO2 28  GLUCOSE 138*  BUN 13  CREATININE 0.39*  CALCIUM 8.7*   No results for  input(s): LABPT, INR in the last 72 hours. No results for input(s): LABURIN in the last 72 hours. Results for orders placed or performed in visit on 02/06/16  HPV DNA Probe (High Risk)     Status: None   Collection Time: 02/06/16  3:11 PM  Result Value Ref Range Status   HPV, high-risk Negative Negative Final    Comment: This high-risk HPV test detects thirteen high-risk types (16/18/31/33/35/39/45/51/52/56/58/59/68) without differentiation.     Studies/Results: No results found.  Assessment: Procedure(s): XI ROBOTIC ASSISTED LOWER ANTERIOR RESECTION, diverting LOOP ILEOSTOMY, mobilization splenic flexure, omentopexy left ureteral neocystotomy,double J stent placement, closure vaginal cuff, 1 Day Post-Op  doing well.  Plan: Routine post-op care. Send JP for creatinine prior to discontinuing it. Foley will stay for 7-10 days - she'll need a  cystogram prior to removal.   I have scheduled her for voiding trial in the office. Will continue to follow.  Louis Meckel, MD Urology 02/27/2016, 7:48 AM

## 2016-02-27 NOTE — Consult Note (Addendum)
[place] Nurse ostomy consult note Stoma type/location: RLQ ileostomy with clear plastic bridge in place. Stomal assessment/size: 1 and 5/8 inch slightly oval, red, moist, budded, but in a deep parastomal gulley Peristomal assessment: intact Treatment options for stomal/peristomal skin: two skin barrier rings Output: serosanguinous effluent, 150 mls emptied with this pouch change Ostomy pouching: 1pc.convex pouching system with 2 skin barrier rings used today.  Thoughts are to consider an ostomy appliance belt as an initial intervention should this pouching system seal become compromised, next attempts may be to use a flat pouching system with the skin barrier rings. Education provided: Patient weeps through pouch change today, states she is not uncomfortable, nods in affirmation when asked by her niece if she is upset about having a stoma. Assured that her response is not entirely unusual and that the [facility] nursing staff will be working with her to assist her in gaining comfort and acceptance of her ostomy.  Niece is very supportive and accepting of ostomy, takes educational booklet. Enrolled patient in [place] program: Not at this time, it will be important to determine which pouching system is likely to be successful prior to ordering samples. [place] nursing team will not follow, but will remain available to this patient, the nursing and medical teams.  Please re-consult if needed. Thanks, Maudie Flakes, MSN, RN, [employer], [facility]  Pager# [phone]

## 2016-02-27 NOTE — Progress Notes (Signed)
1 Day Post-Op robotic LAR with coloanal anastomosis, diverting loop ileostomy and bladder hitch ureteral reimplantation. Subjective: Pain as expected, no nausea, OOB last night  Objective: Vital signs in last 24 hours: Temp:  [97.9 F (36.6 C)-99 F (37.2 C)] 98.6 F (37 C) (09/28 0526) Pulse Rate:  [100-110] 109 (09/28 0526) Resp:  [14-19] 16 (09/28 0526) BP: (117-156)/(57-87) 117/57 (09/28 0526) SpO2:  [98 %-100 %] 98 % (09/28 0526)   Intake/Output from previous day: 09/27 0701 - 09/28 0700 In: 3500 [I.V.:3400; IV Piggyback:100] Out: 1010 [Urine:700; Drains:150; Stool:10; Blood:150] Intake/Output this shift: No intake/output data recorded.   General appearance: alert and cooperative GI: soft, non-distended JP: SS fluid Ileostomy: beefy red Incision: no significant drainage  Lab Results:   Recent Labs  02/27/16 0538  WBC 7.9  HGB 10.0*  HCT 28.6*  PLT 282   BMET  Recent Labs  02/27/16 0538  NA 140  K 4.3  CL 106  CO2 28  GLUCOSE 138*  BUN 13  CREATININE 0.39*  CALCIUM 8.7*   PT/INR No results for input(s): LABPROT, INR in the last 72 hours. ABG No results for input(s): PHART, HCO3 in the last 72 hours.  Invalid input(s): PCO2, PO2  MEDS, Scheduled . acetaminophen  1,000 mg Oral Q6H  . alvimopan  12 mg Oral BID  . diclofenac  75 mg Oral BID  . enoxaparin (LOVENOX) injection  40 mg Subcutaneous Q24H  . gabapentin  300 mg Oral QHS  . hydrochlorothiazide  12.5 mg Oral Daily  . LORazepam  0.5 mg Oral QHS  . sertraline  50 mg Oral Daily    Studies/Results: No results found.  Assessment: s/p Procedure(s): XI ROBOTIC ASSISTED LOWER ANTERIOR RESECTION, diverting LOOP ILEOSTOMY, mobilization splenic flexure, omentopexy left ureteral neocystotomy,double J stent placement, closure vaginal cuff Patient Active Problem List   Diagnosis Date Noted  . Rectal cancer ([place]) 02/26/2016  . Malignant neoplasm of rectum ([place]) 11/17/2015  . Anemia 10/01/2015   . Essential hypertension, benign 09/02/2015  . Obesity 09/02/2015  . Depression 05/02/2015  . Hyperthyroidism 02/27/2015  . Closed fracture of nasal bone 02/18/2015    Expected post op course  Plan: Do not remove foley  Advance diet to clears Cont MIV Ambulate   LOS: 1 day     .Rosario Adie, [facility], [place] [phone]   02/27/2016 8:35 AM

## 2016-02-28 LAB — CBC
HEMATOCRIT: 32.2 % — AB (ref 36.0–46.0)
HEMOGLOBIN: 11.1 g/dL — AB (ref 12.0–15.0)
MCH: 30.3 pg (ref 26.0–34.0)
MCHC: 34.5 g/dL (ref 30.0–36.0)
MCV: 88 fL (ref 78.0–100.0)
Platelets: 290 10*3/uL (ref 150–400)
RBC: 3.66 MIL/uL — ABNORMAL LOW (ref 3.87–5.11)
RDW: 13.3 % (ref 11.5–15.5)
WBC: 9.2 10*3/uL (ref 4.0–10.5)

## 2016-02-28 LAB — BASIC METABOLIC PANEL
ANION GAP: 6 (ref 5–15)
BUN: 8 mg/dL (ref 6–20)
CHLORIDE: 102 mmol/L (ref 101–111)
CO2: 30 mmol/L (ref 22–32)
Calcium: 8.8 mg/dL — ABNORMAL LOW (ref 8.9–10.3)
Creatinine, Ser: 0.47 mg/dL (ref 0.44–1.00)
GFR calc Af Amer: >60 mL/min (ref >60)
GLUCOSE: 187 mg/dL — AB (ref 65–99)
POTASSIUM: 4.9 mmol/L (ref 3.5–5.1)
Sodium: 138 mmol/L (ref 135–145)

## 2016-02-28 MED ORDER — HYDROMORPHONE HCL 1 MG/ML IJ SOLN
1.0000 mg | INTRAMUSCULAR | Status: DC | PRN
  Administered 2016-02-28 – 2016-03-05 (×30): 1 mg via INTRAVENOUS
  Filled 2016-02-28 (×31): qty 1

## 2016-02-28 MED ORDER — FAMOTIDINE IN NACL 20-0.9 MG/50ML-% IV SOLN
20.0000 mg | Freq: Two times a day (BID) | INTRAVENOUS | Status: DC | PRN
  Administered 2016-02-28 – 2016-03-02 (×6): 20 mg via INTRAVENOUS
  Filled 2016-02-28 (×6): qty 50

## 2016-02-28 NOTE — Progress Notes (Signed)
Patient requests to be switched back to clear liquid diet. Patient states she has had no nausea since this morning. MD paged.

## 2016-02-28 NOTE — Progress Notes (Signed)
2 Days Post-Op robotic LAR with coloanal anastomosis, diverting loop ileostomy and bladder hitch ureteral reimplantation. Subjective: Pain as expected, Pt's family states she vomiting last night.  Feels bloated and belching a lot  Objective: Vital signs in last 24 hours: Temp:  [98 F (36.7 C)-99.5 F (37.5 C)] 98 F (36.7 C) (09/29 0554) Pulse Rate:  [97-106] 106 (09/29 0554) Resp:  [16-18] 16 (09/29 0554) BP: (129-143)/(59-80) 131/77 (09/29 0554) SpO2:  [100 %] 100 % (09/29 0554)   Intake/Output from previous day: 09/28 0701 - 09/29 0700 In: 3721.7 [P.O.:460; I.V.:3261.7] Out: 2995 [Urine:2450; Drains:270; Stool:275] Intake/Output this shift: No intake/output data recorded.   General appearance: alert and cooperative GI: soft, non-distended JP: SS fluid Ileostomy: beefy red, bilious output Incision: no significant drainage  Lab Results:   Recent Labs  02/27/16 0538 02/28/16 0549  WBC 7.9 9.2  HGB 10.0* 11.1*  HCT 28.6* 32.2*  PLT 282 290   BMET  Recent Labs  02/27/16 0538 02/28/16 0549  NA 140 138  K 4.3 4.9  CL 106 102  CO2 28 30  GLUCOSE 138* 187*  BUN 13 8  CREATININE 0.39* 0.47  CALCIUM 8.7* 8.8*   PT/INR No results for input(s): LABPROT, INR in the last 72 hours. ABG No results for input(s): PHART, HCO3 in the last 72 hours.  Invalid input(s): PCO2, PO2  MEDS, Scheduled . alvimopan  12 mg Oral BID  . diclofenac  75 mg Oral BID  . enoxaparin (LOVENOX) injection  40 mg Subcutaneous Q24H  . gabapentin  300 mg Oral QHS  . hydrochlorothiazide  12.5 mg Oral Daily  . LORazepam  0.5 mg Oral QHS  . sertraline  50 mg Oral Daily    Studies/Results: No results found.  Assessment: s/p Procedure(s): XI ROBOTIC ASSISTED LOWER ANTERIOR RESECTION, diverting LOOP ILEOSTOMY, mobilization splenic flexure, omentopexy left ureteral neocystotomy,double J stent placement, closure vaginal cuff Patient Active Problem List   Diagnosis Date Noted  . Rectal  cancer ([place]) 02/26/2016  . Malignant neoplasm of rectum ([place]) 11/17/2015  . Anemia 10/01/2015  . Essential hypertension, benign 09/02/2015  . Obesity 09/02/2015  . Depression 05/02/2015  . Hyperthyroidism 02/27/2015  . Closed fracture of nasal bone 02/18/2015    Expected post op course  Plan: Do not remove foley  NPO today until nausea resolves Cont MIV Ambulate   LOS: 2 days     .Rosario Adie, [facility], [place]   02/28/2016 8:25 AM

## 2016-02-29 LAB — BASIC METABOLIC PANEL
ANION GAP: 7 (ref 5–15)
BUN: 12 mg/dL (ref 6–20)
CHLORIDE: 102 mmol/L (ref 101–111)
CO2: 29 mmol/L (ref 22–32)
Calcium: 9 mg/dL (ref 8.9–10.3)
Creatinine, Ser: 0.45 mg/dL (ref 0.44–1.00)
GFR calc non Af Amer: >60 mL/min (ref >60)
GLUCOSE: 142 mg/dL — AB (ref 65–99)
POTASSIUM: 4.5 mmol/L (ref 3.5–5.1)
Sodium: 138 mmol/L (ref 135–145)

## 2016-02-29 LAB — CBC
HEMATOCRIT: 29.4 % — AB (ref 36.0–46.0)
HEMOGLOBIN: 10.3 g/dL — AB (ref 12.0–15.0)
MCH: 29.9 pg (ref 26.0–34.0)
MCHC: 35 g/dL (ref 30.0–36.0)
MCV: 85.5 fL (ref 78.0–100.0)
Platelets: 285 10*3/uL (ref 150–400)
RBC: 3.44 MIL/uL — AB (ref 3.87–5.11)
RDW: 13.1 % (ref 11.5–15.5)
WBC: 5.6 10*3/uL (ref 4.0–10.5)

## 2016-02-29 NOTE — Progress Notes (Signed)
Patient ID: Destiny [name], female   DOB: [birth year], 50 y.o.   MRN: [id number] [facility] Progress Note:   3 Days Post-Op  Subjective: Mental status is clear.   Objective: Vital signs in last 24 hours: Temp:  [98.1 F (36.7 C)-98.9 F (37.2 C)] 98.1 F (36.7 C) (09/30 0525) Pulse Rate:  [89-102] 89 (09/30 0525) Resp:  [15-16] 16 (09/30 0525) BP: (107-132)/(53-62) 132/62 (09/30 0525) SpO2:  [96 %-100 %] 97 % (09/30 0525)  Intake/Output from previous day: 09/29 0701 - 09/30 0700 In: 1250 [I.V.:1200; IV Piggyback:50] Out: 3480 [Urine:2325; Drains:455; Stool:700] Intake/Output this shift: No intake/output data recorded.  Physical Exam: Work of breathing is not labored.  Ostomy with thin liquid brown output.  JP serosanguinous.   Lab Results:  Results for orders placed or performed during the hospital encounter of 02/26/16 (from the past 48 hour(s))  Basic metabolic panel     Status: Abnormal   Collection Time: 02/28/16  5:49 AM  Result Value Ref Range   Sodium 138 135 - 145 mmol/L   Potassium 4.9 3.5 - 5.1 mmol/L   Chloride 102 101 - 111 mmol/L   CO2 30 22 - 32 mmol/L   Glucose, Bld 187 (H) 65 - 99 mg/dL   BUN 8 6 - 20 mg/dL   Creatinine, Ser 0.47 0.44 - 1.00 mg/dL   Calcium 8.8 (L) 8.9 - 10.3 mg/dL   GFR calc non Af Amer >60 >60 mL/min   GFR calc Af Amer >60 >60 mL/min    Comment: (NOTE) The eGFR has been calculated using the CKD EPI equation. This calculation has not been validated in all clinical situations. eGFR's persistently <60 mL/min signify possible Chronic Kidney Disease.    Anion gap 6 5 - 15  CBC     Status: Abnormal   Collection Time: 02/28/16  5:49 AM  Result Value Ref Range   WBC 9.2 4.0 - 10.5 K/uL   RBC 3.66 (L) 3.87 - 5.11 MIL/uL   Hemoglobin 11.1 (L) 12.0 - 15.0 g/dL   HCT 32.2 (L) 36.0 - 46.0 %   MCV 88.0 78.0 - 100.0 fL   MCH 30.3 26.0 - 34.0 pg   MCHC 34.5 30.0 - 36.0 g/dL   RDW 13.3 11.5 - 15.5 %   Platelets 290 150 - 400 K/uL   Basic metabolic panel     Status: Abnormal   Collection Time: 02/29/16  6:05 AM  Result Value Ref Range   Sodium 138 135 - 145 mmol/L   Potassium 4.5 3.5 - 5.1 mmol/L   Chloride 102 101 - 111 mmol/L   CO2 29 22 - 32 mmol/L   Glucose, Bld 142 (H) 65 - 99 mg/dL   BUN 12 6 - 20 mg/dL   Creatinine, Ser 0.45 0.44 - 1.00 mg/dL   Calcium 9.0 8.9 - 10.3 mg/dL   GFR calc non Af Amer >60 >60 mL/min   GFR calc Af Amer >60 >60 mL/min    Comment: (NOTE) The eGFR has been calculated using the CKD EPI equation. This calculation has not been validated in all clinical situations. eGFR's persistently <60 mL/min signify possible Chronic Kidney Disease.    Anion gap 7 5 - 15  CBC     Status: Abnormal   Collection Time: 02/29/16  6:05 AM  Result Value Ref Range   WBC 5.6 4.0 - 10.5 K/uL   RBC 3.44 (L) 3.87 - 5.11 MIL/uL   Hemoglobin 10.3 (L) 12.0 - 15.0 g/dL  HCT 29.4 (L) 36.0 - 46.0 %   MCV 85.5 78.0 - 100.0 fL   MCH 29.9 26.0 - 34.0 pg   MCHC 35.0 30.0 - 36.0 g/dL   RDW 13.1 11.5 - 15.5 %   Platelets 285 150 - 400 K/uL    Radiology/Results: No results found.  Anti-infectives: Anti-infectives    Start     Dose/Rate Route Frequency Ordered Stop   02/27/16 0200  cefoTEtan (CEFOTAN) 2 g in dextrose 5 % 50 mL IVPB     2 g 100 mL/hr over 30 Minutes Intravenous Every 12 hours 02/26/16 1730 02/27/16 0134   02/26/16 0639  cefoTEtan (CEFOTAN) 2 g in dextrose 5 % 50 mL IVPB     2 g 100 mL/hr over 30 Minutes Intravenous On call to O.R. 02/26/16 0639 02/26/16 1430      Assessment/Plan: Problem List: Patient Active Problem List   Diagnosis Date Noted  . Rectal cancer ([place]) 02/26/2016  . Malignant neoplasm of rectum ([place]) 11/17/2015  . Anemia 10/01/2015  . Essential hypertension, benign 09/02/2015  . Obesity 09/02/2015  . Depression 05/02/2015  . Hyperthyroidism 02/27/2015  . Closed fracture of nasal bone 02/18/2015    Tolerating clears.  Will advance to soft diet 3 Days Post-Op     LOS: 3 days   Matt B. Hassell Done, MD, [facility] [phone] beeper [phone]  02/29/2016 10:19 AM

## 2016-03-01 NOTE — Progress Notes (Signed)
General Surgery [facility]  03/01/2016  Assessment & Plan: POD#4 Status post robotic low anterior resection rectal carcinoma, ileostomy, ureteral reimplantation  Taking limited soft diet  Needs to get OOB, ambulate - encouraged activity with nurses, family         Destiny Regal, MD, [facility]       Office: [phone]    Subjective: Patient in bed, complains of pain and heartburn symptoms.  Family at bedside.  Objective: Vital signs in last 24 hours: Temp:  [97.1 F (36.2 C)-98.6 F (37 C)] 98.2 F (36.8 C) (10/01 0655) Pulse Rate:  [92-96] 96 (10/01 0655) Resp:  [18-19] 19 (10/01 0655) BP: (96-134)/(56-77) 134/77 (10/01 0655) SpO2:  [97 %-100 %] 100 % (10/01 0655) Last BM Date: 02/28/16 (emptied today)  Intake/Output from previous day: 09/30 0701 - 10/01 0700 In: 500 [P.O.:500] Out: 4163 [Urine:3500; Drains:163; Stool:500] Intake/Output this shift: No intake/output data recorded.  Physical Exam: HEENT - sclerae clear, mucous membranes moist Neck - soft Chest - clear bilaterally Cor - RRR Abdomen - soft, obese; BS present; thin liquid in ostomy; ostomy viable with bridge in place; JP drain with thin serous output; incisions clean and dry and intact  Lab Results:   Recent Labs  02/28/16 0549 02/29/16 0605  WBC 9.2 5.6  HGB 11.1* 10.3*  HCT 32.2* 29.4*  PLT 290 285   BMET  Recent Labs  02/28/16 0549 02/29/16 0605  NA 138 138  K 4.9 4.5  CL 102 102  CO2 30 29  GLUCOSE 187* 142*  BUN 8 12  CREATININE 0.47 0.45  CALCIUM 8.8* 9.0   PT/INR No results for input(s): LABPROT, INR in the last 72 hours. Comprehensive Metabolic Panel:    Component Value Date/Time   NA 138 02/29/2016 0605   NA 138 02/28/2016 0549   NA 140 01/07/2016 1421   NA 137 12/24/2015 1433   K 4.5 02/29/2016 0605   K 4.9 02/28/2016 0549   K 4.1 01/07/2016 1421   K 4.3 12/24/2015 1433   CL 102 02/29/2016 0605   CL 102  02/28/2016 0549   CO2 29 02/29/2016 0605   CO2 30 02/28/2016 0549   CO2 27 01/07/2016 1421   CO2 27 12/24/2015 1433   BUN 12 02/29/2016 0605   BUN 8 02/28/2016 0549   BUN 11.3 01/07/2016 1421   BUN 12.5 12/24/2015 1433   CREATININE 0.45 02/29/2016 0605   CREATININE 0.47 02/28/2016 0549   CREATININE 0.8 01/07/2016 1421   CREATININE 0.8 12/24/2015 1433   GLUCOSE 142 (H) 02/29/2016 0605   GLUCOSE 187 (H) 02/28/2016 0549   GLUCOSE 101 01/07/2016 1421   GLUCOSE 120 12/24/2015 1433   CALCIUM 9.0 02/29/2016 0605   CALCIUM 8.8 (L) 02/28/2016 0549   CALCIUM 8.8 01/07/2016 1421   CALCIUM 8.9 12/24/2015 1433   AST 12 01/07/2016 1421   AST 12 12/24/2015 1433   ALT 12 01/07/2016 1421   ALT 10 12/24/2015 1433   ALKPHOS 132 01/07/2016 1421   ALKPHOS 133 12/24/2015 1433   BILITOT 0.55 01/07/2016 1421   BILITOT 0.65 12/24/2015 1433   PROT 7.1 01/07/2016 1421   PROT 7.3 12/24/2015 1433   ALBUMIN 3.6 01/07/2016 1421   ALBUMIN 3.7 12/24/2015 1433    Studies/Results: No results found.    Destiny [name] M 03/01/2016  Patient ID: Destiny [name], female   DOB: August 02, 1965, 50 y.o.   MRN: [id number]

## 2016-03-02 LAB — CREATININE, FLUID (PLEURAL, PERITONEAL, JP DRAINAGE): Creat, Fluid: 0.6 mg/dL

## 2016-03-02 MED ORDER — LOPERAMIDE HCL 2 MG PO CAPS
4.0000 mg | ORAL_CAPSULE | Freq: Three times a day (TID) | ORAL | Status: DC
  Administered 2016-03-02 – 2016-03-03 (×4): 4 mg via ORAL
  Filled 2016-03-02 (×4): qty 2

## 2016-03-02 NOTE — Consult Note (Signed)
[place] Nurse ostomy follow up Stoma type/location: RLQ ileostomy Stomal assessment/size: 1 and 1/2 inch, pouch cut to 1 and 3/4 inch (slightly oval) with bridge intact. Peristomal assessment: intact, clear. Treatment options for stomal/peristomal : Thin, dark brown effluent. Ostomy pouching: 1pc.flat pouch cut wide used with skin barrier ring Education provided: Patient and mother participating in ostomy teaching session today. Demonstration of pouch removal, ostomy sizing, review of ostomy bridge, demonstration of pouch preparation including tracing and cutting out of pouch. Patient view ostomy several times today (she did not on Friday), but still closes eyes and cries three times during procedure. She is able to demonstrate opening of the tail closure and performance of the Lock and Roll closure feature of the pouch independently.  Mother is not participating in care.  Two educatinal booklets brought to room, booklet provided Friday cannot be located.   Supplies left in room. Patient lives with daughter who is not at home for 12 hours each day. Patient must be able to empty pouch independently prior to discharge and will require the assistance of a HHRN until she is independent with changing the pouch.  If you agree, please order. Enrolled patient in [place] Start Discharge program: Yes [facility] nursing team will follow, and will remain available to this patient, the nursing, surgical and medical teams.   Thanks, Maudie Flakes, MSN, RN, [place], [facility]  Pager# [phone]

## 2016-03-02 NOTE — Progress Notes (Signed)
Discharge planning, spoke with patient and mother at beside. Chose [facility] for [facility] services, contacted [facility] for referral. Awaiting final [place] orders from attending. [phone]

## 2016-03-02 NOTE — Progress Notes (Signed)
5 Days Post-Op robotic LAR with coloanal anastomosis, diverting loop ileostomy and bladder hitch ureteral reimplantation. Subjective: Pain better, Ambulating well, has minimal appettite.    Objective: Vital signs in last 24 hours: Temp:  [97.8 F (36.6 C)-98.2 F (36.8 C)] 98.2 F (36.8 C) (10/02 0602) Pulse Rate:  [84-101] 95 (10/02 0602) Resp:  [16-18] 16 (10/02 0602) BP: (116-131)/(56-65) 131/60 (10/02 0602) SpO2:  [97 %-99 %] 99 % (10/02 0602)   Intake/Output from previous day: 10/01 0701 - 10/02 0700 In: 120 [P.O.:120] Out: 4085 [Urine:2475; Drains:160; [id number] Intake/Output this shift: No intake/output data recorded.   General appearance: alert and cooperative GI: soft, non-distended JP: SS fluid Ileostomy: beefy red, bilious output Incision: no significant drainage  Lab Results:   Recent Labs  02/29/16 0605  WBC 5.6  HGB 10.3*  HCT 29.4*  PLT 285   BMET  Recent Labs  02/29/16 0605  NA 138  K 4.5  CL 102  CO2 29  GLUCOSE 142*  BUN 12  CREATININE 0.45  CALCIUM 9.0   PT/INR No results for input(s): LABPROT, INR in the last 72 hours. ABG No results for input(s): PHART, HCO3 in the last 72 hours.  Invalid input(s): PCO2, PO2  MEDS, Scheduled . diclofenac  75 mg Oral BID  . enoxaparin (LOVENOX) injection  40 mg Subcutaneous Q24H  . gabapentin  300 mg Oral QHS  . hydrochlorothiazide  12.5 mg Oral Daily  . loperamide  4 mg Oral TID  . LORazepam  0.5 mg Oral QHS  . sertraline  50 mg Oral Daily    Studies/Results: No results found.  Assessment: s/p Procedure(s): XI ROBOTIC ASSISTED LOWER ANTERIOR RESECTION, diverting LOOP ILEOSTOMY, mobilization splenic flexure, omentopexy left ureteral neocystotomy,double J stent placement, closure vaginal cuff Patient Active Problem List   Diagnosis Date Noted  . Rectal cancer ([place]) 02/26/2016  . Malignant neoplasm of rectum ([place]) 11/17/2015  . Anemia 10/01/2015  . Essential hypertension, benign  09/02/2015  . Obesity 09/02/2015  . Depression 05/02/2015  . Hyperthyroidism 02/27/2015  . Closed fracture of nasal bone 02/18/2015    Expected post op course  Plan: Do not remove foley  Cont soft diet Decrease MIV Imodium to slow ostomy output Encourage fluid intake and ambulation Send JP for Cr   LOS: 5 days     .Rosario Adie, [facility], [place]   03/02/2016 8:41 AM

## 2016-03-03 MED ORDER — LOPERAMIDE HCL 2 MG PO CAPS
2.0000 mg | ORAL_CAPSULE | Freq: Three times a day (TID) | ORAL | Status: DC
  Administered 2016-03-03 – 2016-03-04 (×3): 2 mg via ORAL
  Filled 2016-03-03 (×3): qty 1

## 2016-03-03 MED ORDER — SODIUM CHLORIDE 0.9% FLUSH: 3.0000 mL | INTRAVENOUS | Status: DC | PRN

## 2016-03-03 MED ORDER — SODIUM CHLORIDE 0.9% FLUSH
3.0000 mL | Freq: Two times a day (BID) | INTRAVENOUS | Status: DC
  Administered 2016-03-03 – 2016-03-04 (×4): 3 mL via INTRAVENOUS

## 2016-03-03 MED ORDER — FAMOTIDINE 20 MG PO TABS
20.0000 mg | ORAL_TABLET | Freq: Two times a day (BID) | ORAL | Status: DC | PRN
  Administered 2016-03-03 – 2016-03-04 (×2): 20 mg via ORAL
  Filled 2016-03-03 (×2): qty 1

## 2016-03-03 NOTE — Progress Notes (Signed)
Per [place] they are not allowed to d/c Jp drain, I tried to call [facility] charge nurse to d/c JP drain,nurse tied up,will try to call back. Will endorsed to 3[facility]7p nurse.

## 2016-03-03 NOTE — Progress Notes (Signed)
I spoke to [place] nurse to d/c Jp drain. Will endorsed.

## 2016-03-03 NOTE — Progress Notes (Signed)
Urology Inpatient Progress Report  rectal cancer  Procedure(s): XI ROBOTIC ASSISTED LOWER ANTERIOR RESECTION, diverting LOOP ILEOSTOMY, mobilization splenic flexure, omentopexy left ureteral neocystotomy,double J stent placement, closure vaginal cuff  6 Days Post-Op   Intv/Subj: No acute events overnight. Sleepy this AM. Pain reasonably well controlled.  Active Problems:   Rectal cancer ([place])  Current Facility-Administered Medications  Medication Dose Route Frequency Provider Last Rate Last Dose  . dextrose 5 % and 0.45 % NaCl with KCl 20 mEq/L infusion   Intravenous Continuous Leighton Ruff, MD 50 mL/hr at 03/02/16 1011    . diclofenac (VOLTAREN) EC tablet 75 mg  75 mg Oral BID Leighton Ruff, MD   75 mg at 03/02/16 2129  . diphenhydrAMINE (BENADRYL) capsule 25 mg  25 mg Oral [id number] PRN Leighton Ruff, MD       Or  . diphenhydrAMINE (BENADRYL) injection 25 mg  25 mg Intravenous [id number] PRN Leighton Ruff, MD      . enoxaparin (LOVENOX) injection 40 mg  40 mg Subcutaneous [id number] Leighton Ruff, MD   40 mg at 03/02/16 1010  . famotidine (PEPCID) IVPB 20 mg premix  20 mg Intravenous Q12H PRN Jerrye Beavers, PA-C   20 mg at 03/02/16 1333  . gabapentin (NEURONTIN) capsule 300 mg  [id number] mg Oral QHS Leighton Ruff, MD   [id number] mg at 03/02/16 2129  . hydrochlorothiazide (HYDRODIURIL) tablet 12.5 mg  [id number] mg Oral Daily Leighton Ruff, MD   [id number] mg at 03/02/16 1011  . HYDROmorphone (DILAUDID) injection 1 mg  1 mg Intravenous Q2H PRN Jerrye Beavers, PA-C   1 mg at 03/03/16 0716  . loperamide (IMODIUM) capsule 4 mg  4 mg Oral TID Leighton Ruff, MD   4 mg at 03/02/16 2129  . LORazepam (ATIVAN) tablet 0.5 mg  0.5 mg Oral QHS Leighton Ruff, MD   0.5 mg at 03/02/16 2129  . methocarbamol (ROBAXIN) tablet 500 mg  500 mg Oral [id number] PRN Leighton Ruff, MD   [id number] mg at 03/03/16 0234  . ondansetron (ZOFRAN) tablet 4 mg  4 mg Oral [id number] PRN Leighton Ruff, MD       Or  . ondansetron [facility]) injection 4 mg  4 mg Intravenous  [id number] PRN Leighton Ruff, MD   4 mg at 02/28/16 1920  . promethazine (PHENERGAN) injection 12.5 mg  12.5 mg Intravenous Q6H PRN Mickeal Skinner, MD   12.5 mg at 02/28/16 0917  . sertraline (ZOLOFT) tablet 50 mg  50 mg Oral Daily Leighton Ruff, MD   50 mg at 03/02/16 1011     Objective: Vital: Vitals:   03/01/16 2109 03/02/16 0602 03/02/16 2134 03/03/16 0612  BP: (!) 116/56 131/60 127/63 136/81  Pulse: 84 95 99 (!) 102  Resp: 16 16 17 17   Temp: 98.2 F (36.8 C) 98.2 F (36.8 C) 98.4 F (36.9 C) 98.2 F (36.8 C)  TempSrc: Oral Oral Oral Oral  SpO2: 97% 99% 100% 98%  Weight:      Height:       I/Os: I/O last 3 completed shifts: In: -  Out: 5450 [Urine:3825; Drains:200; [id number]  Physical Exam:  General: Patient is in no apparent distress Lungs: Normal respiratory effort, chest expands symmetrically. GI: Incisions are c/d/i. The abdomen is soft and nontender without mass. Stoma pink.   JP drain with serosanguinous drainage Foley: straw colored  Ext: lower extremities symmetric  Lab Results: No results for input(s): WBC, HGB, HCT in the last 72 hours. No results for  input(s): NA, K, CL, CO2, GLUCOSE, BUN, CREATININE, CALCIUM in the last 72 hours. No results for input(s): LABPT, INR in the last 72 hours. No results for input(s): LABURIN in the last 72 hours. Results for orders placed or performed in visit on 02/06/16  HPV DNA Probe (High Risk)     Status: None   Collection Time: 02/06/16  3:11 PM  Result Value Ref Range Status   HPV, high-risk Negative Negative Final    Comment: This high-risk HPV test detects thirteen high-risk types (16/18/31/33/35/39/45/51/52/56/58/59/68) without differentiation.     Studies/Results: No results found.  Assessment: Procedure(s): XI ROBOTIC ASSISTED LOWER ANTERIOR RESECTION, diverting LOOP ILEOSTOMY, mobilization splenic flexure, omentopexy left ureteral neocystotomy,double J stent placement, closure vaginal cuff, 6 Days  Post-Op  doing well.  Plan: Okay to remove JP. She has f/u scheduled with me on Thursday for a voiding trial - I will reschedule this for Monday  October 9th.  Louis Meckel, MD Urology 03/03/2016, 7:53 AM

## 2016-03-03 NOTE — Progress Notes (Signed)
Per night nurse Vera,patient assisted the night NT to drain ileostomy bag. Will continue to monitor.

## 2016-03-03 NOTE — Progress Notes (Signed)
6 Days Post-Op robotic LAR with coloanal anastomosis, diverting loop ileostomy and bladder hitch ureteral reimplantation. Subjective: Pain better, Ambulating well, has minimal appettite, also with worsened nausea.    Objective: Vital signs in last 24 hours: Temp:  [98.2 F (36.8 C)-98.4 F (36.9 C)] 98.2 F (36.8 C) (10/03 0612) Pulse Rate:  [99-102] 102 (10/03 0612) Resp:  [17] 17 (10/03 0612) BP: (127-136)/(63-81) 136/81 (10/03 0612) SpO2:  [98 %-100 %] 98 % (10/03 0612)   Intake/Output from previous day: 10/02 0701 - 10/03 0700 In: -  Out: 2795 [Urine:2000; Drains:95; Stool:700] Intake/Output this shift: Total I/O In: 240 [P.O.:240] Out: 1365 [Urine:1300; Drains:30; Stool:35]   General appearance: alert and cooperative GI: soft, non-distended JP: SS fluid Ileostomy: beefy red, thick bilious output Incision: no significant drainage  Lab Results:  No results for input(s): WBC, HGB, HCT, PLT in the last 72 hours. BMET No results for input(s): NA, K, CL, CO2, GLUCOSE, BUN, CREATININE, CALCIUM in the last 72 hours. PT/INR No results for input(s): LABPROT, INR in the last 72 hours. ABG No results for input(s): PHART, HCO3 in the last 72 hours.  Invalid input(s): PCO2, PO2  MEDS, Scheduled . diclofenac  75 mg Oral BID  . enoxaparin (LOVENOX) injection  40 mg Subcutaneous Q24H  . gabapentin  300 mg Oral QHS  . hydrochlorothiazide  12.5 mg Oral Daily  . loperamide  2 mg Oral TID  . LORazepam  0.5 mg Oral QHS  . sertraline  50 mg Oral Daily  . sodium chloride flush  3 mL Intravenous Q12H    Studies/Results: No results found.  Assessment: s/p Procedure(s): XI ROBOTIC ASSISTED LOWER ANTERIOR RESECTION, diverting LOOP ILEOSTOMY, mobilization splenic flexure, omentopexy left ureteral neocystotomy,double J stent placement, closure vaginal cuff Patient Active Problem List   Diagnosis Date Noted  . Rectal cancer ([place]) 02/26/2016  . Malignant neoplasm of rectum ([place])  11/17/2015  . Anemia 10/01/2015  . Essential hypertension, benign 09/02/2015  . Obesity 09/02/2015  . Depression 05/02/2015  . Hyperthyroidism 02/27/2015  . Closed fracture of nasal bone 02/18/2015    Expected post op course  Plan: Do not remove foley  Cont soft diet SL MIV Decrease Imodium dose Encourage fluid intake and ambulation JP Cr normal, will d/c today Recheck labs in AM   LOS: 6 days     .Rosario Adie, [facility], [place]   03/03/2016 2:14 PM

## 2016-03-03 NOTE — Progress Notes (Signed)
Key Points: Use following P&T approved IV to PO antibiotic change policy.  Description contains the criteria that are approved Note: Policy Excludes:  Esophagectomy patientsPHARMACIST - PHYSICIAN COMMUNICATION DR:   Marcello Moores CONCERNING: IV to Oral Route Change Policy  RECOMMENDATION: This patient is receiving Benadryl and Pepcid by the intravenous route.  Based on criteria approved by the Pharmacy and Therapeutics Committee, the intravenous medication(s) is/are being converted to the equivalent oral dose form(s).   DESCRIPTION: These criteria include:  The patient is eating (either orally or via tube) and/or has been taking other orally administered medications for a least 24 hours  The patient has no evidence of active gastrointestinal bleeding or impaired GI absorption (gastrectomy, short bowel, patient on TNA or NPO).  If you have questions about this conversion, please contact the Pharmacy Department  []   [phone] )  [facility] []   [phone] )  [facility]  []   [phone] )  [facility] [x]   [phone] )  [place], [place] 03/03/2016 7:51 AM

## 2016-03-03 NOTE — Progress Notes (Signed)
Pt demonstrated to the RN how to empty her ileostomy bag. Pt demonstrated to the RN how to burp and properly clean the inside of the colostomy bag. Pt does not have any questions or concerns about her ileostomy bag at this time.  Harvest Stanco W Liyanna Cartwright, RN

## 2016-03-03 NOTE — Consult Note (Signed)
[place] Nurse ostomy follow up Stoma type/location: RLQ ileostomy Stomal assessment/size: per yesterday Peristomal assessment: not seen today.  Pouch applied yesterday is intact Treatment options for stomal/peristomal skin: skin barrier ring Output: Thin brown effluent Ostomy pouching: 1pc.flat pouch with skin barrier ring Education provided: Patient is in bed, reports emesis today.  Is attempting to eat a cream-based soup brought in from the outside for lunch.  Reports being successful (independent) in emptying pouch last evening, through the night and this morning. Still with flat affect, unenthusiastic regarding learning self-care.  Plan to remove bridge if approved by Dr. Marcello Moores with pouch change on Wednesday (tomorrow). Enrolled patient in [place] Start Discharge program: Yes [place] nursing team will follow, and will remain available to this patient, the surgical, nursing and medical teams.   Thanks, Maudie Flakes, MSN, RN, [place], Arther Abbott  Pager# [phone]

## 2016-03-04 LAB — CBC
HEMATOCRIT: 33.6 % — AB (ref 36.0–46.0)
Hemoglobin: 11.7 g/dL — ABNORMAL LOW (ref 12.0–15.0)
MCH: 30.4 pg (ref 26.0–34.0)
MCHC: 34.8 g/dL (ref 30.0–36.0)
MCV: 87.3 fL (ref 78.0–100.0)
Platelets: 369 10*3/uL (ref 150–400)
RBC: 3.85 MIL/uL — ABNORMAL LOW (ref 3.87–5.11)
RDW: 12.7 % (ref 11.5–15.5)
WBC: 8.1 10*3/uL (ref 4.0–10.5)

## 2016-03-04 LAB — BASIC METABOLIC PANEL
ANION GAP: 9 (ref 5–15)
BUN: 16 mg/dL (ref 6–20)
CALCIUM: 9.7 mg/dL (ref 8.9–10.3)
CO2: 27 mmol/L (ref 22–32)
Chloride: 99 mmol/L — ABNORMAL LOW (ref 101–111)
Creatinine, Ser: 0.53 mg/dL (ref 0.44–1.00)
Glucose, Bld: 137 mg/dL — ABNORMAL HIGH (ref 65–99)
Potassium: 4.3 mmol/L (ref 3.5–5.1)
Sodium: 135 mmol/L (ref 135–145)

## 2016-03-04 MED ORDER — ENSURE ENLIVE PO LIQD
237.0000 mL | Freq: Two times a day (BID) | ORAL | Status: DC
  Administered 2016-03-04 – 2016-03-05 (×3): 237 mL via ORAL

## 2016-03-04 MED ORDER — LOPERAMIDE HCL 2 MG PO CAPS
2.0000 mg | ORAL_CAPSULE | Freq: Three times a day (TID) | ORAL | Status: DC
  Administered 2016-03-04 – 2016-03-05 (×4): 2 mg via ORAL
  Filled 2016-03-04 (×4): qty 1

## 2016-03-04 NOTE — Progress Notes (Signed)
7 Days Post-Op robotic LAR with coloanal anastomosis, diverting loop ileostomy and bladder hitch ureteral reimplantation. Subjective: Pain better, Ambulating well, has minimal appetite but eating better, no nausea   Objective: Vital signs in last 24 hours: Temp:  [98.4 F (36.9 C)-98.7 F (37.1 C)] 98.4 F (36.9 C) (10/04 0420) Pulse Rate:  [88-96] 96 (10/04 0420) Resp:  [16-18] 16 (10/04 0420) BP: (118-136)/(60-78) 131/78 (10/04 0420) SpO2:  [96 %-98 %] 96 % (10/04 0420)   Intake/Output from previous day: 10/03 0701 - 10/04 0700 In: 463 [P.O.:460; I.V.:3] Out: 2515 [Urine:2250; Emesis/NG output:200; Drains:30; Stool:35] Intake/Output this shift: No intake/output data recorded.   General appearance: alert and cooperative GI: soft, non-distended Ileostomy: beefy red, bilious output Incision: no significant drainage  Lab Results:   Recent Labs  03/04/16 0557  WBC 8.1  HGB 11.7*  HCT 33.6*  PLT 369   BMET  Recent Labs  03/04/16 0557  NA 135  K 4.3  CL 99*  CO2 27  GLUCOSE 137*  BUN 16  CREATININE 0.53  CALCIUM 9.7   PT/INR No results for input(s): LABPROT, INR in the last 72 hours. ABG No results for input(s): PHART, HCO3 in the last 72 hours.  Invalid input(s): PCO2, PO2  MEDS, Scheduled . diclofenac  75 mg Oral BID  . enoxaparin (LOVENOX) injection  40 mg Subcutaneous Q24H  . gabapentin  300 mg Oral QHS  . hydrochlorothiazide  12.5 mg Oral Daily  . loperamide  2 mg Oral TID  . LORazepam  0.5 mg Oral QHS  . sertraline  50 mg Oral Daily  . sodium chloride flush  3 mL Intravenous Q12H    Studies/Results: No results found.  Assessment: s/p Procedure(s): XI ROBOTIC ASSISTED LOWER ANTERIOR RESECTION, diverting LOOP ILEOSTOMY, mobilization splenic flexure, omentopexy left ureteral neocystotomy,double J stent placement, closure vaginal cuff Patient Active Problem List   Diagnosis Date Noted  . Rectal cancer ([place]) 02/26/2016  . Malignant neoplasm  of rectum ([place]) 11/17/2015  . Anemia 10/01/2015  . Essential hypertension, benign 09/02/2015  . Obesity 09/02/2015  . Depression 05/02/2015  . Hyperthyroidism 02/27/2015  . Closed fracture of nasal bone 02/18/2015    Expected post op course  Plan: Do not remove foley  Reg diet, ensure to help with PO intake SL MIV Cont Imodium dose TID Encourage fluid intake and ambulation Labs look good   LOS: 7 days     .Rosario Adie, MD [facility], [place]   03/04/2016 2:14 PM

## 2016-03-04 NOTE — Consult Note (Signed)
[place] Nurse ostomy follow up Stoma type/location: RLQ ileostomy Stomal assessment/size:  1 and 1/2 inch, pouch is cut to 1 and 3/4 inch to accommodate parastomal "gulley", red with a few strands of white slough, raised. Rod is intact, removed today without incident. Peristomal assessment: Aforementioned gulley is filled with a skin barrier ring Treatment options for stomal/peristomal skin: See above Output Brown stool, still loose (as expected) but thicker with increasing diet Ostomy pouching: 1pc.flat pouch with skin barrier ring.  Recommend changing systems two to three times weekly. Education provided: Patient would benefit from increased activity; has not been OOB today.  Answering questions about her subsequent appointments, patient reports that she will have difficulty getting to MD appointments as no one is home with her during the day. She reports that she is to see urologist next Monday and Dr. Benay Spice on Friday of this week. Patient is independent in emptying ostomy pouch and in cleaning out the bottom 2-inches prior to resealing. Patient continues to cry each time she is asked about how she is feeling.  Support and encouragement provided. She will need continued reinforcement of instructions by [facility] when at home.   Enrolled patient in [place] Start Discharge program: Yes [place] nursing team will follow, and will remain available to this patient, the nursing, srugical and medical teams. . Thanks, Maudie Flakes, MSN, RN, [place], Arther Abbott  Pager# [phone]

## 2016-03-05 ENCOUNTER — Telehealth: Payer: Self-pay | Admitting: *Deleted

## 2016-03-05 MED ORDER — HYDROCODONE-ACETAMINOPHEN 5-325 MG PO TABS: 1.0000 | ORAL_TABLET | Freq: Four times a day (QID) | ORAL | 0 refills | Status: DC | PRN

## 2016-03-05 MED ORDER — LOPERAMIDE HCL 2 MG PO CAPS: 2.0000 mg | ORAL_CAPSULE | Freq: Three times a day (TID) | ORAL | 0 refills | Status: DC

## 2016-03-05 MED ORDER — HEPARIN SOD (PORK) LOCK FLUSH 100 UNIT/ML IV SOLN
250.0000 [IU] | INTRAVENOUS | Status: DC | PRN
  Filled 2016-03-05: qty 3

## 2016-03-05 MED ORDER — SODIUM CHLORIDE 0.9% FLUSH
10.0000 mL | INTRAVENOUS | Status: DC | PRN
  Administered 2016-03-05: 10 mL
  Filled 2016-03-05: qty 40

## 2016-03-05 MED ORDER — HEPARIN SOD (PORK) LOCK FLUSH 100 UNIT/ML IV SOLN
250.0000 [IU] | Freq: Every day | INTRAVENOUS | Status: DC
  Administered 2016-03-05: 250 [IU]

## 2016-03-05 MED ORDER — SODIUM CHLORIDE 0.9% FLUSH: 10.0000 mL | Freq: Two times a day (BID) | INTRAVENOUS | Status: DC

## 2016-03-05 MED ORDER — ENSURE ENLIVE PO LIQD: 237.0000 mL | Freq: Two times a day (BID) | ORAL | 12 refills | Status: DC

## 2016-03-05 NOTE — Progress Notes (Signed)
Discharge instructions given to pt, verbalized understanding. Left the unit in stable condition. 

## 2016-03-05 NOTE — Progress Notes (Signed)
Pt D/C home with PICC Line and foley cath. To be followed by Home health RN.

## 2016-03-05 NOTE — Progress Notes (Signed)
Peripherally Inserted Central Catheter/Midline Placement  The IV Nurse has discussed with the patient and/or persons authorized to consent for the patient, the purpose of this procedure and the potential benefits and risks involved with this procedure.  The benefits include less needle sticks, lab draws from the catheter, and the patient may be discharged home with the catheter. Risks include, but not limited to, infection, bleeding, blood clot (thrombus formation), and puncture of an artery; nerve damage and irregular heartbeat and possibility to perform a PICC exchange if needed/ordered by physician.  Alternatives to this procedure were also discussed.  [facility] PICC patient education guide, fact sheet on infection prevention and patient information card has been provided to patient /or left at bedside.    PICC/Midline Placement Documentation        Henderson [place] 03/05/2016, 3:15 PM

## 2016-03-05 NOTE — Telephone Encounter (Signed)
  Oncology Nurse Navigator Documentation  Navigator Location: [facility] Onc (03/05/16 1728) Navigator Encounter Type: Telephone (03/05/16 1728) Telephone: [facility] Crocker Call;Appt Confirmation/Clarification (03/05/16 1728)   Message from Dr. Marcello Moores that patient did not feel she would feel up to coming in to see Dr. Benay Spice on 10/10. Rescheduled for 10/19 at 4pm per Dr. Benay Spice. Left appointment on her voice mail w/request to call back to confirm.

## 2016-03-05 NOTE — Care Management Note (Signed)
Case Management Note  Patient Details  Name: Destiny [name] MRN: [id number] Date of Birth: 09-Oct-1965  Subjective/Objective:                  Rectal cancer [facility]) Action/Plan: Discharge planning Expected Discharge Date:                  Expected Discharge Plan:  [place]  In-House Referral:  NA  Discharge planning Services  CM Consult  Post Acute Care Choice:  Home Health Choice offered to:  Patient  DME Arranged:  IV pump/equipment DME Agency:  [place] Arranged:  RN [facility] [place] Agency:  [place]  Status of Service:  Completed, signed off  If discussed at [place] of Stay Meetings, dates discussed:    Additional Comments: CM spoke with [facility] rep, Manuela Schwartz to notify of [facility] for ostomy management and  for NS bolus MWF; pt to have PICC placed today.  Manuela Schwartz is following.  No other CM needs were communicated. Dellie Catholic, RN 03/05/2016, 12:11 PM

## 2016-03-05 NOTE — Discharge Summary (Signed)
Physician Discharge Summary  Patient ID: Destiny [name] MRN: [id number] DOB/AGE: 01/04/1966 50 y.o.  Admit date: 02/26/2016 Discharge date: 03/05/2016  Admission Diagnoses: rectal cancer  Discharge Diagnoses:  Active Problems:   Rectal cancer [facility])   Discharged Condition: good  Hospital Course: Patient admitted after surgery.  Her diet was advanced slowly.  She was transitioned to PO meds.  Her ileostomy output was high.  She was started on imodium and this came down to acceptable levels, but she continued to struggle to drink enough fluids.  I recommended a PICC and home IV fluids.  She agreed to this and was discharged once this was placed.    Consults: Urology: Dr Louis Meckel  Significant Diagnostic Studies: labs: cbc, chemistry  Treatments: IV hydration, analgesia: acetaminophen w/ codeine and surgery: robotic LAR  Discharge Exam: Blood pressure 132/67, pulse 97, temperature 98.2 F (36.8 C), temperature source Oral, resp. rate 18, height 5\' 2"  (1.575 m), weight 91.1 kg (200 lb 12 oz), SpO2 99 %. General appearance: alert and cooperative GI: soft, non-distended Incision/Wound: clean, dry, intact  Disposition: 01-Home or Self Care     Medication List    STOP taking these medications   cephALEXin 500 MG capsule Commonly known as:  KEFLEX     TAKE these medications   acetaminophen 500 MG tablet Commonly known as:  TYLENOL Take 1,000 mg by mouth every 8 (eight) hours as needed for moderate pain.   diclofenac 75 MG EC tablet Commonly known as:  VOLTAREN Take 1 tablet (75 mg total) by mouth 2 (two) times daily. For muscle and  Joint pain What changed:  when to take this  reasons to take this  additional instructions   feeding supplement (ENSURE ENLIVE) Liqd Take 237 mLs by mouth 2 (two) times daily between meals.   ferrous sulfate 325 (65 FE) MG tablet Take 325 mg by mouth daily with breakfast.   gabapentin 300 MG capsule Commonly known as:  NEURONTIN 1 CAPSULE  TODAY, TWICE A DAY FRIDAY, THEN 3 TIMES A DAY FOR 30 DAYS   hydrochlorothiazide 12.5 MG tablet Commonly known as:  HYDRODIURIL Take 1 tablet (12.5 mg total) by mouth daily.   HYDROcodone-acetaminophen 5-325 MG tablet Commonly known as:  NORCO Take 1-2 tablets by mouth every 6 (six) hours as needed for moderate pain. What changed:  how much to take   loperamide 2 MG capsule Commonly known as:  IMODIUM Take 1 capsule (2 mg total) by mouth 3 (three) times daily.   LORazepam 0.5 MG tablet Commonly known as:  ATIVAN TAKE  (1)  TABLET TWICE A DAY. What changed:  See the new instructions.   methocarbamol 500 MG tablet Commonly known as:  ROBAXIN TAKE (2) TABLETS 4 TIMES A DAY AS NEEDED FOR MUSCLE RELAXATION.   naproxen sodium 220 MG tablet Commonly known as:  ANAPROX Take 440 mg by mouth 2 (two) times daily as needed.   sertraline 50 MG tablet Commonly known as:  ZOLOFT TAKE 1 TABLET DAILY   VITAMIN C PO Take 1 tablet by mouth daily.      Follow-up Information    Ardis Hughs, MD Follow up in 1 week(s).   Specialty:  Urology Why:  An appointment will be made for you to have your catheter removed in 7-10 days.  You will be contacted. Contact information: [place] [address] [phone]        Rosario Adie., MD. Schedule an appointment as soon as possible  for a visit in 2 week(s).   Specialty:  General Surgery Contact information: [place] [phone]           Signed: Rosario Adie [id number], 123456 PM

## 2016-03-05 NOTE — Discharge Instructions (Signed)
ABDOMINAL SURGERY: POST OP INSTRUCTIONS  1. DIET: Follow a light bland diet.  Be sure to include lots of fluids daily.  Avoid fast food or heavy meals as your are more likely to get nauseated.  Do not eat any uncooked fruits or vegetables for the next 2 weeks as your colon heals. 2. Take your usually prescribed home medications unless otherwise directed. 3. PAIN CONTROL: a. Pain is best controlled by a usual combination of three different methods TOGETHER: i. Ice/Heat ii. Over the counter pain medication iii. Prescription pain medication b. Most patients will experience some swelling and bruising around the incisions.  Ice packs or heating pads (30-60 minutes up to 6 times a day) will help. Use ice for the first few days to help decrease swelling and bruising, then switch to heat to help relax tight/sore spots and speed recovery.  Some people prefer to use ice alone, heat alone, alternating between ice & heat.  Experiment to what works for you.  Swelling and bruising can take several weeks to resolve.   c. It is helpful to take an over-the-counter pain medication regularly for the first few weeks.  Choose one of the following that works best for you: i. Naproxen (Aleve, etc)  Two 220mg  tabs twice a day ii. Ibuprofen (Advil, etc) Three 200mg  tabs four times a day (every meal & bedtime) iii. Acetaminophen (Tylenol, etc) 500-650mg  four times a day (every meal & bedtime) d. A  prescription for pain medication (such as oxycodone, hydrocodone, etc) should be given to you upon discharge.  Take your pain medication as prescribed.  i. If you are having problems/concerns with the prescription medicine (does not control pain, nausea, vomiting, rash, itching, etc), please call us [phone] to see if we need to switch you to a different pain medicine that will work better for you and/or control your side effect better. ii. If you need a refill on your pain medication, please contact your pharmacy.  They will  contact our office to request authorization. Prescriptions will not be filled after 5 pm or on week-ends. 4. Avoid getting constipated.  Between the surgery and the pain medications, it is common to experience some constipation.  Increasing fluid intake and taking a fiber supplement (such as Metamucil, Citrucel, FiberCon, MiraLax, etc) 1-2 times a day regularly will usually help prevent this problem from occurring.  A mild laxative (prune juice, Milk of Magnesia, MiraLax, etc) should be taken according to package directions if there are no bowel movements after 48 hours.   5. Watch out for diarrhea.  If you have many loose bowel movements, simplify your diet to bland foods & liquids for a few days.  Stop any stool softeners and decrease your fiber supplement.  Switching to mild anti-diarrheal medications (Kayopectate, Pepto Bismol) can help.  If this worsens or does not improve, please call us. 6. Wash / shower every day.  You may shower over the incision / wound.  Avoid baths until the skin is fully healed.  Continue to shower over incision(s) after the dressing is off. 7. Remove your waterproof bandages 5 days after surgery.  You may leave the incision open to air.  You may replace a dressing/Band-Aid to cover the incision for comfort if you wish. 8. ACTIVITIES as tolerated:   a. You may resume regular (light) daily activities beginning the next day--such as daily self-care, walking, climbing stairs--gradually increasing activities as tolerated.  If you can walk 30 minutes without difficulty, it is safe  to try more intense activity such as jogging, treadmill, bicycling, low-impact aerobics, swimming, etc. b. Save the most intensive and strenuous activity for last such as sit-ups, heavy lifting, contact sports, etc  Refrain from any heavy lifting or straining until you are off narcotics for pain control.   c. DO NOT PUSH THROUGH PAIN.  Let pain be your guide: If it hurts to do something, don't do it.  Pain  is your body warning you to avoid that activity for another week until the pain goes down. d. You may drive when you are no longer taking prescription pain medication, you can comfortably wear a seatbelt, and you can safely maneuver your car and apply brakes. e. Dennis Bast may have sexual intercourse when it is comfortable.  9. FOLLOW UP in our office a. Please call [facility] at [phone] to set up an appointment to see your surgeon in the office for a follow-up appointment approximately 1-2 weeks after your surgery. b. Make sure that you call for this appointment the day you arrive home to insure a convenient appointment time. 10. IF YOU HAVE DISABILITY OR FAMILY LEAVE FORMS, BRING THEM TO THE OFFICE FOR PROCESSING.  DO NOT GIVE THEM TO YOUR DOCTOR.   WHEN TO CALL us [phone]: 1. Poor pain control 2. Reactions / problems with new medications (rash/itching, nausea, etc)  3. Fever over 101.5 F (38.5 C) 4. Inability to urinate 5. Nausea and/or vomiting 6. Worsening swelling or bruising 7. Continued bleeding from incision. 8. Increased pain, redness, or drainage from the incision  The clinic staff is available to answer your questions during regular business hours (8:30am-5pm).  Please dont hesitate to call and ask to speak to one of our nurses for clinical concerns.   A surgeon from [facility] is always on call at the hospitals   If you have a medical emergency, go to the nearest emergency room or call 911.    [facility], [place], [place], [place], [place]  [address] ? MAIN: [phone] ? TOLL FREE: [phone] ? FAX [phone] [email or web address]       Foley Catheter Care A soft, flexible tube (Foley catheter) may have been placed in your bladder to drain urine and fluid. Follow these instructions: Taking Care of the Catheter  Keep the area where the catheter leaves your body clean.   Attach the catheter to the  leg so there is no tension on the catheter.   Keep the drainage bag below the level of the bladder, but keep it OFF the floor.   Do not take long soaking baths. Your caregiver will give instructions about showering.   Wash your hands before touching ANYTHING related to the catheter or bag.   Using mild soap and warm water on a washcloth:   Clean the area closest to the catheter insertion site using a circular motion around the catheter.   Clean the catheter itself by wiping AWAY from the insertion site for several inches down the tube.   NEVER wipe upward as this could sweep bacteria up into the urethra (tube in your body that normally drains the bladder) and cause infection.   Place a small amount of sterile lubricant at the tip of the penis where the catheter is entering.  Taking Care of the Drainage Bags  Two drainage bags may be taken home: a large overnight drainage bag, and a smaller leg bag which fits underneath clothing.   It is okay  to wear the overnight bag at any time, but NEVER wear the smaller leg bag at night.   Keep the drainage bag well below the level of your bladder. This prevents backflow of urine into the bladder and allows the urine to drain freely.   Anchor the tubing to your leg to prevent pulling or tension on the catheter. Use tape or a leg strap provided by the hospital.   Empty the drainage bag when it is 1/2 to 3/4 full. Wash your hands before and after touching the bag.   Periodically check the tubing for kinks to make sure there is no pressure on the tubing which could restrict the flow of urine.  Changing the Drainage Bags  Cleanse both ends of the clean bag with alcohol before changing.   Pinch off the rubber catheter to avoid urine spillage during the disconnection.   Disconnect the dirty bag and connect the clean one.   Empty the dirty bag carefully to avoid a urine spill.   Attach the new bag to the leg with tape or a leg strap.  Cleaning  the Drainage Bags  Whenever a drainage bag is disconnected, it must be cleaned quickly so it is ready for the next use.   Wash the bag in warm, soapy water.   Rinse the bag thoroughly with warm water.   Soak the bag for 30 minutes in a solution of white vinegar and water (1 cup vinegar to 1 quart warm water).   Rinse with warm water.  SEEK MEDICAL CARE IF:   You have chills or night sweats.   You are leaking around your catheter or have problems with your catheter. It is not uncommon to have sporadic leakage around your catheter as a result of bladder spasms. If the leakage stops, there is not much need for concern. If you are uncertain, call your caregiver.   You develop side effects that you think are coming from your medicines.  SEEK IMMEDIATE MEDICAL CARE IF:   You are suddenly unable to urinate. Check to see if there are any kinks in the drainage tubing that may cause this. If you cannot find any kinks, call your caregiver immediately. This is an emergency.   You develop shortness of breath or chest pains.   Bleeding persists or clots develop in your urine.   You have a fever.   You develop pain in your back or over your lower belly (abdomen).   You develop pain or swelling in your legs.   Any problems you are having get worse rather than better.  MAKE SURE YOU:   Understand these instructions.   Will watch your condition.   Will get help right away if you are not doing well or get worse.

## 2016-03-06 ENCOUNTER — Telehealth: Payer: Self-pay | Admitting: *Deleted

## 2016-03-06 NOTE — Telephone Encounter (Addendum)
Patient called @ on 10/6 wanting to know if she could  Change her 10/19 appt with Dr.Sherrill to 10/10 appt was 1st scheduled for 10/10 but was Can. due to pt. Saying that she didn't think she would feel up to coming. Spoke with Francoise Schaumann she said she would ask Dr. Benay Spice if it would be ok. Pt. Was made aware of the conversation, I made her aware that if appt could be changed she would be notified by phone.

## 2016-03-06 NOTE — Telephone Encounter (Signed)
Sent to wrong pod - forwarded to Dr Lowanda Foster

## 2016-03-08 ENCOUNTER — Telehealth: Payer: Self-pay | Admitting: Oncology

## 2016-03-08 NOTE — Telephone Encounter (Signed)
Called and s/w pt re 10/19 appt move to 10/10 per staff message. Pt says she told the RN she needed to come in at 3 pm on 10/9. Explained to pt that I am not sure where the miscommunication happened but md has no avail slots on 10/9. Pt says she will take appt for 10/10 @ 3.15.

## 2016-03-09 ENCOUNTER — Encounter: Payer: Self-pay | Admitting: *Deleted

## 2016-03-09 ENCOUNTER — Telehealth: Payer: Self-pay | Admitting: Oncology

## 2016-03-09 ENCOUNTER — Ambulatory Visit (HOSPITAL_BASED_OUTPATIENT_CLINIC_OR_DEPARTMENT_OTHER): Payer: BLUE CROSS/BLUE SHIELD | Admitting: Oncology

## 2016-03-09 VITALS — BP 128/63 | HR 108 | Temp 98.1°F | Resp 17 | Ht 62.0 in | Wt 189.7 lb

## 2016-03-09 DIAGNOSIS — C20 Malignant neoplasm of rectum: Secondary | ICD-10-CM | POA: Diagnosis not present

## 2016-03-09 DIAGNOSIS — I10 Essential (primary) hypertension: Secondary | ICD-10-CM | POA: Diagnosis not present

## 2016-03-09 MED ORDER — OXYCODONE HCL 5 MG PO TABS: 5.0000 mg | ORAL_TABLET | Freq: Four times a day (QID) | ORAL | 0 refills | Status: DC | PRN

## 2016-03-09 MED ORDER — ONDANSETRON HCL 8 MG PO TABS: 8.0000 mg | ORAL_TABLET | Freq: Three times a day (TID) | ORAL | 0 refills | Status: DC | PRN

## 2016-03-09 MED ORDER — OXYCODONE HCL 5 MG PO TABS: 5.0000 mg | ORAL_TABLET | ORAL | 0 refills | Status: DC | PRN

## 2016-03-09 NOTE — Progress Notes (Signed)
  [facility] OFFICE PROGRESS NOTE   Diagnosis: Rectal cancer  INTERVAL HISTORY:   She was taken to the operating room by Dr. Marcello Moores on 02/26/2016 for a robotic assisted low anterior resection with a coloanal anastomosis and loop ileostomy. There was no evidence of metastatic disease. The anastomosis is at 0 cm from the anal verge. The left ureter was injured. Dr. Louis Meckel performed reimplantation of the left ureter with placement of a double-J stent.  She was discharged on 03/05/2016. She has high output from the ileostomy and was discharged with a PICC in place. She is receiving IV fluids 3 days per week. She reports leakage around the ostomy bag following discharge from the hospital. This has resolved. She is emptying the bag 2-3 times per day. The stool is semi-formed. She complains of nausea. She has abdominal pain. She is taking hydrocodone and believes this may be causing nausea.  The pathology from the low anterior resection ([id number]) confirmed an invasive poorly differentiated adenocarcinoma measuring 2 cm. The tumor was located in the distal third of the rectum. Tumor extended into the muscularis propria. Lymphovascular and perineural invasion were not identified. The resection margins were negative. A moderate treatment effect is present. 18 lymph nodes are negative for metastatic carcinoma.    Objective:  Vital signs in last 24 hours:  Blood pressure 128/63, pulse (!) 108, temperature 98.1 F (36.7 C), temperature source Oral, resp. rate 17, height '5\' 2"'$  (1.575 m), weight 189 lb 11.2 oz (86 kg), SpO2 98 %.    HEENT: No thrush, the mucus membranes are moist Resp: Lungs clear bilaterally Cardio: Regular rate and rhythm GI: No hepatomegaly, right abdomen ileostomy with a small amount of formed stool. Healing surgical incisions. Vascular: No leg edema  Portacath/PICC-without erythema  Lab Results:  Lab Results  Component Value Date   WBC 8.1 03/04/2016   HGB 11.7 (L) 03/04/2016   HCT 33.6 (L) 03/04/2016   MCV 87.3 03/04/2016   PLT 369 03/04/2016   NEUTROABS 4.3 01/07/2016     Medications: I have reviewed the patient's current medications.  Assessment/Plan: 1. Rectal cancer-clinical stage III (T3 N1)  No loss of mismatch repair protein expression  T3 N1 tumor beginning at 2 cm from the anal verge on an MRI of the pelvis 11/08/2015  CTs of the chest, abdomen, and pelvis 11/01/2015 with no evidence of distant metastatic disease  Initiation of neoadjuvant radiation/Xeloda 11/25/2015; completion of radiation/Xeloda 01/02/2016  Low anterior resection with a coloanal anastomosis 02/26/2016,ypT2,ypN0. Grade 3 adenocarcinoma,Negative resection margins  2. History of Hypertension  3. Left ureteral injury at the low anterior resection-status post a left ureteral neocystostomy and left double-J stent placement 02/26/2016  Disposition:  Destiny [name] is recovering from the low anterior resection. I reviewed the surgical pathology report with her. She has been diagnosed with pathologic stage I disease. However she had a pretreatment stage III tumor.  I recommend completing a course of adjuvant capecitabine. She is currently symptomatic with pain and nausea following surgery. She is receiving intravenous fluids at home for a high output ileostomy. We will not begin adjuvant therapy until her performance status improves.  I gave her prescription for oxycodone to use as needed for pain. She will try Zofran for nausea. Ms. Dewaine Conger will return for an office visit and further discussion on 03/20/2016.  Betsy Coder, MD  03/09/2016  2:29 PM

## 2016-03-09 NOTE — Progress Notes (Signed)
Oncology Nurse Navigator Documentation  Oncology Nurse Navigator Flowsheets 03/09/2016  Navigator Location [facility]  Navigator Encounter Type -  Telephone Outgoing Call  Abnormal Finding Date -  Confirmed Diagnosis Date -  Surgery Date 02/26/2016  Treatment Initiated Date -  Patient Visit Type MedOnc;Follow-up  Treatment Phase Pre-Tx/Tx Discussion  Barriers/Navigation Needs Education;Coordination of Care--needs IV pole for her fluids qod; asking when she sees surgeon again?; when does her nurse come out again?  Education -  Interventions Coordination of Care--called [facility] and was informed her nurse will see her on 03/10/16. She sees Dr. Marcello Moores again on 10/17 at 0930 (wrote this down for her). Called Brunilda Payor with DME at [facility] and requested IV pole be delivered to her home.  Referrals -  Coordination of Care Home Health  Education Method -  Support Groups/Services -  Acuity Level 2  Time Spent with Patient 30  She is asking when she can drive. Informed her no driving till she is seen by Dr. Marcello Moores, who will clear her for this and she should not drive while on narcotics.

## 2016-03-09 NOTE — Patient Instructions (Addendum)
Follow up with Dr. Marcello Moores on 03/17/16 at 0930 Do not drive until you are cleared by Dr. Marcello Moores and no longer taking narcotics for pain. Your [facility] nurse is due to see you on 03/10/16-- An IV pole has been ordered for you to be delivered Dr. Benay Spice plans to start Xeloda tablets twice daily for 14 days on and 7 days off for a total of 5 cycles-this will begin when you are stronger and eating.

## 2016-03-09 NOTE — Telephone Encounter (Signed)
Pt's appointment was double-booked with another on 10/10. Called her to come in this afternoon at 1:45 she agreed to appt.

## 2016-03-09 NOTE — Telephone Encounter (Signed)
Avs report and appointment schedule given to patient, per 10/09/017 los. °

## 2016-03-10 ENCOUNTER — Telehealth: Payer: Self-pay | Admitting: Endocrinology

## 2016-03-10 ENCOUNTER — Ambulatory Visit: Payer: BLUE CROSS/BLUE SHIELD | Admitting: Oncology

## 2016-03-10 ENCOUNTER — Encounter: Payer: Self-pay | Admitting: *Deleted

## 2016-03-10 NOTE — Telephone Encounter (Signed)
Patient stated no one has called concerning her lab results. please advise

## 2016-03-10 NOTE — Telephone Encounter (Signed)
I contacted the patient. She stated she had never received the MyChart message regarding her blood tests on 02/04/2016. Patient stated she has been off of her methimazole medication since then. Patient was advised to make her lab appointment and scheduled for 03/17/2016.

## 2016-03-10 NOTE — Progress Notes (Signed)
Oncology Nurse Navigator Documentation  Oncology Nurse Navigator Flowsheets 03/10/2016  Navigator Location [facility]  Navigator Encounter Type Letter/Fax/Email  Telephone -  Abnormal Finding Date -  Confirmed Diagnosis Date -  Surgery Date -  Treatment Initiated Date -  Patient Visit Type -  Treatment Phase -  Barriers/Navigation Needs Coordination of Care  Education -  Interventions Coordination of Care--email request to [facility] for Foundation One testing on case below  Referrals -  Coordination of Care Other--Pathology  Education Method -  Support Groups/Services -  Acuity -  Time Spent with Patient 15  Patient: Destiny [name], Destiny [name] Collected: 02/26/2016 Client: [facility] Accession: [id number] Received: 0000000 Leighton Ruff, MD DOB: March 22, 1966 Age: 3 Gender: F Reported: 02/28/2016 [place] Patient Ph: [phone] MRN #: [id number] [place], [place] [address] Visit #: [id number] Chart #: Phone: [phone] Fax: CC: REPORT

## 2016-03-16 ENCOUNTER — Telehealth: Payer: Self-pay | Admitting: Family Medicine

## 2016-03-16 NOTE — Telephone Encounter (Signed)
A medication that I would commonly go with his Myrbetriq but I do not know if that is okay in this situation and she will have to call the surgeon who put in the stent to make sure that it is okay that she go on this medication prior to last prescribing it.

## 2016-03-16 NOTE — Telephone Encounter (Signed)
Pt aware of Dr. Merita Norton medication suggestion and his recommendation to contact the surgeon who performed this surgery.

## 2016-03-17 ENCOUNTER — Other Ambulatory Visit: Payer: Self-pay | Admitting: Endocrinology

## 2016-03-17 ENCOUNTER — Other Ambulatory Visit (INDEPENDENT_AMBULATORY_CARE_PROVIDER_SITE_OTHER): Payer: BLUE CROSS/BLUE SHIELD

## 2016-03-17 DIAGNOSIS — E0590 Thyrotoxicosis, unspecified without thyrotoxic crisis or storm: Secondary | ICD-10-CM

## 2016-03-17 LAB — T4, FREE: FREE T4: 2.64 ng/dL — AB (ref 0.60–1.60)

## 2016-03-17 LAB — TSH: TSH: 0.06 u[IU]/mL — AB (ref 0.35–4.50)

## 2016-03-18 ENCOUNTER — Encounter (HOSPITAL_COMMUNITY): Payer: Self-pay | Admitting: Emergency Medicine

## 2016-03-18 DIAGNOSIS — Z432 Encounter for attention to ileostomy: Secondary | ICD-10-CM | POA: Insufficient documentation

## 2016-03-18 DIAGNOSIS — R1031 Right lower quadrant pain: Secondary | ICD-10-CM | POA: Diagnosis not present

## 2016-03-18 DIAGNOSIS — Y733 Surgical instruments, materials and gastroenterology and urology devices (including sutures) associated with adverse incidents: Secondary | ICD-10-CM | POA: Insufficient documentation

## 2016-03-18 DIAGNOSIS — I10 Essential (primary) hypertension: Secondary | ICD-10-CM | POA: Diagnosis not present

## 2016-03-18 DIAGNOSIS — Z85048 Personal history of other malignant neoplasm of rectum, rectosigmoid junction, and anus: Secondary | ICD-10-CM | POA: Insufficient documentation

## 2016-03-18 DIAGNOSIS — Z79899 Other long term (current) drug therapy: Secondary | ICD-10-CM | POA: Insufficient documentation

## 2016-03-18 DIAGNOSIS — G8918 Other acute postprocedural pain: Secondary | ICD-10-CM | POA: Insufficient documentation

## 2016-03-18 DIAGNOSIS — T819XXA Unspecified complication of procedure, initial encounter: Secondary | ICD-10-CM | POA: Diagnosis present

## 2016-03-18 NOTE — ED Triage Notes (Signed)
Pt states she has an ileostomy and it is leaking  Pt states she had her surgery on Sept 27th and stayed in the hospital for 9 days  Pt states she had problems with it leaking with it for the past several days   Pt states she sees Dr Learta Codding at the cancer center  Pt has a PIC line in her right arm

## 2016-03-19 ENCOUNTER — Telehealth: Payer: Self-pay

## 2016-03-19 ENCOUNTER — Emergency Department (HOSPITAL_COMMUNITY)
Admission: EM | Admit: 2016-03-19 | Discharge: 2016-03-19 | Disposition: A | Discharge status: Home / Self Care | Payer: BLUE CROSS/BLUE SHIELD | Attending: Emergency Medicine | Admitting: Emergency Medicine

## 2016-03-19 ENCOUNTER — Telehealth: Payer: Self-pay | Admitting: Endocrinology

## 2016-03-19 ENCOUNTER — Ambulatory Visit: Payer: BLUE CROSS/BLUE SHIELD | Admitting: Oncology

## 2016-03-19 DIAGNOSIS — Z432 Encounter for attention to ileostomy: Secondary | ICD-10-CM

## 2016-03-19 DIAGNOSIS — Z932 Ileostomy status: Secondary | ICD-10-CM

## 2016-03-19 DIAGNOSIS — G8918 Other acute postprocedural pain: Secondary | ICD-10-CM

## 2016-03-19 MED ORDER — ONDANSETRON 8 MG PO TBDP
8.0000 mg | ORAL_TABLET | Freq: Once | ORAL | Status: DC
  Filled 2016-03-19: qty 1

## 2016-03-19 MED ORDER — ONDANSETRON 8 MG PO TBDP
8.0000 mg | ORAL_TABLET | Freq: Once | ORAL | Status: AC
  Administered 2016-03-19: 8 mg via ORAL

## 2016-03-19 MED ORDER — HYDROMORPHONE HCL 1 MG/ML IJ SOLN
1.0000 mg | Freq: Once | INTRAMUSCULAR | Status: AC
  Administered 2016-03-19: 1 mg via INTRAMUSCULAR
  Filled 2016-03-19: qty 1

## 2016-03-19 MED ORDER — MORPHINE SULFATE (PF) 4 MG/ML IV SOLN
4.0000 mg | Freq: Once | INTRAVENOUS | Status: DC
  Filled 2016-03-19: qty 1

## 2016-03-19 NOTE — ED Notes (Signed)
Patient was alert, oriented and stable upon discharge. RN went over AVS and patient had no further questions.  

## 2016-03-19 NOTE — ED Notes (Signed)
Ostomy bag changed. Site cleaned.

## 2016-03-19 NOTE — ED Provider Notes (Signed)
[facility] DEPT Provider Note   CSN: [id number] Arrival date & time: 03/18/16  2226  By signing my name below, I, Dora Sims, attest that this documentation has been prepared under the direction and in the presence of physician practitioner, Orpah Greek, MD. Electronically Signed: Dora Sims, Scribe. 03/19/2016. 12:53 AM.  History   Chief Complaint Chief Complaint  Patient presents with  . Post-op Problem    The history is provided by the patient. No language interpreter was used.     HPI Comments: Destiny [name] is a 50 y.o. female with PMHx significant for rectal cancer who presents to the Emergency Department complaining of post-op problem over the last several days. Pt had an ileostomy on 02/26/16 and has been wearing an ileostomy bag since being discharged. Pt states stool from the bag has been leaking onto her skin over the last several days and is making her skin raw. She denies fever, chills, numbness, weakness, or any other associated symptoms.  Past Medical History:  Diagnosis Date  . Cancer ([place]) 10/29/15   rectal invasice adenocarcinoma   . Chronic back pain   . Depression   . Hypertension   . Hyperthyroidism   . Rectal cancer ([place])    Adenocarcinoma dx 10/29/15; Stage III, T3, N1, MO    Patient Active Problem List   Diagnosis Date Noted  . Rectal cancer ([place]) 02/26/2016  . Malignant neoplasm of rectum ([place]) 11/17/2015  . Anemia 10/01/2015  . Essential hypertension, benign 09/02/2015  . Obesity 09/02/2015  . Depression 05/02/2015  . Hyperthyroidism 02/27/2015  . Closed fracture of nasal bone 02/18/2015    Past Surgical History:  Procedure Laterality Date  . ABDOMINAL HYSTERECTOMY     vaginal   . BLADDER SURGERY    . EYE SURGERY    . ILEOSTOMY    . URETERAL REIMPLANTION Left 02/26/2016   Procedure: left ureteral neocystotomy,double J stent placement, closure vaginal cuff;  Surgeon: Ardis Hughs, MD;  Location: [facility];  Service:  Urology;  Laterality: Left;  Marland Kitchen VAGINAL HYSTERECTOMY    . XI ROBOTIC ASSISTED LOWER ANTERIOR RESECTION N/A 02/26/2016   Procedure: XI ROBOTIC ASSISTED LOWER ANTERIOR RESECTION, diverting LOOP ILEOSTOMY, mobilization splenic flexure, omentopexy;  Surgeon: Leighton Ruff, MD;  Location: [facility];  Service: General;  Laterality: N/A;    OB History    No data available       Home Medications    Prior to Admission medications   Medication Sig Start Date End Date Taking? Authorizing Provider  acetaminophen (TYLENOL) 500 MG tablet Take 1,000 mg by mouth every 8 (eight) hours as needed for moderate pain.   Yes Historical Provider, MD  Ascorbic Acid (VITAMIN C PO) Take 1 tablet by mouth daily.   Yes Historical Provider, MD  diclofenac (VOLTAREN) 75 MG EC tablet Take 1 tablet (75 mg total) by mouth 2 (two) times daily. For muscle and  Joint pain Patient taking differently: Take 75 mg by mouth 2 (two) times daily as needed for mild pain. For muscle and  Joint pain 01/24/16  Yes Claretta Fraise, MD  feeding supplement, ENSURE ENLIVE, (ENSURE ENLIVE) LIQD Take 237 mLs by mouth 2 (two) times daily between meals. [id number]  Yes Leighton Ruff, MD  ferrous sulfate 325 (65 FE) MG tablet Take 325 mg by mouth daily with breakfast.   Yes Historical Provider, MD  gabapentin (NEURONTIN) 300 MG capsule 1 CAPSULE TODAY, TWICE A DAY FRIDAY, THEN 3 TIMES A DAY FOR 30 DAYS 02/20/16  Yes Fransisca Kaufmann Dettinger, MD  loperamide (IMODIUM) 2 MG capsule Take 1 capsule (2 mg total) by mouth 3 (three) times daily. [id number]  Yes Leighton Ruff, MD  LORazepam (ATIVAN) 0.5 MG tablet TAKE  (1)  TABLET TWICE A DAY. Patient taking differently: Take 0.5mg s at bedtime 08/26/15  Yes Fransisca Kaufmann Dettinger, MD  methocarbamol (ROBAXIN) 500 MG tablet TAKE (2) TABLETS 4 TIMES A DAY AS NEEDED FOR MUSCLE RELAXATION. 02/20/16  Yes Fransisca Kaufmann Dettinger, MD  naproxen sodium (ANAPROX) 220 MG tablet Take 440 mg by mouth 2 (two) times daily as needed (pain).    Yes  Historical Provider, MD  ondansetron (ZOFRAN) 8 MG tablet Take 1 tablet (8 mg total) by mouth every 8 (eight) hours as needed for nausea or vomiting. 03/09/16  Yes Ladell Pier, MD  oxyCODONE (OXY IR/ROXICODONE) 5 MG immediate release tablet Take 1 tablet (5 mg total) by mouth every 6 (six) hours as needed for severe pain. 03/09/16  Yes Ladell Pier, MD  sertraline (ZOLOFT) 50 MG tablet TAKE 1 TABLET DAILY 02/20/16  Yes Fransisca Kaufmann Dettinger, MD    Family History Family History  Problem Relation Age of Onset  . Thyroid disease Mother   . Lung cancer Father     lung  . Cirrhosis Brother     Social History Social History  Substance Use Topics  . Smoking status: Never Smoker  . Smokeless tobacco: Never Used  . Alcohol use 0.0 oz/week     Comment: occ     Allergies   Latex   Review of Systems Review of Systems  Constitutional: Negative for chills and fever.  Neurological: Negative for weakness and numbness.  All other systems reviewed and are negative.   Physical Exam Updated Vital Signs BP 126/57 (BP Location: Left Arm)   Pulse 99   Temp 98.1 F (36.7 C) (Oral)   Resp 20   LMP  (LMP Unknown) Comment: perimenopausal  SpO2 98%   Physical Exam  Constitutional: She is oriented to person, place, and time. She appears well-developed and well-nourished. No distress.  HENT:  Head: Normocephalic and atraumatic.  Right Ear: Hearing normal.  Left Ear: Hearing normal.  Nose: Nose normal.  Mouth/Throat: Oropharynx is clear and moist and mucous membranes are normal.  Eyes: Conjunctivae and EOM are normal. Pupils are equal, round, and reactive to light.  Neck: Normal range of motion. Neck supple.  Cardiovascular: Regular rhythm, S1 normal and S2 normal.  Exam reveals no gallop and no friction rub.   No murmur heard. Pulmonary/Chest: Effort normal and breath sounds normal. No respiratory distress. She exhibits no tenderness.  Abdominal: Soft. Normal appearance and bowel sounds  are normal. There is no hepatosplenomegaly. There is no tenderness. There is no rebound, no guarding, no tenderness at McBurney's point and negative Murphy's sign. No hernia.  Ileostomy bag in the right lower abdomen is draining, some irritation around area where bag is stuck, leaking stool.  Musculoskeletal: Normal range of motion.  Neurological: She is alert and oriented to person, place, and time. She has normal strength. No cranial nerve deficit or sensory deficit. Coordination normal. GCS eye subscore is 4. GCS verbal subscore is 5. GCS motor subscore is 6.  Skin: Skin is warm, dry and intact. No rash noted. No cyanosis.  Psychiatric: She has a normal mood and affect. Her speech is normal and behavior is normal. Thought content normal.  Nursing note and vitals reviewed.   ED Treatments / Results  Labs (  all labs ordered are listed, but only abnormal results are displayed) Labs Reviewed - No data to display  EKG  EKG Interpretation None       Radiology No results found.  Procedures Procedures (including critical care time)  DIAGNOSTIC STUDIES: Oxygen Saturation is 98% on RA, normal by my interpretation.    COORDINATION OF CARE: 12:57 AM Discussed treatment plan with pt at bedside and pt agreed to plan.  Medications Ordered in ED Medications  ondansetron (ZOFRAN-ODT) disintegrating tablet 8 mg (8 mg Oral Given 03/19/16 0200)  HYDROmorphone (DILAUDID) injection 1 mg (1 mg Intramuscular Given 03/19/16 0203)     Initial Impression / Assessment and Plan / ED Course  I have reviewed the triage vital signs and the nursing notes.  Pertinent labs & imaging results that were available during my care of the patient were reviewed by me and considered in my medical decision making (see chart for details).  Clinical Course    Patient presents to the emergency department with complaints of leaking from the ileostomy bag and pain around the ostomy site from irritation from the  adhesive. Abdominal exam is benign. Patient administered analgesia and her back was replaced. She will need to follow-up with her surgeon for permanent solution to her recurrent leaking.  I personally performed the services described in this documentation, which was scribed in my presence. The recorded information has been reviewed and is accurate.   Final Clinical Impressions(s) / ED Diagnoses   Final diagnoses:  Post-operative pain  Ileostomy bag changed [facility])    New Prescriptions Discharge Medication List as of 03/19/2016  2:49 AM       Orpah Greek, MD 03/19/16 [id number]

## 2016-03-19 NOTE — Telephone Encounter (Signed)
Pt called in with questions about the RAI treatment, she said she wanted to make sure of the time that she didn't need to be around people after the treatment.

## 2016-03-20 ENCOUNTER — Ambulatory Visit (HOSPITAL_BASED_OUTPATIENT_CLINIC_OR_DEPARTMENT_OTHER): Payer: BLUE CROSS/BLUE SHIELD | Admitting: Nurse Practitioner

## 2016-03-20 ENCOUNTER — Other Ambulatory Visit (HOSPITAL_BASED_OUTPATIENT_CLINIC_OR_DEPARTMENT_OTHER): Payer: BLUE CROSS/BLUE SHIELD

## 2016-03-20 ENCOUNTER — Telehealth: Payer: Self-pay

## 2016-03-20 VITALS — BP 138/56 | HR 96 | Temp 98.3°F | Resp 18 | Ht 62.0 in | Wt 189.6 lb

## 2016-03-20 DIAGNOSIS — I10 Essential (primary) hypertension: Secondary | ICD-10-CM | POA: Diagnosis not present

## 2016-03-20 DIAGNOSIS — C20 Malignant neoplasm of rectum: Secondary | ICD-10-CM | POA: Diagnosis not present

## 2016-03-20 DIAGNOSIS — Z452 Encounter for adjustment and management of vascular access device: Secondary | ICD-10-CM

## 2016-03-20 LAB — CBC WITH DIFFERENTIAL/PLATELET
BASO%: 0 % (ref 0.0–2.0)
BASOS ABS: 0 10*3/uL (ref 0.0–0.1)
EOS ABS: 0.2 10*3/uL (ref 0.0–0.5)
EOS%: 3.8 % (ref 0.0–7.0)
HEMATOCRIT: 29.9 % — AB (ref 34.8–46.6)
HEMOGLOBIN: 10.3 g/dL — AB (ref 11.6–15.9)
LYMPH#: 0.7 10*3/uL — AB (ref 0.9–3.3)
LYMPH%: 12.3 % — ABNORMAL LOW (ref 14.0–49.7)
MCH: 28.9 pg (ref 25.1–34.0)
MCHC: 34.4 g/dL (ref 31.5–36.0)
MCV: 84 fL (ref 79.5–101.0)
MONO#: 0.6 10*3/uL (ref 0.1–0.9)
MONO%: 12 % (ref 0.0–14.0)
NEUT#: 3.8 10*3/uL (ref 1.5–6.5)
NEUT%: 71.9 % (ref 38.4–76.8)
PLATELETS: 337 10*3/uL (ref 145–400)
RBC: 3.56 10*6/uL — ABNORMAL LOW (ref 3.70–5.45)
RDW: 12 % (ref 11.2–14.5)
WBC: 5.3 10*3/uL (ref 3.9–10.3)

## 2016-03-20 LAB — COMPREHENSIVE METABOLIC PANEL
ALBUMIN: 2.9 g/dL — AB (ref 3.5–5.0)
ALK PHOS: 94 U/L (ref 40–150)
ALT: 17 U/L (ref 0–55)
AST: 17 U/L (ref 5–34)
Anion Gap: 11 mEq/L (ref 3–11)
BILIRUBIN TOTAL: 0.28 mg/dL (ref 0.20–1.20)
BUN: 10.4 mg/dL (ref 7.0–26.0)
CALCIUM: 9.6 mg/dL (ref 8.4–10.4)
CO2: 26 mEq/L (ref 22–29)
Chloride: 102 mEq/L (ref 98–109)
Creatinine: 0.7 mg/dL (ref 0.6–1.1)
Glucose: 145 mg/dl — ABNORMAL HIGH (ref 70–140)
POTASSIUM: 4.3 meq/L (ref 3.5–5.1)
Sodium: 138 mEq/L (ref 136–145)
Total Protein: 7 g/dL (ref 6.4–8.3)

## 2016-03-20 MED ORDER — OXYCODONE HCL 5 MG PO TABS: 5.0000 mg | ORAL_TABLET | Freq: Four times a day (QID) | ORAL | 0 refills | Status: DC | PRN

## 2016-03-20 MED ORDER — HEPARIN SOD (PORK) LOCK FLUSH 100 UNIT/ML IV SOLN
500.0000 [IU] | Freq: Once | INTRAVENOUS | Status: AC
  Administered 2016-03-20: 250 [IU] via INTRAVENOUS
  Filled 2016-03-20: qty 5

## 2016-03-20 MED ORDER — SODIUM CHLORIDE 0.9% FLUSH
10.0000 mL | INTRAVENOUS | Status: DC | PRN
  Administered 2016-03-20: 10 mL via INTRAVENOUS
  Filled 2016-03-20: qty 10

## 2016-03-20 NOTE — Telephone Encounter (Signed)
See previous note

## 2016-03-20 NOTE — Telephone Encounter (Signed)
Called patient after speaking with Dr.Ellison who states she needs to not be around people the day of the procedure and 3 days post. Patient states she just had surgery in September and they have not released her to drive yet, so she goes to see that doctor today and which case she will hopefully be released to drive and will continue with the RAI, if not she will have to reschedule it. Patient will call back with decision.

## 2016-03-20 NOTE — Progress Notes (Addendum)
  [facility] OFFICE PROGRESS NOTE   Diagnosis:  Rectal cancer  INTERVAL HISTORY:   Destiny [name] returns as scheduled. She continues to have problems with leakage of stool onto her skin. She was seen in the emergency department yesterday with this complaint. She has seen several ostomy nurses. She continues to have intermittent nausea. She is having abdominal pain. She describes ostomy output as ranging from liquid to clinic. She is taking Imodium 3 times a day. She receives IV fluids once weekly.  Objective:  Vital signs in last 24 hours:  Blood pressure (!) 138/56, pulse 96, temperature 98.3 F (36.8 C), temperature source Oral, resp. rate 18, height _0  (1.575 m), weight 189 lb 9.6 oz (86 kg), SpO2 100 %.    HEENT: No thrush or ulcers. Resp: Lungs clear bilaterally. Cardio: Regular rate and rhythm. GI: Abdomen is soft. Diffusely tender. Abdomen ileostomy with liquid output. Vascular: No leg edema.   Lab Results:  Lab Results  Component Value Date   WBC 5.3 03/20/2016   HGB 10.3 (L) 03/20/2016   HCT 29.9 (L) 03/20/2016   MCV 84.0 03/20/2016   PLT 337 03/20/2016   NEUTROABS 3.8 03/20/2016    Imaging:  No results found.  Medications: I have reviewed the patient's current medications.  Assessment/Plan: 1. Rectal cancer-clinical stage III (T3 N1)  No loss of mismatch repair protein expression  T3 N1 tumor beginning at 2 cm from the anal verge on an MRI of the pelvis 11/08/2015  CTs of the chest, abdomen, and pelvis 11/01/2015 with no evidence of distant metastatic disease  Initiation of neoadjuvant radiation/Xeloda 11/25/2015; completion of radiation/Xeloda 01/02/2016  Low anterior resection with a coloanal anastomosis 02/26/2016,ypT2,ypN0. Grade 3 adenocarcinoma, negative resection margins 02/26/2016  2. History of hypertension  3. Left ureteral injury at the low anterior resection-status post a left ureteral neocystostomy and left double-J stent  placement 02/26/2016   Disposition: Destiny [name] is having difficulty managing the ostomy, mainly with leakage of stool. We are trying to arrange for outpatient follow-up with the ostomy nurse. Her performance status is not adequate to begin adjuvant chemotherapy. We scheduled a return visit on 04/02/2016 to reevaluate. She will contact the office in the interim with problems.  Patient seen with Dr. Benay Spice.    Ned Card ANP/GNP-BC   03/20/2016  2:54 PM  This was a shared visit with Ned Card. Destiny [name] continues to recover from the ileostomy procedure. Adjuvant therapy will be held until her performance status improves.  Julieanne Manson, M.D.

## 2016-03-21 ENCOUNTER — Other Ambulatory Visit: Payer: Self-pay | Admitting: Family Medicine

## 2016-03-21 ENCOUNTER — Encounter (HOSPITAL_COMMUNITY): Payer: Self-pay | Admitting: *Deleted

## 2016-03-21 ENCOUNTER — Emergency Department (HOSPITAL_COMMUNITY)
Admission: EM | Admit: 2016-03-21 | Discharge: 2016-03-21 | Disposition: A | Discharge status: Home / Self Care | Payer: BLUE CROSS/BLUE SHIELD | Attending: Emergency Medicine | Admitting: Emergency Medicine

## 2016-03-21 DIAGNOSIS — R140 Abdominal distension (gaseous): Secondary | ICD-10-CM | POA: Diagnosis present

## 2016-03-21 DIAGNOSIS — F329 Major depressive disorder, single episode, unspecified: Secondary | ICD-10-CM

## 2016-03-21 DIAGNOSIS — I10 Essential (primary) hypertension: Secondary | ICD-10-CM | POA: Diagnosis not present

## 2016-03-21 DIAGNOSIS — F411 Generalized anxiety disorder: Secondary | ICD-10-CM

## 2016-03-21 DIAGNOSIS — Z85048 Personal history of other malignant neoplasm of rectum, rectosigmoid junction, and anus: Secondary | ICD-10-CM | POA: Insufficient documentation

## 2016-03-21 DIAGNOSIS — F32A Depression, unspecified: Secondary | ICD-10-CM

## 2016-03-21 DIAGNOSIS — Z79899 Other long term (current) drug therapy: Secondary | ICD-10-CM | POA: Insufficient documentation

## 2016-03-21 DIAGNOSIS — Z91040 Latex allergy status: Secondary | ICD-10-CM | POA: Insufficient documentation

## 2016-03-21 DIAGNOSIS — Z7189 Other specified counseling: Secondary | ICD-10-CM

## 2016-03-21 DIAGNOSIS — M545 Low back pain, unspecified: Secondary | ICD-10-CM

## 2016-03-21 MED ORDER — LIDOCAINE HCL 2 % EX GEL
1.0000 "application " | Freq: Once | CUTANEOUS | Status: AC
  Administered 2016-03-21: 1
  Filled 2016-03-21: qty 11

## 2016-03-21 NOTE — ED Notes (Signed)
New bag placed.

## 2016-03-21 NOTE — Care Management Note (Addendum)
Case Management Note  Patient Details  Name: Destiny [name] MRN: [id number] Date of Birth: 09/30/1965  Subjective/Objective:    Rectal invasive carcinoma,   S/p Ileostomy 02/26/2016,               Action/Plan: Discharge Planning: AVS reviewed:   Spoke to pt and boyfriend at bedside. States she has Ashley RN with [facility] that comes 2x per week. Her dtr was trained also on how to change ileostomy. Pt had concerns about seepage on to her skin and now having increase redness and pain. Pt to follow up with [facility]. NCM discussed disability and Medicaid. Instructed pt to follow up with SS Admin on her disability. Pt concerned about not being able to pay her bills since she is not working. Her health coverage is $116 per month and her boyfriend is helping her pay so it will not lapse. Discussed eating healthy diet and follow up with her specialist about seeing a Nutritionist. Explained healthy diet could aide in the healing process.    Expected Discharge Date:  03/21/2016              Expected Discharge Plan:  [place]  In-House Referral:  NA  Discharge planning Services  CM Consult  Post Acute Care Choice:  Home Health, Resumption of Svcs/PTA Provider Choice offered to:  Patient  DME Arranged:  N/A DME Agency:  NA  HH Arranged:  RN [place] Agency:  [facility]  Status of Service:  Completed, signed off  If discussed at [place] of Stay Meetings, dates discussed:    Additional Comments:  Erenest Rasher, RN 03/21/2016, 4:35 PM

## 2016-03-21 NOTE — ED Triage Notes (Addendum)
Pt stated the area around the bag is now reddened and raw.Pt is crying and stated the thought of hurting herself has crossed her mind. Pt would like to see a therapist. Husband as well would like to have counseling the patient -

## 2016-03-21 NOTE — ED Notes (Signed)
Pt illostomy bag saturated with loose stool and leaking noted from right side of dressing. Break down noted around stoma site. Pt wrenching in pain with touch. Lidocaine placed to break down per [place] PA.

## 2016-03-21 NOTE — ED Provider Notes (Signed)
[facility] DEPT Provider Note   CSN: [id number] Arrival date & time: 03/21/16  1238     History   Chief Complaint Chief Complaint  Patient presents with  . Bloated    Pt s/p ileostomy on Sept 27th 2017 and now she has leakage all aroud the bag and leakage from her anus as well. pt is crying stating that onone can seem to find the right size bag to fit.     HPI Destiny [name] is a 50 y.o. female with a past medical history of rectal invasive carcinoma and is status post ileostomy placement on 02/26/2016 who presents emergency Department with chief complaint of ostomy site pain and leakage as well as a bowel movement from the rectum. The patient. EMR is reviewed in she's had multiple visits for this. The patient complains of significant pain and leakage at the site of the ostomy. She has had nurse at home, changing the ostomy bags and had her go to [place] supplies several times for fitting. She states that it works for about 12 hours before he starts leaking again. She is significant skin breakdown and tenderness around the ostomy. Patient is extraordinarily frustrated with the situation. She is supposed to start chemotherapy but due to the fact that her ostomy has been giving her some any problems. The oncologist is waiting until this issue is resolved. Patient states that she also had a bowel movement for her rectum that looked like the same material that comes out of her ostomy bag. She is concerned because she was told she could not have a bowel movement. She denies fevers or chills. She has significant pain. Patient also states that She is extremely depressed. Chest her depression has been worse since the placement of the ostomy. Patient is very tearful and states that she had passive thoughts of suicidal ideation, but states that she would never act on them because she she will go to hell. Her boyfriend states that she has been extremely depressed and tearful and emotionally  labile.  HPI  Past Medical History:  Diagnosis Date  . Cancer ([place]) 10/29/15   rectal invasice adenocarcinoma   . Chronic back pain   . Depression   . Hypertension   . Hyperthyroidism   . Rectal cancer ([place])    Adenocarcinoma dx 10/29/15; Stage III, T3, N1, MO    Patient Active Problem List   Diagnosis Date Noted  . Rectal cancer ([place]) 02/26/2016  . Malignant neoplasm of rectum ([place]) 11/17/2015  . Anemia 10/01/2015  . Essential hypertension, benign 09/02/2015  . Obesity 09/02/2015  . Depression 05/02/2015  . Hyperthyroidism 02/27/2015  . Closed fracture of nasal bone 02/18/2015    Past Surgical History:  Procedure Laterality Date  . ABDOMINAL HYSTERECTOMY     vaginal   . BLADDER SURGERY    . EYE SURGERY    . ILEOSTOMY    . URETERAL REIMPLANTION Left 02/26/2016   Procedure: left ureteral neocystotomy,double J stent placement, closure vaginal cuff;  Surgeon: Ardis Hughs, MD;  Location: [facility];  Service: Urology;  Laterality: Left;  Marland Kitchen VAGINAL HYSTERECTOMY    . XI ROBOTIC ASSISTED LOWER ANTERIOR RESECTION N/A 02/26/2016   Procedure: XI ROBOTIC ASSISTED LOWER ANTERIOR RESECTION, diverting LOOP ILEOSTOMY, mobilization splenic flexure, omentopexy;  Surgeon: Leighton Ruff, MD;  Location: [facility];  Service: General;  Laterality: N/A;    OB History    No data available       Home Medications    Prior  to Admission medications   Medication Sig Start Date End Date Taking? Authorizing Provider  Ascorbic Acid (VITAMIN C PO) Take 1 tablet by mouth daily.   Yes Historical Provider, MD  feeding supplement, ENSURE ENLIVE, (ENSURE ENLIVE) LIQD Take 237 mLs by mouth 2 (two) times daily between meals. [id number]  Yes Leighton Ruff, MD  ferrous sulfate 325 (65 FE) MG tablet Take 325 mg by mouth daily with breakfast.   Yes Historical Provider, MD  gabapentin (NEURONTIN) 300 MG capsule 1 CAPSULE TODAY, TWICE A DAY FRIDAY, THEN 3 TIMES A DAY FOR 30 DAYS 02/20/16  Yes Fransisca Kaufmann Dettinger, MD   loperamide (IMODIUM) 2 MG capsule Take 1 capsule (2 mg total) by mouth 3 (three) times daily. [id number]  Yes Leighton Ruff, MD  LORazepam (ATIVAN) 0.5 MG tablet TAKE  (1)  TABLET TWICE A DAY. Patient taking differently: Take 0.5mg s at bedtime 08/26/15  Yes Fransisca Kaufmann Dettinger, MD  naproxen sodium (ANAPROX) 220 MG tablet Take 440 mg by mouth 2 (two) times daily as needed (pain).    Yes Historical Provider, MD  ondansetron (ZOFRAN) 8 MG tablet Take 1 tablet (8 mg total) by mouth every 8 (eight) hours as needed for nausea or vomiting. 03/09/16  Yes Ladell Pier, MD  oxyCODONE (OXY IR/ROXICODONE) 5 MG immediate release tablet Take 1 tablet (5 mg total) by mouth every 6 (six) hours as needed for severe pain. 03/20/16  Yes Owens Shark, NP  sertraline (ZOLOFT) 50 MG tablet TAKE 1 TABLET DAILY 02/20/16  Yes Fransisca Kaufmann Dettinger, MD  acetaminophen (TYLENOL) 500 MG tablet Take 1,000 mg by mouth every 8 (eight) hours as needed for moderate pain.    Historical Provider, MD  diclofenac (VOLTAREN) 75 MG EC tablet Take 1 tablet (75 mg total) by mouth 2 (two) times daily. For muscle and  Joint pain 01/24/16   Claretta Fraise, MD  methocarbamol (ROBAXIN) 500 MG tablet TAKE (2) TABLETS 4 TIMES A DAY AS NEEDED FOR MUSCLE RELAXATION. 02/20/16   Fransisca Kaufmann Dettinger, MD    Family History Family History  Problem Relation Age of Onset  . Thyroid disease Mother   . Lung cancer Father     lung  . Cirrhosis Brother     Social History Social History  Substance Use Topics  . Smoking status: Never Smoker  . Smokeless tobacco: Never Used  . Alcohol use 0.0 oz/week     Comment: occ     Allergies   Latex   Review of Systems Review of Systems Ten systems reviewed and are negative for acute change, except as noted in the HPI.    Physical Exam Updated Vital Signs BP 140/60   Pulse 110   Temp 98.2 F (36.8 C)   Resp 18   Ht 5\' 2"  (1.575 m)   Wt 85.7 kg   LMP  (LMP Unknown) Comment: perimenopausal  SpO2 100%    BMI 34.57 kg/m   Physical Exam  Constitutional: She appears well-developed and well-nourished.  Tearful.   HENT:  Head: Normocephalic and atraumatic.  Eyes: EOM are normal. Pupils are equal, round, and reactive to light.  Neck: Normal range of motion. Neck supple.  Cardiovascular: Normal rate.   Pulmonary/Chest: Effort normal.  Abdominal: She exhibits no distension. There is tenderness.  Ostomy present in the RLQ. There is a lot if irritated skin breakdown around the ostomy site. No signs of infection.  Genitourinary:  Genitourinary Comments: Digital Rectal Exam reveals sphincter with good tone. No external  hemorrhoids. No masses or fissures. Stool color is brown with no overt blood.   Nursing note and vitals reviewed.    ED Treatments / Results  Labs (all labs ordered are listed, but only abnormal results are displayed) Labs Reviewed - No data to display  EKG  EKG Interpretation None       Radiology No results found.  Procedures Procedures (including critical care time)  Medications Ordered in ED Medications - No data to display   Initial Impression / Assessment and Plan / ED Course  I have reviewed the triage vital signs and the nursing notes.  Pertinent labs & imaging results that were available during my care of the patient were reviewed by me and considered in my medical decision making (see chart for details).  Clinical Course    Patient given OP resources by the TTS counselor [place]. You've. Also consultation with Waylan Boga and patient does appear appropriate for outpatient resources. Patient states that she is not actively suicidal. Patient has also been seen by the case manager who will help her to get established with the wound care center for adjustment of her ostomy site. There is no ostomy nurse on call today. I spoke with Dr. Windle Guard from surgery and she states that it is normal for the patient's to have bowel movements with a loop ileostomy. The  patient will be discharged to follow up with the services.  Given referral to the wound care center. She appears safe for discharge at this time.  Final Clinical Impressions(s) / ED Diagnoses   Final diagnoses:  None    New Prescriptions New Prescriptions   No medications on file     Margarita Mail, PA-C 03/21/16 1622    Lacretia Leigh, MD 03/21/16 1718

## 2016-03-21 NOTE — ED Notes (Signed)
Care management at bedside.

## 2016-03-21 NOTE — BH Assessment (Signed)
Spoke with patient face to face per request from [place]. Patient states that she is "not happy with my situation" referring to having a colostomy bag and denies suicidal and homicidal ideations. Patient states that she has had several problems with the bag and it has been difficult to manage. TTS asked patient if she had reached out for therapy or medication management and patient stated that she had not. Patient states that she would "think" about outpatient treatment. Patient was offered outpatient resources for therapy and medication management. TTS Counselor also provided information for resources for the MH-IOP with [facility]. TTS offered to complete a referral and patient declined.  Patient states that she would prefer not to drive to [facility] for three hours per day. TTS left information with patient. TTS explained mobile crisis to patient and provided patient with a mobile crisis card to use if needed.  Patient denies any other questions or concerns at this time. Patient was encouraged to let staff know if she changed her mind and would like to speak with TTS again.   Updated Margarita Mail, PA-C on conversation with patient.   Rosalin Hawking, LCSW Therapeutic Triage Specialist [place] 03/21/2016 3:55 PM

## 2016-03-23 ENCOUNTER — Telehealth: Payer: Self-pay | Admitting: *Deleted

## 2016-03-23 NOTE — Telephone Encounter (Signed)
Message from [place] with [facility] and [place] reporting pt misplaced Oxycodone Rx that was written on 10/20. Requesting new Rx to be faxed to their office.  Called pt, she confirms she lost prescription. She has several tablets left. Encouraged her to look again for prescription. Will discuss options with provider.

## 2016-03-25 ENCOUNTER — Encounter: Payer: Self-pay | Admitting: *Deleted

## 2016-03-25 NOTE — Progress Notes (Signed)
[facility] results to MD desk.

## 2016-03-26 ENCOUNTER — Encounter: Payer: Self-pay | Admitting: Family

## 2016-03-26 ENCOUNTER — Ambulatory Visit (INDEPENDENT_AMBULATORY_CARE_PROVIDER_SITE_OTHER): Payer: BLUE CROSS/BLUE SHIELD | Admitting: Family

## 2016-03-26 VITALS — BP 125/70 | HR 104 | Temp 97.6°F | Ht 62.0 in | Wt 185.4 lb

## 2016-03-26 DIAGNOSIS — F331 Major depressive disorder, recurrent, moderate: Secondary | ICD-10-CM | POA: Diagnosis not present

## 2016-03-26 DIAGNOSIS — N3001 Acute cystitis with hematuria: Secondary | ICD-10-CM | POA: Diagnosis not present

## 2016-03-26 DIAGNOSIS — R1084 Generalized abdominal pain: Secondary | ICD-10-CM | POA: Diagnosis not present

## 2016-03-26 MED ORDER — CEFTRIAXONE SODIUM 1 G IJ SOLR
1.0000 g | Freq: Once | INTRAMUSCULAR | Status: AC
  Administered 2016-03-26: 1 g via INTRAMUSCULAR

## 2016-03-26 MED ORDER — SULFAMETHOXAZOLE-TRIMETHOPRIM 800-160 MG PO TABS: 1.0000 | ORAL_TABLET | Freq: Two times a day (BID) | ORAL | 0 refills | Status: DC

## 2016-03-26 MED ORDER — SERTRALINE HCL 100 MG PO TABS: 100.0000 mg | ORAL_TABLET | Freq: Every day | ORAL | 5 refills | Status: DC

## 2016-03-26 NOTE — Addendum Note (Signed)
Addended by: Evelina Dun A on: 03/26/2016 03:31 PM   Modules accepted: Orders

## 2016-03-26 NOTE — Progress Notes (Addendum)
   Subjective:    Patient ID: Destiny [name], female    DOB: [birth year], 50 y.o.   MRN: [id number]  PT presents to the office today with left flank pain and abd pain that started three days ago. PT has rectal cancer and has surgery last month and had an ileostomy placed. PT is followed by homehealth. PT states she has had several complications with this. Pt states her depression and anxiety is increased. Pt requesting ot have zoloft increased today.  Back Pain  This is a new problem. The current episode started in the past 7 days. The problem occurs constantly. Pain location: left flank. The quality of the pain is described as aching. The pain is at a severity of 7/10. The pain is moderate. The symptoms are aggravated by lying down. Associated symptoms include abdominal pain and dysuria. She has tried bed rest for the symptoms. The treatment provided mild relief.  Dysuria   This is a new problem. The current episode started in the past 7 days. The problem occurs every urination. The problem has been gradually worsening. The quality of the pain is described as aching. The pain is at a severity of 4/10. The pain is mild. Associated symptoms include flank pain, frequency, hesitancy, nausea and urgency. Pertinent negatives include no discharge, hematuria or vomiting. She has tried nothing for the symptoms. Her past medical history is significant for catheterization and a urological procedure.      Review of Systems  Respiratory: Negative.   Cardiovascular: Negative.   Gastrointestinal: Positive for abdominal pain and nausea. Negative for vomiting.  Genitourinary: Positive for dysuria, flank pain, frequency, hesitancy and urgency. Negative for hematuria.  Musculoskeletal: Positive for back pain.  Hematological: Negative.        Objective:   Physical Exam  Constitutional: She is oriented to person, place, and time. She appears well-developed and well-nourished.  HENT:  Head: Normocephalic.    Cardiovascular: Normal rate, regular rhythm, normal heart sounds and intact distal pulses.   Pulmonary/Chest: Effort normal and breath sounds normal.  Abdominal: There is tenderness. There is guarding.  Genitourinary:  Genitourinary Comments: Ileostomy in place  Musculoskeletal: Normal range of motion.  Neurological: She is alert and oriented to person, place, and time.  Skin: Skin is warm and dry.  Psychiatric: She has a normal mood and affect. Her behavior is normal. Judgment and thought content normal.      BP 125/70   Pulse (!) 104   Temp 97.6 F (36.4 C) (Oral)   Ht 5\' 2"  (1.575 m)   Wt 185 lb 6.4 oz (84.1 kg)   LMP  (LMP Unknown) Comment: perimenopausal  BMI 33.91 kg/m      Assessment & Plan:  1. Generalized abdominal pain - Urinalysis, Complete  2. Acute cystitis with hematuria -Force fluids AZO over the counter X2 days RTO 2 weeks to recheck urine Culture pending - cefTRIAXone (ROCEPHIN) injection 1 g; Inject 1 g into the muscle once. - Urine culture - sulfamethoxazole-trimethoprim (BACTRIM DS) 800-160 MG tablet; Take 1 tablet by mouth 2 (two) times daily.  Dispense: 14 tablet; Refill: 0   3. Moderate episode of recurrent major depressive disorder ([place]) -Zoloft increased to 100 mg daily Stress management discussed - sertraline (ZOLOFT) 100 MG tablet; Take 1 tablet (100 mg total) by mouth daily.  Dispense: 30 tablet; Refill: Turner, FNP

## 2016-03-26 NOTE — Patient Instructions (Signed)
Asymptomatic Bacteriuria, Female Asymptomatic bacteriuria is the presence of a large number of bacteria in your urine without the usual symptoms of burning or frequent urination. The following conditions increase the risk of asymptomatic bacteriuria:  Diabetes mellitus.  Advanced age.  Pregnancy in the first trimester.  Kidney stones.  Kidney transplants.  Leaky kidney tube valve in young children (reflux). Treatment for this condition is not needed in most people and can lead to other problems such as too much yeast and growth of resistant bacteria. However, some people, such as pregnant women, do need treatment to prevent kidney infection. Asymptomatic bacteriuria in pregnancy is also associated with fetal growth restriction, premature labor, and newborn death. HOME CARE INSTRUCTIONS Monitor your condition for any changes. The following actions may help to relieve any discomfort you are feeling:  Drink enough water and fluids to keep your urine clear or pale yellow. Go to the bathroom more often to keep your bladder empty.  Keep the area around your vagina and rectum clean. Wipe yourself from front to back after urinating. SEEK IMMEDIATE MEDICAL CARE IF:  You develop signs of an infection such as:  Burning with urination.  Frequency of voiding.  Back pain.  Fever.  You have blood in the urine.  You develop a fever. MAKE SURE YOU:  Understand these instructions.  Will watch your condition.  Will get help right away if you are not doing well or get worse.   This information is not intended to replace advice given to you by your health care provider. Make sure you discuss any questions you have with your health care provider.   Document Released: 05/18/2005 Document Revised: 06/08/2014 Document Reviewed: 11/07/2012 Elsevier Interactive Patient Education 2016 [employer]  

## 2016-03-29 ENCOUNTER — Encounter (HOSPITAL_COMMUNITY): Payer: Self-pay | Admitting: Emergency Medicine

## 2016-03-29 ENCOUNTER — Emergency Department (HOSPITAL_COMMUNITY)
Admission: EM | Admit: 2016-03-29 | Discharge: 2016-03-29 | Disposition: A | Discharge status: Home / Self Care | Payer: BLUE CROSS/BLUE SHIELD | Attending: Emergency Medicine | Admitting: Emergency Medicine

## 2016-03-29 DIAGNOSIS — N3001 Acute cystitis with hematuria: Secondary | ICD-10-CM | POA: Diagnosis not present

## 2016-03-29 DIAGNOSIS — I10 Essential (primary) hypertension: Secondary | ICD-10-CM | POA: Diagnosis not present

## 2016-03-29 DIAGNOSIS — Z79899 Other long term (current) drug therapy: Secondary | ICD-10-CM | POA: Diagnosis not present

## 2016-03-29 DIAGNOSIS — Z85048 Personal history of other malignant neoplasm of rectum, rectosigmoid junction, and anus: Secondary | ICD-10-CM | POA: Insufficient documentation

## 2016-03-29 DIAGNOSIS — Z7189 Other specified counseling: Secondary | ICD-10-CM

## 2016-03-29 DIAGNOSIS — R109 Unspecified abdominal pain: Secondary | ICD-10-CM | POA: Diagnosis present

## 2016-03-29 LAB — URINE MICROSCOPIC-ADD ON

## 2016-03-29 LAB — URINALYSIS, ROUTINE W REFLEX MICROSCOPIC
BILIRUBIN URINE: NEGATIVE
Glucose, UA: NEGATIVE mg/dL
Ketones, ur: NEGATIVE mg/dL
NITRITE: NEGATIVE
PH: 6 (ref 5.0–8.0)
Protein, ur: 100 mg/dL — AB
SPECIFIC GRAVITY, URINE: 1.022 (ref 1.005–1.030)

## 2016-03-29 MED ORDER — CEPHALEXIN 500 MG PO CAPS
500.0000 mg | ORAL_CAPSULE | Freq: Once | ORAL | Status: AC
  Administered 2016-03-29: 500 mg via ORAL
  Filled 2016-03-29: qty 1

## 2016-03-29 MED ORDER — LIDOCAINE-PRILOCAINE 2.5-2.5 % EX CREA
TOPICAL_CREAM | Freq: Once | CUTANEOUS | Status: AC
  Administered 2016-03-29: 13:00:00 via TOPICAL
  Filled 2016-03-29: qty 5

## 2016-03-29 MED ORDER — LIDOCAINE-PRILOCAINE 2.5-2.5 % EX CREA: 1.0000 "application " | TOPICAL_CREAM | CUTANEOUS | 2 refills | Status: DC | PRN

## 2016-03-29 MED ORDER — CEPHALEXIN 500 MG PO CAPS: 500.0000 mg | ORAL_CAPSULE | Freq: Two times a day (BID) | ORAL | 0 refills | Status: DC

## 2016-03-29 MED ORDER — HYDROMORPHONE HCL 1 MG/ML IJ SOLN
1.0000 mg | Freq: Once | INTRAMUSCULAR | Status: AC
  Administered 2016-03-29: 1 mg via INTRAMUSCULAR
  Filled 2016-03-29: qty 1

## 2016-03-29 NOTE — ED Provider Notes (Signed)
[facility] DEPT Provider Note   CSN: [id number] Arrival date & time: 03/29/16  1121     History   Chief Complaint Chief Complaint  Patient presents with  . Abdominal Pain    pain/reddness  at site of ileostomy    HPI Destiny [name] is a 50 y.o. female.  The history is provided by the patient and medical records.  Abdominal Pain     50 y.o. F with hx of rectal invasive adenocarcinoma s/p ileostomy in September 2017, chronic back pain, depression, HTN, hyperthyroidism, presenting to the ED for skin irritation around her ostomy and leakage from the bag.  This seems to be a recurrent issue for her since her surgery.  States they have tried several different bags, however she continues having leakage.  States it seems as if the bag does not "seal" to her skin properly.  She states skin around his stoma is very irritated and sore.  She denies fever or chills.  She does have home health nurse that comes to her home to help with ostomy care twice a week.  States she ended up calling them extra last week because the bag was leaking.  Her daughter has been helping with ostomy care as well.  She is followed by oncology, Dr. Benay Spice.  Also reports urine has been dark in color.  States she was seen by NP a few days ago and told she had a UTI.  States she was given shot of Rocephin in office and has been taking bactrim as directed.  No fever, chills, or flank pain.  No nausea/vomiting.  Past Medical History:  Diagnosis Date  . Cancer ([place]) 10/29/15   rectal invasice adenocarcinoma   . Chronic back pain   . Depression   . Hypertension   . Hyperthyroidism   . Rectal cancer ([place])    Adenocarcinoma dx 10/29/15; Stage III, T3, N1, MO    Patient Active Problem List   Diagnosis Date Noted  . Rectal cancer ([place]) 02/26/2016  . Malignant neoplasm of rectum ([place]) 11/17/2015  . Anemia 10/01/2015  . Essential hypertension, benign 09/02/2015  . Obesity 09/02/2015  . Depression 05/02/2015  .  Hyperthyroidism 02/27/2015  . Closed fracture of nasal bone 02/18/2015    Past Surgical History:  Procedure Laterality Date  . ABDOMINAL HYSTERECTOMY     vaginal   . BLADDER SURGERY    . EYE SURGERY    . ILEOSTOMY    . URETERAL REIMPLANTION Left 02/26/2016   Procedure: left ureteral neocystotomy,double J stent placement, closure vaginal cuff;  Surgeon: Ardis Hughs, MD;  Location: [facility]S;  Service: Urology;  Laterality: Left;  Marland Kitchen VAGINAL HYSTERECTOMY    . XI ROBOTIC ASSISTED LOWER ANTERIOR RESECTION N/A 02/26/2016   Procedure: XI ROBOTIC ASSISTED LOWER ANTERIOR RESECTION, diverting LOOP ILEOSTOMY, mobilization splenic flexure, omentopexy;  Surgeon: Leighton Ruff, MD;  Location: [facility];  Service: General;  Laterality: N/A;    OB History    No data available       Home Medications    Prior to Admission medications   Medication Sig Start Date End Date Taking? Authorizing Provider  acetaminophen (TYLENOL) 500 MG tablet Take 1,000 mg by mouth every 8 (eight) hours as needed for moderate pain.    Historical Provider, MD  Ascorbic Acid (VITAMIN C PO) Take 1 tablet by mouth daily.    Historical Provider, MD  diclofenac (VOLTAREN) 75 MG EC tablet Take 1 tablet (75 mg total) by mouth 2 (two) times  daily. For muscle and  Joint pain 01/24/16   Claretta Fraise, MD  feeding supplement, ENSURE ENLIVE, (ENSURE ENLIVE) LIQD Take 237 mLs by mouth 2 (two) times daily between meals. [id number]   Leighton Ruff, MD  ferrous sulfate 325 (65 FE) MG tablet Take 325 mg by mouth daily with breakfast.    Historical Provider, MD  gabapentin (NEURONTIN) 300 MG capsule 1 CAPSULE TODAY, TWICE A DAY FRIDAY, THEN 3 TIMES A DAY FOR 30 DAYS Patient taking differently: Take one tablet by mouth twice daily. 02/20/16   Fransisca Kaufmann Dettinger, MD  loperamide (IMODIUM) 2 MG capsule Take 1 capsule (2 mg total) by mouth 3 (three) times daily. [id number]   Leighton Ruff, MD  LORazepam (ATIVAN) 0.5 MG tablet TAKE  (1)  TABLET TWICE A  DAY. Patient taking differently: Take 0.5mg s at bedtime 08/26/15   Fransisca Kaufmann Dettinger, MD  methocarbamol (ROBAXIN) 500 MG tablet TAKE (2) TABLETS 4 TIMES A DAY AS NEEDED FOR MUSCLE RELAXATION. 03/23/16   Fransisca Kaufmann Dettinger, MD  naproxen sodium (ANAPROX) 220 MG tablet Take 440 mg by mouth 2 (two) times daily as needed (pain).     Historical Provider, MD  ondansetron (ZOFRAN) 8 MG tablet Take 1 tablet (8 mg total) by mouth every 8 (eight) hours as needed for nausea or vomiting. 03/09/16   Ladell Pier, MD  oxyCODONE (OXY IR/ROXICODONE) 5 MG immediate release tablet Take 1 tablet (5 mg total) by mouth every 6 (six) hours as needed for severe pain. 03/20/16   Owens Shark, NP  sertraline (ZOLOFT) 100 MG tablet Take 1 tablet (100 mg total) by mouth daily. 03/26/16   Sharion Balloon, FNP  sulfamethoxazole-trimethoprim (BACTRIM DS) 800-160 MG tablet Take 1 tablet by mouth 2 (two) times daily. 03/26/16   Sharion Balloon, FNP    Family History Family History  Problem Relation Age of Onset  . Thyroid disease Mother   . Lung cancer Father     lung  . Cirrhosis Brother     Social History Social History  Substance Use Topics  . Smoking status: Never Smoker  . Smokeless tobacco: Never Used  . Alcohol use 0.0 oz/week     Comment: occ     Allergies   Latex   Review of Systems Review of Systems  Gastrointestinal: Positive for abdominal pain.     Physical Exam Updated Vital Signs BP 124/59 (BP Location: Left Arm)   Pulse 94   Temp 97.9 F (36.6 C) (Oral)   Resp 20   Wt 83.9 kg   LMP  (LMP Unknown) Comment: perimenopausal  SpO2 98%   BMI 33.84 kg/m   Physical Exam  Constitutional: She is oriented to person, place, and time. She appears well-developed and well-nourished.  HENT:  Head: Normocephalic and atraumatic.  Mouth/Throat: Oropharynx is clear and moist.  Eyes: Conjunctivae and EOM are normal. Pupils are equal, round, and reactive to light.  Neck: Normal range of motion.    Cardiovascular: Normal rate, regular rhythm and normal heart sounds.   Pulmonary/Chest: Effort normal and breath sounds normal. No respiratory distress. She has no wheezes.  Abdominal: Soft. Bowel sounds are normal.  Laparoscopic surgical incisions of the abdomen appear well healed Skin surrounding stoma site appears very irritated without overt signs of infection, skin is tender to the touch as expected, light brown stool expelled from stoma, no frank blood in stool  Musculoskeletal: Normal range of motion.  Neurological: She is alert and oriented to person, place,  and time.  Skin: Skin is warm and dry.  Psychiatric: She has a normal mood and affect.  Nursing note and vitals reviewed.    ED Treatments / Results  Labs (all labs ordered are listed, but only abnormal results are displayed) Labs Reviewed - No data to display  EKG  EKG Interpretation None       Radiology No results found.  Procedures Procedures (including critical care time)  Medications Ordered in ED Medications - No data to display   Initial Impression / Assessment and Plan / ED Course  I have reviewed the triage vital signs and the nursing notes.  Pertinent labs & imaging results that were available during my care of the patient were reviewed by me and considered in my medical decision making (see chart for details).  Clinical Course   50 year old female here with leakage from her ostomy bag. This is been a recurrent issue over the past month since she had her ileostomy performed. Skin around her stoma does appear irritated but is not cellulitic.  Stoma has output with light brown stool, no frank blood. Wound care given here, irritation was soothed with topical EMLA cream. She was fitted for a new ostomy bag with larger opening to avoid adhesive being placed directly on the irritated skin. No leakage from new bag while here. Repeat UA today with many bacteria. Her prior urine culture showed insufficient  growth. Her urine does not appear any improved from a few days ago, will change her antibiotics to Keflex. New urine culture sent today. Patient was discharged home with a few of the larger colostomy bags until these can be ordered by her home health wound care nurse.  Follow-up with PCP regarding UTI.  Discussed plan with patient, she acknowledged understanding and agreed with plan of care.  Return precautions given for new or worsening symptoms.  Final Clinical Impressions(s) / ED Diagnoses   Final diagnoses:  Acute cystitis with hematuria  Encounter for ostomy care education    New Prescriptions Discharge Medication List as of 03/29/2016  3:09 PM    START taking these medications   Details  cephALEXin (KEFLEX) 500 MG capsule Take 1 capsule (500 mg total) by mouth 2 (two) times daily., Starting Sun 03/29/2016, Print         Larene Pickett, PA-C 03/29/16 [place], MD 04/01/16 1359

## 2016-03-29 NOTE — Discharge Instructions (Signed)
Take the prescribed medication as directed.  Stop taking the bactrim. Follow-up with your primary care doctor. Return to the ED for new or worsening symptoms.

## 2016-03-29 NOTE — ED Notes (Signed)
PA at bedside.

## 2016-03-29 NOTE — ED Triage Notes (Signed)
Pt reports reddness, soreness, and skin irritation at site of bag. Reports pain shoots to a 10 when it leaks and the  Stomach acid gets to the skin. Hurts when moving to sitting/standing position. Reports when the bag is on, it irritates her skin and leaks.

## 2016-03-30 ENCOUNTER — Other Ambulatory Visit: Payer: Self-pay | Admitting: Family

## 2016-03-30 ENCOUNTER — Emergency Department (HOSPITAL_COMMUNITY): Payer: BLUE CROSS/BLUE SHIELD

## 2016-03-30 ENCOUNTER — Encounter (HOSPITAL_COMMUNITY)
Admission: RE | Admit: 2016-03-30 | Discharge: 2016-03-30 | Disposition: A | Discharge status: Home / Self Care | Payer: BLUE CROSS/BLUE SHIELD | Source: Ambulatory Visit | Attending: Endocrinology | Admitting: Endocrinology

## 2016-03-30 ENCOUNTER — Emergency Department (HOSPITAL_COMMUNITY)
Admission: EM | Admit: 2016-03-30 | Discharge: 2016-03-31 | Disposition: A | Discharge status: Home / Self Care | Payer: BLUE CROSS/BLUE SHIELD | Attending: Emergency Medicine | Admitting: Emergency Medicine

## 2016-03-30 ENCOUNTER — Encounter (HOSPITAL_COMMUNITY): Payer: Self-pay | Admitting: Emergency Medicine

## 2016-03-30 DIAGNOSIS — R1084 Generalized abdominal pain: Secondary | ICD-10-CM

## 2016-03-30 DIAGNOSIS — Z932 Ileostomy status: Secondary | ICD-10-CM

## 2016-03-30 DIAGNOSIS — R197 Diarrhea, unspecified: Secondary | ICD-10-CM | POA: Diagnosis present

## 2016-03-30 DIAGNOSIS — I10 Essential (primary) hypertension: Secondary | ICD-10-CM | POA: Insufficient documentation

## 2016-03-30 DIAGNOSIS — T85518A Breakdown (mechanical) of other gastrointestinal prosthetic devices, implants and grafts, initial encounter: Secondary | ICD-10-CM | POA: Insufficient documentation

## 2016-03-30 DIAGNOSIS — Z79899 Other long term (current) drug therapy: Secondary | ICD-10-CM | POA: Diagnosis not present

## 2016-03-30 DIAGNOSIS — R109 Unspecified abdominal pain: Secondary | ICD-10-CM | POA: Diagnosis not present

## 2016-03-30 DIAGNOSIS — Z432 Encounter for attention to ileostomy: Secondary | ICD-10-CM

## 2016-03-30 DIAGNOSIS — E0590 Thyrotoxicosis, unspecified without thyrotoxic crisis or storm: Secondary | ICD-10-CM | POA: Insufficient documentation

## 2016-03-30 DIAGNOSIS — Y828 Other medical devices associated with adverse incidents: Secondary | ICD-10-CM | POA: Diagnosis not present

## 2016-03-30 LAB — URINALYSIS, COMPLETE
BILIRUBIN UA: NEGATIVE
GLUCOSE, UA: NEGATIVE
Nitrite, UA: POSITIVE — AB
SPEC GRAV UA: 1.025 (ref 1.005–1.030)
Urobilinogen, Ur: 1 mg/dL (ref 0.2–1.0)
pH, UA: 5.5 (ref 5.0–7.5)

## 2016-03-30 LAB — COMPREHENSIVE METABOLIC PANEL
ALBUMIN: 3.5 g/dL (ref 3.5–5.0)
ALT: 16 U/L (ref 14–54)
AST: 22 U/L (ref 15–41)
Alkaline Phosphatase: 67 U/L (ref 38–126)
Anion gap: 6 (ref 5–15)
BILIRUBIN TOTAL: 0.4 mg/dL (ref 0.3–1.2)
BUN: 19 mg/dL (ref 6–20)
CO2: 22 mmol/L (ref 22–32)
Calcium: 8.7 mg/dL — ABNORMAL LOW (ref 8.9–10.3)
Chloride: 102 mmol/L (ref 101–111)
Creatinine, Ser: 1.04 mg/dL — ABNORMAL HIGH (ref 0.44–1.00)
GFR calc Af Amer: >60 mL/min (ref >60)
GFR calc non Af Amer: >60 mL/min (ref >60)
GLUCOSE: 122 mg/dL — AB (ref 65–99)
POTASSIUM: 3.9 mmol/L (ref 3.5–5.1)
Sodium: 130 mmol/L — ABNORMAL LOW (ref 135–145)
Total Protein: 6.9 g/dL (ref 6.5–8.1)

## 2016-03-30 LAB — URINE CULTURE

## 2016-03-30 LAB — MICROSCOPIC EXAMINATION
RBC, UA: >30 /hpf — AB (ref 0–?)
WBC, UA: >30 /hpf — AB (ref 0–?)

## 2016-03-30 LAB — CBC WITH DIFFERENTIAL/PLATELET
Basophils Absolute: 0 10*3/uL (ref 0.0–0.1)
Basophils Relative: 0 %
EOS PCT: 4 %
Eosinophils Absolute: 0.2 10*3/uL (ref 0.0–0.7)
HCT: 30.1 % — ABNORMAL LOW (ref 36.0–46.0)
Hemoglobin: 10.1 g/dL — ABNORMAL LOW (ref 12.0–15.0)
LYMPHS ABS: 0.8 10*3/uL (ref 0.7–4.0)
LYMPHS PCT: 19 %
MCH: 27.2 pg (ref 26.0–34.0)
MCHC: 33.6 g/dL (ref 30.0–36.0)
MCV: 81.1 fL (ref 78.0–100.0)
MONO ABS: 0.6 10*3/uL (ref 0.1–1.0)
Monocytes Relative: 14 %
Neutro Abs: 2.6 10*3/uL (ref 1.7–7.7)
Neutrophils Relative %: 63 %
PLATELETS: 337 10*3/uL (ref 150–400)
RBC: 3.71 MIL/uL — ABNORMAL LOW (ref 3.87–5.11)
RDW: 12.1 % (ref 11.5–15.5)
WBC: 4.2 10*3/uL (ref 4.0–10.5)

## 2016-03-30 MED ORDER — BACITRACIN ZINC 500 UNIT/GM EX OINT
TOPICAL_OINTMENT | CUTANEOUS | Status: AC
  Administered 2016-03-30: 1
  Filled 2016-03-30: qty 0.9

## 2016-03-30 MED ORDER — IOPAMIDOL (ISOVUE-300) INJECTION 61%
100.0000 mL | Freq: Once | INTRAVENOUS | Status: AC | PRN
  Administered 2016-03-30: 100 mL via INTRAVENOUS

## 2016-03-30 MED ORDER — NITROFURANTOIN MONOHYD MACRO 100 MG PO CAPS
100.0000 mg | ORAL_CAPSULE | Freq: Two times a day (BID) | ORAL | 0 refills | Status: DC
Start: 2016-03-30 — End: 2016-05-08

## 2016-03-30 MED ORDER — SODIUM CHLORIDE 0.9 % IV BOLUS (SEPSIS)
500.0000 mL | Freq: Once | INTRAVENOUS | Status: AC
  Administered 2016-03-30: 500 mL via INTRAVENOUS

## 2016-03-30 MED ORDER — MORPHINE SULFATE (PF) 2 MG/ML IV SOLN
4.0000 mg | Freq: Once | INTRAVENOUS | Status: AC
Start: 2016-03-30 — End: 2016-03-30
  Administered 2016-03-30: 4 mg via INTRAVENOUS
  Filled 2016-03-30: qty 2

## 2016-03-30 MED ORDER — IOPAMIDOL (ISOVUE-300) INJECTION 61%: 30.0000 mL | Freq: Once | INTRAVENOUS | Status: DC | PRN

## 2016-03-30 MED ORDER — ONDANSETRON HCL 4 MG/2ML IJ SOLN
4.0000 mg | Freq: Once | INTRAMUSCULAR | Status: AC
  Administered 2016-03-30: 4 mg via INTRAVENOUS
  Filled 2016-03-30: qty 2

## 2016-03-30 MED ORDER — SODIUM IODIDE I 131 CAPSULE
10.8000 | Freq: Once | INTRAVENOUS | Status: DC | PRN
Start: 2016-03-30 — End: 2016-04-05

## 2016-03-30 NOTE — ED Triage Notes (Addendum)
Pt states her ileostomy bag started leaking today and she also began having diarrhea from her rectum; pt's clothes are saturated; pt states the leaking of ileostomy bag is constant issue; area surrounding stoma is reddened and irritated; hx of rectal cancer  Ileostomy bag changed; green stool noted with dark red stool present beneath.

## 2016-03-30 NOTE — ED Provider Notes (Signed)
[facility] DEPT Provider Note   CSN: [id number] Arrival date & time: 03/30/16  2124 By signing my name below, I, Dyke Brackett, attest that this documentation has been prepared under the direction and in the presence of Orpah Greek, MD . Electronically Signed: Dyke Brackett, Scribe. 03/30/2016. 11:32 PM.   History   Chief Complaint Chief Complaint  Patient presents with  . Stoma Irritation  . Diarrhea    HPI CHRISTINEJOY CAMMARANO is a 50 y.o. female with hx of rectal cancer who presents to the Emergency Department complaining of intermittent diarrhea from her rectum onset today. Pt states her Ilestomy bag is also leaking which is a constant issue for her. She notes associated nausea, abdominal pain, and hematochezia. Pt describes her pain as burning and rates it 10/10 in severity. No alleviating or modifying factors noted.  She states there was some dark red stool present in her ileostomy bag, but denies any rectal bleeding.   The history is provided by the patient. No language interpreter was used.    Past Medical History:  Diagnosis Date  . Cancer ([place]) 10/29/15   rectal invasice adenocarcinoma   . Chronic back pain   . Depression   . Hypertension   . Hyperthyroidism   . Rectal cancer ([place])    Adenocarcinoma dx 10/29/15; Stage III, T3, N1, MO    Patient Active Problem List   Diagnosis Date Noted  . Rectal cancer ([place]) 02/26/2016  . Malignant neoplasm of rectum ([place]) 11/17/2015  . Anemia 10/01/2015  . Essential hypertension, benign 09/02/2015  . Obesity 09/02/2015  . Depression 05/02/2015  . Hyperthyroidism 02/27/2015  . Closed fracture of nasal bone 02/18/2015    Past Surgical History:  Procedure Laterality Date  . ABDOMINAL HYSTERECTOMY     vaginal   . BLADDER SURGERY    . EYE SURGERY    . ILEOSTOMY    . URETERAL REIMPLANTION Left 02/26/2016   Procedure: left ureteral neocystotomy,double J stent placement, closure vaginal cuff;  Surgeon: Ardis Hughs,  MD;  Location: [facility];  Service: Urology;  Laterality: Left;  Marland Kitchen VAGINAL HYSTERECTOMY    . XI ROBOTIC ASSISTED LOWER ANTERIOR RESECTION N/A 02/26/2016   Procedure: XI ROBOTIC ASSISTED LOWER ANTERIOR RESECTION, diverting LOOP ILEOSTOMY, mobilization splenic flexure, omentopexy;  Surgeon: Leighton Ruff, MD;  Location: [facility];  Service: General;  Laterality: N/A;    OB History    No data available       Home Medications    Prior to Admission medications   Medication Sig Start Date End Date Taking? Authorizing Provider  Ascorbic Acid (VITAMIN C PO) Take 1 tablet by mouth daily.   Yes Historical Provider, MD  cephALEXin (KEFLEX) 500 MG capsule Take 1 capsule (500 mg total) by mouth 2 (two) times daily. 03/29/16  Yes Larene Pickett, PA-C  feeding supplement, ENSURE ENLIVE, (ENSURE ENLIVE) LIQD Take 237 mLs by mouth 2 (two) times daily between meals. [id number]  Yes Leighton Ruff, MD  ferrous sulfate 325 (65 FE) MG tablet Take 325 mg by mouth daily with breakfast.   Yes Historical Provider, MD  gabapentin (NEURONTIN) 300 MG capsule 1 CAPSULE TODAY, TWICE A DAY FRIDAY, THEN 3 TIMES A DAY FOR 30 DAYS Patient taking differently: Take one tablet by mouth twice daily. 02/20/16  Yes Fransisca Kaufmann Dettinger, MD  LORazepam (ATIVAN) 0.5 MG tablet TAKE  (1)  TABLET TWICE A DAY. Patient taking differently: Take 0.5mg s at bedtime 08/26/15  Yes Worthy Rancher, MD  methocarbamol (ROBAXIN) 500 MG tablet TAKE (2) TABLETS 4 TIMES A DAY AS NEEDED FOR MUSCLE RELAXATION. 03/23/16  Yes Fransisca Kaufmann Dettinger, MD  oxyCODONE (OXY IR/ROXICODONE) 5 MG immediate release tablet Take 1 tablet (5 mg total) by mouth every 6 (six) hours as needed for severe pain. 03/20/16  Yes Owens Shark, NP  sertraline (ZOLOFT) 100 MG tablet Take 1 tablet (100 mg total) by mouth daily. 03/26/16  Yes Sharion Balloon, FNP  sulfamethoxazole-trimethoprim (BACTRIM DS,SEPTRA DS) 800-160 MG tablet Take 1 tablet by mouth 2 (two) times daily. ABT Start Date  03/26/16 & End Date 03/31/16/ 03/26/16  Yes Historical Provider, MD  acetaminophen (TYLENOL) 500 MG tablet Take 1,000 mg by mouth every 8 (eight) hours as needed for moderate pain.    Historical Provider, MD  ibuprofen (ADVIL,MOTRIN) 200 MG tablet Take 400 mg by mouth every 6 (six) hours as needed for fever.    Historical Provider, MD  lidocaine-prilocaine (EMLA) cream Apply 1 application topically as needed. 03/29/16   Larene Pickett, PA-C  nitrofurantoin, macrocrystal-monohydrate, (MACROBID) 100 MG capsule Take 1 capsule (100 mg total) by mouth 2 (two) times daily. 1 po BId 03/30/16   Sharion Balloon, FNP  ondansetron (ZOFRAN) 8 MG tablet Take 1 tablet (8 mg total) by mouth every 8 (eight) hours as needed for nausea or vomiting. 03/09/16   Ladell Pier, MD    Family History Family History  Problem Relation Age of Onset  . Thyroid disease Mother   . Lung cancer Father     lung  . Cirrhosis Brother     Social History Social History  Substance Use Topics  . Smoking status: Never Smoker  . Smokeless tobacco: Never Used  . Alcohol use No     Comment: occ    Allergies   Latex  Review of Systems Review of Systems 10 systems reviewed and all are negative for acute change except as noted in the HPI.  Physical Exam Updated Vital Signs BP (!) 112/51 (BP Location: Right Arm)   Pulse 94   Temp 97.8 F (36.6 C) (Oral)   Resp 19   LMP  (LMP Unknown) Comment: perimenopausal  SpO2 94%   Physical Exam  Constitutional: She is oriented to person, place, and time. She appears well-developed and well-nourished. No distress.  HENT:  Head: Normocephalic and atraumatic.  Right Ear: Hearing normal.  Left Ear: Hearing normal.  Nose: Nose normal.  Mouth/Throat: Oropharynx is clear and moist and mucous membranes are normal.  Eyes: Conjunctivae and EOM are normal. Pupils are equal, round, and reactive to light.  Neck: Normal range of motion. Neck supple.  Cardiovascular: Regular rhythm, S1  normal and S2 normal.  Exam reveals no gallop and no friction rub.   No murmur heard. Pulmonary/Chest: Effort normal and breath sounds normal. No respiratory distress. She exhibits no tenderness.  Abdominal: Soft. Normal appearance and bowel sounds are normal. There is no hepatosplenomegaly. There is tenderness (diffuse). There is no rebound, no guarding, no tenderness at McBurney's point and negative Murphy's sign. No hernia.  Right mid abdomen stoma with small amount of bleeding; no stool in bag   Musculoskeletal: Normal range of motion.  Neurological: She is alert and oriented to person, place, and time. She has normal strength. No cranial nerve deficit or sensory deficit. Coordination normal. GCS eye subscore is 4. GCS verbal subscore is 5. GCS motor subscore is 6.  Skin: Skin is warm, dry and intact. No rash noted. No  cyanosis.  Psychiatric: She has a normal mood and affect. Her speech is normal and behavior is normal. Thought content normal.  Nursing note and vitals reviewed.  ED Treatments / Results  DIAGNOSTIC STUDIES:  Oxygen Saturation is 100% on RA, normal by my interpretation.    COORDINATION OF CARE:  1:28 AM Discussed treatment plan with pt at bedside and pt agreed to plan.  Labs (all labs ordered are listed, but only abnormal results are displayed) Labs Reviewed  COMPREHENSIVE METABOLIC PANEL - Abnormal; Notable for the following:       Result Value   Sodium 130 (*)    Glucose, Bld 122 (*)    Creatinine, Ser 1.04 (*)    Calcium 8.7 (*)    All other components within normal limits  CBC WITH DIFFERENTIAL/PLATELET - Abnormal; Notable for the following:    RBC 3.71 (*)    Hemoglobin 10.1 (*)    HCT 30.1 (*)    All other components within normal limits  URINALYSIS, ROUTINE W REFLEX MICROSCOPIC (NOT AT [facility]) - Abnormal; Notable for the following:    Color, Urine RED (*)    APPearance TURBID (*)    Hgb urine dipstick LARGE (*)    Protein, ur 100 (*)    Leukocytes, UA  MODERATE (*)    All other components within normal limits  URINE MICROSCOPIC-ADD ON - Abnormal; Notable for the following:    Squamous Epithelial / LPF 0-5 (*)    Bacteria, UA MANY (*)    Casts HYALINE CASTS (*)    All other components within normal limits    EKG  EKG Interpretation None       Radiology Ct Abdomen Pelvis W Contrast  Result Date: 03/31/2016 CLINICAL DATA:  50 y/o F; history of rectal cancer with intermittent diarrhea from the rectum with onset today. Status post ileostomy. EXAM: CT ABDOMEN AND PELVIS WITH CONTRAST TECHNIQUE: Multidetector CT imaging of the abdomen and pelvis was performed using the standard protocol following bolus administration of intravenous contrast. CONTRAST:  151mL ISOVUE-300 IOPAMIDOL (ISOVUE-300) INJECTION 61% COMPARISON:  02/03/2016 CT of abdomen and pelvis. 11/01/2015 CT chest. FINDINGS: Lower chest: Stable 4 mm right middle lobe nodule (series 3, image 1). Hepatobiliary: No focal liver abnormality is seen. No gallstones, gallbladder wall thickening, or biliary dilatation. Pancreas: Unremarkable. No pancreatic ductal dilatation or surrounding inflammatory changes. Spleen: Borderline splenomegaly. Adrenals/Urinary Tract: Normal adrenal glands. No focal renal lesion or hydronephrosis. Left ureteral stent with pigtails in the right renal pelvis and within the bladder. Mild nonspecific stranding around the proximal left ureter. Stomach/Bowel: Postsurgical changes in rectal region consistent with a lower anterior resection. Right lower quadrant loop ileostomy. No significant obstructive or inflammatory changes of the bowel. In Vascular/Lymphatic: No significant vascular findings are present. No enlarged abdominal or pelvic lymph nodes. Reproductive: Status post hysterectomy. No adnexal masses. Other: Postsurgical changes within the anterior abdominal wall relative to a left lower quadrant loop ileostomy and a old transverse midline lower abdominal incision.  Musculoskeletal: No acute or significant osseous findings. IMPRESSION: 1. No inflammatory or obstructive changes of the bowel post right lower quadrant loop ileostomy and low anterior resection. 2. Left-sided ureteral stent with mild inflammatory changes along the proximal ureter may represent infection of the urinary tract. Electronically Signed   By: Kristine Garbe M.D.   On: 03/31/2016 00:40    Procedures Procedures (including critical care time)  Medications Ordered in ED Medications  iopamidol (ISOVUE-300) 61 % injection 30 mL (not administered)  bacitracin 500 UNIT/GM ointment (1 application  Given [id number])  morphine 2 MG/ML injection 4 mg (4 mg Intravenous Given 03/30/16 2350)  ondansetron (ZOFRAN) injection 4 mg (4 mg Intravenous Given 03/30/16 2350)  sodium chloride 0.9 % bolus 500 mL (500 mLs Intravenous New Bag/Given 03/30/16 2349)  iopamidol (ISOVUE-300) 61 % injection 100 mL (100 mLs Intravenous Contrast Given 03/30/16 2358)     Initial Impression / Assessment and Plan / ED Course  I have reviewed the triage vital signs and the nursing notes.  Pertinent labs & imaging results that were available during my care of the patient were reviewed by me and considered in my medical decision making (see chart for details).  Clinical Course    Patient presents to the [facility] with multiple complaints. Patient has been seen in the ER multiple times for complaints of leaking ileostomy bag. Despite multiple outpatient resources she continues to come to the ER when her bag leaks. She does have some irritation of the stoma with a small amount of bleeding, but no signs of cellulitis of the abdominal wall or other deeper infections.  That she had a large amount of rectal seepage earlier tonight. This has ceased and is no longer currently present. A CT scan was performed to further evaluate these problems. No intra-abdominal abnormality was noted.  Patient recently diagnosed  with urinary tract infection. CT scan did show some inflammation in the area of the urinary stent that could be consistent with infection. Patient does not appear to be any obstruction at this time. Patient does not have any evidence of sepsis. Urinalysis is improved from the last one taken a couple of days ago. Patient appears to be having some response to the oral antibiotics. Culture from October 26 grew staph epidermidis, likely contaminant. Urine culture from 1029 did not grow any pathogens. No changes necessary at this time. Follow-up with urology.  Final Clinical Impressions(s) / ED Diagnoses   Final diagnoses:  Ileostomy bag changed ([place])  Generalized abdominal pain    New Prescriptions Current Discharge Medication List    I personally performed the services described in this documentation, which was scribed in my presence. The recorded information has been reviewed and is accurate.    Orpah Greek, MD 03/31/16 [phone]

## 2016-03-31 ENCOUNTER — Encounter (HOSPITAL_COMMUNITY)
Admission: RE | Admit: 2016-03-31 | Discharge: 2016-03-31 | Disposition: A | Discharge status: Home / Self Care | Payer: BLUE CROSS/BLUE SHIELD | Source: Ambulatory Visit | Attending: Endocrinology | Admitting: Endocrinology

## 2016-03-31 ENCOUNTER — Encounter: Payer: Self-pay | Admitting: *Deleted

## 2016-03-31 DIAGNOSIS — E0590 Thyrotoxicosis, unspecified without thyrotoxic crisis or storm: Secondary | ICD-10-CM | POA: Diagnosis present

## 2016-03-31 LAB — URINALYSIS, ROUTINE W REFLEX MICROSCOPIC
BILIRUBIN URINE: NEGATIVE
GLUCOSE, UA: NEGATIVE mg/dL
Ketones, ur: NEGATIVE mg/dL
Nitrite: NEGATIVE
PROTEIN: 100 mg/dL — AB
SPECIFIC GRAVITY, URINE: 1.017 (ref 1.005–1.030)
pH: 5.5 (ref 5.0–8.0)

## 2016-03-31 LAB — URINE MICROSCOPIC-ADD ON

## 2016-03-31 MED ORDER — SODIUM PERTECHNETATE TC 99M INJECTION
9.9000 | Freq: Once | INTRAVENOUS | Status: DC | PRN
Start: 2016-03-31 — End: 2016-04-06

## 2016-03-31 NOTE — Progress Notes (Signed)
Oncology Nurse Navigator Documentation  Oncology Nurse Navigator Flowsheets 03/31/2016  Navigator Location [facility]  Referral date to RadOnc/MedOnc -  Navigator Encounter Type Other--spoke with patient in radiology department regarding the leaking issue she is having with her ileostomy.  Telephone -  Abnormal Finding Date -  Confirmed Diagnosis Date -  Surgery Date -  Treatment Initiated Date -  Patient Visit Type Other-radiology  Treatment Phase -  Barriers/Navigation Needs Education  Education Other--contacts for ostomy care  Interventions  Other--provided her with phone # to contact [facility] at [facility] as well as contact information for [facility] headed by Renaye Rakers [phone]    Referrals -  Coordination of Care -  Education Method -  Support Groups/Services Supportive listening and made patient aware that navigator wants to help her, but this is above scope of practice.  Specialty Items/DME Ostomy supplies  Acuity Level 2  Time Spent with Patient 30  Called by Dr. Marcello Moores regarding significant difficulty she is having with her ileostomy fitting and leakage. She has seen [employer] [facility] before as well is familiar with the inpatient nurse, Margarita Grizzle. Unable to reach Margarita Grizzle, and was informed by Darcel Smalling nurse, Melody that she is in a conference all day. [facility] nurses are not available--not seeing outpatients any longer due to hospital needs. Unable to speak with Tye Maryland at [facility] message with a staff member there and requested return call.

## 2016-03-31 NOTE — Progress Notes (Signed)
Destiny [name] called back to report the phone # for [facility] nurse was no longer a working number. Navigator called and confirmed this. Called [facility] and was given the [phone] of the Destiny [name]. The ostomy nurse is an inpatient nurse that sees patients there in clinic as long as there is a referral. Destiny [name] will speak with nurse, Destiny [name] to determine the 1st available she has to see her.  Called Destiny [name] back to make her aware what navigator is trying to accomplish. She is tearful and tells RN she changed her bag #6 times on Saturday and already #3 today. Asking if she can be seen today by Dr. Benay Spice or Lattie Haw? Says "I'm not coming in there on Thursday". Informed her both are not able add another patient today. Informed her lets keep the appointment for now and navigator will call her Thursday am to see how she is feeling before the appointment is cancelled. She says she just wants to die. Reminded her that this ostomy is only temporary---try to focus on this and I will do everything I can to help her. She verbalizes that she does not feel she can go through this for 3 months.

## 2016-04-01 ENCOUNTER — Telehealth: Payer: Self-pay | Admitting: *Deleted

## 2016-04-01 NOTE — Telephone Encounter (Signed)
Oncology Nurse Navigator Documentation  Oncology Nurse Navigator Flowsheets 04/01/2016  Navigator Location [facility]  Referral date to RadOnc/MedOnc -  Navigator Encounter Type Telephone  Telephone Incoming Call;Outgoing Call  Abnormal Finding Date 10/29/2015  Confirmed Diagnosis Date 10/29/2015  Surgery Date 02/26/2016  [facility] Date 11/15/2015  Treatment Initiated Date 11/25/2015  Patient Visit Type -  Treatment Phase -  Barriers/Navigation Needs Coordination of Care  Education -  Interventions Coordination of Care;Referrals to [facility]-faxed referral as requested by nurse, Colletta Maryland. The ostomy nurse is out of office until 04/06/16.  Referrals -  Coordination of Care Appts--she declined appointment today w/[name] Propst at [facility] due to being out of ostomy bags and home health nurse was going to bring some out today at 11:00. Rescheduled her to see Tye Maryland on 11/2 at 10:00 and she agrees to this appointment  Education Method -  Support Groups/Services -  Specialty Items/DME -  Acuity Level 2  Time Spent with Patient 30  Rescheduled lab/OV on 11/2 earlier in day after her consult with ostomy nurse at [facility].

## 2016-04-02 ENCOUNTER — Telehealth: Payer: Self-pay | Admitting: Nurse Practitioner

## 2016-04-02 ENCOUNTER — Ambulatory Visit (HOSPITAL_BASED_OUTPATIENT_CLINIC_OR_DEPARTMENT_OTHER): Payer: BLUE CROSS/BLUE SHIELD | Admitting: Nurse Practitioner

## 2016-04-02 ENCOUNTER — Other Ambulatory Visit (HOSPITAL_BASED_OUTPATIENT_CLINIC_OR_DEPARTMENT_OTHER): Payer: BLUE CROSS/BLUE SHIELD

## 2016-04-02 VITALS — BP 126/52 | HR 85 | Temp 98.6°F | Resp 18 | Ht 62.0 in | Wt 185.7 lb

## 2016-04-02 DIAGNOSIS — C20 Malignant neoplasm of rectum: Secondary | ICD-10-CM

## 2016-04-02 DIAGNOSIS — R110 Nausea: Secondary | ICD-10-CM

## 2016-04-02 DIAGNOSIS — Z452 Encounter for adjustment and management of vascular access device: Secondary | ICD-10-CM

## 2016-04-02 DIAGNOSIS — I10 Essential (primary) hypertension: Secondary | ICD-10-CM

## 2016-04-02 LAB — COMPREHENSIVE METABOLIC PANEL
ALT: 13 U/L (ref 0–55)
AST: 19 U/L (ref 5–34)
Albumin: 2.9 g/dL — ABNORMAL LOW (ref 3.5–5.0)
Alkaline Phosphatase: 83 U/L (ref 40–150)
Anion Gap: 6 mEq/L (ref 3–11)
BUN: 14.5 mg/dL (ref 7.0–26.0)
CALCIUM: 9.5 mg/dL (ref 8.4–10.4)
CHLORIDE: 107 meq/L (ref 98–109)
CO2: 22 mEq/L (ref 22–29)
Creatinine: 0.7 mg/dL (ref 0.6–1.1)
EGFR: >90 mL/min/{1.73_m2} (ref >90)
Glucose: 136 mg/dl (ref 70–140)
POTASSIUM: 4.2 meq/L (ref 3.5–5.1)
SODIUM: 135 meq/L — AB (ref 136–145)
Total Bilirubin: 0.26 mg/dL (ref 0.20–1.20)
Total Protein: 6.8 g/dL (ref 6.4–8.3)

## 2016-04-02 LAB — CBC WITH DIFFERENTIAL/PLATELET
BASO%: 0.1 % (ref 0.0–2.0)
BASOS ABS: 0 10*3/uL (ref 0.0–0.1)
EOS%: 4.8 % (ref 0.0–7.0)
Eosinophils Absolute: 0.2 10*3/uL (ref 0.0–0.5)
HEMATOCRIT: 30.2 % — AB (ref 34.8–46.6)
HGB: 10.2 g/dL — ABNORMAL LOW (ref 11.6–15.9)
LYMPH%: 10.4 % — AB (ref 14.0–49.7)
MCH: 27.7 pg (ref 25.1–34.0)
MCHC: 33.9 g/dL (ref 31.5–36.0)
MCV: 81.7 fL (ref 79.5–101.0)
MONO#: 0.6 10*3/uL (ref 0.1–0.9)
MONO%: 12 % (ref 0.0–14.0)
NEUT#: 3.3 10*3/uL (ref 1.5–6.5)
NEUT%: 72.7 % (ref 38.4–76.8)
Platelets: 316 10*3/uL (ref 145–400)
RBC: 3.69 10*6/uL — AB (ref 3.70–5.45)
RDW: 12.7 % (ref 11.2–14.5)
WBC: 4.6 10*3/uL (ref 3.9–10.3)
lymph#: 0.5 10*3/uL — ABNORMAL LOW (ref 0.9–3.3)

## 2016-04-02 MED ORDER — SODIUM CHLORIDE 0.9% FLUSH
10.0000 mL | INTRAVENOUS | Status: DC | PRN
  Administered 2016-04-02: 10 mL via INTRAVENOUS
  Filled 2016-04-02: qty 10

## 2016-04-02 MED ORDER — OXYCODONE HCL 5 MG PO TABS: 5.0000 mg | ORAL_TABLET | Freq: Three times a day (TID) | ORAL | 0 refills | Status: DC | PRN

## 2016-04-02 MED ORDER — FLUCONAZOLE 100 MG PO TABS: 100.0000 mg | ORAL_TABLET | Freq: Every day | ORAL | 0 refills | Status: DC

## 2016-04-02 MED ORDER — HEPARIN SOD (PORK) LOCK FLUSH 100 UNIT/ML IV SOLN
500.0000 [IU] | Freq: Once | INTRAVENOUS | Status: AC
  Administered 2016-04-02: 250 [IU] via INTRAVENOUS
  Filled 2016-04-02: qty 5

## 2016-04-02 NOTE — Telephone Encounter (Signed)
Appointments scheduled per 11/2 LOS. Patient given AVS report and calendars with future scheduled appointments °

## 2016-04-02 NOTE — Progress Notes (Signed)
[facility] OFFICE PROGRESS NOTE   Diagnosis:  Rectal cancer  INTERVAL HISTORY:   Destiny [name] returns as scheduled. Destiny [name] was seen in the emergency department 03/21/2016 with pain and leakage at the site of the ostomy. Destiny [name] was seen in the emergency department on 03/30/2016 with diarrhea rectally and continued leakage from the ostomy bag.  Destiny [name] reports a new wafer and bag were placed by the home care nurse yesterday. Destiny [name] has had no leakage since. Stools are partially formed. Destiny [name] is taking any antidiarrheal 3 times a day. Destiny [name] continues to have pain around the ostomy. Destiny [name] is taking oxycodone 1-2 times a day. Destiny [name] has mild intermittent nausea. Destiny [name] reports recent hematuria, UTI. Destiny [name] began a course of Keflex earlier this week. Destiny [name] is no longer receiving IV fluids. Appetite has improved.  Objective:  Vital signs in last 24 hours:  Blood pressure (!) 126/52, pulse 85, temperature 98.6 F (37 C), temperature source Oral, resp. rate 18, height '5\' 2"'  (1.575 m), weight 185 lb 11.2 oz (84.2 kg), SpO2 100 %.    HEENT: No thrush or ulcers. Resp: Lungs clear bilaterally. Cardio: Regular rate and rhythm. GI: Abdomen is soft. Mild diffuse tenderness. Right abdomen ileostomy with partially formed stool in the collection bag. Vascular: No leg edema. Right upper extremity PICC with no erythema.   Lab Results:  Lab Results  Component Value Date   WBC 4.6 04/02/2016   HGB 10.2 (L) 04/02/2016   HCT 30.2 (L) 04/02/2016   MCV 81.7 04/02/2016   PLT 316 04/02/2016   NEUTROABS 3.3 04/02/2016    Imaging:  Nm Thyroid Sng Uptake W/imaging  Result Date: 03/31/2016 CLINICAL DATA:  Hyperthyroidism.  TSH equal 0.06 EXAM: THYROID SCAN AND UPTAKE - 24 HOURS TECHNIQUE: Following the per oral administration of I-131 sodium iodide, the patient returned at 24 hours and uptake measurements were acquired with the uptake probe centered on the neck. Thyroid imaging was performed following the intravenous  administration of the Tc-73mPertechnetate. RADIOPHARMACEUTICALS:  10.8 MicroCuries I-131 sodium iodide orally and 9.9 mCi Technetium-924mertechnetate IV COMPARISON:  None FINDINGS: Uniform uptake within thyroid gland without nodularity. The gland is enlarged. 24 hour I 131 uptake = 60.3% (normal 10-30%) IMPRESSION: Imaging findings and I 131 uptake are consistent with Graves disease. Electronically Signed   By: StSuzy Bouchard.D.   On: 03/31/2016 15:51    Medications: I have reviewed the patient's current medications.  Assessment/Plan:  1. Rectal cancer-clinical stage III (T3 N1)  No loss of mismatch repair protein expression  T3 N1 tumor beginning at 2 cm from the anal verge on an MRI of the pelvis 11/08/2015  CTs of the chest, abdomen, and pelvis 11/01/2015 with no evidence of distant metastatic disease  Initiation of neoadjuvant radiation/Xeloda 11/25/2015; completion of radiation/Xeloda 01/02/2016  Low anterior resection with a coloanal anastomosis 02/26/2016,ypT2,ypN0. Grade 3 adenocarcinoma, negative resection margins 02/26/2016  2. History of hypertension  3. Left ureteral injury at the low anterior resection-status post a left ureteral neocystostomy and left double-J stent placement 02/26/2016   Disposition: Destiny [name] stable. Destiny [name] will continue close follow-up with the ostomy nurse. Dr. ShBenay Spiceecommends initiation of adjuvant Xeloda. We again reviewed potential toxicities associated with Xeloda including nausea, mouth sores, diarrhea, hand-foot syndrome. Destiny [name] is agreeable to proceed. Destiny [name] reports good tolerance of Xeloda in the past. Plan to begin cycle 1 on 04/06/2016. We will see her in follow-up on 04/14/2016. Destiny [name] will contact the office in the interim with any  problems. We specifically discussed diarrhea.  Destiny [name] will follow-up with urology if the hematuria persists following treatment of the UTI.  Plan reviewed with Dr. Benay Spice.    Ned Card ANP/GNP-BC    04/02/2016  12:36 PM

## 2016-04-08 ENCOUNTER — Telehealth: Payer: Self-pay | Admitting: *Deleted

## 2016-04-08 ENCOUNTER — Telehealth: Payer: Self-pay | Admitting: Pharmacist

## 2016-04-08 NOTE — Telephone Encounter (Signed)
  Oncology Nurse Navigator Documentation  Navigator Location: [facility] (04/08/16 1352)   )Navigator Encounter Type: Telephone (04/08/16 1352) Telephone: Lahoma Crocker Call;Appt Confirmation/Clarification (04/08/16 1352)   Called to follow up on status of referral to wound/ostomy nurse. Per Colletta Maryland, the nurse is waiting to get her schedule for the hospital week and will then know when she can see Destiny [name]. They have already reached out to patient to let her know the current status.

## 2016-04-08 NOTE — Telephone Encounter (Signed)
Message received this AM from patient stating that she has not received Xeloda prescription and would like to know when she will receive it.  Call placed back to patient and patient notified per Evelena Peat in Pharmacy that Xeloda could take up to one week until it is received and that we will reschedule her appt on 04/14/16 after she has received her Xeloda.  Patient appreciative of call and has no further questions at this time.

## 2016-04-08 NOTE — Telephone Encounter (Addendum)
Oral Chemotherapy Pharmacist Encounter  Received prescription for Xeloda for patient for adjuvant treatment of rectal cancer. Noted patient had taken Xeloda in the recent past with XRT, precsription filled at that time through [place]. I will completed prescription referral form for [facility] and fax it to them along with copy of patient's insurance card and last provider office note.  Labs from 04/02/16 reviewed, OK for treatment Current medication list in Epic assessed, some DDIs with Xeloda identified: Xeloda and Bactrim: risk category D, Xeloda will inhibit metabolism of Bactrim and could result in increased exposure to the Bactrim. Bactrim is prescribed for a short course with both cephalexin and nitrofurantoin being prescribed after. All antibiotic courses will be completed prior to the start of Xeloda.  Oral Chemo Clinic will continue to follow.  Johny Drilling, PharmD, BCPS 04/08/2016  11:44 AM Oral Chemotherapy Clinic [phone]

## 2016-04-09 ENCOUNTER — Telehealth: Payer: Self-pay | Admitting: *Deleted

## 2016-04-09 NOTE — Telephone Encounter (Signed)
Oncology Nurse Navigator Documentation  Oncology Nurse Navigator Flowsheets 04/09/2016  Navigator Location [facility]  Referral date to RadOnc/MedOnc -  Navigator Encounter Type Telephone  Telephone Outgoing Call;Patient Update  Abnormal Finding Date -  Confirmed Diagnosis Date -  Surgery Date -  Multidisiplinary Clinic Date -  Treatment Initiated Date -  Patient Visit Type -  Treatment Phase -  Barriers/Navigation Needs Coordination of Care;Education  Education -  Interventions Other--provided phone # of navigator for her to call when she receives her Xeloda. Made her aware that ostomy nurse at [facility] is out the rest of the week and possibly next week with an unexpected injury.  Referrals -  Coordination of Care -  Education Method -  Support Groups/Services -  Specialty Items/DME -  Acuity -  Time Spent with Patient 15  Notified by Colletta Maryland at [facility]: ostomy nurse is out the rest of this week and possibly next due to unexpected injury.  Destiny [name] reports she is changing her bag 1-2/day due to leakage.

## 2016-04-13 ENCOUNTER — Telehealth: Payer: Self-pay | Admitting: Pharmacist

## 2016-04-13 NOTE — Telephone Encounter (Signed)
I s/w pt over phone today after I called [facility]. The Xeloda is ready to be shipped out from [facility] to the pt.  Pt was given ph# to [facility] [phone]) to call today and confirm delivery address, etc.   [facility] will deliver Xeloda tomorrow and pt plans to begin tx tomorrow (11/14). Pt recalls having diarrhea in past w/ Xeloda.  She is using Imodium presently to manage liquid stools.  She understands to call our office if worsens when she starts Xeloda. I reminded pt she will take 4 tabs in AM/3 tabs in PM 30 min after meals.  She understands she will take for 14 days "on" and 7 days "off."  We will touch base w/ pt ~10-14 days for tolerance/compliance. I gave pt our phone # to oral chemo clinic if she has problems or questions. Next appt 11/24 at 11:30 pm for lab; 12:15 pm w/ Ned Card, NP.  Kennith Center, Pharm.D., CPP 04/13/2016@10 :00 AM Oral Chemo Clinic

## 2016-04-14 ENCOUNTER — Ambulatory Visit: Payer: BLUE CROSS/BLUE SHIELD | Admitting: Nurse Practitioner

## 2016-04-14 ENCOUNTER — Other Ambulatory Visit: Payer: BLUE CROSS/BLUE SHIELD

## 2016-04-15 NOTE — Progress Notes (Signed)
Counseling intern called patient and explained role as member of care team who offers emotional support to patients. When counselor asked patient about her current emotional state, patient expressed frustration with all the changes that have occurred as the result of the cancer diagnosis and the disruption to her normal routines. She noted that her grandchildren are a bright spot in her life.   Counselor and patient agreed to meet on the day of her next hospital appointment, November 24 at 11:00 am to allow patient space to talk more about her emotional state.  Lamount Cohen, Counseling Intern Department for [facility] and [facility] [address] [place], [place]

## 2016-04-18 ENCOUNTER — Other Ambulatory Visit: Payer: Self-pay | Admitting: Family Medicine

## 2016-04-18 DIAGNOSIS — M545 Low back pain, unspecified: Secondary | ICD-10-CM

## 2016-04-21 ENCOUNTER — Telehealth: Payer: Self-pay | Admitting: Endocrinology

## 2016-04-21 DIAGNOSIS — E0590 Thyrotoxicosis, unspecified without thyrotoxic crisis or storm: Secondary | ICD-10-CM

## 2016-04-21 MED ORDER — METOPROLOL SUCCINATE ER 25 MG PO TB24: 25.0000 mg | ORAL_TABLET | Freq: Every day | ORAL | 3 refills | Status: DC

## 2016-04-21 NOTE — Telephone Encounter (Signed)
I ordered.  you will receive a phone call, about a day and time for an appointment 

## 2016-04-21 NOTE — Telephone Encounter (Signed)
Patient called and wanted to know what her next steps would be concerning her thyroid medication. Patient had her her thyroid uptaking scan on 03/30/2016, but has not been schedule for the RAI treatment. Could you review and please advise? Thanks!

## 2016-04-21 NOTE — Telephone Encounter (Signed)
Patient wanted to know if she should start her medication back during this time because she had been experiencing shakiness?

## 2016-04-21 NOTE — Telephone Encounter (Signed)
Requested a call back from the patient to further discuss.  

## 2016-04-21 NOTE — Telephone Encounter (Signed)
Please hold off on the methimazole.  However, I have sent a prescription to your pharmacy, to help the symptoms.  It is a once a day, slow-release, rather than as needed.

## 2016-04-21 NOTE — Telephone Encounter (Signed)
Pt called and said that ever since she has been off of her medication that she has been shaky, she wants to speak with Dr. Cordelia Pen nurse.

## 2016-04-22 ENCOUNTER — Other Ambulatory Visit: Payer: Self-pay | Admitting: *Deleted

## 2016-04-22 MED ORDER — LOPERAMIDE HCL 2 MG PO CAPS: 2.0000 mg | ORAL_CAPSULE | Freq: Three times a day (TID) | ORAL | 0 refills | Status: DC | PRN

## 2016-04-22 NOTE — Telephone Encounter (Signed)
I contacted the patient and advised of message. Patient voiced understanding. Neoma Laming, this patient has reached her deductible for year and needs to have the RAI completed before the start of the new year. Thanks!

## 2016-04-24 ENCOUNTER — Telehealth: Payer: Self-pay | Admitting: Oncology

## 2016-04-24 ENCOUNTER — Other Ambulatory Visit (HOSPITAL_BASED_OUTPATIENT_CLINIC_OR_DEPARTMENT_OTHER): Payer: BLUE CROSS/BLUE SHIELD

## 2016-04-24 ENCOUNTER — Telehealth: Payer: Self-pay | Admitting: Nurse Practitioner

## 2016-04-24 ENCOUNTER — Ambulatory Visit (HOSPITAL_BASED_OUTPATIENT_CLINIC_OR_DEPARTMENT_OTHER): Payer: BLUE CROSS/BLUE SHIELD | Admitting: Nurse Practitioner

## 2016-04-24 VITALS — BP 117/40 | HR 103 | Temp 97.8°F | Resp 18 | Ht 62.0 in | Wt 185.5 lb

## 2016-04-24 DIAGNOSIS — C20 Malignant neoplasm of rectum: Secondary | ICD-10-CM

## 2016-04-24 DIAGNOSIS — I10 Essential (primary) hypertension: Secondary | ICD-10-CM | POA: Diagnosis not present

## 2016-04-24 LAB — COMPREHENSIVE METABOLIC PANEL
ALBUMIN: 2.9 g/dL — AB (ref 3.5–5.0)
ALK PHOS: 75 U/L (ref 40–150)
ALT: 10 U/L (ref 0–55)
ANION GAP: 9 meq/L (ref 3–11)
AST: 15 U/L (ref 5–34)
BUN: 10.4 mg/dL (ref 7.0–26.0)
CALCIUM: 9.6 mg/dL (ref 8.4–10.4)
CHLORIDE: 109 meq/L (ref 98–109)
CO2: 22 mEq/L (ref 22–29)
Creatinine: 0.6 mg/dL (ref 0.6–1.1)
Glucose: 134 mg/dl (ref 70–140)
POTASSIUM: 4.2 meq/L (ref 3.5–5.1)
Sodium: 141 mEq/L (ref 136–145)
Total Bilirubin: 0.31 mg/dL (ref 0.20–1.20)
Total Protein: 6.5 g/dL (ref 6.4–8.3)

## 2016-04-24 LAB — CBC WITH DIFFERENTIAL/PLATELET
BASO%: 0.2 % (ref 0.0–2.0)
BASOS ABS: 0 10*3/uL (ref 0.0–0.1)
EOS ABS: 0.2 10*3/uL (ref 0.0–0.5)
EOS%: 3.7 % (ref 0.0–7.0)
HEMATOCRIT: 29.8 % — AB (ref 34.8–46.6)
HEMOGLOBIN: 10 g/dL — AB (ref 11.6–15.9)
LYMPH#: 0.6 10*3/uL — AB (ref 0.9–3.3)
LYMPH%: 13.7 % — ABNORMAL LOW (ref 14.0–49.7)
MCH: 26.7 pg (ref 25.1–34.0)
MCHC: 33.6 g/dL (ref 31.5–36.0)
MCV: 79.5 fL (ref 79.5–101.0)
MONO#: 0.5 10*3/uL (ref 0.1–0.9)
MONO%: 11.2 % (ref 0.0–14.0)
NEUT#: 3.1 10*3/uL (ref 1.5–6.5)
NEUT%: 71.2 % (ref 38.4–76.8)
Platelets: 288 10*3/uL (ref 145–400)
RBC: 3.75 10*6/uL (ref 3.70–5.45)
RDW: 13.1 % (ref 11.2–14.5)
WBC: 4.3 10*3/uL (ref 3.9–10.3)

## 2016-04-24 MED ORDER — ONDANSETRON HCL 8 MG PO TABS: 8.0000 mg | ORAL_TABLET | Freq: Three times a day (TID) | ORAL | 0 refills | Status: DC | PRN

## 2016-04-24 MED ORDER — OXYCODONE HCL 5 MG PO TABS: 5.0000 mg | ORAL_TABLET | Freq: Two times a day (BID) | ORAL | 0 refills | Status: DC | PRN

## 2016-04-24 NOTE — Telephone Encounter (Signed)
Appointments scheduled, per 04/24/16 los. A copy of the AVS report and appointment schedule was given to the patient, per 04/24/16 los.  °

## 2016-04-24 NOTE — Telephone Encounter (Signed)
sw pt to confirm 12/4 appt date/timeper LT LOS

## 2016-04-24 NOTE — Progress Notes (Signed)
We will [place] OFFICE PROGRESS NOTE   Diagnosis:  Rectal cancer  INTERVAL HISTORY:   Destiny [name] returns as scheduled. She began cycle 1 adjuvant Xeloda 04/14/2016. She has intermittent nausea/vomiting. She takes Zofran as needed with good relief. No mouth sores. No significant diarrhea. She continues Imodium. No hand or foot pain or redness. She has intermittent pain which typically occurs when the ostomy bag is changed. She takes oxycodone as needed, typically once daily or every other day.  Objective:  Vital signs in last 24 hours:  Blood pressure (!) 117/40, pulse (!) 103, temperature 97.8 F (36.6 C), temperature source Oral, resp. rate 18, height _0  (1.575 m), weight 185 lb 8 oz (84.1 kg), SpO2 100 %.    HEENT: No thrush or ulcers. Resp: Lungs clear bilaterally. Cardio: Regular rate and rhythm. GI: Abdomen is soft. Mild tenderness surrounding the ostomy. No hepatomegaly. Thick stool present in the ostomy collection bag. Vascular: No leg edema. Skin: Palms without erythema.    Lab Results:  Lab Results  Component Value Date   WBC 4.3 04/24/2016   HGB 10.0 (L) 04/24/2016   HCT 29.8 (L) 04/24/2016   MCV 79.5 04/24/2016   PLT 288 04/24/2016   NEUTROABS 3.1 04/24/2016    Imaging:  No results found.  Medications: I have reviewed the patient's current medications.  Assessment/Plan: 1. Rectal cancer-clinical stage III (T3 N1)  No loss of mismatch repair protein expression  T3 N1 tumor beginning at 2 cm from the anal verge on an MRI of the pelvis 11/08/2015  CTs of the chest, abdomen, and pelvis 11/01/2015 with no evidence of distant metastatic disease  Initiation of neoadjuvant radiation/Xeloda 11/25/2015; completion of radiation/Xeloda 01/02/2016  Low anterior resection with a coloanal anastomosis 02/26/2016,ypT2,ypN0. Grade 3 adenocarcinoma, negative resection margins09/27/2017  Cycle 1 adjuvant Xeloda 04/14/2016  2. History of  hypertension  3. Left ureteral injury at the low anterior resection-status post a left ureteral neocystostomy and left double-J stent placement 02/26/2016   Disposition: [name] appears stable. She is completing cycle 1 adjuvant Xeloda. She will begin cycle 2 on 05/05/2016, lab on 05/04/2016. We will see her in follow-up in 3 weeks. She will contact the office in the interim with any problems.  A new prescription was sent to her pharmacy for Zofran. She was provided with a new prescription for oxycodone 5 mg one tablet every 12 hours as needed for pain.  Plan reviewed with Dr. Benay Spice.  Ned Card ANP/GNP-BC   04/24/2016  12:08 PM

## 2016-04-24 NOTE — Progress Notes (Signed)
Counselor met briefly with patient in [place] waiting area as follow-up to phone conversation on 04/15/2016. Patient stated that she still struggles with adjusting to the ileostomy bag, that she had a couple of accidents during the holiday that embarrassed her and dampened her mood. She continued to express frustration with not being able to work and be as active as she was before due to fear of the bag leaking or breaking. She agreed with counselor's statement that she feels unlike her usual self. Patient is hopeful that she is healing sufficiently to be able to have the procedure reversed in a month or so. Counselor offered to talk further with patient in a more private setting after her appointment was finished, but patient declined due to having other activities. Patient was agreeable to counselor calling at a later date to follow up.  Lamount Cohen, Counseling Intern Department for Spiritual Care and [facility] Supervisor - Scientist, research (physical sciences)

## 2016-04-30 ENCOUNTER — Other Ambulatory Visit: Payer: Self-pay | Admitting: *Deleted

## 2016-04-30 ENCOUNTER — Telehealth: Payer: Self-pay | Admitting: *Deleted

## 2016-04-30 MED ORDER — CAPECITABINE 500 MG PO TABS: ORAL_TABLET | ORAL | 0 refills | Status: DC

## 2016-04-30 NOTE — Telephone Encounter (Signed)
Message from [facility] requesting refill for [facility]. Rx sent electronically.

## 2016-04-30 NOTE — Telephone Encounter (Signed)
Left VM at home # to inquire on her current status w/chemo and her ostomy. Also asked if the [facility] ostomy nurse ever contacted her? Left direct # for her to call navigator back.

## 2016-05-04 ENCOUNTER — Other Ambulatory Visit (HOSPITAL_BASED_OUTPATIENT_CLINIC_OR_DEPARTMENT_OTHER): Payer: BLUE CROSS/BLUE SHIELD

## 2016-05-04 DIAGNOSIS — C20 Malignant neoplasm of rectum: Secondary | ICD-10-CM

## 2016-05-04 LAB — CBC WITH DIFFERENTIAL/PLATELET
BASO%: 0.2 % (ref 0.0–2.0)
Basophils Absolute: 0 10*3/uL (ref 0.0–0.1)
EOS ABS: 0.2 10*3/uL (ref 0.0–0.5)
EOS%: 2.5 % (ref 0.0–7.0)
HEMATOCRIT: 31.2 % — AB (ref 34.8–46.6)
HEMOGLOBIN: 10.3 g/dL — AB (ref 11.6–15.9)
LYMPH#: 0.7 10*3/uL — AB (ref 0.9–3.3)
LYMPH%: 12 % — AB (ref 14.0–49.7)
MCH: 26.6 pg (ref 25.1–34.0)
MCHC: 33.1 g/dL (ref 31.5–36.0)
MCV: 80.2 fL (ref 79.5–101.0)
MONO#: 0.6 10*3/uL (ref 0.1–0.9)
MONO%: 10.4 % (ref 0.0–14.0)
NEUT%: 74.9 % (ref 38.4–76.8)
NEUTROS ABS: 4.5 10*3/uL (ref 1.5–6.5)
PLATELETS: 345 10*3/uL (ref 145–400)
RBC: 3.88 10*6/uL (ref 3.70–5.45)
RDW: 15.1 % — ABNORMAL HIGH (ref 11.2–14.5)
WBC: 6 10*3/uL (ref 3.9–10.3)

## 2016-05-05 ENCOUNTER — Telehealth: Payer: Self-pay

## 2016-05-05 ENCOUNTER — Telehealth: Payer: Self-pay | Admitting: *Deleted

## 2016-05-05 NOTE — Telephone Encounter (Signed)
Patient left Vm that she is still having issues with her ileostomy and bags leaking. Has not heard anything from the [facility] nurse yet. Navigator called and left VM requesting return call with update on the potential for patient to be seen there.

## 2016-05-05 NOTE — Telephone Encounter (Signed)
-----   Message from Owens Shark, NP sent at 05/04/2016  1:42 PM EST ----- Please let her know ok to proceed with cycle 2 Xeloda beginning 05/05/2016

## 2016-05-05 NOTE — Telephone Encounter (Signed)
Called and informed pt okay to proceed with Xeloda today. Pt states she has not received the medication yet and will call the pharmacy. Informed pt if she doesn't receive it by this afternoon to call our office.

## 2016-05-06 ENCOUNTER — Other Ambulatory Visit: Payer: Self-pay | Admitting: *Deleted

## 2016-05-06 ENCOUNTER — Telehealth: Payer: Self-pay | Admitting: *Deleted

## 2016-05-06 DIAGNOSIS — C20 Malignant neoplasm of rectum: Secondary | ICD-10-CM

## 2016-05-06 MED ORDER — FLUCONAZOLE 100 MG PO TABS: 100.0000 mg | ORAL_TABLET | Freq: Every day | ORAL | 0 refills | Status: DC

## 2016-05-06 NOTE — Telephone Encounter (Signed)
Oncology Nurse Navigator Documentation  Oncology Nurse Navigator Flowsheets 05/06/2016  Navigator Location [facility]  Referral date to RadOnc/MedOnc -  Navigator Encounter Type Telephone  Telephone Outgoing Call;Medication Assistance;Symptom Mgt--Has not received her chemo pills yet and has developed yeast outbreak around her stoma again.  Abnormal Finding Date -  Confirmed Diagnosis Date -  Surgery Date -  Multidisiplinary Clinic Date -  Treatment Initiated Date -  Patient Visit Type -  Treatment Phase Active Tx--Xeloda  Barriers/Navigation Needs Coordination of Care  Education -  Interventions Medication Assistance;Coordination of Care  Referrals -  Coordination of Care Appts--Has appointment with ostomy nurse at [facility] on 12/14 at 11:30. Obtained permission to escribe Diflucan to her [facility] from Dr. Benay Spice Confirmed w/pharmacy they are waiting for patient to call them to arrange delivery-gave Stanton Kidney the phone # for the pharmacy to call to arrange her delivery of Xeloda  Education Method -  Support Groups/Services -  Specialty Items/DME -  Acuity Level 2  Time Spent with Patient 30

## 2016-05-08 ENCOUNTER — Encounter: Payer: Self-pay | Admitting: Family Medicine

## 2016-05-08 ENCOUNTER — Ambulatory Visit (INDEPENDENT_AMBULATORY_CARE_PROVIDER_SITE_OTHER): Payer: BLUE CROSS/BLUE SHIELD | Admitting: Family Medicine

## 2016-05-08 VITALS — BP 135/72 | HR 99 | Temp 97.6°F | Ht 62.0 in | Wt 182.2 lb

## 2016-05-08 DIAGNOSIS — G4700 Insomnia, unspecified: Secondary | ICD-10-CM

## 2016-05-08 DIAGNOSIS — I10 Essential (primary) hypertension: Secondary | ICD-10-CM | POA: Diagnosis not present

## 2016-05-08 MED ORDER — TRAZODONE HCL 50 MG PO TABS: 25.0000 mg | ORAL_TABLET | Freq: Every evening | ORAL | 3 refills | Status: DC | PRN

## 2016-05-08 NOTE — Progress Notes (Signed)
BP 135/72   Pulse 99   Temp 97.6 F (36.4 C) (Oral)   Ht 5\' 2"  (1.575 m)   Wt 182 lb 4 oz (82.7 kg)   LMP  (LMP Unknown) Comment: perimenopausal  BMI 33.33 kg/m    Subjective:    Patient ID: Destiny [name], female    DOB: 1965-12-31, 50 y.o.   MRN: [id number]  HPI: Destiny [name] is a 50 y.o. female presenting on 05/08/2016 for Hypertension (3 month followup) and Insomnia (patient reports she does not sleep well at night, has a hard time falling asleep and only sleeps 3-4 hours when she does)   HPI Hypertension recheck Patient is coming in today for hypertension recheck. She is currently off all of her blood pressure medications because she became more hypotensive while she is going through chemotherapy and radiation. Her blood pressure today is 135/72. He has been running there quite consistently since she's been going through all of this.  Insomnia Patient has been having difficulty sleeping that's been more of an issue over the past few months as she is going through some chemoradiation therapies. She says she has both troubles falling asleep and staying asleep. She does admit that she has a television in her room and watches until late hours at night. She also admits that she is an electronic device at nighttime. She does say that she has a bedtime habit and tries to go to sleep at 9:30 every night. She does have some anxiety that keeps her awake but mostly it's just difficulty falling asleep.  Relevant past medical, surgical, family and social history reviewed and updated as indicated. Interim medical history since our last visit reviewed. Allergies and medications reviewed and updated.  Review of Systems  Constitutional: Positive for fatigue (Fatigue with chemotherapy treatments and radiation). Negative for chills and fever.  Respiratory: Negative for chest tightness and shortness of breath.   Cardiovascular: Negative for chest pain and leg swelling.  Genitourinary: Negative for  difficulty urinating and dysuria.  Musculoskeletal: Negative for back pain and gait problem.  Skin: Negative for rash.  Neurological: Negative for dizziness, weakness, light-headedness, numbness and headaches.  Psychiatric/Behavioral: Positive for sleep disturbance. Negative for agitation and behavioral problems. The patient is nervous/anxious.   All other systems reviewed and are negative.   Per HPI unless specifically indicated above      Objective:    BP 135/72   Pulse 99   Temp 97.6 F (36.4 C) (Oral)   Ht 5\' 2"  (1.575 m)   Wt 182 lb 4 oz (82.7 kg)   LMP  (LMP Unknown) Comment: perimenopausal  BMI 33.33 kg/m   Wt Readings from Last 3 Encounters:  05/08/16 182 lb 4 oz (82.7 kg)  04/24/16 185 lb 8 oz (84.1 kg)  04/02/16 185 lb 11.2 oz (84.2 kg)    Physical Exam  Constitutional: She is oriented to person, place, and time. She appears well-developed and well-nourished. No distress.  Eyes: Conjunctivae are normal.  Neck: Neck supple. No thyromegaly present.  Cardiovascular: Normal rate, regular rhythm, normal heart sounds and intact distal pulses.   No murmur heard. Pulmonary/Chest: Effort normal and breath sounds normal. No respiratory distress. She has no wheezes. She has no rales.  Musculoskeletal: Normal range of motion. She exhibits no edema or tenderness.  Lymphadenopathy:    She has no cervical adenopathy.  Neurological: She is alert and oriented to person, place, and time. Coordination normal.  Skin: Skin is warm and dry. No  rash noted. She is not diaphoretic.  Psychiatric: She has a normal mood and affect. Her behavior is normal.  Nursing note and vitals reviewed.     Assessment & Plan:   Problem List Items Addressed This Visit      Cardiovascular and Mediastinum   Essential hypertension, benign - Primary    Other Visit Diagnoses    Insomnia, unspecified type       Also discussed changing bedtime routines and habits and sleep hygiene along with trazodone     Relevant Medications   traZODone (DESYREL) 50 MG tablet       Follow up plan: Return in about 6 months (around 11/06/2016), or if symptoms worsen or fail to improve, for Hypertension recheck.  Counseling provided for all of the vaccine components No orders of the defined types were placed in this encounter.   Caryl Pina, MD [facility] 05/08/2016, 1:33 PM

## 2016-05-08 NOTE — Progress Notes (Signed)
05/08/2016 counseling intern telephoned patient as follow-up to conversation on 04/24/2016. Patient sounded alert and oriented to time and place and engaged well with counselor. Patient stated she was filling ill due to having recently begun a new chemotherapy medication. She also stated that she continues to be frustrated with the ileostomy bag and reported that she had had to change it every day this week. Conversation was cut short, and counselor will check in with patient again in one week to offer continued emotional support and remind patient that counseling services are available.  Lamount Cohen, Counseling Intern Department for Spiritual Care and [facility] Supervisor - Scientist, research (physical sciences)

## 2016-05-11 ENCOUNTER — Encounter (HOSPITAL_COMMUNITY): Admission: RE | Admit: 2016-05-11 | Payer: BLUE CROSS/BLUE SHIELD | Source: Ambulatory Visit

## 2016-05-11 ENCOUNTER — Telehealth: Payer: Self-pay

## 2016-05-11 NOTE — Telephone Encounter (Signed)
Nuclear Medicine called regarding the RAI treatment scheduled for today. Patient did not show up for her scheduled appointment because she stated no one had contacted her. I advised the scheduler our [facility] had contacted the patient on 05/01/2016 to advise of this patient for her appointment. Scheduler voiced understanding on this and wanted to confirm Dr. Loanne Drilling still needed the RAI to be completed. I advise we do need the RAI she voiced understanding and patient has been rescheduled for 05/12/2016.

## 2016-05-12 ENCOUNTER — Encounter (HOSPITAL_COMMUNITY)
Admission: RE | Admit: 2016-05-12 | Discharge: 2016-05-12 | Disposition: A | Discharge status: Home / Self Care | Payer: BLUE CROSS/BLUE SHIELD | Source: Ambulatory Visit | Attending: Endocrinology | Admitting: Endocrinology

## 2016-05-12 ENCOUNTER — Other Ambulatory Visit: Payer: Self-pay | Admitting: Family Medicine

## 2016-05-12 DIAGNOSIS — E0590 Thyrotoxicosis, unspecified without thyrotoxic crisis or storm: Secondary | ICD-10-CM | POA: Diagnosis present

## 2016-05-12 LAB — HCG, SERUM, QUALITATIVE: Preg, Serum: NEGATIVE

## 2016-05-12 MED ORDER — SODIUM IODIDE I 131 CAPSULE
16.0000 | Freq: Once | INTRAVENOUS | Status: AC | PRN
  Administered 2016-05-12: 16 via ORAL

## 2016-05-15 ENCOUNTER — Other Ambulatory Visit (HOSPITAL_BASED_OUTPATIENT_CLINIC_OR_DEPARTMENT_OTHER): Payer: BLUE CROSS/BLUE SHIELD

## 2016-05-15 ENCOUNTER — Telehealth: Payer: Self-pay | Admitting: Oncology

## 2016-05-15 ENCOUNTER — Ambulatory Visit (HOSPITAL_BASED_OUTPATIENT_CLINIC_OR_DEPARTMENT_OTHER): Payer: BLUE CROSS/BLUE SHIELD | Admitting: Nurse Practitioner

## 2016-05-15 VITALS — BP 123/54 | HR 89 | Temp 98.2°F | Resp 18 | Ht 62.0 in | Wt 183.2 lb

## 2016-05-15 DIAGNOSIS — C20 Malignant neoplasm of rectum: Secondary | ICD-10-CM | POA: Diagnosis not present

## 2016-05-15 DIAGNOSIS — I10 Essential (primary) hypertension: Secondary | ICD-10-CM

## 2016-05-15 DIAGNOSIS — R112 Nausea with vomiting, unspecified: Secondary | ICD-10-CM | POA: Diagnosis not present

## 2016-05-15 LAB — CBC WITH DIFFERENTIAL/PLATELET
BASO%: 0.2 % (ref 0.0–2.0)
BASOS ABS: 0 10*3/uL (ref 0.0–0.1)
EOS%: 3.2 % (ref 0.0–7.0)
Eosinophils Absolute: 0.2 10*3/uL (ref 0.0–0.5)
HCT: 32.7 % — ABNORMAL LOW (ref 34.8–46.6)
HEMOGLOBIN: 10.8 g/dL — AB (ref 11.6–15.9)
LYMPH%: 8.8 % — AB (ref 14.0–49.7)
MCH: 26.4 pg (ref 25.1–34.0)
MCHC: 33.1 g/dL (ref 31.5–36.0)
MCV: 79.9 fL (ref 79.5–101.0)
MONO#: 0.5 10*3/uL (ref 0.1–0.9)
MONO%: 10.1 % (ref 0.0–14.0)
NEUT#: 3.9 10*3/uL (ref 1.5–6.5)
NEUT%: 77.7 % — ABNORMAL HIGH (ref 38.4–76.8)
Platelets: 302 10*3/uL (ref 145–400)
RBC: 4.1 10*6/uL (ref 3.70–5.45)
RDW: 15.5 % — AB (ref 11.2–14.5)
WBC: 5 10*3/uL (ref 3.9–10.3)
lymph#: 0.4 10*3/uL — ABNORMAL LOW (ref 0.9–3.3)

## 2016-05-15 LAB — COMPREHENSIVE METABOLIC PANEL
ALT: 9 U/L (ref 0–55)
AST: 14 U/L (ref 5–34)
Albumin: 3.1 g/dL — ABNORMAL LOW (ref 3.5–5.0)
Alkaline Phosphatase: 81 U/L (ref 40–150)
Anion Gap: 9 mEq/L (ref 3–11)
BUN: 13.1 mg/dL (ref 7.0–26.0)
CHLORIDE: 105 meq/L (ref 98–109)
CO2: 23 meq/L (ref 22–29)
Calcium: 10 mg/dL (ref 8.4–10.4)
Creatinine: 0.7 mg/dL (ref 0.6–1.1)
GLUCOSE: 154 mg/dL — AB (ref 70–140)
POTASSIUM: 3.8 meq/L (ref 3.5–5.1)
SODIUM: 138 meq/L (ref 136–145)
Total Bilirubin: 0.34 mg/dL (ref 0.20–1.20)
Total Protein: 7.1 g/dL (ref 6.4–8.3)

## 2016-05-15 MED ORDER — CAPECITABINE 500 MG PO TABS: ORAL_TABLET | ORAL | 0 refills | Status: DC

## 2016-05-15 NOTE — Progress Notes (Addendum)
  [place] OFFICE PROGRESS NOTE   Diagnosis:  Rectal cancer  INTERVAL HISTORY:   Destiny [name] returns as scheduled. She began cycle 2 adjuvant Xeloda 05/08/2016. She has intermittent nausea/vomiting that is not clearly related to Xeloda. She did not take Xeloda yesterday and had 3 episodes of nausea/vomiting. No mouth sores. Ostomy output ranges from watery to soft. No hand or foot pain or redness.  Objective:  Vital signs in last 24 hours:  Blood pressure (!) 123/54, pulse 89, temperature 98.2 F (36.8 C), temperature source Oral, resp. rate 18, height '5\' 2"'$  (1.575 m), weight 183 lb 3.2 oz (83.1 kg), SpO2 100 %.    HEENT: No thrush or ulcers. Resp: Lungs clear bilaterally. Cardio: Regular rate and rhythm. GI: Abdomen is soft. Mild generalized tenderness. Right abdomen ostomy. Watery output in the collection bag. Thick stool at the ostomy site. Vascular: No leg edema. Skin: Palms without erythema.    Lab Results:  Lab Results  Component Value Date   WBC 5.0 05/15/2016   HGB 10.8 (L) 05/15/2016   HCT 32.7 (L) 05/15/2016   MCV 79.9 05/15/2016   PLT 302 05/15/2016   NEUTROABS 3.9 05/15/2016    Imaging:  No results found.  Medications: I have reviewed the patient's current medications.  Assessment/Plan: 1. Rectal cancer-clinical stage III (T3 N1)  No loss of mismatch repair protein expression  T3 N1 tumor beginning at 2 cm from the anal verge on an MRI of the pelvis 11/08/2015  CTs of the chest, abdomen, and pelvis 11/01/2015 with no evidence of distant metastatic disease  Initiation of neoadjuvant radiation/Xeloda 11/25/2015; completion of radiation/Xeloda 01/02/2016  Low anterior resection with a coloanal anastomosis 02/26/2016,ypT2,ypN0. Grade 3 adenocarcinoma, negative resection margins09/27/2017  No BRAF, KRAS, NRAS mutation; Microsatellite status could not be determined  Cycle 1 adjuvant Xeloda 04/14/2016  Cycle 2 adjuvant Xeloda  05/08/2016  2. History of hypertension  3. Left ureteral injury at the low anterior resection-status post a left ureteral neocystostomy and left double-J stent placement 02/26/2016  4. Hyperthyroidism status post radioactive iodine 05/12/2016   Disposition: Destiny [name] appears stable. She is completing cycle 2 adjuvant Xeloda. Overall she seems to be tolerating the Xeloda well. Not clear that the nausea/vomiting is related to Xeloda.  She will return for a follow-up visit in 2 weeks with tentative plans to begin cycle 3 Xeloda that day. She will contact the office in the interim with any problems.  Patient seen with Dr. Benay Spice.    Ned Card ANP/GNP-BC   05/15/2016  10:15 AM This was a shared visit with Ned Card. Destiny [name] will continue adjuvant Xeloda.  Julieanne Manson, M.D.

## 2016-05-15 NOTE — Progress Notes (Signed)
Counseling intern spoke with patient by telephone to inform that counselor will be out of hospital until January 2. Encouraged patient to call [facility] staff if she feels anxious during the interim. Counselor will follow up with patient the first week of January.  Lamount Cohen, Counseling Intern Department for Spiritual Care and [facility] Supervisor - Scientist, research (physical sciences)

## 2016-05-15 NOTE — Telephone Encounter (Signed)
Pt confirmed appt and received avs °

## 2016-05-19 ENCOUNTER — Encounter (HOSPITAL_COMMUNITY): Payer: Self-pay

## 2016-05-22 ENCOUNTER — Other Ambulatory Visit: Payer: Self-pay | Admitting: *Deleted

## 2016-05-22 MED ORDER — CAPECITABINE 500 MG PO TABS: ORAL_TABLET | ORAL | 0 refills | Status: DC

## 2016-05-29 ENCOUNTER — Ambulatory Visit (HOSPITAL_BASED_OUTPATIENT_CLINIC_OR_DEPARTMENT_OTHER): Payer: BLUE CROSS/BLUE SHIELD | Admitting: Oncology

## 2016-05-29 ENCOUNTER — Encounter: Payer: Self-pay | Admitting: Oncology

## 2016-05-29 ENCOUNTER — Telehealth: Payer: Self-pay | Admitting: Oncology

## 2016-05-29 ENCOUNTER — Other Ambulatory Visit (HOSPITAL_BASED_OUTPATIENT_CLINIC_OR_DEPARTMENT_OTHER): Payer: BLUE CROSS/BLUE SHIELD

## 2016-05-29 ENCOUNTER — Encounter: Payer: Self-pay | Admitting: *Deleted

## 2016-05-29 VITALS — BP 124/56 | HR 91 | Temp 98.6°F | Resp 16 | Ht 62.0 in | Wt 186.9 lb

## 2016-05-29 DIAGNOSIS — C20 Malignant neoplasm of rectum: Secondary | ICD-10-CM | POA: Diagnosis not present

## 2016-05-29 DIAGNOSIS — I10 Essential (primary) hypertension: Secondary | ICD-10-CM | POA: Diagnosis not present

## 2016-05-29 LAB — COMPREHENSIVE METABOLIC PANEL
ALK PHOS: 96 U/L (ref 40–150)
ALT: 12 U/L (ref 0–55)
ANION GAP: 9 meq/L (ref 3–11)
AST: 17 U/L (ref 5–34)
Albumin: 3.6 g/dL (ref 3.5–5.0)
BILIRUBIN TOTAL: 0.51 mg/dL (ref 0.20–1.20)
BUN: 8.7 mg/dL (ref 7.0–26.0)
CALCIUM: 10 mg/dL (ref 8.4–10.4)
CO2: 25 meq/L (ref 22–29)
Chloride: 105 mEq/L (ref 98–109)
Creatinine: 0.6 mg/dL (ref 0.6–1.1)
Glucose: 131 mg/dl (ref 70–140)
Potassium: 4.6 mEq/L (ref 3.5–5.1)
Sodium: 140 mEq/L (ref 136–145)
TOTAL PROTEIN: 7.4 g/dL (ref 6.4–8.3)

## 2016-05-29 LAB — CBC WITH DIFFERENTIAL/PLATELET
BASO%: 0.2 % (ref 0.0–2.0)
Basophils Absolute: 0 10*3/uL (ref 0.0–0.1)
EOS ABS: 0.2 10*3/uL (ref 0.0–0.5)
EOS%: 3.5 % (ref 0.0–7.0)
HEMATOCRIT: 33.3 % — AB (ref 34.8–46.6)
HEMOGLOBIN: 11.3 g/dL — AB (ref 11.6–15.9)
LYMPH#: 0.3 10*3/uL — AB (ref 0.9–3.3)
LYMPH%: 7.6 % — ABNORMAL LOW (ref 14.0–49.7)
MCH: 27.1 pg (ref 25.1–34.0)
MCHC: 33.8 g/dL (ref 31.5–36.0)
MCV: 80 fL (ref 79.5–101.0)
MONO#: 0.4 10*3/uL (ref 0.1–0.9)
MONO%: 9.6 % (ref 0.0–14.0)
NEUT%: 79.1 % — ABNORMAL HIGH (ref 38.4–76.8)
NEUTROS ABS: 3.7 10*3/uL (ref 1.5–6.5)
PLATELETS: 362 10*3/uL (ref 145–400)
RBC: 4.16 10*6/uL (ref 3.70–5.45)
RDW: 16.9 % — ABNORMAL HIGH (ref 11.2–14.5)
WBC: 4.6 10*3/uL (ref 3.9–10.3)

## 2016-05-29 MED ORDER — NYSTATIN 100000 UNIT/GM EX CREA: 1.0000 "application " | TOPICAL_CREAM | Freq: Two times a day (BID) | CUTANEOUS | 0 refills | Status: DC

## 2016-05-29 MED ORDER — OXYCODONE HCL 5 MG PO TABS: 5.0000 mg | ORAL_TABLET | Freq: Two times a day (BID) | ORAL | 0 refills | Status: DC | PRN

## 2016-05-29 NOTE — Progress Notes (Signed)
Oncology Nurse Navigator Documentation  Oncology Nurse Navigator Flowsheets 05/29/2016  Navigator Location [facility]  Referral date to RadOnc/MedOnc -  Navigator Encounter Type Follow-up Appt  Telephone -  Abnormal Finding Date -  Confirmed Diagnosis Date -  Surgery Date -  Multidisiplinary Clinic Date -  Treatment Initiated Date -  Patient Visit Type MedOnc  Treatment Phase Active Tx--adjuvant Xeloda  Barriers/Navigation Needs No barriers at this time;Education  Education   Interventions Other-Sexual intimacy discussion. Not sexually active due to not feeling attractive and ostomy leaks at time. Says her boyfriend is OK with this as well Other--will have her f/u with radiation oncology and obtain dilator after ostomy is reversed and then work with physical therapy    Referrals -  Coordination of Care -She sees Dr. Marcello Moores on 06/08/16 and hopes surgery can be planned soon.   Education Method -  Support Groups/Services -Tells RN her boyfriend of 8 years does the best job with her ostomy care. Expresses how much she loves him and helps her physically and emotionally.  Specialty Items/DME -  Acuity Level 1  Time Spent with Patient 15  Provided her a copy of her labs and informed her they look good and her Hgb is better. She has also gained some weight since last visit, which is a good sign.

## 2016-05-29 NOTE — Telephone Encounter (Signed)
Appointments scheduled per 12/29 LOS. Patient given AVS report and calendars with future scheduled appointments. °

## 2016-05-29 NOTE — Progress Notes (Signed)
  [facility] OFFICE PROGRESS NOTE   Diagnosis:  Rectal cancer  INTERVAL HISTORY:   Ms. Martorano returns as scheduled. She she is due to begin cycle 3 of adjuvant Xeloda today. She has intermittent nausea/vomiting that is not clearly related to Xeloda.  output ranges from watery to soft. Has been seen by an ostomy nurse for assistance with better placement of the ostomy. Reports a small rash and itching just under the colostomy. No hand or foot pain or redness. Continues to have pain and uses oxycodone about once a day. Requests a refill on this today.  Objective:  Vital signs in last 24 hours:  Blood pressure (!) 124/56, pulse 91, temperature 98.6 F (37 C), temperature source Oral, resp. rate 16, height _0  (1.575 m), weight 186 lb 14.4 oz (84.8 kg), SpO2 100 %.    HEENT: No thrush or ulcers. Resp: Lungs clear bilaterally. Cardio: Regular rate and rhythm. GI: Abdomen is soft. Mild generalized tenderness. Right abdomen ostomy. Watery output in the collection bag. Thick stool at the ostomy site. Small reddened area approximately 1/2 cm just below the ostomy dressing. No drainage noted. Vascular: No leg edema. Skin: Palms without erythema.    Lab Results:  Lab Results  Component Value Date   WBC 4.6 05/29/2016   HGB 11.3 (L) 05/29/2016   HCT 33.3 (L) 05/29/2016   MCV 80.0 05/29/2016   PLT 362 05/29/2016   NEUTROABS 3.7 05/29/2016    Imaging:  No results found.  Medications: I have reviewed the patient's current medications.  Assessment/Plan: 1. Rectal cancer-clinical stage III (T3 N1)  No loss of mismatch repair protein expression  T3 N1 tumor beginning at 2 cm from the anal verge on an MRI of the pelvis 11/08/2015  CTs of the chest, abdomen, and pelvis 11/01/2015 with no evidence of distant metastatic disease  Initiation of neoadjuvant radiation/Xeloda 11/25/2015; completion of radiation/Xeloda 01/02/2016  Low anterior resection with a coloanal  anastomosis 02/26/2016,ypT2,ypN0. Grade 3 adenocarcinoma, negative resection margins09/27/2017  No BRAF, KRAS, NRAS mutation; Microsatellite status could not be determined  Cycle 1 adjuvant Xeloda 04/14/2016  Cycle 2 adjuvant Xeloda 05/08/2016  Cycle 3 adjuvant Xeloda 05/29/2016  2. History of hypertension  3. Left ureteral injury at the low anterior resection-status post a left ureteral neocystostomy and left double-J stent placement 02/26/2016  4. Hyperthyroidism status post radioactive iodine 05/12/2016   Disposition: Destiny [name] appears stable. He was instructed to begin cycle 3 of her adjuvant Xeloda today.  She reports that she is due to see her surgeon on 06/08/2016 and we have asked her to call us after that appointment to let us know the plan.  I have given her prescription for nystatin cream to be applied to the reddened area just below her ostomy. I have refilled her oxycodone today.  She will return for a follow-up visit in 3 weeks with tentative plans to begin cycle 4 Xeloda that day. She will contact the office in the interim with any problems.  Patient seen with Dr. Benay Spice.    Mikey Bussing, DNP, AGPCNP-BC, AOCNP   05/29/2016  12:58 PM

## 2016-06-02 ENCOUNTER — Telehealth: Payer: Self-pay | Admitting: *Deleted

## 2016-06-02 NOTE — Telephone Encounter (Signed)
Call from [place], Pharmacy Tech at [facility]  She asks if pt is due for refill on  Capecitabine.  Last filled on 12/21.  Reviewed chart and informed Varney Biles this Cycle started on 12/29 for 14 days on and one week off of 21 day cycle.   Next cycle would be due to start on 06/19/16.   It is a little too soon to fill again but please check again in about one week.

## 2016-06-11 ENCOUNTER — Other Ambulatory Visit: Payer: Self-pay | Admitting: *Deleted

## 2016-06-11 MED ORDER — CAPECITABINE 500 MG PO TABS: ORAL_TABLET | ORAL | 0 refills | Status: DC

## 2016-06-19 ENCOUNTER — Ambulatory Visit (HOSPITAL_BASED_OUTPATIENT_CLINIC_OR_DEPARTMENT_OTHER): Payer: BLUE CROSS/BLUE SHIELD | Admitting: Oncology

## 2016-06-19 ENCOUNTER — Telehealth: Payer: Self-pay | Admitting: Oncology

## 2016-06-19 ENCOUNTER — Other Ambulatory Visit (HOSPITAL_BASED_OUTPATIENT_CLINIC_OR_DEPARTMENT_OTHER): Payer: BLUE CROSS/BLUE SHIELD

## 2016-06-19 VITALS — BP 134/67 | HR 92 | Temp 97.7°F | Resp 21 | Ht 62.0 in | Wt 189.9 lb

## 2016-06-19 DIAGNOSIS — C20 Malignant neoplasm of rectum: Secondary | ICD-10-CM | POA: Diagnosis not present

## 2016-06-19 DIAGNOSIS — E0580 Other thyrotoxicosis without thyrotoxic crisis or storm: Secondary | ICD-10-CM

## 2016-06-19 DIAGNOSIS — I10 Essential (primary) hypertension: Secondary | ICD-10-CM

## 2016-06-19 LAB — COMPREHENSIVE METABOLIC PANEL
ALT: 11 U/L (ref 0–55)
AST: 14 U/L (ref 5–34)
Albumin: 3.6 g/dL (ref 3.5–5.0)
Alkaline Phosphatase: 108 U/L (ref 40–150)
Anion Gap: 9 mEq/L (ref 3–11)
BUN: 12.2 mg/dL (ref 7.0–26.0)
CHLORIDE: 104 meq/L (ref 98–109)
CO2: 24 meq/L (ref 22–29)
CREATININE: 0.8 mg/dL (ref 0.6–1.1)
Calcium: 9.9 mg/dL (ref 8.4–10.4)
EGFR: 90 mL/min/{1.73_m2} — ABNORMAL LOW (ref >90)
GLUCOSE: 185 mg/dL — AB (ref 70–140)
Potassium: 4.3 mEq/L (ref 3.5–5.1)
SODIUM: 137 meq/L (ref 136–145)
TOTAL PROTEIN: 7.4 g/dL (ref 6.4–8.3)
Total Bilirubin: 0.54 mg/dL (ref 0.20–1.20)

## 2016-06-19 LAB — CBC WITH DIFFERENTIAL/PLATELET
BASO%: 0.2 % (ref 0.0–2.0)
Basophils Absolute: 0 10*3/uL (ref 0.0–0.1)
EOS%: 2.3 % (ref 0.0–7.0)
Eosinophils Absolute: 0.1 10*3/uL (ref 0.0–0.5)
HCT: 32.8 % — ABNORMAL LOW (ref 34.8–46.6)
HGB: 11.3 g/dL — ABNORMAL LOW (ref 11.6–15.9)
LYMPH%: 6.8 % — ABNORMAL LOW (ref 14.0–49.7)
MCH: 28.4 pg (ref 25.1–34.0)
MCHC: 34.5 g/dL (ref 31.5–36.0)
MCV: 82.1 fL (ref 79.5–101.0)
MONO#: 0.5 10*3/uL (ref 0.1–0.9)
MONO%: 7.6 % (ref 0.0–14.0)
NEUT%: 83.1 % — ABNORMAL HIGH (ref 38.4–76.8)
NEUTROS ABS: 5.3 10*3/uL (ref 1.5–6.5)
Platelets: 292 10*3/uL (ref 145–400)
RBC: 3.99 10*6/uL (ref 3.70–5.45)
RDW: 19.3 % — ABNORMAL HIGH (ref 11.2–14.5)
WBC: 6.3 10*3/uL (ref 3.9–10.3)
lymph#: 0.4 10*3/uL — ABNORMAL LOW (ref 0.9–3.3)

## 2016-06-19 MED ORDER — ONDANSETRON HCL 8 MG PO TABS: 8.0000 mg | ORAL_TABLET | Freq: Three times a day (TID) | ORAL | 0 refills | Status: DC | PRN

## 2016-06-19 NOTE — Telephone Encounter (Signed)
Appointments scheduled per 11/9 LOS. Patient given AVS report and calendars with future scheduled appointments. °

## 2016-06-19 NOTE — Progress Notes (Signed)
  [facility] OFFICE PROGRESS NOTE   Diagnosis: Rectal cancer  INTERVAL HISTORY:   Ms. Labuda returns as scheduled. She began another cycle of Xeloda on 05/29/2016. She denies mouth sores, diarrhea, and hand/foot pain. Good appetite. She felt "dizzy "this morning. She empties the colostomy bag approximately 4-5 times per day.  Objective:  Vital signs in last 24 hours:  Blood pressure 134/67, pulse 92, temperature 97.7 F (36.5 C), temperature source Oral, resp. rate (!) 21, height '5\' 2"'$  (1.575 m), weight 189 lb 14.4 oz (86.1 kg), SpO2 98 %.    HEENT: No thrush or ulcers Resp: Lungs clear bilaterally Cardio: Regular rate and rhythm GI: No hepatomegaly, right abdomen ileostomy was semi-formed stool Vascular: No leg edema  Skin: soles with mild hyperpigmentation, no skin breakdown     Lab Results:  Lab Results  Component Value Date   WBC 6.3 06/19/2016   HGB 11.3 (L) 06/19/2016   HCT 32.8 (L) 06/19/2016   MCV 82.1 06/19/2016   PLT 292 06/19/2016   NEUTROABS 5.3 06/19/2016     Medications: I have reviewed the patient's current medications.  Assessment/Plan: 1. Rectal cancer-clinical stage III (T3 N1)  No loss of mismatch repair protein expression  T3 N1 tumor beginning at 2 cm from the anal verge on an MRI of the pelvis 11/08/2015  CTs of the chest, abdomen, and pelvis 11/01/2015 with no evidence of distant metastatic disease  Initiation of neoadjuvant radiation/Xeloda 11/25/2015; completion of radiation/Xeloda 01/02/2016  Low anterior resection with a coloanal anastomosis 02/26/2016,ypT2,ypN0. Grade 3 adenocarcinoma, negative resection margins09/27/2017  No BRAF, KRAS, NRAS mutation; Microsatellite status could not be determined  Cycle 1 adjuvant Xeloda 04/14/2016  Cycle 2 adjuvant Xeloda 05/08/2016  Cycle 3 adjuvant Xeloda 05/29/2016  2. History of hypertension  3. Left ureteral injury at the low anterior resection-status post a left  ureteral neocystostomy and left double-J stent placement 02/26/2016  4. Hyperthyroidism status post radioactive iodine 05/12/2016    Disposition:  Ms. Dingee appears stable. She is tolerating the Xeloda well. She is scheduled to begin the next cycle of adjuvant Xeloda today. She has not received the shipment from her pharmacy. We will check into this while she is here today. She will return for an office visit on 07/09/2016 with the plan to complete the final cycle of Xeloda after that visit.  She will see Dr. Marcello Moores to schedule reversal of the ileostomy after the completion of adjuvant chemotherapy.  15 minutes were spent with the patient today. The majority of the time was used for counseling and coordination of care.  Betsy Coder, MD  06/19/2016  11:28 AM

## 2016-06-19 NOTE — Addendum Note (Signed)
Addended by: Rosalio Macadamia C on: 06/19/2016 12:20 PM   Modules accepted: Orders

## 2016-06-20 NOTE — Progress Notes (Signed)
Subjective:    Patient ID: Destiny [name], female    DOB: Mar 12, 1966, 51 y.o.   MRN: [id number]  HPI Pt returns for f/u of hyperthyroidism (dx'ed 2016; probably due to [place] dz; she has never had dedicated thyroid imaging; tapazole was chosen as initial rx, but she eventually had RAI in Dec of 2017).  Since then, she feels no different, and well in general.    Past Medical History:  Diagnosis Date  . Cancer ([place]) 10/29/15   rectal invasice adenocarcinoma   . Chronic back pain   . Depression   . Hypertension   . Hyperthyroidism   . Rectal cancer ([place])    Adenocarcinoma dx 10/29/15; Stage III, T3, N1, MO    Past Surgical History:  Procedure Laterality Date  . ABDOMINAL HYSTERECTOMY     vaginal   . BLADDER SURGERY    . EYE SURGERY    . ILEOSTOMY    . URETERAL REIMPLANTION Left 02/26/2016   Procedure: left ureteral neocystotomy,double J stent placement, closure vaginal cuff;  Surgeon: Ardis Hughs, MD;  Location: [facility];  Service: Urology;  Laterality: Left;  Marland Kitchen VAGINAL HYSTERECTOMY    . XI ROBOTIC ASSISTED LOWER ANTERIOR RESECTION N/A 02/26/2016   Procedure: XI ROBOTIC ASSISTED LOWER ANTERIOR RESECTION, diverting LOOP ILEOSTOMY, mobilization splenic flexure, omentopexy;  Surgeon: Leighton Ruff, MD;  Location: [facility];  Service: General;  Laterality: N/A;    Social History   Social History  . Marital status: Divorced    Spouse name: N/A  . Number of children: 3  . Years of education: N/A   Occupational History  . Not on file.   Social History Main Topics  . Smoking status: Never Smoker  . Smokeless tobacco: Never Used  . Alcohol use No     Comment: occ  . Drug use: No  . Sexual activity: Yes    Birth control/ protection: Surgical   Other Topics Concern  . Not on file   Social History Narrative  . No narrative on file    Current Outpatient Prescriptions on File Prior to Visit  Medication Sig Dispense Refill  . acetaminophen (TYLENOL) 500 MG tablet Take 1,000  mg by mouth every 8 (eight) hours as needed for moderate pain.    . Ascorbic Acid (VITAMIN C PO) Take 1 tablet by mouth daily.    . capecitabine (XELODA) 500 MG tablet Take 4 tabs (2,000 mg) QAM 3 tabs (1,500 mg) QPM. Take after meals Days 1-14 of 21 day cycle 98 tablet 0  . feeding supplement, ENSURE ENLIVE, (ENSURE ENLIVE) LIQD Take 237 mLs by mouth 2 (two) times daily between meals. 237 mL 12  . ferrous sulfate 325 (65 FE) MG tablet Take 325 mg by mouth daily with breakfast.    . gabapentin (NEURONTIN) 300 MG capsule 1 CAPSULE TODAY, TWICE A DAY FRIDAY, THEN 3 TIMES A DAY FOR 30 DAYS 93 capsule 1  . ibuprofen (ADVIL,MOTRIN) 200 MG tablet Take 400 mg by mouth every 6 (six) hours as needed for fever.    . lidocaine-prilocaine (EMLA) cream Apply 1 application topically as needed. 30 g 2  . loperamide (IMODIUM) 2 MG capsule Take 1 capsule (2 mg total) by mouth 3 (three) times daily as needed for diarrhea or loose stools. 30 capsule 0  . LORazepam (ATIVAN) 0.5 MG tablet TAKE  (1)  TABLET TWICE A DAY. (Patient taking differently: Take 0.5mg s at bedtime) 60 tablet 1  . methocarbamol (ROBAXIN) 500 MG tablet TAKE (  2) TABLETS 4 TIMES A DAY AS NEEDED FOR MUSCLE RELAXATION. 60 tablet 0  . nystatin cream (MYCOSTATIN) Apply 1 application topically 2 (two) times daily. 30 g 0  . ondansetron (ZOFRAN) 8 MG tablet Take 1 tablet (8 mg total) by mouth every 8 (eight) hours as needed for nausea or vomiting. 30 tablet 0  . oxyCODONE (OXY IR/ROXICODONE) 5 MG immediate release tablet Take 1 tablet (5 mg total) by mouth every 12 (twelve) hours as needed for severe pain. 20 tablet 0  . sertraline (ZOLOFT) 100 MG tablet Take 1 tablet (100 mg total) by mouth daily. 30 tablet 5   No current facility-administered medications on file prior to visit.     Allergies  Allergen Reactions  . Latex Itching and Rash    Family History  Problem Relation Age of Onset  . Thyroid disease Mother   . Lung cancer Father     lung    . Cirrhosis Brother    BP 128/86   Pulse 81   Ht 5\' 2"  (1.575 m)   Wt 191 lb (86.6 kg)   LMP  (LMP Unknown) Comment: perimenopausal  SpO2 97%   BMI 34.93 kg/m   Review of Systems Denies edema    Objective:   Physical Exam VITAL SIGNS:  See vs page GENERAL: no distress head: no deformity  eyes: no periorbital swelling, no proptosis  NECK: thyroid is slightly and diffusely enlarged.  No palpable nodule.   Skin: not diaphoretic.  Neuro: no tremor.   Lab Results  Component Value Date   TSH 0.15 (L) 06/23/2016   T4TOTAL 32.3 (HH) 02/18/2015      Assessment & Plan:  Hyperthyroidism: not better yet.  Please come back for a follow-up appointment in 6 weeks.

## 2016-06-23 ENCOUNTER — Telehealth: Payer: Self-pay | Admitting: Family Medicine

## 2016-06-23 ENCOUNTER — Ambulatory Visit (INDEPENDENT_AMBULATORY_CARE_PROVIDER_SITE_OTHER): Payer: BLUE CROSS/BLUE SHIELD | Admitting: Endocrinology

## 2016-06-23 ENCOUNTER — Encounter: Payer: Self-pay | Admitting: Endocrinology

## 2016-06-23 VITALS — BP 128/86 | HR 81 | Ht 62.0 in | Wt 191.0 lb

## 2016-06-23 DIAGNOSIS — G4700 Insomnia, unspecified: Secondary | ICD-10-CM

## 2016-06-23 DIAGNOSIS — E0590 Thyrotoxicosis, unspecified without thyrotoxic crisis or storm: Secondary | ICD-10-CM | POA: Diagnosis not present

## 2016-06-23 LAB — TSH: TSH: 0.15 u[IU]/mL — AB (ref 0.35–4.50)

## 2016-06-23 LAB — T4, FREE: Free T4: 2.41 ng/dL — ABNORMAL HIGH (ref 0.60–1.60)

## 2016-06-23 MED ORDER — TRAZODONE HCL 50 MG PO TABS: 25.0000 mg | ORAL_TABLET | Freq: Every evening | ORAL | 3 refills | Status: DC | PRN

## 2016-06-23 NOTE — Telephone Encounter (Signed)
Patient aware that rx sent to [facility].

## 2016-06-23 NOTE — Patient Instructions (Addendum)
blood tests are requested for you today.  We'll let you know about the results.  Please come back for a follow-up appointment in 6 weeks.  

## 2016-06-24 ENCOUNTER — Telehealth: Payer: Self-pay | Admitting: Oncology

## 2016-06-24 ENCOUNTER — Telehealth: Payer: Self-pay | Admitting: *Deleted

## 2016-06-24 NOTE — Telephone Encounter (Signed)
FAXED RECORDS TO [facility] [id number]

## 2016-06-24 NOTE — Telephone Encounter (Signed)
Call from pt's ostomy supplier requesting order for supplies. Order signed by Dr. Benay Spice, faxed to [facility].

## 2016-07-09 ENCOUNTER — Ambulatory Visit (HOSPITAL_BASED_OUTPATIENT_CLINIC_OR_DEPARTMENT_OTHER): Payer: BLUE CROSS/BLUE SHIELD | Admitting: Nurse Practitioner

## 2016-07-09 ENCOUNTER — Other Ambulatory Visit (HOSPITAL_BASED_OUTPATIENT_CLINIC_OR_DEPARTMENT_OTHER): Payer: BLUE CROSS/BLUE SHIELD

## 2016-07-09 ENCOUNTER — Telehealth: Payer: Self-pay | Admitting: Oncology

## 2016-07-09 ENCOUNTER — Other Ambulatory Visit: Payer: Self-pay

## 2016-07-09 ENCOUNTER — Telehealth: Payer: Self-pay

## 2016-07-09 ENCOUNTER — Other Ambulatory Visit: Payer: Self-pay | Admitting: Nurse Practitioner

## 2016-07-09 VITALS — BP 126/51 | HR 91 | Temp 98.4°F | Resp 18 | Ht 62.0 in | Wt 197.1 lb

## 2016-07-09 DIAGNOSIS — C20 Malignant neoplasm of rectum: Secondary | ICD-10-CM

## 2016-07-09 DIAGNOSIS — E0580 Other thyrotoxicosis without thyrotoxic crisis or storm: Secondary | ICD-10-CM

## 2016-07-09 DIAGNOSIS — Z79899 Other long term (current) drug therapy: Secondary | ICD-10-CM

## 2016-07-09 DIAGNOSIS — I10 Essential (primary) hypertension: Secondary | ICD-10-CM | POA: Diagnosis not present

## 2016-07-09 LAB — CBC WITH DIFFERENTIAL/PLATELET
BASO%: 0.2 % (ref 0.0–2.0)
Basophils Absolute: 0 10*3/uL (ref 0.0–0.1)
EOS%: 3.5 % (ref 0.0–7.0)
Eosinophils Absolute: 0.2 10*3/uL (ref 0.0–0.5)
HCT: 33.8 % — ABNORMAL LOW (ref 34.8–46.6)
HGB: 11.7 g/dL (ref 11.6–15.9)
LYMPH#: 0.5 10*3/uL — AB (ref 0.9–3.3)
LYMPH%: 10.1 % — AB (ref 14.0–49.7)
MCH: 29.2 pg (ref 25.1–34.0)
MCHC: 34.6 g/dL (ref 31.5–36.0)
MCV: 84.5 fL (ref 79.5–101.0)
MONO#: 0.4 10*3/uL (ref 0.1–0.9)
MONO%: 8.8 % (ref 0.0–14.0)
NEUT%: 77.4 % — ABNORMAL HIGH (ref 38.4–76.8)
NEUTROS ABS: 3.8 10*3/uL (ref 1.5–6.5)
PLATELETS: 286 10*3/uL (ref 145–400)
RBC: 3.99 10*6/uL (ref 3.70–5.45)
RDW: 18.9 % — ABNORMAL HIGH (ref 11.2–14.5)
WBC: 5 10*3/uL (ref 3.9–10.3)

## 2016-07-09 LAB — URINALYSIS, MICROSCOPIC - CHCC
BILIRUBIN (URINE): NEGATIVE
Glucose: NEGATIVE mg/dL
KETONES: NEGATIVE mg/dL
Nitrite: NEGATIVE
Protein: NEGATIVE mg/dL
SPECIFIC GRAVITY, URINE: 1.005 (ref 1.003–1.035)
UROBILINOGEN UR: 0.2 mg/dL (ref 0.2–1)
pH: 6 (ref 4.6–8.0)

## 2016-07-09 LAB — COMPREHENSIVE METABOLIC PANEL
ALT: 13 U/L (ref 0–55)
ANION GAP: 9 meq/L (ref 3–11)
AST: 15 U/L (ref 5–34)
Albumin: 3.6 g/dL (ref 3.5–5.0)
Alkaline Phosphatase: 116 U/L (ref 40–150)
BUN: 14 mg/dL (ref 7.0–26.0)
CHLORIDE: 104 meq/L (ref 98–109)
CO2: 25 meq/L (ref 22–29)
CREATININE: 0.7 mg/dL (ref 0.6–1.1)
Calcium: 9.8 mg/dL (ref 8.4–10.4)
EGFR: >90 mL/min/{1.73_m2} (ref >90)
GLUCOSE: 185 mg/dL — AB (ref 70–140)
Potassium: 4.2 mEq/L (ref 3.5–5.1)
SODIUM: 137 meq/L (ref 136–145)
TOTAL PROTEIN: 7.2 g/dL (ref 6.4–8.3)
Total Bilirubin: 0.7 mg/dL (ref 0.20–1.20)

## 2016-07-09 MED ORDER — NITROFURANTOIN MONOHYD MACRO 100 MG PO CAPS: 100.0000 mg | ORAL_CAPSULE | Freq: Two times a day (BID) | ORAL | 0 refills | Status: DC

## 2016-07-09 MED ORDER — CAPECITABINE 500 MG PO TABS: ORAL_TABLET | ORAL | 0 refills | Status: DC

## 2016-07-09 NOTE — Telephone Encounter (Signed)
Appointments scheduled per 07/09/16 los. Patient was given a copy of the AVS report and appointment schedule per 07/09/16 los. °

## 2016-07-09 NOTE — Progress Notes (Signed)
  [facility] OFFICE PROGRESS NOTE   Diagnosis:  Rectal cancer  INTERVAL HISTORY:   Destiny [name] returns as scheduled. She began cycle 4 adjuvant Xeloda on 06/20/2016. She was unable to fill her prescription for Xeloda due to the cost. She had some "leftover" pills from previous cycles and was able to complete about 10 days. She denies nausea/vomiting. No mouth sores. No diarrhea. No hand or foot pain or redness. She continues to have intermittent pain at the right abdomen lateral to the ostomy site. The pain has been occurring for many months. She takes oxycodone as needed. She is also tried Tylenol. She reports recent urinary frequency.  Objective:  Vital signs in last 24 hours:  Blood pressure (!) 126/51, pulse 91, temperature 98.4 F (36.9 C), temperature source Oral, resp. rate 18, height _0  (1.575 m), weight 197 lb 1.6 oz (89.4 kg), SpO2 100 %.    HEENT: No thrush or ulcers. Resp: Lungs clear bilaterally. Cardio: Regular rate and rhythm. GI: Abdomen is soft. No hepatomegaly. Right abdomen ileostomy. Tender to light touch right abdomen lateral to the ostomy site. Vascular: No leg edema. Skin: Palms without erythema. No rash.    Lab Results:  Lab Results  Component Value Date   WBC 5.0 07/09/2016   HGB 11.7 07/09/2016   HCT 33.8 (L) 07/09/2016   MCV 84.5 07/09/2016   PLT 286 07/09/2016   NEUTROABS 3.8 07/09/2016    Imaging:  No results found.  Medications: I have reviewed the patient's current medications.  Assessment/Plan: 1. Rectal cancer-clinical stage III (T3 N1)  No loss of mismatch repair protein expression  T3 N1 tumor beginning at 2 cm from the anal verge on an MRI of the pelvis 11/08/2015  CTs of the chest, abdomen, and pelvis 11/01/2015 with no evidence of distant metastatic disease  Initiation of neoadjuvant radiation/Xeloda 11/25/2015; completion of radiation/Xeloda 01/02/2016  Low anterior resection with a coloanal anastomosis  02/26/2016,ypT2,ypN0. Grade 3 adenocarcinoma, negative resection margins09/27/2017  No BRAF, KRAS, NRAS mutation; Microsatellite status could not be determined  Cycle 1 adjuvant Xeloda 04/14/2016  Cycle 2 adjuvant Xeloda 05/08/2016  Cycle 3 adjuvant Xeloda 05/29/2016  Cycle 4 adjuvant Xeloda 06/20/2016 (completed approximately 10 days)  2. History of hypertension  3. Left ureteral injury at the low anterior resection-status post a left ureteral neocystostomy and left double-J stent placement 02/26/2016  4. Hyperthyroidism status post radioactive iodine 05/12/2016    Disposition: [name] appears stable. She has completed 4 cycles of adjuvant Xeloda. Cycle 4 was only partially completed due to the Xeloda being cost prohibitive. The [facility] pharmacist is trying to find some financial assistance so she can complete the fifth and final cycle of adjuvant Xeloda. She is due to begin cycle 07/11/2016. She will return for a follow-up visit in one month.  We will check a urinalysis today to evaluate the urinary frequency.  Ned Card ANP/GNP-BC   07/09/2016  10:37 AM

## 2016-07-09 NOTE — Telephone Encounter (Signed)
Called to inform pt she has a UTI and to see where to send her antibiotic. Left message for pt to call back.

## 2016-07-10 ENCOUNTER — Telehealth: Payer: Self-pay

## 2016-07-10 NOTE — Telephone Encounter (Signed)
Patient called back, informed her of UTI - confirmed pharmacy for rx. Informed pt a culture was sent and would get the results the beginning of next week but to begin the abx. Called in [place] to [facility] per NP order.

## 2016-07-13 ENCOUNTER — Telehealth: Payer: Self-pay | Admitting: *Deleted

## 2016-07-13 NOTE — Telephone Encounter (Signed)
Call received from patient stating that she is not able to afford her Xeloda.  She states that [facility] cost is $241.00 and [facility] is $244.00.  Informed patient that I would check costs with oral chemo pharmacist and get back with her with further information.

## 2016-07-15 ENCOUNTER — Telehealth: Payer: Self-pay

## 2016-07-15 NOTE — Telephone Encounter (Signed)
Patient called about xeloda medication. Informed her per Denyse Amass [facility] patient assistance called and left her a message yesterday, gave pt return number [phone] pt assistance. Informed her it was taken care of but the pharmacy needs to speak with her. Pt verbalized understanding and states she will call them.

## 2016-07-22 ENCOUNTER — Other Ambulatory Visit: Payer: Self-pay | Admitting: General Surgery

## 2016-07-22 NOTE — H&P (Signed)
History of Present Illness Destiny Ruff MD; Q000111Q 2:12 PM) The patient is a 51 year old female who presents with colorectal cancer. 51 year old female with several months of rectal bleeding who underwent a colonoscopy with Dr. Havery Moros. An ulcerated nonobstructing mass was found in the rectum approximately 2 cm from anal verge; biopsies confirm adenocarcinoma. CT scan of the chest abdomen and pelvis show no signs of metastatic disease. CEA was normal. MR of the pelvis for staging purposes shows a T3 N1 tumor consistent with stage III disease. She completed chemotherapy and radiation neoadjuvantly. She had minimal complications from this. She underwent a low anterior resection in late September 2017. She comes in today for follow-up. She is currently on adjuvant Xeloda, completeing cycle 5/5. She denies any rectal pain. She does c/o a burning pain in her RLQ   Problem List/Past Medical Destiny Ruff, MD; Q000111Q 2:12 PM) RECTAL CANCER (C[facility])  Past Surgical History Destiny Ruff, MD; Q000111Q 2:12 PM) Hysterectomy (not due to cancer) - Complete  Diagnostic Studies History Destiny Ruff, MD; Q000111Q 2:12 PM) Colonoscopy within last year Mammogram 1-3 years ago Pap Smear 1-5 years ago  Allergies Mammie Lorenzo, LPN; 075-GRM 075-GRM PM) No Known Drug Allergies 11/11/2015  Medication History Mammie Lorenzo, LPN; 075-GRM 579FGE PM) Tylenol (325MG  Capsule, Oral) Active. Methimazole (20MG  Tablet, Oral) Active. Ativan (1MG  Tablet, Oral) Active. Gabapentin (600MG  Tablet, Oral) Active. Iron (324MG  Tablet, Oral) Active. Zoloft (100MG  Tablet, Oral) Active. Methocarbamol (500MG  Tablet, Oral) Active. Metoprolol Succinate ER (25MG  Tablet ER 24HR, Oral) Active. Nitrofurantoin Monohyd Macro (100MG  Capsule, Oral) Active. Medications Reconciled  Social History Destiny Ruff, MD; Q000111Q 2:12 PM) Alcohol use Occasional alcohol use. Caffeine use Carbonated  beverages, Coffee, Tea. Illicit drug use Prefer to discuss with provider. Tobacco use Never smoker.  Family History Destiny Ruff, MD; Q000111Q 2:12 PM) Alcohol Abuse Brother. Arthritis Mother. Colon Cancer Family Members In General. Migraine Headache Sister. Thyroid problems Mother. Colon Polyps Family Members In General. Depression Sister. Hypertension Father, Mother.  Pregnancy / Birth History Destiny Ruff, MD; Q000111Q 2:12 PM) Maternal age 19-20 Para 59 Gravida 3 Age at menarche 42 years. Age of menopause 75-50  Other Problems Destiny Ruff, MD; Q000111Q 2:12 PM) Thyroid Disease Colon Cancer Depression High blood pressure Back Pain Anxiety Disorder     Review of Systems Destiny Ruff MD; Q000111Q 2:12 PM) General Present- Fatigue, Night Sweats and Weight Gain. Not Present- Appetite Loss, Chills, Fever and Weight Loss. Skin Not Present- Change in Wart/Mole, Dryness, Hives, Jaundice, New Lesions, Non-Healing Wounds, Rash and Ulcer. HEENT Present- Seasonal Allergies and Wears glasses/contact lenses. Not Present- Earache, Hearing Loss, Hoarseness, Nose Bleed, Oral Ulcers, Ringing in the Ears, Sinus Pain, Sore Throat, Visual Disturbances and Yellow Eyes. Respiratory Not Present- Bloody sputum, Chronic Cough, Difficulty Breathing, Snoring and Wheezing. Cardiovascular Present- Leg Cramps. Not Present- Chest Pain, Difficulty Breathing Lying Down, Palpitations, Rapid Heart Rate, Shortness of Breath and Swelling of Extremities. Gastrointestinal Present- Bloody Stool, Change in Bowel Habits and Excessive gas. Not Present- Abdominal Pain, Bloating, Chronic diarrhea, Constipation, Difficulty Swallowing, Gets full quickly at meals, Hemorrhoids, Indigestion, Nausea, Rectal Pain and Vomiting. Female Genitourinary Not Present- Frequency, Nocturia, Painful Urination, Pelvic Pain and Urgency. Musculoskeletal Present- Back Pain. Not Present- Joint Pain, Joint  Stiffness, Muscle Pain, Muscle Weakness and Swelling of Extremities. Neurological Present- Headaches and Tingling. Not Present- Decreased Memory, Fainting, Numbness, Seizures, Tremor, Trouble walking and Weakness. Psychiatric Present- Anxiety and Depression. Not Present- Bipolar, Change in Sleep Pattern, Fearful and Frequent crying. Endocrine Present- Cold  Intolerance, Hair Changes and Hot flashes. Not Present- Excessive Hunger, Heat Intolerance and New Diabetes. Hematology Present- Easy Bruising. Not Present- Excessive bleeding, Gland problems, HIV and Persistent Infections.  Vitals Claiborne Billings Dockery LPN; 075-GRM 075-GRM PM) 07/22/2016 1:56 PM Weight: 194.6 lb Height: 62in Body Surface Area: 1.89 m Body Mass Index: 35.59 kg/m  Temp.: 97.32F(Oral)  Pulse: 62 (Regular)  BP: 126/82 (Sitting, Left Arm, Standard)      Physical Exam Destiny Ruff MD; Q000111Q 2:13 PM)  General Mental Status-Alert. General Appearance-Not in acute distress. Build & Nutrition-Well nourished. Posture-Normal posture. Gait-Normal.  Head and Neck Head-normocephalic, atraumatic with no lesions or palpable masses. Trachea-midline.  Chest and Lung Exam Chest and lung exam reveals -on auscultation, normal breath sounds, no adventitious sounds and normal vocal resonance.  Cardiovascular Cardiovascular examination reveals -normal heart sounds, regular rate and rhythm with no murmurs.  Abdomen Inspection Inspection of the abdomen reveals - No Hernias. Palpation/Percussion Palpation and Percussion of the abdomen reveal - Soft, Non Tender, No Rigidity (guarding), No hepatosplenomegaly and No Palpable abdominal masses.  Rectal Anorectal Exam Internal - Note: anastomosis appears to be healed. tender to palpation. positive anal gape.  Neurologic Neurologic evaluation reveals -alert and oriented x 3 with no impairment of recent or remote memory, normal attention span and ability  to concentrate, normal sensation and normal coordination.  Musculoskeletal Normal Exam - Bilateral-Upper Extremity Strength Normal and Lower Extremity Strength Normal.    Assessment & Plan Destiny Ruff MD; Q000111Q 2:10 PM)  RECTAL CANCER (C[facility]) Impression: 51 year old female status post low anterior resection with coloanal anastomosis and diverting loop ileostomy who presents to the office for evaluation for reversal of her ileostomy. She will complete her last dose of chemotherapy in approximately 1 week. We will plan on doing her surgery approximately 2-3 weeks after that. We discussed the possibility of incontinence and frequent bowel movements after reversal.  Other risks include bleeding, infection, bowel leak and hernia.

## 2016-07-24 ENCOUNTER — Telehealth: Payer: Self-pay | Admitting: *Deleted

## 2016-07-24 NOTE — Telephone Encounter (Signed)
Received phone and faxed refill request from [facility] for Xeloda. Called pt, she reports she has a 2 week supply. Will refill at next office visit. Pharmacy notified.

## 2016-08-04 ENCOUNTER — Ambulatory Visit: Payer: BLUE CROSS/BLUE SHIELD | Admitting: Endocrinology

## 2016-08-05 ENCOUNTER — Telehealth: Payer: Self-pay | Admitting: Endocrinology

## 2016-08-05 NOTE — Telephone Encounter (Signed)
Patient no showed today's appt. Please advise on how to follow up. °A. No follow up necessary. °B. Follow up urgent. Contact patient immediately. °C. Follow up necessary. Contact patient and schedule visit in ___ days. °D. Follow up advised. Contact patient and schedule visit in ____weeks. ° °

## 2016-08-06 ENCOUNTER — Ambulatory Visit (HOSPITAL_BASED_OUTPATIENT_CLINIC_OR_DEPARTMENT_OTHER): Payer: BLUE CROSS/BLUE SHIELD | Admitting: Oncology

## 2016-08-06 ENCOUNTER — Telehealth: Payer: Self-pay | Admitting: Oncology

## 2016-08-06 ENCOUNTER — Other Ambulatory Visit (HOSPITAL_BASED_OUTPATIENT_CLINIC_OR_DEPARTMENT_OTHER): Payer: BLUE CROSS/BLUE SHIELD

## 2016-08-06 VITALS — BP 129/81 | HR 70 | Temp 98.4°F | Resp 18 | Wt 203.8 lb

## 2016-08-06 DIAGNOSIS — E039 Hypothyroidism, unspecified: Secondary | ICD-10-CM

## 2016-08-06 DIAGNOSIS — I10 Essential (primary) hypertension: Secondary | ICD-10-CM | POA: Diagnosis not present

## 2016-08-06 DIAGNOSIS — C20 Malignant neoplasm of rectum: Secondary | ICD-10-CM

## 2016-08-06 LAB — COMPREHENSIVE METABOLIC PANEL
ALT: 12 U/L (ref 0–55)
ANION GAP: 8 meq/L (ref 3–11)
AST: 15 U/L (ref 5–34)
Albumin: 3.7 g/dL (ref 3.5–5.0)
Alkaline Phosphatase: 123 U/L (ref 40–150)
BUN: 11.4 mg/dL (ref 7.0–26.0)
CALCIUM: 9.2 mg/dL (ref 8.4–10.4)
CHLORIDE: 106 meq/L (ref 98–109)
CO2: 25 mEq/L (ref 22–29)
Creatinine: 0.7 mg/dL (ref 0.6–1.1)
EGFR: >90 mL/min/{1.73_m2} (ref >90)
Glucose: 125 mg/dl (ref 70–140)
POTASSIUM: 4.1 meq/L (ref 3.5–5.1)
Sodium: 139 mEq/L (ref 136–145)
Total Bilirubin: 0.44 mg/dL (ref 0.20–1.20)
Total Protein: 7.3 g/dL (ref 6.4–8.3)

## 2016-08-06 LAB — CBC WITH DIFFERENTIAL/PLATELET
BASO%: 0.2 % (ref 0.0–2.0)
BASOS ABS: 0 10*3/uL (ref 0.0–0.1)
EOS%: 4.1 % (ref 0.0–7.0)
Eosinophils Absolute: 0.2 10*3/uL (ref 0.0–0.5)
HEMATOCRIT: 33.5 % — AB (ref 34.8–46.6)
HGB: 11.8 g/dL (ref 11.6–15.9)
LYMPH#: 0.7 10*3/uL — AB (ref 0.9–3.3)
LYMPH%: 13.6 % — ABNORMAL LOW (ref 14.0–49.7)
MCH: 30.2 pg (ref 25.1–34.0)
MCHC: 35.2 g/dL (ref 31.5–36.0)
MCV: 85.7 fL (ref 79.5–101.0)
MONO#: 0.4 10*3/uL (ref 0.1–0.9)
MONO%: 6.7 % (ref 0.0–14.0)
NEUT#: 4.1 10*3/uL (ref 1.5–6.5)
NEUT%: 75.4 % (ref 38.4–76.8)
PLATELETS: 288 10*3/uL (ref 145–400)
RBC: 3.91 10*6/uL (ref 3.70–5.45)
RDW: 16.6 % — ABNORMAL HIGH (ref 11.2–14.5)
WBC: 5.4 10*3/uL (ref 3.9–10.3)

## 2016-08-06 LAB — CEA (IN HOUSE-CHCC): CEA (CHCC-In House): 0.55 ng/mL (ref 0.00–5.00)

## 2016-08-06 NOTE — Telephone Encounter (Signed)
Please come back for a follow-up appointment in 6 weeks  

## 2016-08-06 NOTE — Progress Notes (Signed)
  [facility] OFFICE PROGRESS NOTE   Diagnosis: Rectal cancer  INTERVAL HISTORY:   Ms. Molden returns as scheduled. She completed the final cycle of adjuvant Xeloda on 08/01/2016. No mouth sores, diarrhea, or hand/foot pain. She is scheduled for reversal of the ileostomy on 09/02/2016.  Objective:  Vital signs in last 24 hours:  Blood pressure 129/81, pulse 70, temperature 98.4 F (36.9 C), temperature source Oral, resp. rate 18, weight 203 lb 12.8 oz (92.4 kg), SpO2 100 %.    HEENT: No thrush or ulcers Lymphatics: No cervical, supraclavicular, or inguinal nodes Resp: Lungs clear bilaterally Cardio: Regular rate and rhythm GI: No hepatosplenomegaly, no mass, nontender, right lower quadrant ileostomy with round stool Vascular: No leg edema   Lab Results:  Lab Results  Component Value Date   WBC 5.4 08/06/2016   HGB 11.8 08/06/2016   HCT 33.5 (L) 08/06/2016   MCV 85.7 08/06/2016   PLT 288 08/06/2016   NEUTROABS 4.1 08/06/2016     Medications: I have reviewed the patient's current medications.  Assessment/Plan: 1. Rectal cancer-clinical stage III (T3 N1)  No loss of mismatch repair protein expression  T3 N1 tumor beginning at 2 cm from the anal verge on an MRI of the pelvis 11/08/2015  CTs of the chest, abdomen, and pelvis 11/01/2015 with no evidence of distant metastatic disease  Initiation of neoadjuvant radiation/Xeloda 11/25/2015; completion of radiation/Xeloda 01/02/2016  Low anterior resection with a coloanal anastomosis 02/26/2016,ypT2,ypN0. Grade 3 adenocarcinoma, negative resection margins09/27/2017  No BRAF, KRAS, NRAS mutation; Microsatellite status could not be determined  CT abdomen/pelvis 03/30/2016-no evidence of recurrent rectal cancer  Cycle 1 adjuvant Xeloda 04/14/2016  Cycle 2 adjuvant Xeloda 05/08/2016  Cycle 3 adjuvant Xeloda 05/29/2016  Cycle 4 adjuvant Xeloda 06/20/2016 (completed approximately 10 days)  Cycle 5  adjuvant Xeloda started approximately 07/19/2016  2. History of hypertension  3. Left ureteral injury at the low anterior resection-status post a left ureteral neocystostomy and left double-J stent placement 02/26/2016  4. Hyperthyroidism status post radioactive iodine 05/12/2016     Disposition:  Ms. Selvy has completed the planned course of adjuvant chemotherapy. She will undergo ileostomy reversal on 09/02/2016. She will return for an office visit and CEA in 3 months. We will discuss the timing of a surveillance CT scan when she returns in 3 months.  15 minutes were spent with the patient today. The majority of the time was used for counseling and coordination of care.  Betsy Coder, MD  08/06/2016  11:34 AM

## 2016-08-06 NOTE — Telephone Encounter (Signed)
Appointments scheduled per 08/06/16 los. Patient was given a copy of the AVS report and appointment schedule per 08/06/16 los. °

## 2016-08-21 NOTE — Patient Instructions (Addendum)
Destiny [name]  08/21/2016   Your procedure is scheduled on: 09-02-16  Report to [facility]  Entrance take [facility]  elevators to 3rd floor to  [place] at 12PM.  Call this number if you have problems the morning of surgery [phone]   Remember: ONLY 1 PERSON MAY GO WITH YOU TO SHORT STAY TO GET  READY MORNING OF [place].   Do not eat food :After Midnight. You may have clear liquids from midnight until 8am day of surgery. Nothing by mouth after 8am!!     Take these medicines the morning of surgery with A SIP OF WATER: gabapentin(Neurontin), sertraline(Zoloft), tylenol as needed, LEVOTHYROXINE(SYNTHROID)                                You may not have any metal on your body including hair pins and              piercings  Do not wear jewelry, make-up, lotions, powders or perfumes, deodorant             Do not wear nail polish.  Do not shave  48 hours prior to surgery.              Men may shave face and neck.   Do not bring valuables to the hospital. [place].  Contacts, dentures or bridgework may not be worn into surgery.  Leave suitcase in the car. After surgery it may be brought to your room.               Please read over the following fact sheets you were given: _____________________________________________________________________                CLEAR LIQUID DIET   Foods Allowed                                                                     Foods Excluded  Coffee and tea, regular and decaf                             liquids that you cannot  Plain Jell-O in any flavor                                             see through such as: Fruit ices (not with fruit pulp)                                     milk, soups, orange juice  Iced Popsicles                                    All solid food Carbonated  beverages, regular and diet                                    Cranberry,  grape and apple juices Sports drinks like Gatorade Lightly seasoned clear broth or consume(fat free) Sugar, honey syrup  Sample Menu Breakfast                                Lunch                                     Supper Cranberry juice                    Beef broth                            Chicken broth Jell-O                                     Grape juice                           Apple juice Coffee or tea                        Jell-O                                      Popsicle                                                Coffee or tea                        Coffee or tea  _____________________________________________________________________  [facility] - Preparing for Surgery Before surgery, you can play an important role.  Because skin is not sterile, your skin needs to be as free of germs as possible.  You can reduce the number of germs on your skin by washing with CHG (chlorahexidine gluconate) soap before surgery.  CHG is an antiseptic cleaner which kills germs and bonds with the skin to continue killing germs even after washing. Please DO NOT use if you have an allergy to CHG or antibacterial soaps.  If your skin becomes reddened/irritated stop using the CHG and inform your nurse when you arrive at Short Stay. Do not shave (including legs and underarms) for at least 48 hours prior to the first CHG shower.  You may shave your face/neck. Please follow these instructions carefully:  1.  Shower with CHG Soap the night before surgery and the  morning of Surgery.  2.  If you choose to wash your hair, wash your hair first as usual with your  normal  shampoo.  3.  After you shampoo, rinse your hair and body thoroughly to remove the  shampoo.  4.  Use CHG as you would any other liquid soap.  You can apply chg directly  to the skin and wash                       Gently with a scrungie or clean washcloth.  5.  Apply the CHG Soap to your body ONLY FROM THE NECK  DOWN.   Do not use on face/ open                           Wound or open sores. Avoid contact with eyes, ears mouth and genitals (private parts).                       Wash face,  Genitals (private parts) with your normal soap.             6.  Wash thoroughly, paying special attention to the area where your surgery  will be performed.  7.  Thoroughly rinse your body with warm water from the neck down.  8.  DO NOT shower/wash with your normal soap after using and rinsing off  the CHG Soap.                9.  Pat yourself dry with a clean towel.            10.  Wear clean pajamas.            11.  Place clean sheets on your bed the night of your first shower and do not  sleep with pets. Day of Surgery : Do not apply any lotions/deodorants the morning of surgery.  Please wear clean clothes to the hospital/surgery center.  FAILURE TO FOLLOW THESE INSTRUCTIONS MAY RESULT IN THE CANCELLATION OF YOUR SURGERY PATIENT SIGNATURE_________________________________  NURSE SIGNATURE__________________________________  ________________________________________________________________________

## 2016-08-21 NOTE — Progress Notes (Signed)
CMP, CBCdiff,  08-06-16 epic CT chest 11-01-15 epic ECHO 11-20-15 epic EKG 02-10-16 epic

## 2016-08-24 ENCOUNTER — Encounter: Payer: Self-pay | Admitting: Endocrinology

## 2016-08-24 ENCOUNTER — Ambulatory Visit (INDEPENDENT_AMBULATORY_CARE_PROVIDER_SITE_OTHER): Payer: BLUE CROSS/BLUE SHIELD | Admitting: Endocrinology

## 2016-08-24 VITALS — BP 136/92 | HR 66 | Ht 62.0 in | Wt 208.0 lb

## 2016-08-24 DIAGNOSIS — E039 Hypothyroidism, unspecified: Secondary | ICD-10-CM | POA: Insufficient documentation

## 2016-08-24 DIAGNOSIS — E890 Postprocedural hypothyroidism: Secondary | ICD-10-CM

## 2016-08-24 DIAGNOSIS — E0590 Thyrotoxicosis, unspecified without thyrotoxic crisis or storm: Secondary | ICD-10-CM

## 2016-08-24 LAB — T4, FREE: Free T4: 0.2 ng/dL — ABNORMAL LOW (ref 0.60–1.60)

## 2016-08-24 LAB — TSH: TSH: 15.31 u[IU]/mL — ABNORMAL HIGH (ref 0.35–4.50)

## 2016-08-24 MED ORDER — LEVOTHYROXINE SODIUM 100 MCG PO TABS: 100.0000 ug | ORAL_TABLET | Freq: Every day | ORAL | 3 refills | Status: DC

## 2016-08-24 NOTE — Patient Instructions (Signed)
blood tests are requested for you today.  We'll let you know about the results.  Please come back for a follow-up appointment in 6 weeks.  

## 2016-08-24 NOTE — Progress Notes (Signed)
Subjective:    Patient ID: Destiny [name], female    DOB: Jun 25, 1965, 51 y.o.   MRN: [id number]  HPI Pt returns for f/u of hyperthyroidism (dx'ed 2016; probably due to [place] dz; she has never had dedicated thyroid imaging; tapazole was chosen as initial rx, but she eventually had RAI in Dec of 2017).  she feels well in general, except for weight gain.   Past Medical History:  Diagnosis Date  . Cancer ([place]) 10/29/15   rectal invasice adenocarcinoma   . Chronic back pain   . Depression   . Hypertension   . Hyperthyroidism   . Rectal cancer ([place])    Adenocarcinoma dx 10/29/15; Stage III, T3, N1, MO    Past Surgical History:  Procedure Laterality Date  . ABDOMINAL HYSTERECTOMY     vaginal   . BLADDER SURGERY    . EYE SURGERY    . ILEOSTOMY    . URETERAL REIMPLANTION Left 02/26/2016   Procedure: left ureteral neocystotomy,double J stent placement, closure vaginal cuff;  Surgeon: Ardis Hughs, MD;  Location: [facility];  Service: Urology;  Laterality: Left;  Marland Kitchen VAGINAL HYSTERECTOMY    . XI ROBOTIC ASSISTED LOWER ANTERIOR RESECTION N/A 02/26/2016   Procedure: XI ROBOTIC ASSISTED LOWER ANTERIOR RESECTION, diverting LOOP ILEOSTOMY, mobilization splenic flexure, omentopexy;  Surgeon: Leighton Ruff, MD;  Location: [facility];  Service: General;  Laterality: N/A;    Social History   Social History  . Marital status: Divorced    Spouse name: N/A  . Number of children: 3  . Years of education: N/A   Occupational History  . Not on file.   Social History Main Topics  . Smoking status: Never Smoker  . Smokeless tobacco: Never Used  . Alcohol use No     Comment: occ  . Drug use: No  . Sexual activity: Yes    Birth control/ protection: Surgical   Other Topics Concern  . Not on file   Social History Narrative  . No narrative on file    Current Outpatient Prescriptions on File Prior to Visit  Medication Sig Dispense Refill  . acetaminophen (TYLENOL) 500 MG tablet Take 1,000 mg by  mouth every 8 (eight) hours as needed for moderate pain.    . capecitabine (XELODA) 500 MG tablet Take 4 tabs (2,000 mg) QAM 3 tabs (1,500 mg) QPM. Take after meals Days 1-14 of 21 day cycle 98 tablet 0  . feeding supplement, ENSURE ENLIVE, (ENSURE ENLIVE) LIQD Take 237 mLs by mouth 2 (two) times daily between meals. (Patient taking differently: Take 237 mLs by mouth 2 (two) times daily. ) 237 mL 12  . ferrous sulfate 325 (65 FE) MG tablet Take 325 mg by mouth daily with breakfast.    . gabapentin (NEURONTIN) 300 MG capsule 1 CAPSULE TODAY, TWICE A DAY FRIDAY, THEN 3 TIMES A DAY FOR 30 DAYS (Patient taking differently: TAKE 1 CAPSULE (300 MG) BY MOUTH 3 TIMES DAILY) 93 capsule 1  . lidocaine-prilocaine (EMLA) cream Apply 1 application topically as needed. 30 g 2  . loperamide (IMODIUM) 2 MG capsule Take 1 capsule (2 mg total) by mouth 3 (three) times daily as needed for diarrhea or loose stools. (Patient taking differently: Take 2-4 mg by mouth 3 (three) times daily as needed for diarrhea or loose stools. ) 30 capsule 0  . LORazepam (ATIVAN) 0.5 MG tablet TAKE  (1)  TABLET TWICE A DAY. (Patient taking differently: TAKE 1 TABLET (0.5 MG) BY MOUTH AT  BEDTIME.) 60 tablet 1  . methocarbamol (ROBAXIN) 500 MG tablet TAKE (2) TABLETS 4 TIMES A DAY AS NEEDED FOR MUSCLE RELAXATION. (Patient taking differently: TAKE1 TABLET (500 MG) BY MOUTH TWICE DAILY) 60 tablet 0  . nitrofurantoin, macrocrystal-monohydrate, (MACROBID) 100 MG capsule Take 1 capsule (100 mg total) by mouth 2 (two) times daily. 14 capsule 0  . nystatin cream (MYCOSTATIN) Apply 1 application topically 2 (two) times daily. (Patient taking differently: Apply 1 application topically 2 (two) times daily as needed (for dry/itching skin.). ) 30 g 0  . ondansetron (ZOFRAN) 8 MG tablet Take 1 tablet (8 mg total) by mouth every 8 (eight) hours as needed for nausea or vomiting. (Patient taking differently: Take 8 mg by mouth every 8 (eight) hours as needed  for nausea or vomiting. For nausea/vomiting) 30 tablet 0  . oxyCODONE (OXY IR/ROXICODONE) 5 MG immediate release tablet Take 1 tablet (5 mg total) by mouth every 12 (twelve) hours as needed for severe pain. 20 tablet 0  . sertraline (ZOLOFT) 100 MG tablet Take 1 tablet (100 mg total) by mouth daily. 30 tablet 5  . traZODone (DESYREL) 50 MG tablet Take 0.5-1 tablets (25-50 mg total) by mouth at bedtime as needed for sleep. 30 tablet 3  . vitamin C (ASCORBIC ACID) 500 MG tablet Take 500 mg by mouth daily.     No current facility-administered medications on file prior to visit.     Allergies  Allergen Reactions  . Latex Itching and Rash    Family History  Problem Relation Age of Onset  . Thyroid disease Mother   . Lung cancer Father     lung  . Cirrhosis Brother     BP (!) 136/92   Pulse 66   Ht 5\' 2"  (1.575 m)   Wt 208 lb (94.3 kg)   LMP  (LMP Unknown) Comment: perimenopausal  SpO2 98%   BMI 38.04 kg/m   Review of Systems Denies dry skin.     Objective:   Physical Exam VITAL SIGNS:  See vs page GENERAL: no distress head: no deformity  eyes: no periorbital swelling, no proptosis  NECK: thyroid is again noted to be slightly and diffusely enlarged.  No palpable nodule.   Skin: not diaphoretic.  Neuro: no tremor.   Lab Results  Component Value Date   TSH 15.31 (H) 08/24/2016   T4TOTAL 32.3 (HH) 02/18/2015      Assessment & Plan:  Post-RAI hypothyroidism, new.  I have sent a prescription to your pharmacy, for synthroid.

## 2016-08-25 ENCOUNTER — Encounter (HOSPITAL_COMMUNITY)
Admission: RE | Admit: 2016-08-25 | Discharge: 2016-08-25 | Disposition: A | Discharge status: Home / Self Care | Payer: BLUE CROSS/BLUE SHIELD | Source: Ambulatory Visit | Attending: General Surgery | Admitting: General Surgery

## 2016-08-25 ENCOUNTER — Encounter (HOSPITAL_COMMUNITY): Payer: Self-pay

## 2016-08-25 DIAGNOSIS — Z01818 Encounter for other preprocedural examination: Secondary | ICD-10-CM | POA: Diagnosis not present

## 2016-08-27 ENCOUNTER — Encounter: Payer: Self-pay | Admitting: Gastroenterology

## 2016-09-02 ENCOUNTER — Encounter (HOSPITAL_COMMUNITY)
Admission: RE | Disposition: A | Discharge status: Home / Self Care | Payer: Self-pay | Source: Ambulatory Visit | Attending: General Surgery

## 2016-09-02 ENCOUNTER — Inpatient Hospital Stay (HOSPITAL_COMMUNITY): Payer: BLUE CROSS/BLUE SHIELD | Admitting: Anesthesiology

## 2016-09-02 ENCOUNTER — Encounter (HOSPITAL_COMMUNITY): Payer: Self-pay

## 2016-09-02 ENCOUNTER — Inpatient Hospital Stay (HOSPITAL_COMMUNITY)
Admission: RE | Admit: 2016-09-02 | Discharge: 2016-09-10 | DRG: 331 | Disposition: A | Discharge status: Home / Self Care | Payer: BLUE CROSS/BLUE SHIELD | Source: Ambulatory Visit | Attending: General Surgery | Admitting: General Surgery

## 2016-09-02 DIAGNOSIS — Z432 Encounter for attention to ileostomy: Secondary | ICD-10-CM | POA: Diagnosis present

## 2016-09-02 DIAGNOSIS — F329 Major depressive disorder, single episode, unspecified: Secondary | ICD-10-CM | POA: Diagnosis present

## 2016-09-02 DIAGNOSIS — Z6838 Body mass index (BMI) 38.0-38.9, adult: Secondary | ICD-10-CM | POA: Diagnosis not present

## 2016-09-02 DIAGNOSIS — M549 Dorsalgia, unspecified: Secondary | ICD-10-CM | POA: Diagnosis present

## 2016-09-02 DIAGNOSIS — Z85048 Personal history of other malignant neoplasm of rectum, rectosigmoid junction, and anus: Secondary | ICD-10-CM | POA: Diagnosis not present

## 2016-09-02 DIAGNOSIS — E669 Obesity, unspecified: Secondary | ICD-10-CM | POA: Diagnosis present

## 2016-09-02 DIAGNOSIS — C20 Malignant neoplasm of rectum: Secondary | ICD-10-CM | POA: Diagnosis present

## 2016-09-02 DIAGNOSIS — I10 Essential (primary) hypertension: Secondary | ICD-10-CM | POA: Diagnosis present

## 2016-09-02 DIAGNOSIS — G8929 Other chronic pain: Secondary | ICD-10-CM | POA: Diagnosis present

## 2016-09-02 HISTORY — PX: ILEOSTOMY CLOSURE: SHX1784

## 2016-09-02 HISTORY — PX: RECTAL EXAM UNDER ANESTHESIA: SHX6399

## 2016-09-02 SURGERY — CLOSURE, ILEOSTOMY
Anesthesia: General | Site: Rectum

## 2016-09-02 MED ORDER — HYDROMORPHONE HCL 1 MG/ML IJ SOLN
0.2500 mg | INTRAMUSCULAR | Status: DC | PRN
  Administered 2016-09-02 (×4): 0.5 mg via INTRAVENOUS

## 2016-09-02 MED ORDER — GABAPENTIN 300 MG PO CAPS
300.0000 mg | ORAL_CAPSULE | ORAL | Status: DC
  Filled 2016-09-02: qty 1

## 2016-09-02 MED ORDER — ACETAMINOPHEN 500 MG PO TABS
1000.0000 mg | ORAL_TABLET | ORAL | Status: AC
  Administered 2016-09-02: 1000 mg via ORAL
  Filled 2016-09-02: qty 2

## 2016-09-02 MED ORDER — ENOXAPARIN SODIUM 40 MG/0.4ML ~~LOC~~ SOLN
40.0000 mg | SUBCUTANEOUS | Status: DC
  Administered 2016-09-03 – 2016-09-09 (×7): 40 mg via SUBCUTANEOUS
  Filled 2016-09-02 (×7): qty 0.4

## 2016-09-02 MED ORDER — MIDAZOLAM HCL 2 MG/2ML IJ SOLN
INTRAMUSCULAR | Status: AC
  Filled 2016-09-02: qty 2

## 2016-09-02 MED ORDER — PROPOFOL 10 MG/ML IV BOLUS
INTRAVENOUS | Status: DC | PRN
  Administered 2016-09-02: 150 mg via INTRAVENOUS

## 2016-09-02 MED ORDER — LIDOCAINE 2% (20 MG/ML) 5 ML SYRINGE
INTRAMUSCULAR | Status: AC
  Filled 2016-09-02: qty 5

## 2016-09-02 MED ORDER — LACTATED RINGERS IV SOLN
INTRAVENOUS | Status: DC
  Administered 2016-09-02 (×2): via INTRAVENOUS

## 2016-09-02 MED ORDER — TRAZODONE HCL 50 MG PO TABS: 25.0000 mg | ORAL_TABLET | Freq: Every evening | ORAL | Status: DC | PRN

## 2016-09-02 MED ORDER — CELECOXIB 200 MG PO CAPS
400.0000 mg | ORAL_CAPSULE | ORAL | Status: AC
  Administered 2016-09-02: 400 mg via ORAL
  Filled 2016-09-02: qty 2

## 2016-09-02 MED ORDER — HYDROMORPHONE HCL 1 MG/ML IJ SOLN
0.5000 mg | INTRAMUSCULAR | Status: DC | PRN
  Administered 2016-09-03 – 2016-09-06 (×13): 0.5 mg via INTRAVENOUS
  Filled 2016-09-02 (×13): qty 1

## 2016-09-02 MED ORDER — DIPHENHYDRAMINE HCL 25 MG PO CAPS: 25.0000 mg | ORAL_CAPSULE | Freq: Four times a day (QID) | ORAL | Status: DC | PRN

## 2016-09-02 MED ORDER — LEVOTHYROXINE SODIUM 100 MCG PO TABS
100.0000 ug | ORAL_TABLET | Freq: Every day | ORAL | Status: DC
  Administered 2016-09-03 – 2016-09-10 (×8): 100 ug via ORAL
  Filled 2016-09-02 (×8): qty 1

## 2016-09-02 MED ORDER — PROPOFOL 10 MG/ML IV BOLUS
INTRAVENOUS | Status: AC
  Filled 2016-09-02: qty 20

## 2016-09-02 MED ORDER — HYDROMORPHONE HCL 1 MG/ML IJ SOLN
INTRAMUSCULAR | Status: AC
  Filled 2016-09-02: qty 1

## 2016-09-02 MED ORDER — DEXAMETHASONE SODIUM PHOSPHATE 10 MG/ML IJ SOLN
INTRAMUSCULAR | Status: AC
  Filled 2016-09-02: qty 1

## 2016-09-02 MED ORDER — MIDAZOLAM HCL 5 MG/5ML IJ SOLN
INTRAMUSCULAR | Status: DC | PRN
  Administered 2016-09-02: 2 mg via INTRAVENOUS

## 2016-09-02 MED ORDER — ONDANSETRON HCL 4 MG/2ML IJ SOLN
INTRAMUSCULAR | Status: AC
  Filled 2016-09-02: qty 2

## 2016-09-02 MED ORDER — KCL IN DEXTROSE-NACL 20-5-0.45 MEQ/L-%-% IV SOLN
INTRAVENOUS | Status: DC
  Administered 2016-09-02 – 2016-09-03 (×3): via INTRAVENOUS
  Administered 2016-09-04: 1000 mL via INTRAVENOUS
  Administered 2016-09-05 – 2016-09-07 (×4): via INTRAVENOUS
  Administered 2016-09-08: 1000 mL via INTRAVENOUS
  Administered 2016-09-09: 03:00:00 via INTRAVENOUS
  Filled 2016-09-02 (×15): qty 1000

## 2016-09-02 MED ORDER — CEFOTETAN DISODIUM-DEXTROSE 2-2.08 GM-% IV SOLR
2.0000 g | INTRAVENOUS | Status: AC
  Administered 2016-09-02: 2 g via INTRAVENOUS
  Filled 2016-09-02: qty 50

## 2016-09-02 MED ORDER — ONDANSETRON HCL 4 MG/2ML IJ SOLN
INTRAMUSCULAR | Status: DC | PRN
Start: 2016-09-02 — End: 2016-09-02
  Administered 2016-09-02: 4 mg via INTRAVENOUS

## 2016-09-02 MED ORDER — ROCURONIUM BROMIDE 50 MG/5ML IV SOSY
PREFILLED_SYRINGE | INTRAVENOUS | Status: AC
  Filled 2016-09-02: qty 5

## 2016-09-02 MED ORDER — LIDOCAINE HCL (CARDIAC) 20 MG/ML IV SOLN
INTRAVENOUS | Status: DC | PRN
  Administered 2016-09-02: 60 mg via INTRAVENOUS

## 2016-09-02 MED ORDER — FENTANYL CITRATE (PF) 250 MCG/5ML IJ SOLN
INTRAMUSCULAR | Status: AC
  Filled 2016-09-02: qty 5

## 2016-09-02 MED ORDER — SUGAMMADEX SODIUM 200 MG/2ML IV SOLN
INTRAVENOUS | Status: AC
  Filled 2016-09-02: qty 2

## 2016-09-02 MED ORDER — BUPIVACAINE HCL (PF) 0.25 % IJ SOLN
INTRAMUSCULAR | Status: AC
  Filled 2016-09-02: qty 30

## 2016-09-02 MED ORDER — ALUM & MAG HYDROXIDE-SIMETH 200-200-20 MG/5ML PO SUSP
30.0000 mL | Freq: Four times a day (QID) | ORAL | Status: DC | PRN
  Administered 2016-09-06: 30 mL via ORAL
  Filled 2016-09-02 (×4): qty 30

## 2016-09-02 MED ORDER — DIPHENHYDRAMINE HCL 50 MG/ML IJ SOLN: 25.0000 mg | Freq: Four times a day (QID) | INTRAMUSCULAR | Status: DC | PRN

## 2016-09-02 MED ORDER — ACETAMINOPHEN 500 MG PO TABS
1000.0000 mg | ORAL_TABLET | Freq: Four times a day (QID) | ORAL | Status: AC
  Administered 2016-09-02 – 2016-09-03 (×4): 1000 mg via ORAL
  Filled 2016-09-02 (×4): qty 2

## 2016-09-02 MED ORDER — METHOCARBAMOL 500 MG PO TABS
500.0000 mg | ORAL_TABLET | Freq: Two times a day (BID) | ORAL | Status: DC
  Administered 2016-09-03 – 2016-09-09 (×15): 500 mg via ORAL
  Filled 2016-09-02 (×16): qty 1

## 2016-09-02 MED ORDER — LABETALOL HCL 5 MG/ML IV SOLN
INTRAVENOUS | Status: DC | PRN
  Administered 2016-09-02: 5 mg via INTRAVENOUS

## 2016-09-02 MED ORDER — DEXAMETHASONE SODIUM PHOSPHATE 10 MG/ML IJ SOLN
INTRAMUSCULAR | Status: DC | PRN
  Administered 2016-09-02: 10 mg via INTRAVENOUS

## 2016-09-02 MED ORDER — SERTRALINE HCL 100 MG PO TABS
100.0000 mg | ORAL_TABLET | Freq: Every day | ORAL | Status: DC
  Administered 2016-09-03 – 2016-09-09 (×7): 100 mg via ORAL
  Filled 2016-09-02 (×7): qty 1

## 2016-09-02 MED ORDER — FENTANYL CITRATE (PF) 100 MCG/2ML IJ SOLN
INTRAMUSCULAR | Status: DC | PRN
  Administered 2016-09-02: 100 ug via INTRAVENOUS
  Administered 2016-09-02: 50 ug via INTRAVENOUS
  Administered 2016-09-02: 100 ug via INTRAVENOUS

## 2016-09-02 MED ORDER — BUPIVACAINE HCL (PF) 0.25 % IJ SOLN
INTRAMUSCULAR | Status: DC | PRN
  Administered 2016-09-02: 30 mL

## 2016-09-02 MED ORDER — SUGAMMADEX SODIUM 200 MG/2ML IV SOLN
INTRAVENOUS | Status: DC | PRN
  Administered 2016-09-02: 200 mg via INTRAVENOUS

## 2016-09-02 MED ORDER — ONDANSETRON HCL 4 MG/2ML IJ SOLN
4.0000 mg | Freq: Four times a day (QID) | INTRAMUSCULAR | Status: DC | PRN
  Administered 2016-09-04 – 2016-09-07 (×8): 4 mg via INTRAVENOUS
  Filled 2016-09-02 (×8): qty 2

## 2016-09-02 MED ORDER — ROCURONIUM BROMIDE 100 MG/10ML IV SOLN
INTRAVENOUS | Status: DC | PRN
  Administered 2016-09-02: 20 mg via INTRAVENOUS
  Administered 2016-09-02: 50 mg via INTRAVENOUS

## 2016-09-02 MED ORDER — ONDANSETRON HCL 4 MG PO TABS: 4.0000 mg | ORAL_TABLET | Freq: Four times a day (QID) | ORAL | Status: DC | PRN

## 2016-09-02 MED ORDER — LORAZEPAM 0.5 MG PO TABS
0.5000 mg | ORAL_TABLET | Freq: Every day | ORAL | Status: DC
  Administered 2016-09-03 – 2016-09-09 (×6): 0.5 mg via ORAL
  Filled 2016-09-02 (×8): qty 1

## 2016-09-02 MED ORDER — ALVIMOPAN 12 MG PO CAPS: 12.0000 mg | ORAL_CAPSULE | Freq: Two times a day (BID) | ORAL | Status: DC

## 2016-09-02 MED ORDER — 0.9 % SODIUM CHLORIDE (POUR BTL) OPTIME
TOPICAL | Status: DC | PRN
  Administered 2016-09-02: 2000 mL

## 2016-09-02 MED ORDER — GABAPENTIN 300 MG PO CAPS
300.0000 mg | ORAL_CAPSULE | Freq: Three times a day (TID) | ORAL | Status: DC
  Administered 2016-09-02 – 2016-09-09 (×22): 300 mg via ORAL
  Filled 2016-09-02 (×23): qty 1

## 2016-09-02 SURGICAL SUPPLY — 77 items
APL SKNCLS STERI-STRIP NONHPOA (GAUZE/BANDAGES/DRESSINGS)
BENZOIN TINCTURE PRP APPL 2/3 (GAUZE/BANDAGES/DRESSINGS) ×2 IMPLANT
BLADE HEX COATED 2.75 (ELECTRODE) ×2 IMPLANT
BLADE SURG 15 STRL LF DISP TIS (BLADE) IMPLANT
BLADE SURG 15 STRL SS (BLADE)
BLADE SURG SZ10 CARB STEEL (BLADE) ×2 IMPLANT
BRIEF STRETCH FOR OB PAD LRG (UNDERPADS AND DIAPERS) ×4 IMPLANT
CHLORAPREP W/TINT 26ML (MISCELLANEOUS) ×3 IMPLANT
COVER MAYO STAND STRL (DRAPES) ×4 IMPLANT
COVER SURGICAL LIGHT HANDLE (MISCELLANEOUS) ×4 IMPLANT
DECANTER SPIKE VIAL GLASS SM (MISCELLANEOUS) ×2 IMPLANT
DRAPE LAPAROSCOPIC ABDOMINAL (DRAPES) ×3 IMPLANT
DRAPE SURG IRRIG POUCH 19X23 (DRAPES) ×1 IMPLANT
DRAPE UTILITY XL STRL (DRAPES) ×3 IMPLANT
DRAPE WARM FLUID 44X44 (DRAPE) ×3 IMPLANT
DRSG OPSITE POSTOP 4X10 (GAUZE/BANDAGES/DRESSINGS) IMPLANT
DRSG OPSITE POSTOP 4X6 (GAUZE/BANDAGES/DRESSINGS) IMPLANT
DRSG OPSITE POSTOP 4X8 (GAUZE/BANDAGES/DRESSINGS) IMPLANT
DRSG TELFA 3X8 NADH (GAUZE/BANDAGES/DRESSINGS) ×3 IMPLANT
ELECT PENCIL ROCKER SW 15FT (MISCELLANEOUS) ×1 IMPLANT
ELECT REM PT RETURN 15FT ADLT (MISCELLANEOUS) ×3 IMPLANT
GAUZE SPONGE 4X4 12PLY STRL (GAUZE/BANDAGES/DRESSINGS) ×3 IMPLANT
GAUZE SPONGE 4X4 16PLY XRAY LF (GAUZE/BANDAGES/DRESSINGS) IMPLANT
GAUZE VASELINE 3X9 (GAUZE/BANDAGES/DRESSINGS) IMPLANT
GLOVE BIO SURGEON STRL SZ 6.5 (GLOVE) ×4 IMPLANT
GLOVE BIOGEL PI IND STRL 7.0 (GLOVE) ×2 IMPLANT
GLOVE BIOGEL PI INDICATOR 7.0 (GLOVE) ×6
GOWN STRL REUS W/TWL 2XL LVL3 (GOWN DISPOSABLE) ×4 IMPLANT
GOWN STRL REUS W/TWL XL LVL3 (GOWN DISPOSABLE) ×6 IMPLANT
HANDLE SUCTION POOLE (INSTRUMENTS) ×2 IMPLANT
HOLDER FOLEY CATH W/STRAP (MISCELLANEOUS) ×2 IMPLANT
KIT BASIN OR (CUSTOM PROCEDURE TRAY) ×4 IMPLANT
LEGGING LITHOTOMY PAIR STRL (DRAPES) IMPLANT
LOOP VESSEL MAXI BLUE (MISCELLANEOUS) IMPLANT
MANIFOLD NEPTUNE II (INSTRUMENTS) ×3 IMPLANT
NDL HYPO 25X1 1.5 SAFETY (NEEDLE) ×2 IMPLANT
NDL SAFETY ECLIPSE 18X1.5 (NEEDLE) IMPLANT
NEEDLE HYPO 18GX1.5 SHARP (NEEDLE)
NEEDLE HYPO 25X1 1.5 SAFETY (NEEDLE) IMPLANT
PACK GENERAL/GYN (CUSTOM PROCEDURE TRAY) ×3 IMPLANT
PACK LITHOTOMY IV (CUSTOM PROCEDURE TRAY) ×2 IMPLANT
PAD ABD 8X10 STRL (GAUZE/BANDAGES/DRESSINGS) IMPLANT
PAD DRESSING TELFA 3X8 NADH (GAUZE/BANDAGES/DRESSINGS) IMPLANT
PAD POSITIONING PINK XL (MISCELLANEOUS) ×1 IMPLANT
PENCIL BUTTON HOLSTER BLD 10FT (ELECTRODE) ×2 IMPLANT
SEALER TISSUE X1 CVD JAW (INSTRUMENTS) IMPLANT
SPONGE LAP 18X18 X RAY DECT (DISPOSABLE) ×1 IMPLANT
SPONGE SURGIFOAM ABS GEL 12-7 (HEMOSTASIS) IMPLANT
STAPLER GUN LINEAR PROX 60 (STAPLE) ×1 IMPLANT
STAPLER PROXIMATE 75MM BLUE (STAPLE) ×1 IMPLANT
SUCTION POOLE HANDLE (INSTRUMENTS) ×3
SUT CHROMIC 2 0 SH (SUTURE) IMPLANT
SUT ETHIBOND 0 (SUTURE) IMPLANT
SUT GUT CHROMIC 3 0 (SUTURE) IMPLANT
SUT MON AB 3-0 SH 27 (SUTURE)
SUT MON AB 3-0 SH27 (SUTURE) ×2 IMPLANT
SUT NOVA NAB DX-16 0-1 5-0 T12 (SUTURE) ×1 IMPLANT
SUT NOVA NAB GS-21 0 18 T12 DT (SUTURE) ×6 IMPLANT
SUT PROLENE 2 0 BLUE (SUTURE) IMPLANT
SUT SILK 2 0 (SUTURE) ×3
SUT SILK 2 0 SH CR/8 (SUTURE) ×3 IMPLANT
SUT SILK 2-0 18XBRD TIE 12 (SUTURE) ×2 IMPLANT
SUT SILK 3 0 (SUTURE) ×3
SUT SILK 3 0 SH CR/8 (SUTURE) ×3 IMPLANT
SUT SILK 3-0 18XBRD TIE 12 (SUTURE) ×2 IMPLANT
SUT VIC AB 2-0 SH 18 (SUTURE) IMPLANT
SUT VIC AB 2-0 SH 27 (SUTURE) ×6
SUT VIC AB 2-0 SH 27X BRD (SUTURE) ×4 IMPLANT
SUT VIC AB 4-0 P-3 18XBRD (SUTURE) IMPLANT
SUT VIC AB 4-0 P3 18 (SUTURE) ×3
SUT VIC AB 4-0 PS2 18 (SUTURE) ×3 IMPLANT
SYR CONTROL 10ML LL (SYRINGE) ×3 IMPLANT
TAPE CLOTH SURG 6X10 WHT LF (GAUZE/BANDAGES/DRESSINGS) ×1 IMPLANT
TOWEL OR 17X26 10 PK STRL BLUE (TOWEL DISPOSABLE) ×6 IMPLANT
TOWEL OR NON WOVEN STRL DISP B (DISPOSABLE) ×6 IMPLANT
TRAY FOLEY CATH SILVER 16FR LF (SET/KITS/TRAYS/PACK) ×1 IMPLANT
YANKAUER SUCT BULB TIP 10FT TU (MISCELLANEOUS) ×3 IMPLANT

## 2016-09-02 NOTE — H&P (Signed)
51 year old female with several months of rectal bleeding who underwent a colonoscopy with Dr. Havery Moros. An ulcerated nonobstructing mass was found in the rectum approximately 2 cm from anal verge; biopsies confirm adenocarcinoma. CT scan of the chest abdomen and pelvis show no signs of metastatic disease. CEA was normal. MR of the pelvis for staging purposes shows a T3 N1 tumor consistent with stage III disease. She completed chemotherapy and radiation neoadjuvantly. She had minimal complications from this. She underwent a low anterior resection in late September 2017. She comes in today for follow-up. She has completed her adjuvant Xeloda. She denies any rectal pain.   Past Medical History:  Diagnosis Date  . Cancer ([place]) 10/29/15   rectal invasice adenocarcinoma   . Chronic back pain   . Depression   . Hypertension   . Hyperthyroidism   . Rectal cancer ([place])    Adenocarcinoma dx 10/29/15; Stage III, T3, N1, MO   Past Surgical History:  Procedure Laterality Date  . ABDOMINAL HYSTERECTOMY     vaginal   . BLADDER SURGERY    . EYE SURGERY     LAZY EYE CORRECTION  . ILEOSTOMY    . URETERAL REIMPLANTION Left 02/26/2016   Procedure: left ureteral neocystotomy,double J stent placement, closure vaginal cuff;  Surgeon: Ardis Hughs, MD;  Location: [facility];  Service: Urology;  Laterality: Left;  Marland Kitchen VAGINAL HYSTERECTOMY    . XI ROBOTIC ASSISTED LOWER ANTERIOR RESECTION N/A 02/26/2016   Procedure: XI ROBOTIC ASSISTED LOWER ANTERIOR RESECTION, diverting LOOP ILEOSTOMY, mobilization splenic flexure, omentopexy;  Surgeon: Leighton Ruff, MD;  Location: [facility];  Service: General;  Laterality: N/A;   Social History   Social History  . Marital status: Divorced    Spouse name: N/A  . Number of children: 3  . Years of education: N/A   Occupational History  . Not on file.   Social History Main Topics  . Smoking status: Never Smoker  . Smokeless tobacco: Never Used  . Alcohol use  No     Comment: occ  . Drug use: No  . Sexual activity: Yes    Birth control/ protection: Surgical   Other Topics Concern  . Not on file   Social History Narrative  . No narrative on file   Family History  Problem Relation Age of Onset  . Thyroid disease Mother   . Lung cancer Father     lung  . Cirrhosis Brother      Allergies  No Known Drug Allergies  Medication History  Tylenol (325MG  Capsule, Oral) Active. Methimazole (20MG  Tablet, Oral) Active. Ativan (1MG  Tablet, Oral) Active. Gabapentin (600MG  Tablet, Oral) Active. Iron (324MG  Tablet, Oral) Active. Zoloft (100MG  Tablet, Oral) Active. Methocarbamol (500MG  Tablet, Oral) Active. Metoprolol Succinate ER (25MG  Tablet ER 24HR, Oral) Active. Nitrofurantoin Monohyd Macro (100MG  Capsule, Oral) Active.   BP 129/77   Pulse 66   Temp 98.2 F (36.8 C) (Oral)   Resp 16   Ht 5\' 2"  (1.575 m)   Wt 95 kg (209 lb 6 oz)   LMP  (LMP Unknown) Comment: perimenopausal  SpO2 100%   BMI 38.30 kg/m    Physical Exam General Mental Status-Alert. General Appearance-Consistent with stated age. Hydration-Well hydrated. Voice-Normal.  Chest and Lung Exam Chest and lung exam reveals -quiet, even and easy respiratory effort with no use of accessory muscles. Inspection Chest Wall - Normal. Back - normal.  Abdomen Inspection Skin - Scar - Note: Incision: clean, dry, and intact. Palpation/Percussion Palpation and  Percussion of the abdomen reveal - Soft, Non Tender, No Rebound tenderness and No Rigidity (guarding).  Rectal Anorectal Exam Internal - Note: Patient with healed staple line posteriorly. not tender to palpation. No stricturing noted.    Assessment & Plan RECTAL CANCER (C[facility]) Impression: She is status post ultralow anterior resection for rectal cancer. This was done in September 2017. On exam she appears to finally healed her posterior margin. I believe she is ready for ostomy reversal.  We have  discussed the risks of surgery, which include bleeding, infection, damage to adjacent structures and leak of surgical connections.  I believe she understands this and has agreed to proceed.

## 2016-09-02 NOTE — Anesthesia Preprocedure Evaluation (Signed)
Anesthesia Evaluation  Patient identified by MRN, date of birth, ID band Patient awake    Reviewed: Allergy & Precautions, NPO status , Patient's Chart, lab work & pertinent test results  Airway Mallampati: II  TM Distance: >3 FB Neck ROM: Full    Dental  (+) Partial Lower, Dental Advisory Given,    Pulmonary neg pulmonary ROS,    breath sounds clear to auscultation       Cardiovascular hypertension, Pt. on medications  Rhythm:Regular Rate:Normal     Neuro/Psych PSYCHIATRIC DISORDERS Depression negative neurological ROS     GI/Hepatic negative GI ROS, Neg liver ROS,   Endo/Other  Hyperthyroidism   Renal/GU negative Renal ROS  negative genitourinary   Musculoskeletal negative musculoskeletal ROS (+)   Abdominal   Peds negative pediatric ROS (+)  Hematology negative hematology ROS (+)   Anesthesia Other Findings   Reproductive/Obstetrics negative OB ROS                             01/2016 EKG: normal sinus rhythm.  10/2015 Echo - Left ventricle: The cavity size was normal. Wall thickness was   normal. Systolic function was normal. The estimated ejection   fraction was in the range of 60% to 65%. Wall motion was normal;   there were no regional wall motion abnormalities. Mild diastolic   dysfunction. Normal filling pressures. - Aortic valve: Mildly calcified annulus. Trileaflet. - Mitral valve: Mildly thickened leaflets . There was mild   regurgitation. - Left atrium: The atrium was mildly dilated.  Anesthesia Physical  Anesthesia Plan  ASA: II  Anesthesia Plan: General   Post-op Pain Management:    Induction: Intravenous  Airway Management Planned: Oral ETT  Additional Equipment:   Intra-op Plan:   Post-operative Plan: Extubation in OR  Informed Consent: I have reviewed the patients History and Physical, chart, labs and discussed the procedure including the risks,  benefits and alternatives for the proposed anesthesia with the patient or authorized representative who has indicated his/her understanding and acceptance.   Dental advisory given  Plan Discussed with: CRNA  Anesthesia Plan Comments:         Anesthesia Quick Evaluation

## 2016-09-02 NOTE — Anesthesia Procedure Notes (Signed)
Procedure Name: Intubation Date/Time: 09/02/2016 1:24 PM Performed by: Glory Buff Pre-anesthesia Checklist: Patient identified, Emergency Drugs available, Suction available and Patient being monitored Patient Re-evaluated:Patient Re-evaluated prior to inductionOxygen Delivery Method: Circle system utilized Preoxygenation: Pre-oxygenation with 100% oxygen Intubation Type: IV induction Ventilation: Mask ventilation without difficulty Laryngoscope Size: Miller and 3 Grade View: Grade I Tube type: Oral Tube size: 7.0 mm Number of attempts: 1 Airway Equipment and Method: Stylet and Oral airway Placement Confirmation: ETT inserted through vocal cords under direct vision,  positive ETCO2 and breath sounds checked- equal and bilateral Secured at: 20 cm Tube secured with: Tape Dental Injury: Teeth and Oropharynx as per pre-operative assessment

## 2016-09-02 NOTE — Transfer of Care (Signed)
Immediate Anesthesia Transfer of Care Note  Patient: Destiny [name]  Procedure(s) Performed: Procedure(s): ILEOSTOMY REVERSAL (N/A) ANAL EXAM UNDER ANESTHESIA (N/A)  Patient Location: PACU  Anesthesia Type:General  Level of Consciousness: awake, alert  and oriented  Airway & Oxygen Therapy: Patient Spontanous Breathing and Patient connected to face mask oxygen  Post-op Assessment: Report given to RN and Post -op Vital signs reviewed and stable  Post vital signs: Reviewed and stable  Last Vitals:  Vitals:   09/02/16 1141  BP: 129/77  Pulse: 66  Resp: 16  Temp: 36.8 C    Last Pain:  Vitals:   09/02/16 1154  TempSrc:   PainSc: 5       Patients Stated Pain Goal: 4 (31/28/11 8867)  Complications: No apparent anesthesia complications

## 2016-09-02 NOTE — Progress Notes (Signed)
PHARMACY NOTE    Alvimopan (Entereg) Restrictions per  P&T and MEC: . Patients must have partial large or small bowel resection with primary anastomosis. . Patients must be capable of taking alvimopan PO; not administered via NG tube. . Patients must receive preoperative alvimopan to receive postoperative doses. . Alvimopan is not continued after discharge. . Alvimopan must be ordered by a credentialed prescriber [facility] or [facility] physicians). . Alvimopan will automatically be discontinued after patient has a bowel movement.  Pt did not have an ordered dose of medication prior to procedure. Therefore the order was held per hospital policy.  Royetta Asal, PharmD, BCPS Pager [phone] 09/02/2016 5:30 PM

## 2016-09-02 NOTE — Op Note (Signed)
09/02/2016  2:46 PM  PATIENT:  Destiny [name]  51 y.o. female  Patient Care Team: Worthy Rancher, MD as PCP - General (Family Medicine)  PRE-OPERATIVE DIAGNOSIS:  rectal cancer, diverting ileostomy  POST-OPERATIVE DIAGNOSIS:  rectal cancer, diverting ileostomy  PROCEDURE:   LOOP ILEOSTOMY REVERSAL ANAL EXAM UNDER ANESTHESIA   Surgeon(s): Leighton Ruff, MD  ASSISTANT: none   ANESTHESIA:   local and general  EBL: 248ml  Total I/O In: 1000 [I.V.:1000] Out: 200 [Urine:100; Blood:100]  DRAINS: telfa wick   SPECIMEN:  Source of Specimen:  ileostomy  DISPOSITION OF SPECIMEN:  PATHOLOGY  COUNTS:  YES  PLAN OF CARE: Admit to inpatient   PATIENT DISPOSITION:  PACU - hemodynamically stable.  INDICATION: 51 y.o. F s/p ultra-low anterior resection for rectal cancer   OR FINDINGS: loop ileostomy  DESCRIPTION: the patient was identified in the preoperative holding area and taken to the OR where they were laid supine on the operating room table.  General anesthesia was induced without difficulty. SCDs were also noted to be in place prior to the initiation of anesthesia.  I begin with a digital rectal exam. The anastomosis appeared to be well-healed. I evaluated this with a rigid sigmoidoscope. The distal rectum and anastomosis appeared to be intact and without stricture. There was no sign of chronic inflammation.  The patient was then prepped and draped in the usual sterile fashion.   A surgical timeout was performed indicating the correct patient, procedure, positioning and need for preoperative antibiotics. An incision was made around the loop ileostomy using Bovie electrocautery. Dissection was carried around through the subcutaneous tissue around the ileostomy site also using electrocautery. I was able to enter the fascia through the ileostomy site laterally. I then worked my way around 360 until the entire ileostomy was freed from the fascia. I transected the ostomy and placed a  75 mm GIA blue load stapler to create an anastomosis between the 2 limbs. The common enterotomy channel was then closed using a 60 mm TA blue load stapler. An anti-tension suture was placed using a 3-0 silk suture. The edges of the staple line were imbricated also using 3-0 silk suture. Hemostasis was good within the mesentery. The anastomosis was widely patent. This was placed back into the abdomen and the omentum was brought over it. I then closed the peritoneum with a running 2-0 Vicryl suture. The fascia was closed transversely using interrupted #1 Novafil sutures. The subcutaneous tissue was then reapproximated using 2-0 Vicryl pursestring sutures. The dermal layer was then partially closed as a using a 2-0 Vicryl pursestring. A Telfa wick was placed within the middle to allow for drainage. A field block was placed using Marcaine with epinephrine. A dressing was applied. The patient was awakened from anesthesia and sent to the postanesthesia care unit in stable condition. All counts were correct per operating room staff.

## 2016-09-03 ENCOUNTER — Encounter (HOSPITAL_COMMUNITY): Payer: Self-pay | Admitting: General Surgery

## 2016-09-03 LAB — CBC
HEMATOCRIT: 29.8 % — AB (ref 36.0–46.0)
HEMOGLOBIN: 10.3 g/dL — AB (ref 12.0–15.0)
MCH: 30.7 pg (ref 26.0–34.0)
MCHC: 34.6 g/dL (ref 30.0–36.0)
MCV: 88.7 fL (ref 78.0–100.0)
Platelets: 296 10*3/uL (ref 150–400)
RBC: 3.36 MIL/uL — ABNORMAL LOW (ref 3.87–5.11)
RDW: 14.7 % (ref 11.5–15.5)
WBC: 10.7 10*3/uL — AB (ref 4.0–10.5)

## 2016-09-03 LAB — BASIC METABOLIC PANEL
Anion gap: 10 (ref 5–15)
BUN: 14 mg/dL (ref 6–20)
CO2: 24 mmol/L (ref 22–32)
CREATININE: 0.93 mg/dL (ref 0.44–1.00)
Calcium: 8.4 mg/dL — ABNORMAL LOW (ref 8.9–10.3)
Chloride: 103 mmol/L (ref 101–111)
GFR calc Af Amer: >60 mL/min (ref >60)
GLUCOSE: 166 mg/dL — AB (ref 65–99)
Potassium: 5.1 mmol/L (ref 3.5–5.1)
Sodium: 137 mmol/L (ref 135–145)

## 2016-09-03 LAB — MRSA PCR SCREENING: MRSA by PCR: NEGATIVE

## 2016-09-03 NOTE — Progress Notes (Signed)
   09/03/16 1200  Clinical Encounter Type  Visited With Patient  Visit Type Psychological support;Social support  Stress Factors  Patient Stress Factors Health changes  Counseling intern visited with patient to provide emotional and social support. Counselor has offered support to patient over the past few months as she has received treatment at the [facility]. Today patient appeared alert and oriented x3, and she engaged clearly with counselor throughout the visit. Patient stated that she was initially anxious about her surgery due to not knowing what to expect but was able to calm herself by the time of the procedure. She reports feeling much calmer today, though in a lot of pain. She is still uncertain about the outcome but is hopeful she will be able to return to her normal activities before the ileostomy. Patient and counselor talked about different aspects of her life, especially the support she receives from her family.  Counselor offered to check in with patient again next week if she is still admitted.  Doni Bacha A. Philbert Riser, Counseling Intern Department for [place] and [employer] Supervisor - Chaplain Jerene Pitch

## 2016-09-03 NOTE — Care Management Note (Signed)
Case Management Note  Patient Details  Name: Destiny [name] MRN: [id number] Date of Birth: [birth year]  Subjective/Objective:50 y/o f admitted w/Rectal Ca. POD#1 loop ileostomy reversal. From home.                    Action/Plan:d/c plan home.   Expected Discharge Date:                  Expected Discharge Plan:  Home/Self Care  In-House Referral:     Discharge planning Services  CM Consult  Post Acute Care Choice:    Choice offered to:     DME Arranged:    DME Agency:     HH Arranged:    HH Agency:     Status of Service:  In process, will continue to follow  If discussed at Long Length of Stay Meetings, dates discussed:    Additional Comments:  Dessa Phi, RN 09/03/2016, 1:40 PM

## 2016-09-03 NOTE — Evaluation (Signed)
Physical Therapy Evaluation Patient Details Name: Destiny [name] MRN: [id number] DOB: Mar 06, 1966 Today's Date: 09/03/2016   History of Present Illness  51 y/o female with rectal CA and s/p ileostomy reversal (09/02/16)  Clinical Impression  Pt admitted with above diagnosis. Pt currently with functional limitations due to the deficits listed below (see PT Problem List). Pt will benefit from skilled PT to increase their independence and safety with mobility to allow discharge to the venue listed below.  Pt with most difficulty with bed mobility and needed MAX > MOD A to get to EOB.  Once at EOB, pt moving at min/guard level with ambulation in hallway with RW, using toilet, and brushing teeth at sink.  Will focus on education on bed mobility.  Recommend RW, but do not feel she will need any follow up PT.      Follow Up Recommendations No PT follow up    Equipment Recommendations  Rolling walker with 5" wheels    Recommendations for Other Services       Precautions / Restrictions Precautions Precautions: Other (comment) Precaution Comments: abd incision      Mobility  Bed Mobility Overal bed mobility: Needs Assistance Bed Mobility: Sidelying to Sit;Rolling Rolling: Total assist;Max assist Sidelying to sit: Mod assist       General bed mobility comments: Pt attempted rolling and pain increased to 10/10 and pt began crying and moaning.  Pt rested several minutes and relaxed and prayed.  Performed again with pt holding pillow and PT rolling pt to side with bed pad and then MOD A for sideyling to sit.  Transfers Overall transfer level: Needs assistance Equipment used: Rolling walker (2 wheeled) Transfers: Sit to/from Stand Sit to Stand: Min guard         General transfer comment: stood from bed and toilet with min/guard for safety only  Ambulation/Gait Ambulation/Gait assistance: Min guard Ambulation Distance (Feet): 135 Feet Assistive device: Rolling walker (2 wheeled) Gait  Pattern/deviations: Step-through pattern   Gait velocity interpretation: at or above normal speed for age/gender General Gait Details: Pt ambulated with good cadence and use of RW for stability. Pt rates pain as 4/10.  Cues for breathing. Pt able to walk in room without RW with some furniture cruising.  Stairs            Wheelchair Mobility    Modified Rankin (Stroke Patients Only)       Balance Overall balance assessment: No apparent balance deficits (not formally assessed);Needs assistance   Sitting balance-Leahy Scale: Good     Standing balance support: During functional activity Standing balance-Leahy Scale: Good Standing balance comment: Pt able to wash hands in bathroom and reach out of BOS.  Pt able to brush teeth at sink in room.                             Pertinent Vitals/Pain Pain Assessment: 0-10 Pain Score: 4  Pain Location: abd, up to 10 with bed mobility Pain Descriptors / Indicators: Grimacing;Moaning;Crying Pain Intervention(s): Limited activity within patient's tolerance;RN gave pain meds during session;Utilized relaxation techniques;Repositioned;Monitored during session    Home Living Family/patient expects to be discharged to:: Private residence Living Arrangements: Children Available Help at Discharge: Family;Available PRN/intermittently Type of Home: Mobile home Home Access: Stairs to enter Entrance Stairs-Rails: Can reach both Entrance Stairs-Number of Steps: 4 Home Layout: One level Home Equipment: None      Prior Function Level of Independence: Independent  Hand Dominance        Extremity/Trunk Assessment   Upper Extremity Assessment Upper Extremity Assessment: Overall WFL for tasks assessed    Lower Extremity Assessment Lower Extremity Assessment: Overall WFL for tasks assessed       Communication   Communication: No difficulties  Cognition Arousal/Alertness: Awake/alert Behavior During  Therapy: WFL for tasks assessed/performed Overall Cognitive Status: Within Functional Limits for tasks assessed                                        General Comments General comments (skin integrity, edema, etc.): abd incision covered with bandages    Exercises     Assessment/Plan    PT Assessment Patient needs continued PT services  PT Problem List Decreased activity tolerance;Decreased mobility;Decreased balance;Decreased knowledge of use of DME;Decreased knowledge of precautions;Pain       PT Treatment Interventions DME instruction;Gait training;Functional mobility training;Therapeutic activities;Therapeutic exercise;Stair training    PT Goals (Current goals can be found in the Care Plan section)  Acute Rehab PT Goals Patient Stated Goal: decrease pain PT Goal Formulation: With patient Time For Goal Achievement: 09/10/16 Potential to Achieve Goals: Good    Frequency Min 3X/week   Barriers to discharge        Co-evaluation               End of Session Equipment Utilized During Treatment: Gait belt Activity Tolerance: Patient tolerated treatment well;Patient limited by pain Patient left: in chair;with call bell/phone within reach Nurse Communication: Mobility status PT Visit Diagnosis: Other abnormalities of gait and mobility (R26.89)    Time: 0943 (no charge for time RN was in giving pain meds and taking Foley out.)-1025 PT Time Calculation (min) (ACUTE ONLY): 42 min   Charges:   PT Evaluation $PT Eval Moderate Complexity: 1 Procedure PT Treatments $Therapeutic Activity: 8-22 mins   PT G Codes:        Destiny [name], Destiny [name] Pager [phone] 09/03/2016   Destiny [name] 09/03/2016, 10:46 AM

## 2016-09-03 NOTE — Progress Notes (Signed)
1 Day Post-Op ileostomy reversal Subjective: Pain controlled.  No nausea  Objective: Vital signs in last 24 hours: Temp:  [97.4 F (36.3 C)-98.6 F (37 C)] 97.4 F (36.3 C) (04/05 0553) Pulse Rate:  [64-70] 67 (04/05 0553) Resp:  [12-22] 16 (04/05 0553) BP: (96-176)/(58-95) 96/63 (04/05 0553) SpO2:  [98 %-100 %] 100 % (04/05 0553) Weight:  [95 kg (209 lb 6 oz)] 95 kg (209 lb 6 oz) (04/04 1136)   Intake/Output from previous day: 04/04 0701 - 04/05 0700 In: 2425 [I.V.:2425] Out: 650 [Urine:550; Blood:100] Intake/Output this shift: No intake/output data recorded.   General appearance: alert and cooperative GI: soft, appropriately tender  Incision: no significant drainage  Lab Results:   Recent Labs  09/03/16 0436  WBC 10.7*  HGB 10.3*  HCT 29.8*  PLT 296   BMET  Recent Labs  09/03/16 0436  NA 137  K 5.1  CL 103  CO2 24  GLUCOSE 166*  BUN 14  CREATININE 0.93  CALCIUM 8.4*   PT/INR No results for input(s): LABPROT, INR in the last 72 hours. ABG No results for input(s): PHART, HCO3 in the last 72 hours.  Invalid input(s): PCO2, PO2  MEDS, Scheduled . acetaminophen  1,000 mg Oral Q6H  . enoxaparin (LOVENOX) injection  40 mg Subcutaneous Q24H  . gabapentin  300 mg Oral TID  . levothyroxine  100 mcg Oral QAC breakfast  . LORazepam  0.5 mg Oral QHS  . methocarbamol  500 mg Oral BID  . sertraline  100 mg Oral Daily    Studies/Results: No results found.  Assessment: s/p Procedure(s): ILEOSTOMY REVERSAL ANAL EXAM UNDER ANESTHESIA Patient Active Problem List   Diagnosis Date Noted  . Hypothyroidism 08/24/2016  . Rectal cancer ([place]) 02/26/2016  . Malignant neoplasm of rectum ([place]) 11/17/2015  . Anemia 10/01/2015  . Essential hypertension, benign 09/02/2015  . Obesity 09/02/2015  . Depression 05/02/2015  . Closed fracture of nasal bone 02/18/2015    Expected post op course  Plan: d/c foley  Advance to clears  Ambulate in hall Decrease  MIV Change dressing daily   LOS: 1 day     .Rosario Adie, [facility], [place]   09/03/2016 8:09 AM

## 2016-09-04 LAB — BASIC METABOLIC PANEL
Anion gap: 6 (ref 5–15)
BUN: 13 mg/dL (ref 6–20)
CALCIUM: 8.3 mg/dL — AB (ref 8.9–10.3)
CHLORIDE: 105 mmol/L (ref 101–111)
CO2: 27 mmol/L (ref 22–32)
CREATININE: 0.8 mg/dL (ref 0.44–1.00)
GFR calc non Af Amer: >60 mL/min (ref >60)
Glucose, Bld: 124 mg/dL — ABNORMAL HIGH (ref 65–99)
Potassium: 4 mmol/L (ref 3.5–5.1)
SODIUM: 138 mmol/L (ref 135–145)

## 2016-09-04 LAB — CBC
HCT: 25.3 % — ABNORMAL LOW (ref 36.0–46.0)
Hemoglobin: 8.6 g/dL — ABNORMAL LOW (ref 12.0–15.0)
MCH: 29.9 pg (ref 26.0–34.0)
MCHC: 34 g/dL (ref 30.0–36.0)
MCV: 87.8 fL (ref 78.0–100.0)
PLATELETS: 319 10*3/uL (ref 150–400)
RBC: 2.88 MIL/uL — ABNORMAL LOW (ref 3.87–5.11)
RDW: 14.8 % (ref 11.5–15.5)
WBC: 7.6 10*3/uL (ref 4.0–10.5)

## 2016-09-04 NOTE — Progress Notes (Signed)
2 Days Post-Op ileostomy reversal Subjective: Pain controlled.  No nausea.  Ambulated 3 times.  Tolerating clears  Objective: Vital signs in last 24 hours: Temp:  [98.1 F (36.7 C)-98.6 F (37 C)] 98.1 F (36.7 C) (04/06 0518) Pulse Rate:  [74-89] 89 (04/06 0518) Resp:  [16] 16 (04/06 0518) BP: (105-139)/(52-78) 139/78 (04/06 0518) SpO2:  [97 %-100 %] 98 % (04/06 0518)   Intake/Output from previous day: 04/05 0701 - 04/06 0700 In: 2047.9 [P.O.:720; I.V.:1327.9] Out: 2600 [Urine:2600] Intake/Output this shift: No intake/output data recorded.   General appearance: alert and cooperative GI: soft, appropriately tender  Incision: no significant drainage  Lab Results:   Recent Labs  09/03/16 0436 09/04/16 0522  WBC 10.7* 7.6  HGB 10.3* 8.6*  HCT 29.8* 25.3*  PLT 296 319   BMET  Recent Labs  09/03/16 0436 09/04/16 0522  NA 137 138  K 5.1 4.0  CL 103 105  CO2 24 27  GLUCOSE 166* 124*  BUN 14 13  CREATININE 0.93 0.80  CALCIUM 8.4* 8.3*   PT/INR No results for input(s): LABPROT, INR in the last 72 hours. ABG No results for input(s): PHART, HCO3 in the last 72 hours.  Invalid input(s): PCO2, PO2  MEDS, Scheduled . enoxaparin (LOVENOX) injection  40 mg Subcutaneous Q24H  . gabapentin  300 mg Oral TID  . levothyroxine  100 mcg Oral QAC breakfast  . LORazepam  0.5 mg Oral QHS  . methocarbamol  500 mg Oral BID  . sertraline  100 mg Oral Daily    Studies/Results: No results found.  Assessment: s/p Procedure(s): ILEOSTOMY REVERSAL ANAL EXAM UNDER ANESTHESIA Patient Active Problem List   Diagnosis Date Noted  . Hypothyroidism 08/24/2016  . Rectal cancer ([place]) 02/26/2016  . Malignant neoplasm of rectum ([place]) 11/17/2015  . Anemia 10/01/2015  . Essential hypertension, benign 09/02/2015  . Obesity 09/02/2015  . Depression 05/02/2015  . Closed fracture of nasal bone 02/18/2015    Expected post op course  Plan: Advance to FLD Ambulate in  hall Decrease MIV Change dressing daily   LOS: 2 days     .Rosario Adie, [facility], [place]   09/04/2016 7:19 AM

## 2016-09-04 NOTE — Anesthesia Postprocedure Evaluation (Signed)
Anesthesia Post Note  Patient: Destiny [name]  Procedure(s) Performed: Procedure(s) (LRB): ILEOSTOMY REVERSAL (N/A) ANAL EXAM UNDER ANESTHESIA (N/A)  Patient location during evaluation: PACU Anesthesia Type: General Level of consciousness: awake and alert Pain management: pain level controlled Vital Signs Assessment: post-procedure vital signs reviewed and stable Respiratory status: spontaneous breathing, nonlabored ventilation, respiratory function stable and patient connected to nasal cannula oxygen Cardiovascular status: blood pressure returned to baseline and stable Postop Assessment: no signs of nausea or vomiting Anesthetic complications: no       Last Vitals:  Vitals:   09/03/16 2119 09/04/16 0518  BP: (!) 112/53 139/78  Pulse: 78 89  Resp: 16 16  Temp: 36.8 C 36.7 C    Last Pain:  Vitals:   09/04/16 1124  TempSrc:   PainSc: 0-No pain                 Lucus Lambertson EDWARD

## 2016-09-05 LAB — BASIC METABOLIC PANEL
Anion gap: 8 (ref 5–15)
BUN: 8 mg/dL (ref 6–20)
CALCIUM: 8.7 mg/dL — AB (ref 8.9–10.3)
CO2: 31 mmol/L (ref 22–32)
Chloride: 98 mmol/L — ABNORMAL LOW (ref 101–111)
Creatinine, Ser: 0.76 mg/dL (ref 0.44–1.00)
GFR calc Af Amer: >60 mL/min (ref >60)
GFR calc non Af Amer: >60 mL/min (ref >60)
GLUCOSE: 140 mg/dL — AB (ref 65–99)
POTASSIUM: 4.2 mmol/L (ref 3.5–5.1)
Sodium: 137 mmol/L (ref 135–145)

## 2016-09-05 LAB — CBC
HEMATOCRIT: 26.5 % — AB (ref 36.0–46.0)
Hemoglobin: 9 g/dL — ABNORMAL LOW (ref 12.0–15.0)
MCH: 30.8 pg (ref 26.0–34.0)
MCHC: 34 g/dL (ref 30.0–36.0)
MCV: 90.8 fL (ref 78.0–100.0)
Platelets: 297 10*3/uL (ref 150–400)
RBC: 2.92 MIL/uL — ABNORMAL LOW (ref 3.87–5.11)
RDW: 14.7 % (ref 11.5–15.5)
WBC: 8.5 10*3/uL (ref 4.0–10.5)

## 2016-09-05 MED ORDER — PROMETHAZINE HCL 25 MG/ML IJ SOLN
25.0000 mg | Freq: Four times a day (QID) | INTRAMUSCULAR | Status: DC | PRN
  Administered 2016-09-05 – 2016-09-07 (×2): 25 mg via INTRAVENOUS
  Filled 2016-09-05 (×2): qty 1

## 2016-09-05 NOTE — Progress Notes (Signed)
[facility] Office:  [phone] General Surgery Progress Note   LOS: 3 days  POD -  3 Days Post-Op  Chief Complaint: Nauseated - vomited about every 6 hours, and stool running out of rectum - no control  Assessment and Plan: 1.  ILEOSTOMY REVERSAL, ANAL EXAM UNDER ANESTHESIA - 09/03/2016 Marcello Moores  LAR for rectal cancer - Sept 2017  A little puny today - nausea and no control of stools  Stay on same diet - will increase IVF's 2.  DVT prophylaxis - Lovenox 3.  HTN 4.  History of depression   Active Problems:   Rectal cancer ([place])  Subjective:  Nauseated - vomited about every 6 hours, and stool running out of rectum - no control.  Does not like full liquid diet, but not sure she wants to advance.  Boyfriend sleeping in room.  Objective:   Vitals:   09/04/16 2139 09/05/16 0605  BP: 133/80 120/65  Pulse: 88 83  Resp: 16 15  Temp: 98.9 F (37.2 C) 98.5 F (36.9 C)     Intake/Output from previous day:  04/06 0701 - 04/07 0700 In: 1184.7 [P.O.:840; I.V.:344.7] Out: 1800 [Urine:1800]  Intake/Output this shift:  No intake/output data recorded.   Physical Exam:   General: Older WF who is alert and oriented.    HEENT: Normal. Pupils equal. .   Lungs: Clear   Abdomen: Quiet.   Wound: RLQ wound with wick.   Lab Results:    Recent Labs  09/04/16 0522 09/05/16 0455  WBC 7.6 8.5  HGB 8.6* 9.0*  HCT 25.3* 26.5*  PLT 319 297    BMET   Recent Labs  09/04/16 0522 09/05/16 0455  NA 138 137  K 4.0 4.2  CL 105 98*  CO2 27 31  GLUCOSE 124* 140*  BUN 13 8  CREATININE 0.80 0.76  CALCIUM 8.3* 8.7*    PT/INR  No results for input(s): LABPROT, INR in the last 72 hours.  ABG  No results for input(s): PHART, HCO3 in the last 72 hours.  Invalid input(s): PCO2, PO2   Studies/Results:  No results found.   Anti-infectives:   Anti-infectives    Start     Dose/Rate Route Frequency Ordered Stop   09/02/16 1132  cefoTEtan in Dextrose 5% (CEFOTAN) IVPB  2 g     2 g Intravenous On call to O.R. 09/02/16 1132 09/02/16 1326      Alphonsa Overall, MD, FACS Pager: [facility] Office: [phone] 09/05/2016

## 2016-09-06 LAB — CBC WITH DIFFERENTIAL/PLATELET
BASOS ABS: 0 10*3/uL (ref 0.0–0.1)
Basophils Relative: 0 %
EOS PCT: 1 %
Eosinophils Absolute: 0.1 10*3/uL (ref 0.0–0.7)
HCT: 29.4 % — ABNORMAL LOW (ref 36.0–46.0)
HEMOGLOBIN: 9.6 g/dL — AB (ref 12.0–15.0)
LYMPHS ABS: 0.6 10*3/uL — AB (ref 0.7–4.0)
LYMPHS PCT: 6 %
MCH: 29 pg (ref 26.0–34.0)
MCHC: 32.7 g/dL (ref 30.0–36.0)
MCV: 88.8 fL (ref 78.0–100.0)
Monocytes Absolute: 0.8 10*3/uL (ref 0.1–1.0)
Monocytes Relative: 8 %
NEUTROS ABS: 8.8 10*3/uL — AB (ref 1.7–7.7)
NEUTROS PCT: 85 %
PLATELETS: 341 10*3/uL (ref 150–400)
RBC: 3.31 MIL/uL — AB (ref 3.87–5.11)
RDW: 14.5 % (ref 11.5–15.5)
WBC: 10.4 10*3/uL (ref 4.0–10.5)

## 2016-09-06 LAB — BASIC METABOLIC PANEL
ANION GAP: 7 (ref 5–15)
BUN: 8 mg/dL (ref 6–20)
CO2: 32 mmol/L (ref 22–32)
Calcium: 8.6 mg/dL — ABNORMAL LOW (ref 8.9–10.3)
Chloride: 97 mmol/L — ABNORMAL LOW (ref 101–111)
Creatinine, Ser: 0.86 mg/dL (ref 0.44–1.00)
GFR calc Af Amer: >60 mL/min (ref >60)
GLUCOSE: 152 mg/dL — AB (ref 65–99)
POTASSIUM: 4.3 mmol/L (ref 3.5–5.1)
Sodium: 136 mmol/L (ref 135–145)

## 2016-09-06 MED ORDER — HYDROMORPHONE HCL 2 MG/ML IJ SOLN
0.5000 mg | INTRAMUSCULAR | Status: DC | PRN
  Administered 2016-09-07 – 2016-09-09 (×9): 0.5 mg via INTRAVENOUS
  Filled 2016-09-06 (×9): qty 1

## 2016-09-06 NOTE — Progress Notes (Signed)
[facility] Office:  [phone] General Surgery Progress Note   LOS: 4 days  POD -  4 Days Post-Op  Chief Complaint: Still nauseated - maybe a little better this AM.  Has not ambulated because of stool leaking.  Assessment and Plan: 1.  ILEOSTOMY REVERSAL, ANAL EXAM UNDER ANESTHESIA - 09/03/2016 Marcello Moores  LAR for rectal cancer - Sept 2017  WBC up a little - 10,400 - 09/06/2016  Nauseated when she got up last night - a little better this AM.  I am hesitant to advance diet until nausea is better.  She also says that she dribbles stool when she walks - I told her to walk anyway.  2.  DVT prophylaxis - Lovenox 3.  HTN 4.  History of depression 5.  Hgb - 9.6 - 09/06/2016   Active Problems:   Rectal cancer ([facility])  Subjective:  Nauseated - but may be a bit better.  Not ready to advance diet.  Boyfriend sleeping sitting up in room.  Objective:   Vitals:   09/05/16 2101 09/06/16 0519  BP: 119/72 108/80  Pulse: 91 81  Resp: 16 15  Temp: 98.3 F (36.8 C) 98.5 F (36.9 C)     Intake/Output from previous day:  04/07 0701 - 04/08 0700 In: 2005.5 [P.O.:420; I.V.:1585.5] Out: 500 [Urine:500]  Intake/Output this shift:  No intake/output data recorded.   Physical Exam:   General: Older WF who is alert and oriented.    HEENT: Normal. Pupils equal. .   Lungs: Clear.  Incentive spirometry - 1,000 cc   Abdomen: Rare BS.  Soft   Wound: RLQ wound with wick.   Lab Results:     Recent Labs  09/05/16 0455 09/06/16 0447  WBC 8.5 10.4  HGB 9.0* 9.6*  HCT 26.5* 29.4*  PLT 297 341    BMET    Recent Labs  09/05/16 0455 09/06/16 0447  NA 137 136  K 4.2 4.3  CL 98* 97*  CO2 31 32  GLUCOSE 140* 152*  BUN 8 8  CREATININE 0.76 0.86  CALCIUM 8.7* 8.6*    PT/INR  No results for input(s): LABPROT, INR in the last 72 hours.  ABG  No results for input(s): PHART, HCO3 in the last 72 hours.  Invalid input(s): PCO2, PO2   Studies/Results:  No results  found.   Anti-infectives:   Anti-infectives    Start     Dose/Rate Route Frequency Ordered Stop   09/02/16 1132  cefoTEtan in Dextrose 5% (CEFOTAN) IVPB 2 g     2 g Intravenous On call to O.R. 09/02/16 1132 09/02/16 1326      Alphonsa Overall, MD, FACS Pager: [facility] Office: [phone] 09/06/2016

## 2016-09-07 LAB — BASIC METABOLIC PANEL
Anion gap: 9 (ref 5–15)
BUN: 7 mg/dL (ref 6–20)
CALCIUM: 8.6 mg/dL — AB (ref 8.9–10.3)
CO2: 30 mmol/L (ref 22–32)
CREATININE: 0.86 mg/dL (ref 0.44–1.00)
Chloride: 93 mmol/L — ABNORMAL LOW (ref 101–111)
GFR calc non Af Amer: >60 mL/min (ref >60)
Glucose, Bld: 163 mg/dL — ABNORMAL HIGH (ref 65–99)
Potassium: 3.4 mmol/L — ABNORMAL LOW (ref 3.5–5.1)
SODIUM: 132 mmol/L — AB (ref 135–145)

## 2016-09-07 LAB — CBC WITH DIFFERENTIAL/PLATELET
BASOS PCT: 0 %
Basophils Absolute: 0 10*3/uL (ref 0.0–0.1)
EOS ABS: 0.3 10*3/uL (ref 0.0–0.7)
EOS PCT: 3 %
HCT: 30.3 % — ABNORMAL LOW (ref 36.0–46.0)
Hemoglobin: 10.2 g/dL — ABNORMAL LOW (ref 12.0–15.0)
Lymphocytes Relative: 8 %
Lymphs Abs: 0.9 10*3/uL (ref 0.7–4.0)
MCH: 30.7 pg (ref 26.0–34.0)
MCHC: 33.7 g/dL (ref 30.0–36.0)
MCV: 91.3 fL (ref 78.0–100.0)
MONO ABS: 0.8 10*3/uL (ref 0.1–1.0)
MONOS PCT: 8 %
NEUTROS PCT: 81 %
Neutro Abs: 8.7 10*3/uL — ABNORMAL HIGH (ref 1.7–7.7)
PLATELETS: 380 10*3/uL (ref 150–400)
RBC: 3.32 MIL/uL — ABNORMAL LOW (ref 3.87–5.11)
RDW: 14.9 % (ref 11.5–15.5)
WBC: 10.6 10*3/uL — ABNORMAL HIGH (ref 4.0–10.5)

## 2016-09-07 NOTE — Progress Notes (Signed)
Patient  Is bleeding from incision site on RLQ second pressure bandage applied and saturated.Large amounts of blood were on the floor and bed. EVS was also notified to come clean floor. MD notified and advised me to hold pressure for 10 full minutes until bleeding stops.

## 2016-09-07 NOTE — Progress Notes (Signed)
   09/07/16 1100  Clinical Encounter Type  Visited With Patient  Visit Type Psychological support  Stress Factors  Patient Stress Factors Health changes  Counseling intern visited with patient to offer emotional support. Client was alert and oriented x3. She engaged clearly with counselor throughout visit, though she reported feeling a great deal of abdominal discomfort and was not up to talking a lot. Patient reported that many of her family members have visited over the weekend and that she felt supported. Patient noted that she continues to feel optimistic about the results of the surgery. Counselor will check in again later in the week as schedules permit.  Normajean Nash A. Philbert Riser, Counseling Intern Department for [facility] Supervisor - [place], [place]

## 2016-09-07 NOTE — Progress Notes (Signed)
PHYSICAL THERAPY  Pt c/o a lot ABD pain this morning and currently sleeping.  Will check back again as schedule permits.  Staff reports, pt is amb and plans to return home.  Rica Koyanagi  PTA [facility]  Acute  Rehab Pager      [phone]

## 2016-09-07 NOTE — Progress Notes (Signed)
[facility] Office:  [phone] General Surgery Progress Note   LOS: 5 days  POD -  5 Days Post-Op  Chief Complaint: Still nauseated when getting up.  Ambulated around the hallway yesterday  Assessment and Plan: 1.  ILEOSTOMY REVERSAL, ANAL EXAM UNDER ANESTHESIA - 09/03/2016 Marcello Moores  LAR for rectal cancer - Sept 2017  WBC up a little - but stable  Nauseated when she got up last night - a little better this AM.  I will have her try a soft diet.  This is similar to her recovery from her last surgery.  2.  DVT prophylaxis - Lovenox 3.  HTN 4.  History of depression 5.  Hgb - 9.6 stable   Active Problems:   Rectal cancer ([facility])  Subjective:  Nauseated - but may be a bit better.  Ready to advance diet.   Objective:   Vitals:   09/06/16 2215 09/07/16 0551  BP: 129/80 122/70  Pulse: 82 83  Resp: 18 18  Temp: 99.1 F (37.3 C) 98.9 F (37.2 C)     Intake/Output from previous day:  04/08 0701 - 04/09 0700 In: 2400 [P.O.:600; I.V.:1800] Out: 300 [Urine:300]  Intake/Output this shift:  No intake/output data recorded.   Physical Exam:   General: Older WF who is alert and oriented.    HEENT: Normal. Pupils equal.   Abdomen: Soft   Wound: RLQ wound, wick removed.   Lab Results:     Recent Labs  09/06/16 0447 09/07/16 0455  WBC 10.4 10.6*  HGB 9.6* 10.2*  HCT 29.4* 30.3*  PLT 341 380    BMET    Recent Labs  09/06/16 0447 09/07/16 0455  NA 136 132*  K 4.3 3.4*  CL 97* 93*  CO2 32 30  GLUCOSE 152* 163*  BUN 8 7  CREATININE 0.86 0.86  CALCIUM 8.6* 8.6*    PT/INR  No results for input(s): LABPROT, INR in the last 72 hours.  ABG  No results for input(s): PHART, HCO3 in the last 72 hours.  Invalid input(s): PCO2, PO2   Studies/Results:  No results found.   Anti-infectives:   Anti-infectives    Start     Dose/Rate Route Frequency Ordered Stop   09/02/16 1132  cefoTEtan in Dextrose 5% (CEFOTAN) IVPB 2 g     2 g Intravenous On call to  O.R. 09/02/16 1132 62/[id number]/48 [id number]      Ajai Terhaar C Shealynn Saulnier, MD  [facility] and [facility]

## 2016-09-08 MED ORDER — LIP MEDEX EX OINT
TOPICAL_OINTMENT | CUTANEOUS | Status: AC
  Filled 2016-09-08: qty 7

## 2016-09-08 MED ORDER — SCOPOLAMINE 1 MG/3DAYS TD PT72
1.0000 | MEDICATED_PATCH | TRANSDERMAL | Status: DC
  Administered 2016-09-08: 1.5 mg via TRANSDERMAL
  Filled 2016-09-08: qty 1

## 2016-09-08 NOTE — Progress Notes (Signed)
qPhysical Therapy Treatment Patient Details Name: Destiny [name] MRN: [id number] DOB: June 29, 1965 Today's Date: 09/08/2016    History of Present Illness 51 y/o female with rectal CA and s/p ileostomy reversal (09/02/16)    PT Comments    Pt OOB in recliner with room completely dark.  Pt c/o freq loose BM's and embarrassed.  Assisted to [facility] which was placed close by.  Pt having freq small episodes of watery, yellowish stools.  Assisted to bathroom static standing at sink x 10 min to wash pt's hair then assisted to toilet for more loose stools.  Assisted with hygiene and barrier cream.  Assisted with amb in hallway then back to room in recliner.                                                                                                   Follow Up Recommendations  No PT follow up     Equipment Recommendations  Rolling walker with 5" wheels    Recommendations for Other Services       Precautions / Restrictions Precautions Precautions: Fall Precaution Comments: abd incision Restrictions Weight Bearing Restrictions: No    Mobility  Bed Mobility               General bed mobility comments: OOB in recliner  Transfers Overall transfer level: Needs assistance Equipment used: Rolling walker (2 wheeled);None Transfers: Sit to/from [employer] to Stand: Supervision;Min guard Stand pivot transfers: Supervision;Min guard       General transfer comment: 25% VC's on environmental safety (BSC/IV line) and turn completion    Ambulation/Gait Ambulation/Gait assistance: Supervision;Min guard Ambulation Distance (Feet): 140 Feet Assistive device: Rolling walker (2 wheeled) Gait Pattern/deviations: Step-through pattern;Trunk flexed Gait velocity: decreased    General Gait Details: slow but steady.  Slight forward flex due to ABD "cramping".     Stairs            Wheelchair Mobility    Modified Rankin (Stroke Patients Only)       Balance                                             Cognition Arousal/Alertness: Awake/alert Behavior During Therapy: Flat affect Overall Cognitive Status: Impaired/Different from baseline                                 General Comments: appears tired, "depressed"    room is dark      Exercises      General Comments        Pertinent Vitals/Pain Pain Assessment: Faces Faces Pain Scale: Hurts a little bit Pain Location: "stomach" Pain Descriptors / Indicators: Operative site guarding;Cramping;Discomfort Pain Intervention(s): Monitored during session    Home Living                      Prior Function  PT Goals (current goals can now be found in the care plan section) Progress towards PT goals: Progressing toward goals    Frequency    Min 3X/week      PT Plan Current plan remains appropriate    Co-evaluation             End of Session Equipment Utilized During Treatment: Gait belt Activity Tolerance: Patient limited by fatigue Patient left: in chair;with call bell/phone within reach Nurse Communication: Mobility status PT Visit Diagnosis: Other abnormalities of gait and mobility (R26.89)     Time: 8891-6945 PT Time Calculation (min) (ACUTE ONLY): 42 min  Charges:  $Gait Training: 8-22 mins $Therapeutic Activity: 23-37 mins                    G Codes:       Rica Koyanagi  PTA [facility]  Acute  Rehab Pager      [phone]

## 2016-09-08 NOTE — Progress Notes (Signed)
[facility]:  [phone] General Surgery Progress Note   LOS: 6 days  POD -  6 Days Post-Op  Chief Complaint: Still nauseated when getting up but better.  Ambulated around the hallway yesterday.  Had some bleeding from incision after emesis yesterday.  Resolved with direct pressure  Assessment and Plan: 1.  ILEOSTOMY REVERSAL, ANAL EXAM UNDER ANESTHESIA - 09/03/2016 Destiny [name]  LAR for rectal cancer - Sept 2017  Nauseated when she got up last night - a little better this AM.  Cont soft diet as tolerated.  This is similar to her recovery from her last surgery.  Will try scop patch today  2.  DVT prophylaxis - Lovenox 3.  HTN 4.  History of depression 5.  Hgb - stable   Active Problems:   Rectal cancer (HCC)  Subjective:  Nausea a bit better.     Objective:   Vitals:   09/07/16 2251 09/08/16 0630  BP: 118/61 (!) 98/54  Pulse: 72 73  Resp: 18 18  Temp: 98.3 F (36.8 C) 98.3 F (36.8 C)     Intake/Output from previous day:  04/09 0701 - 04/10 0700 In: 2509.5 [P.O.:1082; I.V.:1427.5] Out: 169 [Urine:160; Emesis/NG output:5; Blood:4]  Intake/Output this shift:  No intake/output data recorded.   Physical Exam:   General: Older WF who is alert and oriented.    HEENT: Normal. Pupils equal.   Abdomen: Soft   Wound: RLQ wound, dressing intact   Lab Results:     Recent Labs  09/06/16 0447 09/07/16 0455  WBC 10.4 10.6*  HGB 9.6* 10.2*  HCT 29.4* 30.3*  PLT 341 380    BMET    Recent Labs  09/06/16 0447 09/07/16 0455  NA 136 132*  K 4.3 3.4*  CL 97* 93*  CO2 32 30  GLUCOSE 152* 163*  BUN 8 7  CREATININE 0.86 0.86  CALCIUM 8.6* 8.6*    PT/INR  No results for input(s): LABPROT, INR in the last 72 hours.  ABG  No results for input(s): PHART, HCO3 in the last 72 hours.  Invalid input(s): PCO2, PO2   Studies/Results:  No results found.   Anti-infectives:   Anti-infectives    Start     Dose/Rate Route Frequency Ordered Stop   09/02/16 1132  cefoTEtan in Dextrose 5% (CEFOTAN) IVPB 2 g     2 g Intravenous On call to O.R. 09/02/16 1132 28/76/81 1572      Jazariah Teall C Leata Dominy, MD  [facility]

## 2016-09-09 MED ORDER — MENTHOL 3 MG MT LOZG: 1.0000 | LOZENGE | OROMUCOSAL | Status: DC | PRN

## 2016-09-09 MED ORDER — SODIUM CHLORIDE 0.9% FLUSH: 3.0000 mL | INTRAVENOUS | Status: DC | PRN

## 2016-09-09 MED ORDER — LIP MEDEX EX OINT
1.0000 "application " | TOPICAL_OINTMENT | Freq: Two times a day (BID) | CUTANEOUS | Status: DC
  Administered 2016-09-09: 1 via TOPICAL
  Filled 2016-09-09: qty 7

## 2016-09-09 MED ORDER — VITAMIN C 500 MG PO TABS: 500.0000 mg | ORAL_TABLET | Freq: Every day | ORAL | Status: DC

## 2016-09-09 MED ORDER — ACETAMINOPHEN 500 MG PO TABS: 1000.0000 mg | ORAL_TABLET | Freq: Three times a day (TID) | ORAL | Status: DC | PRN

## 2016-09-09 MED ORDER — PSYLLIUM 95 % PO PACK
1.0000 | PACK | Freq: Every day | ORAL | Status: DC
  Administered 2016-09-09: 1 via ORAL
  Filled 2016-09-09: qty 1

## 2016-09-09 MED ORDER — LACTATED RINGERS IV BOLUS (SEPSIS): 1000.0000 mL | Freq: Three times a day (TID) | INTRAVENOUS | Status: DC | PRN

## 2016-09-09 MED ORDER — OXYCODONE HCL 5 MG PO TABS
5.0000 mg | ORAL_TABLET | ORAL | Status: DC | PRN
  Administered 2016-09-10: 5 mg via ORAL
  Filled 2016-09-09: qty 1

## 2016-09-09 MED ORDER — MAGIC MOUTHWASH
15.0000 mL | Freq: Four times a day (QID) | ORAL | Status: DC | PRN
  Filled 2016-09-09: qty 15

## 2016-09-09 MED ORDER — PHENOL 1.4 % MT LIQD: 2.0000 | OROMUCOSAL | Status: DC | PRN

## 2016-09-09 MED ORDER — SODIUM CHLORIDE 0.9% FLUSH
3.0000 mL | Freq: Two times a day (BID) | INTRAVENOUS | Status: DC
  Administered 2016-09-09: 3 mL via INTRAVENOUS

## 2016-09-09 MED ORDER — SODIUM CHLORIDE 0.9 % IV SOLN: 250.0000 mL | INTRAVENOUS | Status: DC | PRN

## 2016-09-09 MED ORDER — BISMUTH SUBSALICYLATE 262 MG/15ML PO SUSP
30.0000 mL | Freq: Three times a day (TID) | ORAL | Status: DC | PRN
  Filled 2016-09-09: qty 118

## 2016-09-09 MED ORDER — FERROUS SULFATE 325 (65 FE) MG PO TABS
325.0000 mg | ORAL_TABLET | Freq: Every day | ORAL | Status: DC
  Administered 2016-09-10: 325 mg via ORAL
  Filled 2016-09-09: qty 1

## 2016-09-09 MED ORDER — SACCHAROMYCES BOULARDII 250 MG PO CAPS: 250.0000 mg | ORAL_CAPSULE | Freq: Two times a day (BID) | ORAL | Status: DC

## 2016-09-09 MED ORDER — OXYCODONE HCL 5 MG PO TABS: 5.0000 mg | ORAL_TABLET | Freq: Two times a day (BID) | ORAL | Status: DC | PRN

## 2016-09-09 MED ORDER — ENSURE ENLIVE PO LIQD
237.0000 mL | Freq: Two times a day (BID) | ORAL | Status: DC
  Administered 2016-09-09: 237 mL via ORAL

## 2016-09-09 NOTE — Progress Notes (Signed)
[facility]:  [phone] General Surgery Progress Note   LOS: 7 days  POD -  7 Days Post-Op  Chief Complaint: Nausea better.  Tolerating a diet  Assessment and Plan: 1.  ILEOSTOMY REVERSAL, ANAL EXAM UNDER ANESTHESIA - 09/03/2016 Marcello Moores  LAR for rectal cancer - Sept 2017  Nauseated when she got up last night - a little better this AM.  Cont soft diet as tolerated.  This is similar to her recovery from her last surgery.   Anticipate d/c in AM 2.  DVT prophylaxis - Lovenox 3.  HTN 4.  History of depression 5.  Hgb - stable   Active Problems:   Rectal cancer (HCC)  Subjective:  Nausea better.     Objective:   Vitals:   09/09/16 0622 09/09/16 1400  BP: (!) 91/51 111/65  Pulse: 72 72  Resp: 16 18  Temp: 98.3 F (36.8 C) 98.4 F (36.9 C)     Intake/Output from previous day:  04/10 0701 - 04/11 0700 In: 1140 [P.O.:240; I.V.:900] Out: -   Intake/Output this shift:  No intake/output data recorded.   Physical Exam:   General: Older WF who is alert and oriented.    HEENT: Normal. Pupils equal.   Abdomen: Soft   Wound: RLQ wound, dressing intact   Lab Results:     Recent Labs  09/07/16 0455  WBC 10.6*  HGB 10.2*  HCT 30.3*  PLT 380    BMET    Recent Labs  09/07/16 0455  NA 132*  K 3.4*  CL 93*  CO2 30  GLUCOSE 163*  BUN 7  CREATININE 0.86  CALCIUM 8.6*    PT/INR  No results for input(s): LABPROT, INR in the last 72 hours.  ABG  No results for input(s): PHART, HCO3 in the last 72 hours.  Invalid input(s): PCO2, PO2   Studies/Results:  No results found.   Anti-infectives:   Anti-infectives    Start     Dose/Rate Route Frequency Ordered Stop   09/02/16 1132  cefoTEtan in Dextrose 5% (CEFOTAN) IVPB 2 g     2 g Intravenous On call to O.R. 09/02/16 1132 95/32/02 3343      Nicholad Kautzman C Gabrielly Mccrystal, MD  [facility]

## 2016-09-09 NOTE — Progress Notes (Signed)
Physical Therapy Treatment Patient Details Name: Destiny [name] MRN: [id number] DOB: [birth year] Today's Date: 09/09/2016    History of Present Illness 51 y/o female with rectal CA and s/p ileostomy reversal (09/02/16)    PT Comments    Assisted OOB to amb a greater distance in hallway.  Assisted to bathroom.  Pt cont to have loose, watery, yellowish, bloating stools.  Assisted with hygiene then back to bed side lying.    Follow Up Recommendations  No PT follow up     Equipment Recommendations  Rolling walker with 5" wheels    Recommendations for Other Services       Precautions / Restrictions Precautions Precautions: Fall Precaution Comments: abd incision Restrictions Weight Bearing Restrictions: No    Mobility  Bed Mobility Overal bed mobility: Needs Assistance Bed Mobility: Supine to Sit;Sit to Supine   Sidelying to sit: Supervision Supine to sit: Supervision     General bed mobility comments: assist with blankets, pillow and IV line only.  Required increased time back to bed to position to comfort sidelying.    Transfers Overall transfer level: Needs assistance Equipment used: None;Rolling walker (2 wheeled) Transfers: Sit to/from [facility] Sit to Stand: Supervision Stand pivot transfers: Supervision       General transfer comment: 25% VC's on environmental safety (BSC/IV line) and turn completion    Ambulation/Gait Ambulation/Gait assistance: Supervision;Min guard Ambulation Distance (Feet): 280 Feet Assistive device: Rolling walker (2 wheeled) Gait Pattern/deviations: Step-through pattern Gait velocity: decreased    General Gait Details: slow but steady.  Slight forward flex due to ABD "cramping".  Used walker in hallway for safety but in room pt is able without with occassional holding to doorway, bed.    Stairs            Wheelchair Mobility    Modified Rankin (Stroke Patients Only)       Balance                                            Cognition Arousal/Alertness: Awake/alert Behavior During Therapy: WFL for tasks assessed/performed Overall Cognitive Status: Within Functional Limits for tasks assessed                                        Exercises      General Comments        Pertinent Vitals/Pain Pain Assessment: Faces Faces Pain Scale: Hurts little more Pain Location: "stomach" Pain Descriptors / Indicators: Aching;Cramping Pain Intervention(s): Monitored during session    Home Living                      Prior Function            PT Goals (current goals can now be found in the care plan section) Progress towards PT goals: Progressing toward goals    Frequency    Min 3X/week      PT Plan Current plan remains appropriate    Co-evaluation             End of Session Equipment Utilized During Treatment: Gait belt Activity Tolerance: Patient tolerated treatment well Patient left: in bed;with bed alarm set;with call bell/phone within reach Nurse Communication: Mobility status PT Visit Diagnosis: Other abnormalities of gait and mobility (  R26.89)     Time: 4320-0379 PT Time Calculation (min) (ACUTE ONLY): 29 min  Charges:  $Gait Training: 8-22 mins $Therapeutic Activity: 8-22 mins                    G Codes:       Rica Koyanagi  PTA [facility]  Acute  Rehab Pager      [phone]

## 2016-09-10 MED ORDER — OXYCODONE HCL 5 MG PO TABS: 5.0000 mg | ORAL_TABLET | ORAL | 0 refills | Status: DC | PRN

## 2016-09-10 MED ORDER — PSYLLIUM 95 % PO PACK
1.0000 | PACK | Freq: Every day | ORAL | Status: DC
Start: 2016-09-10 — End: 2016-10-20

## 2016-09-10 MED ORDER — LOPERAMIDE HCL 2 MG PO CAPS
2.0000 mg | ORAL_CAPSULE | ORAL | Status: DC | PRN
  Administered 2016-09-10: 2 mg via ORAL
  Filled 2016-09-10: qty 1

## 2016-09-10 NOTE — Discharge Instructions (Signed)
ABDOMINAL SURGERY: POST OP INSTRUCTIONS ° °1. DIET: Follow a light bland diet the first 24 hours after arrival home, such as soup, liquids, crackers, etc.  Be sure to include lots of fluids daily.  Avoid fast food or heavy meals as your are more likely to get nauseated.  Do not eat any uncooked fruits or vegetables for the next 2 weeks as your colon heals. °2. Take your usually prescribed home medications unless otherwise directed. °3. PAIN CONTROL: °a. Pain is best controlled by a usual combination of three different methods TOGETHER: °i. Ice/Heat °ii. Over the counter pain medication °iii. Prescription pain medication °b. Most patients will experience some swelling and bruising around the incisions.  Ice packs or heating pads (30-60 minutes up to 6 times a day) will help. Use ice for the first few days to help decrease swelling and bruising, then switch to heat to help relax tight/sore spots and speed recovery.  Some people prefer to use ice alone, heat alone, alternating between ice & heat.  Experiment to what works for you.  Swelling and bruising can take several weeks to resolve.   °c. It is helpful to take an over-the-counter pain medication regularly for the first few weeks.  Choose one of the following that works best for you: °i. Naproxen (Aleve, etc)  Two 220mg tabs twice a day °ii. Ibuprofen (Advil, etc) Three 200mg tabs four times a day (every meal & bedtime) °iii. Acetaminophen (Tylenol, etc) 500-650mg four times a day (every meal & bedtime) °d. A  prescription for pain medication (such as oxycodone, hydrocodone, etc) should be given to you upon discharge.  Take your pain medication as prescribed.  °i. If you are having problems/concerns with the prescription medicine (does not control pain, nausea, vomiting, rash, itching, etc), please call us [phone] to see if we need to switch you to a different pain medicine that will work better for you and/or control your side effect better. °ii. If you  need a refill on your pain medication, please contact your pharmacy.  They will contact our office to request authorization. Prescriptions will not be filled after 5 pm or on week-ends. °4. Avoid getting constipated.  Between the surgery and the pain medications, it is common to experience some constipation.  Increasing fluid intake and taking a fiber supplement (such as Metamucil, Citrucel, FiberCon, MiraLax, etc) 1-2 times a day regularly will usually help prevent this problem from occurring.  A mild laxative (prune juice, Milk of Magnesia, MiraLax, etc) should be taken according to package directions if there are no bowel movements after 48 hours.   °5. Watch out for diarrhea.  If you have many loose bowel movements, simplify your diet to bland foods & liquids for a few days.  Stop any stool softeners and decrease your fiber supplement.  Switching to mild anti-diarrheal medications (Kayopectate, Pepto Bismol) can help.  If this worsens or does not improve, please call us. °6. Wash / shower every day.  You may shower over the incision / wound.  Avoid baths until the skin is fully healed.  Continue to shower over incision(s) after the dressing is off. °7. Change your dressing daily °8. ACTIVITIES as tolerated:   °a. You may resume regular (light) daily activities beginning the next day--such as daily self-care, walking, climbing stairs--gradually increasing activities as tolerated.  If you can walk 30 minutes without difficulty, it is safe to try more intense activity such as jogging, treadmill, bicycling, low-impact aerobics, swimming, etc. °b.   Save the most intensive and strenuous activity for last such as sit-ups, heavy lifting, contact sports, etc  Refrain from any heavy lifting or straining until you are off narcotics for pain control.   °c. DO NOT PUSH THROUGH PAIN.  Let pain be your guide: If it hurts to do something, don't do it.  Pain is your body warning you to avoid that activity for another week until  the pain goes down. °d. You may drive when you are no longer taking prescription pain medication, you can comfortably wear a seatbelt, and you can safely maneuver your car and apply brakes. °e. You may have sexual intercourse when it is comfortable.  °9. FOLLOW UP in our office °a. Please call [facility] at [phone] to set up an appointment to see your surgeon in the office for a follow-up appointment approximately 1-2 weeks after your surgery. °b. Make sure that you call for this appointment the day you arrive home to insure a convenient appointment time. °10. IF YOU HAVE DISABILITY OR FAMILY LEAVE FORMS, BRING THEM TO THE OFFICE FOR PROCESSING.  DO NOT GIVE THEM TO YOUR DOCTOR. ° ° °WHEN TO CALL US [phone]: °1. Poor pain control °2. Reactions / problems with new medications (rash/itching, nausea, etc)  °3. Fever over 101.5 F (38.5 C) °4. Inability to urinate °5. Nausea and/or vomiting °6. Worsening swelling or bruising °7. Continued bleeding from incision. °8. Increased pain, redness, or drainage from the incision ° °The clinic staff is available to answer your questions during regular business hours (8:30am-5pm).  Please don’t hesitate to call and ask to speak to one of our nurses for clinical concerns.   A surgeon from [facility] is always on call at the hospitals °  °If you have a medical emergency, go to the nearest emergency room or call 911. °  ° °[facility] °[address], [address], [place], [place]  [address] ? °MAIN: [phone] ? TOLL FREE: [phone] ? °FAX [phone] °[email or web address] ° ° °

## 2016-09-10 NOTE — Progress Notes (Signed)
Pt was discharged home today. Instructions were reviewed with patient, and questions were answered. Pt was taken to main entrance via wheelchair by NT.  

## 2016-09-10 NOTE — Progress Notes (Signed)
Pt has significant bruising around incision site. Notified PA who came to assess site. Holding lovenox today. PA is updating MD.

## 2016-09-28 ENCOUNTER — Telehealth: Payer: Self-pay | Admitting: Family Medicine

## 2016-09-28 NOTE — Telephone Encounter (Signed)
Scheduled

## 2016-10-01 NOTE — Discharge Summary (Signed)
Patient ID: PURVA VESSELL [id number] 50 y.o. January 27, 1966  09/02/2016  Discharge date and time: 09/10/2016  9:54 AM  Admitting Physician: Rosario Adie  Discharge Physician: Rosario Adie.  Admission Diagnoses: rectal cancer  Discharge Diagnoses: ileostomy  Operations: Procedure(s): ILEOSTOMY REVERSAL ANAL EXAM UNDER ANESTHESIA    Discharged Condition: good    Hospital Course: Patient underwent ileostomy reversal.  Her diet was advanced as tolerated.  She was discharged once tolerating a diet.  Consults: None  Significant Diagnostic Studies: labs: cbc, chemistry  Treatments: surgery: see above  Disposition: Home

## 2016-10-05 ENCOUNTER — Encounter: Payer: Self-pay | Admitting: Endocrinology

## 2016-10-05 ENCOUNTER — Ambulatory Visit (INDEPENDENT_AMBULATORY_CARE_PROVIDER_SITE_OTHER): Payer: BLUE CROSS/BLUE SHIELD | Admitting: Endocrinology

## 2016-10-05 VITALS — BP 158/98 | HR 113 | Ht 62.0 in | Wt 209.0 lb

## 2016-10-05 DIAGNOSIS — E890 Postprocedural hypothyroidism: Secondary | ICD-10-CM

## 2016-10-05 LAB — TSH: TSH: 6.82 u[IU]/mL — AB (ref 0.35–4.50)

## 2016-10-05 LAB — T4, FREE: FREE T4: 0.81 ng/dL (ref 0.60–1.60)

## 2016-10-05 MED ORDER — LEVOTHYROXINE SODIUM 112 MCG PO TABS: 112.0000 ug | ORAL_TABLET | Freq: Every day | ORAL | 3 refills | Status: DC

## 2016-10-05 NOTE — Patient Instructions (Signed)
blood tests are requested for you today.  We'll let you know about the results.   Please see your PCP in 1-2 months. I would be happy to see you back here as needed.

## 2016-10-05 NOTE — Progress Notes (Signed)
Subjective:    Patient ID: Destiny [name], female    DOB: January 21, 1966, 51 y.o.   MRN: [id number]  HPI Pt returns for f/u of post-RAI hypothyroidism (Grave's dz was dx'ed in 2016; tapazole was chosen as initial rx, but she eventually had RAI in Dec of 2017; synthroid was started in March, 2018).  pt states she feels well in general.  She takes synthroid as rx'ed.  Past Medical History:  Diagnosis Date  . Cancer ([place]) 10/29/15   rectal invasice adenocarcinoma   . Chronic back pain   . Depression   . Hypertension   . Hyperthyroidism   . Rectal cancer ([place])    Adenocarcinoma dx 10/29/15; Stage III, T3, N1, MO    Past Surgical History:  Procedure Laterality Date  . ABDOMINAL HYSTERECTOMY     vaginal   . BLADDER SURGERY    . EYE SURGERY     LAZY EYE CORRECTION  . ILEOSTOMY    . ILEOSTOMY CLOSURE N/A 09/02/2016   Procedure: ILEOSTOMY REVERSAL;  Surgeon: Leighton Ruff, MD;  Location: [facility];  Service: General;  Laterality: N/A;  . RECTAL EXAM UNDER ANESTHESIA N/A 09/02/2016   Procedure: ANAL EXAM UNDER ANESTHESIA;  Surgeon: Leighton Ruff, MD;  Location: [facility];  Service: General;  Laterality: N/A;  . URETERAL REIMPLANTION Left 02/26/2016   Procedure: left ureteral neocystotomy,double J stent placement, closure vaginal cuff;  Surgeon: Ardis Hughs, MD;  Location: [facility];  Service: Urology;  Laterality: Left;  Marland Kitchen VAGINAL HYSTERECTOMY    . XI ROBOTIC ASSISTED LOWER ANTERIOR RESECTION N/A 02/26/2016   Procedure: XI ROBOTIC ASSISTED LOWER ANTERIOR RESECTION, diverting LOOP ILEOSTOMY, mobilization splenic flexure, omentopexy;  Surgeon: Leighton Ruff, MD;  Location: [facility];  Service: General;  Laterality: N/A;    Social History   Social History  . Marital status: Divorced    Spouse name: N/A  . Number of children: 3  . Years of education: N/A   Occupational History  . Not on file.   Social History Main Topics  . Smoking status: Never Smoker  . Smokeless tobacco: Never Used  . Alcohol  use No     Comment: occ  . Drug use: No  . Sexual activity: Yes    Birth control/ protection: Surgical   Other Topics Concern  . Not on file   Social History Narrative  . No narrative on file    Current Outpatient Prescriptions on File Prior to Visit  Medication Sig Dispense Refill  . acetaminophen (TYLENOL) 500 MG tablet Take 1,000 mg by mouth every 8 (eight) hours as needed for moderate pain.    . capecitabine (XELODA) 500 MG tablet Take 4 tabs (2,000 mg) QAM 3 tabs (1,500 mg) QPM. Take after meals Days 1-14 of 21 day cycle 98 tablet 0  . feeding supplement, ENSURE ENLIVE, (ENSURE ENLIVE) LIQD Take 237 mLs by mouth 2 (two) times daily between meals. (Patient taking differently: Take 237 mLs by mouth 2 (two) times daily. ) 237 mL 12  . ferrous sulfate 325 (65 FE) MG tablet Take 325 mg by mouth daily with breakfast.    . gabapentin (NEURONTIN) 300 MG capsule 1 CAPSULE TODAY, TWICE A DAY FRIDAY, THEN 3 TIMES A DAY FOR 30 DAYS (Patient taking differently: TAKE 1 CAPSULE (300 MG) BY MOUTH 3 TIMES DAILY) 93 capsule 1  . loperamide (IMODIUM) 2 MG capsule Take 1 capsule (2 mg total) by mouth 3 (three) times daily as needed for diarrhea or loose stools. (  Patient taking differently: Take 2-4 mg by mouth 3 (three) times daily as needed for diarrhea or loose stools. ) 30 capsule 0  . LORazepam (ATIVAN) 0.5 MG tablet TAKE  (1)  TABLET TWICE A DAY. (Patient taking differently: TAKE 1 TABLET (0.5 MG) BY MOUTH AT BEDTIME.) 60 tablet 1  . methocarbamol (ROBAXIN) 500 MG tablet TAKE (2) TABLETS 4 TIMES A DAY AS NEEDED FOR MUSCLE RELAXATION. (Patient taking differently: TAKE1 TABLET (500 MG) BY MOUTH TWICE DAILY) 60 tablet 0  . nystatin cream (MYCOSTATIN) Apply 1 application topically 2 (two) times daily. (Patient taking differently: Apply 1 application topically 2 (two) times daily as needed (for dry/itching skin.). ) 30 g 0  . ondansetron (ZOFRAN) 8 MG tablet Take 1 tablet (8 mg total) by mouth every 8  (eight) hours as needed for nausea or vomiting. (Patient taking differently: Take 8 mg by mouth every 8 (eight) hours as needed for nausea or vomiting. For nausea/vomiting) 30 tablet 0  . oxyCODONE (OXY IR/ROXICODONE) 5 MG immediate release tablet Take 1-2 tablets (5-10 mg total) by mouth every 4 (four) hours as needed for moderate pain or severe pain. 30 tablet 0  . psyllium (HYDROCIL/METAMUCIL) 95 % PACK Take 1 packet by mouth daily. 56 each   . sertraline (ZOLOFT) 100 MG tablet Take 1 tablet (100 mg total) by mouth daily. 30 tablet 5  . traZODone (DESYREL) 50 MG tablet Take 0.5-1 tablets (25-50 mg total) by mouth at bedtime as needed for sleep. 30 tablet 3  . vitamin C (ASCORBIC ACID) 500 MG tablet Take 500 mg by mouth daily.     No current facility-administered medications on file prior to visit.     Allergies  Allergen Reactions  . Latex Itching and Rash    Family History  Problem Relation Age of Onset  . Thyroid disease Mother   . Lung cancer Father     lung  . Cirrhosis Brother     BP (!) 158/98   Pulse (!) 113   Ht 5\' 2"  (1.575 m)   Wt 209 lb (94.8 kg)   LMP  (LMP Unknown) Comment: FULL   SpO2 98%   BMI 38.23 kg/m    Review of Systems Denies dey skin    Objective:   Physical Exam VITAL SIGNS:  See vs page GENERAL: no distress head: no deformity  eyes: no periorbital swelling, no proptosis  NECK: thyroid is again noted to be slightly and diffusely enlarged.  No palpable nodule.   Skin: not diaphoretic.   Neuro: no tremor.   Lab Results  Component Value Date   TSH 6.82 (H) 10/05/2016   T4TOTAL 32.3 (HH) 02/18/2015      Assessment & Plan:  elev BP and HR.  May be situational; she should f/u with PCP.   Post-RAI hypothyroidism: she needs increased rx.   Patient Instructions  blood tests are requested for you today.  We'll let you know about the results.   Please see your PCP in 1-2 months. I would be happy to see you back here as needed.

## 2016-10-20 ENCOUNTER — Emergency Department (HOSPITAL_COMMUNITY): Payer: BLUE CROSS/BLUE SHIELD

## 2016-10-20 ENCOUNTER — Ambulatory Visit (INDEPENDENT_AMBULATORY_CARE_PROVIDER_SITE_OTHER): Payer: BLUE CROSS/BLUE SHIELD | Admitting: Family

## 2016-10-20 ENCOUNTER — Encounter (HOSPITAL_COMMUNITY): Payer: Self-pay | Admitting: Emergency Medicine

## 2016-10-20 ENCOUNTER — Inpatient Hospital Stay (HOSPITAL_COMMUNITY)
Admission: EM | Admit: 2016-10-20 | Discharge: 2016-10-22 | DRG: 872 | Disposition: A | Discharge status: Home / Self Care | Payer: BLUE CROSS/BLUE SHIELD | Attending: Internal Medicine | Admitting: Internal Medicine

## 2016-10-20 ENCOUNTER — Encounter: Payer: Self-pay | Admitting: Family

## 2016-10-20 VITALS — BP 147/95 | HR 96 | Temp 101.3°F | Ht 62.0 in | Wt 203.0 lb

## 2016-10-20 DIAGNOSIS — R509 Fever, unspecified: Secondary | ICD-10-CM | POA: Diagnosis not present

## 2016-10-20 DIAGNOSIS — A419 Sepsis, unspecified organism: Principal | ICD-10-CM | POA: Diagnosis present

## 2016-10-20 DIAGNOSIS — E1159 Type 2 diabetes mellitus with other circulatory complications: Secondary | ICD-10-CM | POA: Diagnosis present

## 2016-10-20 DIAGNOSIS — R51 Headache: Secondary | ICD-10-CM | POA: Diagnosis not present

## 2016-10-20 DIAGNOSIS — F32A Depression, unspecified: Secondary | ICD-10-CM | POA: Diagnosis present

## 2016-10-20 DIAGNOSIS — C20 Malignant neoplasm of rectum: Secondary | ICD-10-CM | POA: Diagnosis present

## 2016-10-20 DIAGNOSIS — N390 Urinary tract infection, site not specified: Secondary | ICD-10-CM | POA: Diagnosis present

## 2016-10-20 DIAGNOSIS — R519 Headache, unspecified: Secondary | ICD-10-CM

## 2016-10-20 DIAGNOSIS — Z91040 Latex allergy status: Secondary | ICD-10-CM

## 2016-10-20 DIAGNOSIS — Z79899 Other long term (current) drug therapy: Secondary | ICD-10-CM

## 2016-10-20 DIAGNOSIS — E039 Hypothyroidism, unspecified: Secondary | ICD-10-CM | POA: Diagnosis present

## 2016-10-20 DIAGNOSIS — R112 Nausea with vomiting, unspecified: Secondary | ICD-10-CM

## 2016-10-20 DIAGNOSIS — R2689 Other abnormalities of gait and mobility: Secondary | ICD-10-CM | POA: Diagnosis not present

## 2016-10-20 DIAGNOSIS — G8929 Other chronic pain: Secondary | ICD-10-CM | POA: Diagnosis present

## 2016-10-20 DIAGNOSIS — I10 Essential (primary) hypertension: Secondary | ICD-10-CM | POA: Diagnosis present

## 2016-10-20 DIAGNOSIS — F329 Major depressive disorder, single episode, unspecified: Secondary | ICD-10-CM | POA: Diagnosis present

## 2016-10-20 LAB — CBC WITH DIFFERENTIAL/PLATELET
BASOS ABS: 0 10*3/uL (ref 0.0–0.1)
Basophils Relative: 0 %
EOS PCT: 0 %
Eosinophils Absolute: 0 10*3/uL (ref 0.0–0.7)
HCT: 34.3 % — ABNORMAL LOW (ref 36.0–46.0)
Hemoglobin: 11.8 g/dL — ABNORMAL LOW (ref 12.0–15.0)
Lymphocytes Relative: 12 %
Lymphs Abs: 0.9 10*3/uL (ref 0.7–4.0)
MCH: 28.8 pg (ref 26.0–34.0)
MCHC: 34.4 g/dL (ref 30.0–36.0)
MCV: 83.7 fL (ref 78.0–100.0)
Monocytes Absolute: 0.7 10*3/uL (ref 0.1–1.0)
Monocytes Relative: 9 %
Neutro Abs: 5.6 10*3/uL (ref 1.7–7.7)
Neutrophils Relative %: 79 %
PLATELETS: 274 10*3/uL (ref 150–400)
RBC: 4.1 MIL/uL (ref 3.87–5.11)
RDW: 12.5 % (ref 11.5–15.5)
WBC: 7.2 10*3/uL (ref 4.0–10.5)

## 2016-10-20 LAB — URINALYSIS, ROUTINE W REFLEX MICROSCOPIC
Bilirubin Urine: NEGATIVE
Glucose, UA: NEGATIVE mg/dL
Ketones, ur: NEGATIVE mg/dL
Nitrite: NEGATIVE
Protein, ur: >=300 mg/dL — AB
Specific Gravity, Urine: 1.023 (ref 1.005–1.030)
pH: 5 (ref 5.0–8.0)

## 2016-10-20 LAB — COMPREHENSIVE METABOLIC PANEL
ALK PHOS: 77 U/L (ref 38–126)
ALT: 10 U/L — AB (ref 14–54)
AST: 14 U/L — AB (ref 15–41)
Albumin: 3.9 g/dL (ref 3.5–5.0)
Anion gap: 9 (ref 5–15)
BILIRUBIN TOTAL: 1 mg/dL (ref 0.3–1.2)
BUN: 12 mg/dL (ref 6–20)
CALCIUM: 9 mg/dL (ref 8.9–10.3)
CO2: 25 mmol/L (ref 22–32)
CREATININE: 0.76 mg/dL (ref 0.44–1.00)
Chloride: 100 mmol/L — ABNORMAL LOW (ref 101–111)
GFR calc Af Amer: >60 mL/min (ref >60)
Glucose, Bld: 140 mg/dL — ABNORMAL HIGH (ref 65–99)
Potassium: 3.6 mmol/L (ref 3.5–5.1)
Sodium: 134 mmol/L — ABNORMAL LOW (ref 135–145)
TOTAL PROTEIN: 7.9 g/dL (ref 6.5–8.1)

## 2016-10-20 LAB — I-STAT CG4 LACTIC ACID, ED: Lactic Acid, Venous: 1.01 mmol/L (ref 0.5–1.9)

## 2016-10-20 MED ORDER — DEXAMETHASONE SODIUM PHOSPHATE 10 MG/ML IJ SOLN
10.0000 mg | Freq: Once | INTRAMUSCULAR | Status: AC
  Administered 2016-10-20: 10 mg via INTRAVENOUS
  Filled 2016-10-20: qty 1

## 2016-10-20 MED ORDER — SODIUM CHLORIDE 0.9 % IV SOLN
INTRAVENOUS | Status: AC
  Filled 2016-10-20: qty 2000

## 2016-10-20 MED ORDER — ACETAMINOPHEN 500 MG PO TABS
1000.0000 mg | ORAL_TABLET | Freq: Once | ORAL | Status: AC
  Administered 2016-10-20: 1000 mg via ORAL
  Filled 2016-10-20: qty 2

## 2016-10-20 MED ORDER — ONDANSETRON HCL 4 MG/2ML IJ SOLN
4.0000 mg | Freq: Once | INTRAMUSCULAR | Status: AC
  Administered 2016-10-20: 4 mg via INTRAVENOUS
  Filled 2016-10-20: qty 2

## 2016-10-20 MED ORDER — HYDROMORPHONE HCL 1 MG/ML IJ SOLN
1.0000 mg | Freq: Once | INTRAMUSCULAR | Status: AC
  Administered 2016-10-20: 1 mg via INTRAVENOUS
  Filled 2016-10-20: qty 1

## 2016-10-20 MED ORDER — SODIUM CHLORIDE 0.9 % IV SOLN: 1000.0000 mL | INTRAVENOUS | Status: DC

## 2016-10-20 MED ORDER — VANCOMYCIN HCL IN DEXTROSE 1-5 GM/200ML-% IV SOLN
1000.0000 mg | Freq: Once | INTRAVENOUS | Status: AC
  Administered 2016-10-20: 1000 mg via INTRAVENOUS
  Filled 2016-10-20: qty 200

## 2016-10-20 MED ORDER — SODIUM CHLORIDE 0.9 % IV BOLUS (SEPSIS)
1000.0000 mL | Freq: Once | INTRAVENOUS | Status: AC
  Administered 2016-10-20: 1000 mL via INTRAVENOUS

## 2016-10-20 MED ORDER — SODIUM CHLORIDE 0.9 % IV SOLN
2.0000 g | Freq: Once | INTRAVENOUS | Status: AC
  Administered 2016-10-20: 2 g via INTRAVENOUS
  Filled 2016-10-20 (×2): qty 2000

## 2016-10-20 MED ORDER — DEXTROSE 5 % IV SOLN
2.0000 g | Freq: Once | INTRAVENOUS | Status: AC
  Administered 2016-10-20: 2 g via INTRAVENOUS
  Filled 2016-10-20: qty 2

## 2016-10-20 NOTE — ED Provider Notes (Addendum)
[place] DEPT Provider Note   CSN: [id number] Arrival date & time: 10/20/16  1719     History   Chief Complaint Chief Complaint  Patient presents with  . Headache    HPI Destiny [name] is a 51 y.o. female.  Patient with a history of colon cancer no recent chemotherapy. Followed by hematology oncology at [facility] long. Patient on Sunday started with a right temple area headache now radiating to the right occiput area. Patient subjectively did not know she had fevers. She had nausea and vomiting yesterday. Also has had a cough nonproductive. Some mild abdominal pain intermittently. No body aches. No back pain. No known sick exposures. No sore throat.  Patient seen by primary care provider [facility]. They noted that patient had a fever. Patient arrived here with a temp of 10 34F. And slightly tachycardic no hypotension question saturations were normal. Patient doesn't normally have a history of headaches.      Past Medical History:  Diagnosis Date  . Cancer ([place]) 10/29/15   rectal invasice adenocarcinoma   . Chronic back pain   . Depression   . Hypertension   . Hyperthyroidism   . Rectal cancer ([place])    Adenocarcinoma dx 10/29/15; Stage III, T3, N1, MO    Patient Active Problem List   Diagnosis Date Noted  . Hypothyroidism 08/24/2016  . Rectal cancer ([place]) 02/26/2016  . Malignant neoplasm of rectum ([place]) 11/17/2015  . Anemia 10/01/2015  . Essential hypertension, benign 09/02/2015  . Obesity 09/02/2015  . Depression 05/02/2015  . Closed fracture of nasal bone 02/18/2015    Past Surgical History:  Procedure Laterality Date  . ABDOMINAL HYSTERECTOMY     vaginal   . BLADDER SURGERY    . EYE SURGERY     LAZY EYE CORRECTION  . ILEOSTOMY    . ILEOSTOMY CLOSURE N/A 09/02/2016   Procedure: ILEOSTOMY REVERSAL;  Surgeon: Leighton Ruff, MD;  Location: [facility] ORS;  Service: General;  Laterality: N/A;  . RECTAL EXAM UNDER ANESTHESIA N/A 09/02/2016   Procedure: ANAL EXAM UNDER ANESTHESIA;  Surgeon: Leighton Ruff, MD;  Location: [facility]S;  Service: General;  Laterality: N/A;  . URETERAL REIMPLANTION Left 02/26/2016   Procedure: left ureteral neocystotomy,double J stent placement, closure vaginal cuff;  Surgeon: Ardis Hughs, MD;  Location: [facility] ORS;  Service: Urology;  Laterality: Left;  Marland Kitchen VAGINAL HYSTERECTOMY    . XI ROBOTIC ASSISTED LOWER ANTERIOR RESECTION N/A 02/26/2016   Procedure: XI ROBOTIC ASSISTED LOWER ANTERIOR RESECTION, diverting LOOP ILEOSTOMY, mobilization splenic flexure, omentopexy;  Surgeon: Leighton Ruff, MD;  Location: [facility];  Service: General;  Laterality: N/A;    OB History    No data available       Home Medications    Prior to Admission medications   Medication Sig Start Date End Date Taking? Authorizing Provider  acetaminophen (TYLENOL) 500 MG tablet Take 1,000 mg by mouth every 8 (eight) hours as needed for moderate pain.   Yes [provider]  ferrous sulfate 325 (65 FE) MG tablet Take 325 mg by mouth daily with breakfast.   Yes [provider]  gabapentin (NEURONTIN) 300 MG capsule 1 CAPSULE TODAY, TWICE A DAY FRIDAY, THEN 3 TIMES A DAY FOR 30 DAYS Patient taking differently: TAKE 1 CAPSULE (300 MG) BY MOUTH 3 TIMES DAILY 05/13/16  Yes Dettinger, Fransisca Kaufmann, MD  levothyroxine (SYNTHROID, LEVOTHROID) 112 MCG tablet Take 1 tablet (112 mcg total) by mouth daily before breakfast. 10/05/16  Yes Renato Shin, MD  loperamide (IMODIUM) 2 MG capsule Take 1 capsule (2 mg total) by mouth 3 (three) times daily as needed for diarrhea or loose stools. Patient taking differently: Take 2-4 mg by mouth 3 (three) times daily as needed for diarrhea or loose stools.  04/22/16  Yes Hawks, Christy A, FNP  LORazepam (ATIVAN) 0.5 MG tablet TAKE  (1)  TABLET TWICE A DAY. Patient taking differently: TAKE 1 TABLET (0.5 MG) BY MOUTH AT BEDTIME AS NEEDED FOR ANXIETY 08/26/15  Yes Dettinger, Fransisca Kaufmann, MD  methocarbamol  (ROBAXIN) 500 MG tablet TAKE (2) TABLETS 4 TIMES A DAY AS NEEDED FOR MUSCLE RELAXATION. Patient taking differently: TAKE1 TABLET (500 MG) BY MOUTH TWICE DAILY 04/20/16  Yes Dettinger, Fransisca Kaufmann, MD  nystatin cream (MYCOSTATIN) Apply 1 application topically 2 (two) times daily. Patient taking differently: Apply 1 application topically 2 (two) times daily as needed (for dry/itching skin.).  05/29/16  Yes Curcio, Roselie Awkward, NP  ondansetron (ZOFRAN) 8 MG tablet Take 1 tablet (8 mg total) by mouth every 8 (eight) hours as needed for nausea or vomiting. Patient taking differently: Take 8 mg by mouth every 8 (eight) hours as needed for nausea or vomiting. For nausea/vomiting 06/19/16  Yes Ladell Pier, MD  oxyCODONE (OXY IR/ROXICODONE) 5 MG immediate release tablet Take 1-2 tablets (5-10 mg total) by mouth every 4 (four) hours as needed for moderate pain or severe pain. 09/10/16  Yes Simaan, Darci Current, PA-C  sertraline (ZOLOFT) 100 MG tablet Take 1 tablet (100 mg total) by mouth daily. 03/26/16  Yes Hawks, Christy A, FNP  traZODone (DESYREL) 50 MG tablet Take 0.5-1 tablets (25-50 mg total) by mouth at bedtime as needed for sleep. 06/23/16  Yes Dettinger, Fransisca Kaufmann, MD  vitamin C (ASCORBIC ACID) 500 MG tablet Take 500 mg by mouth daily.   Yes [provider]    Family History Family History  Problem Relation Age of Onset  . Thyroid disease Mother   . Lung cancer Father        lung  . Cirrhosis Brother     Social History Social History  Substance Use Topics  . Smoking status: Never Smoker  . Smokeless tobacco: Never Used  . Alcohol use No     Comment: occ     Allergies   Latex   Review of Systems Review of Systems  Constitutional: Negative for fever.  HENT: Negative for congestion.   Eyes: Negative for pain.  Respiratory: Positive for cough. Negative for shortness of breath.   Cardiovascular: Negative for chest pain.  Gastrointestinal: Positive for abdominal pain and  nausea.  Genitourinary: Negative for dysuria and hematuria.  Musculoskeletal: Negative for back pain and neck stiffness.  Skin: Negative for rash.  Neurological: Positive for headaches. Negative for seizures, syncope, weakness and numbness.  Hematological: Does not bruise/bleed easily.  Psychiatric/Behavioral: Negative for confusion.     Physical Exam Updated Vital Signs BP 135/73   Pulse 83   Temp 98 F (36.7 C) (Oral)   Resp 19   Ht 1.549 m (5\' 1" ) Comment: Simultaneous filing. User may not have seen previous data.  Wt 92.1 kg (203 lb)   LMP  (LMP Unknown) Comment: FULL   SpO2 98%   BMI 38.36 kg/m   Physical Exam  Constitutional: She is oriented to person, place, and time. She appears well-developed and well-nourished. No distress.  HENT:  Head: Normocephalic and atraumatic.  Mouth/Throat: Oropharynx is clear and moist.  Eyes: Conjunctivae  and EOM are normal. Pupils are equal, round, and reactive to light.  Neck: Normal range of motion. Neck supple.  No posterior neck tenderness. Good range of motion in neck no stiffness.  Cardiovascular: Normal rate, regular rhythm and normal heart sounds.   Pulmonary/Chest: Effort normal and breath sounds normal. No respiratory distress.  Abdominal: Soft. Bowel sounds are normal. There is no tenderness.  Musculoskeletal: Normal range of motion. She exhibits no edema.  Neurological: She is alert and oriented to person, place, and time. No cranial nerve deficit or sensory deficit. She exhibits normal muscle tone. Coordination normal.  Skin: Skin is warm. No rash noted.  Nursing note and vitals reviewed.    ED Treatments / Results  Labs (all labs ordered are listed, but only abnormal results are displayed) Labs Reviewed  COMPREHENSIVE METABOLIC PANEL - Abnormal; Notable for the following:       Result Value   Sodium 134 (*)    Chloride 100 (*)    Glucose, Bld 140 (*)    AST 14 (*)    ALT 10 (*)    All other components within  normal limits  CBC WITH DIFFERENTIAL/PLATELET - Abnormal; Notable for the following:    Hemoglobin 11.8 (*)    HCT 34.3 (*)    All other components within normal limits  URINALYSIS, ROUTINE W REFLEX MICROSCOPIC - Abnormal; Notable for the following:    Color, Urine AMBER (*)    APPearance CLOUDY (*)    Hgb urine dipstick LARGE (*)    Protein, ur >=300 (*)    Leukocytes, UA MODERATE (*)    Bacteria, UA RARE (*)    Squamous Epithelial / LPF 0-5 (*)    All other components within normal limits  CULTURE, BLOOD (ROUTINE X 2)  CULTURE, BLOOD (ROUTINE X 2)  URINE CULTURE  I-STAT CG4 LACTIC ACID, ED  I-STAT CG4 LACTIC ACID, ED    EKG  EKG Interpretation  Date/Time:  Tuesday Oct 20 2016 20:06:30 EDT Ventricular Rate:  87 PR Interval:    QRS Duration: 78 QT Interval:  368 QTC Calculation: 443 R Axis:   65 Text Interpretation:  Sinus rhythm Confirmed by Fredia Sorrow [phone]) on 10/20/2016 8:12:48 PM       Radiology Dg Chest 2 View  Result Date: 10/20/2016 CLINICAL DATA:  Fever cough and headache EXAM: CHEST  2 VIEW COMPARISON:  11/01/2015, 05/06/2014 FINDINGS: Mild cardiomegaly. No focal pulmonary infiltrate, consolidation or pleural effusion. No pneumothorax. IMPRESSION: No active cardiopulmonary disease. Electronically Signed   By: Donavan Foil M.D.   On: 10/20/2016 21:26   Ct Head Wo Contrast  Result Date: 10/20/2016 CLINICAL DATA:  51 y/o F; headache and fever. History of colon cancer. EXAM: CT HEAD WITHOUT CONTRAST TECHNIQUE: Contiguous axial images were obtained from the base of the skull through the vertex without intravenous contrast. COMPARISON:  02/08/2015 CT head FINDINGS: Brain: No evidence of acute infarction, hemorrhage, hydrocephalus, extra-axial collection or mass lesion/mass effect. Vascular: No hyperdense vessel or unexpected calcification. Skull: Normal. Negative for fracture or focal lesion. Sinuses/Orbits: No acute finding. Other: None. IMPRESSION: No acute  intracranial abnormality.  Unremarkable CT of the head. Electronically Signed   By: Kristine Garbe M.D.   On: 10/20/2016 21:40    Procedures Procedures (including critical care time)  Medications Ordered in ED Medications  ampicillin (OMNIPEN) 2 g in sodium chloride 0.9 % 50 mL IVPB (not administered)  0.9 %  sodium chloride infusion (not administered)  dexamethasone (DECADRON) injection 10  mg (10 mg Intravenous Given 10/20/16 2025)  cefTRIAXone (ROCEPHIN) 2 g in dextrose 5 % 50 mL IVPB (0 g Intravenous Stopped 10/20/16 2130)  vancomycin (VANCOCIN) IVPB 1000 mg/200 mL premix (0 mg Intravenous Stopped 10/20/16 2218)  acetaminophen (TYLENOL) tablet 1,000 mg (1,000 mg Oral Given 10/20/16 2010)  HYDROmorphone (DILAUDID) injection 1 mg (1 mg Intravenous Given 10/20/16 2025)  ondansetron (ZOFRAN) injection 4 mg (4 mg Intravenous Given 10/20/16 2025)  sodium chloride 0.9 % bolus 1,000 mL (0 mLs Intravenous Stopped 10/20/16 2130)  HYDROmorphone (DILAUDID) injection 1 mg (1 mg Intravenous Given 10/20/16 2129)     Initial Impression / Assessment and Plan / ED Course  I have reviewed the triage vital signs and the nursing notes.  Pertinent labs & imaging results that were available during my care of the patient were reviewed by me and considered in my medical decision making (see chart for details).    [facility] from [facility] for concerns for meningitis but has a history of colon cancer. In remission. Patient's head pain started in the temple area on the left which is a little unusual for metastases patient's had the symptoms now since Sunday. Most likely not bacterial meningitis can she was untreated. But viral meningitis not completely ruled out. Head CT negative for any masses. Patient's urine definitely very concerning for urinary tract infection and culture sent. Patient was treated when she first arrived as if it could be meningitis with appropriate meningeal  antibiotics.  Patient's chest x-ray negative no evidence of pneumonia. Antibiotics given for concerns for meningitis would also treat her urinary tract infection.  Nausea controlled with antinausea medicine and pain improved with the hydromorphone.  Addendum:  Patient had difficulty deciding whether she wanted the lumbar puncture done or not. She has decided and agreed to have it done.  Consent was signed for the LP. Landmarks identified. Patient anesthetized the with 1/2 mL of 1% lidocaine. No complicating factors the LP was unsuccessful able to get the needle to pass in between the bony structures are properly. Patient has a history of chronic back pain. Not able to arch her back very well at all.  Discussed with hospitalist about admission and reattempt in the morning with fluoroscopy directed lumbar puncture.  Patient overall improved.       Final Clinical Impressions(s) / ED Diagnoses   Final diagnoses:  Lower urinary tract infectious disease  Fever, unspecified fever cause  Acute intractable headache, unspecified headache type    New Prescriptions New Prescriptions   No medications on file     Fredia Sorrow, MD 10/20/16 2232    Fredia Sorrow, MD 10/21/16 [phone]

## 2016-10-20 NOTE — Progress Notes (Signed)
Pharmacy Note:  Initial antibiotics for Vancomycin, Rocephin and Ampicillin ordered by EDP for Sepsis.  Estimated Creatinine Clearance: 87 mL/min (by C-G formula based on SCr of 0.76 mg/dL).   Allergies  Allergen Reactions  . Latex Itching and Rash    Vitals:   10/20/16 1725 10/20/16 2030  BP: (!) 158/88 (!) 146/75  Pulse: 100 89  Resp: 18 19  Temp: 99.5 F (37.5 C)     Anti-infectives    Start     Dose/Rate Route Frequency Ordered Stop   10/20/16 1945  cefTRIAXone (ROCEPHIN) 2 g in dextrose 5 % 50 mL IVPB     2 g 100 mL/hr over 30 Minutes Intravenous  Once 10/20/16 1944 10/20/16 2055   10/20/16 1945  vancomycin (VANCOCIN) IVPB 1000 mg/200 mL premix     1,000 mg 200 mL/hr over 60 Minutes Intravenous  Once 10/20/16 1945     10/20/16 1945  ampicillin (OMNIPEN) 2 g in sodium chloride 0.9 % 50 mL IVPB     2 g 150 mL/hr over 20 Minutes Intravenous  Once 10/20/16 1945       Plan: Initial doses of  X 1 ordered. F/U admission orders for further dosing if therapy continued.  Pricilla Larsson, [facility] 10/20/2016 9:12 PM

## 2016-10-20 NOTE — Progress Notes (Signed)
   Subjective:    Patient ID: Destiny [name], female    DOB: Jun 29, 1965, 51 y.o.   MRN: [id number]  Headache   This is a new problem. The current episode started in the past 7 days. The problem occurs constantly. The problem has been unchanged. The pain is located in the temporal and frontal region. The pain does not radiate. The pain quality is not similar to prior headaches. The quality of the pain is described as aching. The pain is at a severity of 8/10. The pain is mild. Associated symptoms include a loss of balance, phonophobia, photophobia and vomiting. Pertinent negatives include no blurred vision, ear pain or eye pain. Exacerbated by: bending over. She has tried acetaminophen and oral narcotics for the symptoms. The treatment provided no relief. Her past medical history is significant for migraines in the family. There is no history of migraine headaches.  Emesis   Associated symptoms include headaches.      Review of Systems  HENT: Negative for ear pain.   Eyes: Positive for photophobia. Negative for blurred vision and pain.  Gastrointestinal: Positive for vomiting.  Neurological: Positive for headaches and loss of balance.  All other systems reviewed and are negative.      Objective:   Physical Exam  Constitutional: She is oriented to person, place, and time. She appears well-developed and well-nourished. No distress.  HENT:  Head: Normocephalic and atraumatic.  Right Ear: External ear normal.  Left Ear: External ear normal.  Mouth/Throat: Oropharynx is clear and moist.  Eyes: Pupils are equal, round, and reactive to light.  Neck: Normal range of motion. Neck supple. No thyromegaly present.  Cardiovascular: Normal rate, regular rhythm, normal heart sounds and intact distal pulses.   No murmur heard. Pulmonary/Chest: Effort normal and breath sounds normal. No respiratory distress. She has no wheezes.  Abdominal: Soft. Bowel sounds are normal. She exhibits no distension. There  is no tenderness.  Musculoskeletal: Normal range of motion. She exhibits no edema or tenderness.  Neurological: She is alert and oriented to person, place, and time.  Skin: Skin is warm and dry.  Psychiatric: She has a normal mood and affect. Her behavior is normal. Judgment and thought content normal.  Vitals reviewed.    BP (!) 147/95   Pulse 96   Temp (!) 101 F (38.3 C) (Oral)   Ht _0  (1.575 m)   Wt 203 lb (92.1 kg)   LMP  (LMP Unknown) Comment: FULL   BMI 37.13 kg/m       Assessment & Plan:  1. Acute intractable headache, unspecified headache type - CT Head Wo Contrast; Future - CMP14+EGFR - CBC with Differential/Platelet  2. Fever, unspecified fever cause - CMP14+EGFR - CBC with Differential/Platelet   3. Nausea and vomiting, intractability of vomiting not specified, unspecified vomiting type  4. Loss of balance    Will send to ED because of fever and headache- differential diagnosis meningitis?? Will cancel CT head and labs

## 2016-10-20 NOTE — ED Triage Notes (Signed)
Patient complains of headache x 3 days. States nausea and vomiting x 2 in past 24 hours.

## 2016-10-20 NOTE — ED Notes (Signed)
ED Provider at bedside. 

## 2016-10-20 NOTE — Patient Instructions (Signed)
Fever, Adult °A fever is an increase in the body’s temperature. It is usually defined as a temperature of 100°F (38°C) or higher. Brief mild or moderate fevers generally have no long-term effects, and they often do not require treatment. Moderate or high fevers may make you feel uncomfortable and can sometimes be a sign of a serious illness or disease. The sweating that may occur with repeated or prolonged fever may also cause dehydration. °Fever is confirmed by taking a temperature with a thermometer. A measured temperature can vary with: °· Age. °· Time of day. °· Location of the thermometer: °¨ Mouth (oral). °¨ Rectum (rectal). °¨ Ear (tympanic). °¨ Underarm (axillary). °¨ Forehead (temporal). °Follow these instructions at home: °Pay attention to any changes in your symptoms. Take these actions to help with your condition: °· Take over-the counter and prescription medicines only as told by your health care provider. Follow the dosing instructions carefully. °· If you were prescribed an antibiotic medicine, take it as told by your health care provider. Do not stop taking the antibiotic even if you start to feel better. °· Rest as needed. °· Drink enough fluid to keep your urine clear or pale yellow. This helps to prevent dehydration. °· Sponge yourself or bathe with room-temperature water to help reduce your body temperature as needed. Do not use ice water. °· Do not overbundle yourself in blankets or heavy clothes. °Contact a health care provider if: °· You vomit. °· You cannot eat or drink without vomiting. °· You have diarrhea. °· You have pain when you urinate. °· Your symptoms do not improve with treatment. °· You develop new symptoms. °· You develop excessive weakness. °Get help right away if: °· You have shortness of breath or have trouble breathing. °· You are dizzy or you faint. °· You are disoriented or confused. °· You develop signs of dehydration, such as a dry mouth, decreased urination, or  paleness. °· You develop severe pain in your abdomen. °· You have persistent vomiting or diarrhea. °· You develop a skin rash. °· Your symptoms suddenly get worse. °This information is not intended to replace advice given to you by your health care provider. Make sure you discuss any questions you have with your health care provider. °Document Released: 11/11/2000 Document Revised: 10/24/2015 Document Reviewed: 07/12/2014 °Elsevier Interactive Patient Education © 2017 [employer] Inc[employer] ° °

## 2016-10-21 DIAGNOSIS — I10 Essential (primary) hypertension: Secondary | ICD-10-CM | POA: Diagnosis present

## 2016-10-21 DIAGNOSIS — G8929 Other chronic pain: Secondary | ICD-10-CM | POA: Diagnosis present

## 2016-10-21 DIAGNOSIS — A419 Sepsis, unspecified organism: Principal | ICD-10-CM

## 2016-10-21 DIAGNOSIS — C20 Malignant neoplasm of rectum: Secondary | ICD-10-CM | POA: Diagnosis present

## 2016-10-21 DIAGNOSIS — R509 Fever, unspecified: Secondary | ICD-10-CM | POA: Diagnosis not present

## 2016-10-21 DIAGNOSIS — R519 Headache, unspecified: Secondary | ICD-10-CM | POA: Insufficient documentation

## 2016-10-21 DIAGNOSIS — F329 Major depressive disorder, single episode, unspecified: Secondary | ICD-10-CM | POA: Diagnosis present

## 2016-10-21 DIAGNOSIS — Z91040 Latex allergy status: Secondary | ICD-10-CM | POA: Diagnosis not present

## 2016-10-21 DIAGNOSIS — N390 Urinary tract infection, site not specified: Secondary | ICD-10-CM | POA: Diagnosis present

## 2016-10-21 DIAGNOSIS — R51 Headache: Secondary | ICD-10-CM

## 2016-10-21 DIAGNOSIS — F331 Major depressive disorder, recurrent, moderate: Secondary | ICD-10-CM | POA: Diagnosis not present

## 2016-10-21 DIAGNOSIS — Z79899 Other long term (current) drug therapy: Secondary | ICD-10-CM | POA: Diagnosis not present

## 2016-10-21 DIAGNOSIS — E039 Hypothyroidism, unspecified: Secondary | ICD-10-CM | POA: Diagnosis present

## 2016-10-21 LAB — CBC
HCT: 35 % — ABNORMAL LOW (ref 36.0–46.0)
Hemoglobin: 11.8 g/dL — ABNORMAL LOW (ref 12.0–15.0)
MCH: 28.4 pg (ref 26.0–34.0)
MCHC: 33.7 g/dL (ref 30.0–36.0)
MCV: 84.3 fL (ref 78.0–100.0)
PLATELETS: 281 10*3/uL (ref 150–400)
RBC: 4.15 MIL/uL (ref 3.87–5.11)
RDW: 12.2 % (ref 11.5–15.5)
WBC: 4.4 10*3/uL (ref 4.0–10.5)

## 2016-10-21 LAB — BASIC METABOLIC PANEL
Anion gap: 10 (ref 5–15)
BUN: 12 mg/dL (ref 6–20)
CO2: 24 mmol/L (ref 22–32)
CREATININE: 0.75 mg/dL (ref 0.44–1.00)
Calcium: 9 mg/dL (ref 8.9–10.3)
Chloride: 103 mmol/L (ref 101–111)
GFR calc Af Amer: >60 mL/min (ref >60)
Glucose, Bld: 279 mg/dL — ABNORMAL HIGH (ref 65–99)
Potassium: 5.4 mmol/L — ABNORMAL HIGH (ref 3.5–5.1)
SODIUM: 137 mmol/L (ref 135–145)

## 2016-10-21 MED ORDER — LORAZEPAM 0.5 MG PO TABS
0.5000 mg | ORAL_TABLET | Freq: Every evening | ORAL | Status: DC | PRN
  Administered 2016-10-21: 0.5 mg via ORAL
  Filled 2016-10-21: qty 1

## 2016-10-21 MED ORDER — FERROUS SULFATE 325 (65 FE) MG PO TABS
325.0000 mg | ORAL_TABLET | Freq: Every day | ORAL | Status: DC
  Administered 2016-10-21 – 2016-10-22 (×2): 325 mg via ORAL
  Filled 2016-10-21 (×2): qty 1

## 2016-10-21 MED ORDER — DIPHENOXYLATE-ATROPINE 2.5-0.025 MG PO TABS
1.0000 | ORAL_TABLET | Freq: Three times a day (TID) | ORAL | Status: DC | PRN
  Administered 2016-10-21 – 2016-10-22 (×4): 1 via ORAL
  Filled 2016-10-21 (×4): qty 1

## 2016-10-21 MED ORDER — GABAPENTIN 300 MG PO CAPS
300.0000 mg | ORAL_CAPSULE | Freq: Three times a day (TID) | ORAL | Status: DC
  Administered 2016-10-21 – 2016-10-22 (×3): 300 mg via ORAL
  Filled 2016-10-21 (×3): qty 1

## 2016-10-21 MED ORDER — SERTRALINE HCL 50 MG PO TABS
100.0000 mg | ORAL_TABLET | Freq: Every day | ORAL | Status: DC
  Administered 2016-10-21 – 2016-10-22 (×2): 100 mg via ORAL
  Filled 2016-10-21 (×2): qty 2

## 2016-10-21 MED ORDER — METHOCARBAMOL 500 MG PO TABS: 500.0000 mg | ORAL_TABLET | Freq: Four times a day (QID) | ORAL | Status: DC | PRN

## 2016-10-21 MED ORDER — VITAMIN C 500 MG PO TABS
500.0000 mg | ORAL_TABLET | Freq: Every day | ORAL | Status: DC
  Administered 2016-10-21 – 2016-10-22 (×2): 500 mg via ORAL
  Filled 2016-10-21 (×2): qty 1

## 2016-10-21 MED ORDER — GABAPENTIN 600 MG PO TABS
300.0000 mg | ORAL_TABLET | Freq: Three times a day (TID) | ORAL | Status: DC
  Filled 2016-10-21 (×6): qty 0.5

## 2016-10-21 MED ORDER — TRAZODONE HCL 50 MG PO TABS
25.0000 mg | ORAL_TABLET | Freq: Every evening | ORAL | Status: DC | PRN
  Administered 2016-10-21: 25 mg via ORAL
  Filled 2016-10-21: qty 1

## 2016-10-21 MED ORDER — ONDANSETRON HCL 4 MG/2ML IJ SOLN: 4.0000 mg | Freq: Four times a day (QID) | INTRAMUSCULAR | Status: DC | PRN

## 2016-10-21 MED ORDER — SODIUM CHLORIDE 0.9 % IV SOLN
INTRAVENOUS | Status: DC
  Administered 2016-10-21 – 2016-10-22 (×2): via INTRAVENOUS

## 2016-10-21 MED ORDER — ACETAMINOPHEN 325 MG PO TABS
650.0000 mg | ORAL_TABLET | Freq: Four times a day (QID) | ORAL | Status: DC | PRN
  Administered 2016-10-21: 650 mg via ORAL
  Filled 2016-10-21: qty 2

## 2016-10-21 MED ORDER — IBUPROFEN 800 MG PO TABS: 800.0000 mg | ORAL_TABLET | Freq: Four times a day (QID) | ORAL | Status: DC | PRN

## 2016-10-21 MED ORDER — SODIUM CHLORIDE 0.9% FLUSH: 3.0000 mL | INTRAVENOUS | Status: DC | PRN

## 2016-10-21 MED ORDER — SODIUM CHLORIDE 0.9% FLUSH
3.0000 mL | Freq: Two times a day (BID) | INTRAVENOUS | Status: DC
Start: 2016-10-21 — End: 2016-10-22
  Administered 2016-10-21: 3 mL via INTRAVENOUS

## 2016-10-21 MED ORDER — DEXTROSE 5 % IV SOLN
1.0000 g | INTRAVENOUS | Status: DC
  Filled 2016-10-21: qty 10

## 2016-10-21 MED ORDER — DEXTROSE 5 % IV SOLN
2.0000 g | Freq: Two times a day (BID) | INTRAVENOUS | Status: DC
  Administered 2016-10-21: 2 g via INTRAVENOUS
  Filled 2016-10-21: qty 2

## 2016-10-21 MED ORDER — SODIUM CHLORIDE 0.9 % IV SOLN: 250.0000 mL | INTRAVENOUS | Status: DC | PRN

## 2016-10-21 MED ORDER — ACETAMINOPHEN 650 MG RE SUPP: 650.0000 mg | Freq: Four times a day (QID) | RECTAL | Status: DC | PRN

## 2016-10-21 MED ORDER — VANCOMYCIN HCL IN DEXTROSE 1-5 GM/200ML-% IV SOLN: 1000.0000 mg | Freq: Three times a day (TID) | INTRAVENOUS | Status: DC

## 2016-10-21 MED ORDER — LEVOTHYROXINE SODIUM 112 MCG PO TABS
112.0000 ug | ORAL_TABLET | Freq: Every day | ORAL | Status: DC
  Administered 2016-10-21 – 2016-10-22 (×2): 112 ug via ORAL
  Filled 2016-10-21 (×2): qty 1

## 2016-10-21 MED ORDER — ONDANSETRON HCL 4 MG PO TABS: 4.0000 mg | ORAL_TABLET | Freq: Four times a day (QID) | ORAL | Status: DC | PRN

## 2016-10-21 MED ORDER — OXYCODONE HCL 5 MG PO TABS
5.0000 mg | ORAL_TABLET | ORAL | Status: DC | PRN
  Administered 2016-10-21 – 2016-10-22 (×2): 5 mg via ORAL
  Filled 2016-10-21 (×2): qty 1

## 2016-10-21 NOTE — Progress Notes (Signed)
Inpatient Diabetes Program Recommendations  AACE/ADA: New Consensus Statement on Inpatient Glycemic Control (2015)  Target Ranges:  Prepandial:   less than 140 mg/dL      Peak postprandial:   less than 180 mg/dL (1-2 hours)      Critically ill patients:  140 - 180 mg/dL  Results for Destiny [name], Destiny [name] (MRN [id number]) as of 10/21/2016 08:37  Ref. Range 10/20/2016 20:05 10/21/2016 05:30  Glucose Latest Ref Range: 65 - 99 mg/dL 140 (H) 279 (H)   Results for Destiny [name], Destiny [name] (MRN [id number]) as of 10/21/2016 08:37  Ref. Range 05/02/2015 10:53 02/10/2016 14:25  Hemoglobin A1C Latest Ref Range: 4.8 - 5.6 % 5.5 5.2   Review of Glycemic Control  Diabetes history: NO Outpatient Diabetes medications: NA Current orders for Inpatient glycemic control: None  Inpatient Diabetes Program Recommendations: HgbA1C: Initial glucose 140 mg/dl and fasting 279 mg/dl this morning. Noted patient received Decadron 10 mg x1 on 10/20/16. Please consider adding an A1C to blood in lab (if available) to evaluate glycemic control 2-3 months.  Thanks, Barnie Alderman, RN, MSN, CDE Diabetes Coordinator Inpatient Diabetes Program [phone] (Team Pager from 8am to 5pm)

## 2016-10-21 NOTE — Progress Notes (Signed)
Patient notified me to inquire about where her regular medications were and about new abx that the MD ordered for her.  After reviewing her chart I saw that there were no new orders except for IVF's. I discussed with the patient above and let her know I paged the MD and requested her medication per her request.

## 2016-10-21 NOTE — Progress Notes (Signed)
Pharmacy Antibiotic Note  Destiny [name] is a 51 y.o. female admitted on 10/20/2016 with UTI.  Pharmacy has been consulted for ceftriaxone dosing. Tmax 101.3, LA 1, WBC normal. Antibiotics deescalated.  Plan: Ceftriaxone 1gm IV q24h F/U cxs and clinical progress  Height: 5' 1.5" (156.2 cm) Weight: 203 lb 8 oz (92.3 kg) IBW/kg (Calculated) : 48.95  Temp (24hrs), Avg:99 F (37.2 C), Min:97.4 F (36.3 C), Max:101.3 F (38.5 C)   Recent Labs Lab 10/20/16 2005 10/20/16 2041 10/21/16 0530  WBC 7.2  --  4.4  CREATININE 0.76  --  0.75  LATICACIDVEN  --  1.01  --     Estimated Creatinine Clearance: 88.1 mL/min (by C-G formula based on SCr of 0.75 mg/dL).    Allergies  Allergen Reactions  . Latex Itching and Rash    Antimicrobials this admission: Vanc 5/22 x 1  >>5/23 Rocephin 5/22 >> Ampicillin 5/22 >> 5/23   Dose adjustments this admission: 5/22 Ceftriaxone 1gm IV q24h  Microbiology results: 5/22 BCx: ngtd 5/22 UCx: pending 4/4 MRSA PCR: negative  Thank you for allowing pharmacy to be a part of this patient's care.  Isac Sarna, BS [place], [place] Clinical Pharmacist Pager [phone] 10/21/2016 8:58 AM

## 2016-10-21 NOTE — Progress Notes (Signed)
PROGRESS NOTE    Destiny [name]  ION:[id number] DOB: Oct 23, 1965 DOA: 10/20/2016 PCP: Dettinger, Fransisca Kaufmann, MD    Brief Narrative:  51 yo female presents with the chief complain of headache. Patient known to have rectal cancer sp treatment, completed a couple months before admission. Headache has been present for 2 days, on the temporal region, associated with vomiting. On the initial physical examination temperature was 101.3 F, with heart rate of 96 and blood pressure 149/96. Mucous membranes were moist, lugs were clear to auscultation, heart with rhythmic S1 and S2, abdomen soft and no lower extremity edema. Chest x ray clear for infiltrates. Unable to perform LP due to chronic pain. Urine positive for infection. Patient was admitted with working diagnosis of sepsis due to urine infection.    Assessment & Plan:   Principal Problem:   Fever Active Problems:   Depression   Essential hypertension, benign   Rectal cancer ([place])   1. Urine infection with sepsis. Will de-escalate antibiotic therapy to ceftriaxone 1 gram per day, will dc vancomycin, low pretest probability for meningitis, will hold on lumbar puncture for now. Will continue to monitor cell count, temperature curve and cultures. Continue hydration with isotonic saline at 75 cc/hr.   2. Rectal cancer. Patient had radiation, surgery and chemotherapy, has residual incontinence after reversion of colostomy. Possible urina tract contamination.   3. HTN. Patient not on antihypertensive medications at home, blood pressure systolic 244 to 010.  4. Depression. Will resume lorazepam, sertraline and trazodone. On oxycodone as needed for pain.   5. Hypothyroidism. Will resume levothyroxine.   DVT prophylaxis: enoxaparin  Code Status:Full  Family Communication:  Disposition Plan: Home   Consultants:    Procedures:     Antimicrobials:   Ceftriaxone    Subjective: Patient feeling better, still dark color urine, but improved.  No nausea or vomiting, no chest pain or abdominal pain. Patient had urine infection in the past, in relation to bowel incontinence.   Objective: Vitals:   10/20/16 2315 10/20/16 2330 10/21/16 0145 10/21/16 0558  BP:  (!) 135/59 (!) 145/82 120/71  Pulse:  80 72 63  Resp:  12 16 18   Temp: 97.9 F (36.6 C)  97.7 F (36.5 C) 97.4 F (36.3 C)  TempSrc: Oral  Oral Oral  SpO2:  98% 98% 96%  Weight:   92.3 kg (203 lb 8 oz)   Height:   5' 1.5" (1.562 m)     Intake/Output Summary (Last 24 hours) at 10/21/16 0758 Last data filed at 10/21/16 0600  Gross per 24 hour  Intake             1300 ml  Output                0 ml  Net             1300 ml   Filed Weights   10/20/16 1725 10/21/16 0145  Weight: 92.1 kg (203 lb) 92.3 kg (203 lb 8 oz)    Examination:  General exam: deconditioned E ENT. Mild pallor, no icterus, oral mucosa moist.  Respiratory system: Clear to auscultation. Respiratory effort normal. No wheezing, rales or rhonchi.  Cardiovascular system: S1 & S2 heard, RRR. No JVD, murmurs, rubs, gallops or clicks. No pedal edema. Gastrointestinal system: Abdomen is nondistended, soft and nontender. No organomegaly or masses felt. Normal bowel sounds heard. Central nervous system: Alert and oriented. No focal neurological deficits. Extremities: Symmetric 5 x 5 power. Skin: No rashes, lesions  or ulcers     Data Reviewed: I have personally reviewed following labs and imaging studies  CBC:  Recent Labs Lab 10/20/16 2005 10/21/16 0530  WBC 7.2 4.4  NEUTROABS 5.6  --   HGB 11.8* 11.8*  HCT 34.3* 35.0*  MCV 83.7 84.3  PLT 274 660   Basic Metabolic Panel:  Recent Labs Lab 10/20/16 2005  NA 134*  K 3.6  CL 100*  CO2 25  GLUCOSE 140*  BUN 12  CREATININE 0.76  CALCIUM 9.0   GFR: Estimated Creatinine Clearance: 88.1 mL/min (by C-G formula based on SCr of 0.76 mg/dL). Liver Function Tests:  Recent Labs Lab 10/20/16 2005  AST 14*  ALT 10*  ALKPHOS 77  BILITOT  1.0  PROT 7.9  ALBUMIN 3.9   No results for input(s): LIPASE, AMYLASE in the last 168 hours. No results for input(s): AMMONIA in the last 168 hours. Coagulation Profile: No results for input(s): INR, PROTIME in the last 168 hours. Cardiac Enzymes: No results for input(s): CKTOTAL, CKMB, CKMBINDEX, TROPONINI in the last 168 hours. BNP (last 3 results) No results for input(s): PROBNP in the last 8760 hours. HbA1C: No results for input(s): HGBA1C in the last 72 hours. CBG: No results for input(s): GLUCAP in the last 168 hours. Lipid Profile: No results for input(s): CHOL, HDL, LDLCALC, TRIG, CHOLHDL, LDLDIRECT in the last 72 hours. Thyroid Function Tests: No results for input(s): TSH, T4TOTAL, FREET4, T3FREE, THYROIDAB in the last 72 hours. Anemia Panel: No results for input(s): VITAMINB12, FOLATE, FERRITIN, TIBC, IRON, RETICCTPCT in the last 72 hours. Sepsis Labs:  Recent Labs Lab 10/20/16 2041  LATICACIDVEN 1.01    Recent Results (from the past 240 hour(s))  Blood Culture (routine x 2)     Status: None (Preliminary result)   Collection Time: 10/20/16  7:55 PM  Result Value Ref Range Status   Specimen Description BLOOD LEFT FOREARM  Final   Special Requests Blood Culture adequate volume  Final   Culture PENDING  Incomplete   Report Status PENDING  Incomplete  Blood Culture (routine x 2)     Status: None (Preliminary result)   Collection Time: 10/20/16  8:20 PM  Result Value Ref Range Status   Specimen Description RIGHT ANTECUBITAL  Final   Special Requests Blood Culture adequate volume  Final   Culture PENDING  Incomplete   Report Status PENDING  Incomplete         Radiology Studies: Dg Chest 2 View  Result Date: 10/20/2016 CLINICAL DATA:  Fever cough and headache EXAM: CHEST  2 VIEW COMPARISON:  11/01/2015, 05/06/2014 FINDINGS: Mild cardiomegaly. No focal pulmonary infiltrate, consolidation or pleural effusion. No pneumothorax. IMPRESSION: No active  cardiopulmonary disease. Electronically Signed   By: Donavan Foil M.D.   On: 10/20/2016 21:26   Ct Head Wo Contrast  Result Date: 10/20/2016 CLINICAL DATA:  51 y/o F; headache and fever. History of colon cancer. EXAM: CT HEAD WITHOUT CONTRAST TECHNIQUE: Contiguous axial images were obtained from the base of the skull through the vertex without intravenous contrast. COMPARISON:  02/08/2015 CT head FINDINGS: Brain: No evidence of acute infarction, hemorrhage, hydrocephalus, extra-axial collection or mass lesion/mass effect. Vascular: No hyperdense vessel or unexpected calcification. Skull: Normal. Negative for fracture or focal lesion. Sinuses/Orbits: No acute finding. Other: None. IMPRESSION: No acute intracranial abnormality.  Unremarkable CT of the head. Electronically Signed   By: Kristine Garbe M.D.   On: 10/20/2016 21:40        Scheduled Meds: .  sodium chloride flush  3 mL Intravenous Q12H   Continuous Infusions: . sodium chloride    . cefTRIAXone (ROCEPHIN)  IV    . vancomycin       LOS: 0 days       Mauricio Gerome Apley, MD [facility] Pager [phone]  If 7PM-7AM, please contact night-coverage [email or web address] Password [facility] 10/21/2016, 7:58 AM

## 2016-10-21 NOTE — H&P (Signed)
History and Physical    [name] HQI:[id number] DOB: 07-16-1965 DOA: 10/20/2016  PCP: Dettinger, Fransisca Kaufmann, MD  Patient coming from: home  Chief Complaint:   headache  HPI: Destiny [name] is a 51 y.o. female with medical history significant of rectal cancer s/p treatment completed a couple of months ago, htn comes in with 2 days of headache (no prev h/o headaches) temporal region with vomiting.  No previous h/o migraines in past.  She denies any neck pain or stiffness.  No rashes.  No urinary symptoms.  Some mild cough.  No nasal congestion.  No sick contacts.  Found to have fever in ED, concern was for meningitis and several attempts at LP were done and failed by EDP due to pt having chronic back pain and inable to bend forward much.  Her headache has completely resolved at this time with treatment in ED and is feeling better.  Pt referred for admission for her fever and headache.   Review of Systems: As per HPI otherwise 10 point review of systems negative.   Past Medical History:  Diagnosis Date  . Cancer ([place]) 10/29/15   rectal invasice adenocarcinoma   . Chronic back pain   . Depression   . Hypertension   . Hyperthyroidism   . Rectal cancer ([place])    Adenocarcinoma dx 10/29/15; Stage III, T3, N1, MO    Past Surgical History:  Procedure Laterality Date  . ABDOMINAL HYSTERECTOMY     vaginal   . BLADDER SURGERY    . EYE SURGERY     LAZY EYE CORRECTION  . ILEOSTOMY    . ILEOSTOMY CLOSURE N/A 09/02/2016   Procedure: ILEOSTOMY REVERSAL;  Surgeon: Leighton Ruff, MD;  Location: [facility];  Service: General;  Laterality: N/A;  . RECTAL EXAM UNDER ANESTHESIA N/A 09/02/2016   Procedure: ANAL EXAM UNDER ANESTHESIA;  Surgeon: Leighton Ruff, MD;  Location: [facility];  Service: General;  Laterality: N/A;  . URETERAL REIMPLANTION Left 02/26/2016   Procedure: left ureteral neocystotomy,double J stent placement, closure vaginal cuff;  Surgeon: Ardis Hughs, MD;  Location: [facility];  Service: Urology;   Laterality: Left;  Marland Kitchen VAGINAL HYSTERECTOMY    . XI ROBOTIC ASSISTED LOWER ANTERIOR RESECTION N/A 02/26/2016   Procedure: XI ROBOTIC ASSISTED LOWER ANTERIOR RESECTION, diverting LOOP ILEOSTOMY, mobilization splenic flexure, omentopexy;  Surgeon: Leighton Ruff, MD;  Location: [facility];  Service: General;  Laterality: N/A;     reports that she has never smoked. She has never used smokeless tobacco. She reports that she does not drink alcohol or use drugs.  Allergies  Allergen Reactions  . Latex Itching and Rash    Family History  Problem Relation Age of Onset  . Thyroid disease Mother   . Lung cancer Father        lung  . Cirrhosis Brother     Prior to Admission medications   Medication Sig Start Date End Date Taking? Authorizing Provider  acetaminophen (TYLENOL) 500 MG tablet Take 1,000 mg by mouth every 8 (eight) hours as needed for moderate pain.   Yes [provider]  ferrous sulfate 325 (65 FE) MG tablet Take 325 mg by mouth daily with breakfast.   Yes [provider]  gabapentin (NEURONTIN) 300 MG capsule 1 CAPSULE TODAY, TWICE A DAY FRIDAY, THEN 3 TIMES A DAY FOR 30 DAYS Patient taking differently: TAKE 1 CAPSULE (300 MG) BY MOUTH 3 TIMES DAILY 05/13/16  Yes Dettinger, Fransisca Kaufmann, MD  levothyroxine ([place], [place])  112 MCG tablet Take 1 tablet (112 mcg total) by mouth daily before breakfast. 10/05/16  Yes Renato Shin, MD  loperamide (IMODIUM) 2 MG capsule Take 1 capsule (2 mg total) by mouth 3 (three) times daily as needed for diarrhea or loose stools. Patient taking differently: Take 2-4 mg by mouth 3 (three) times daily as needed for diarrhea or loose stools.  04/22/16  Yes Hawks, Christy A, FNP  LORazepam (ATIVAN) 0.5 MG tablet TAKE  (1)  TABLET TWICE A DAY. Patient taking differently: TAKE 1 TABLET (0.5 MG) BY MOUTH AT BEDTIME AS NEEDED FOR ANXIETY 08/26/15  Yes Dettinger, Fransisca Kaufmann, MD  methocarbamol (ROBAXIN) 500 MG tablet TAKE (2) TABLETS 4 TIMES A DAY AS  NEEDED FOR MUSCLE RELAXATION. Patient taking differently: TAKE1 TABLET (500 MG) BY MOUTH TWICE DAILY 04/20/16  Yes Dettinger, Fransisca Kaufmann, MD  nystatin cream (MYCOSTATIN) Apply 1 application topically 2 (two) times daily. Patient taking differently: Apply 1 application topically 2 (two) times daily as needed (for dry/itching skin.).  05/29/16  Yes Curcio, Roselie Awkward, NP  ondansetron (ZOFRAN) 8 MG tablet Take 1 tablet (8 mg total) by mouth every 8 (eight) hours as needed for nausea or vomiting. Patient taking differently: Take 8 mg by mouth every 8 (eight) hours as needed for nausea or vomiting. For nausea/vomiting 06/19/16  Yes Ladell Pier, MD  oxyCODONE (OXY IR/ROXICODONE) 5 MG immediate release tablet Take 1-2 tablets (5-10 mg total) by mouth every 4 (four) hours as needed for moderate pain or severe pain. 09/10/16  Yes Simaan, Darci Current, PA-C  sertraline (ZOLOFT) 100 MG tablet Take 1 tablet (100 mg total) by mouth daily. 03/26/16  Yes Hawks, Christy A, FNP  traZODone (DESYREL) 50 MG tablet Take 0.5-1 tablets (25-50 mg total) by mouth at bedtime as needed for sleep. 06/23/16  Yes Dettinger, Fransisca Kaufmann, MD  vitamin C (ASCORBIC ACID) 500 MG tablet Take 500 mg by mouth daily.   Yes [provider]    Physical Exam: Vitals:   10/20/16 2219 10/20/16 2230 10/20/16 2315 10/20/16 2330  BP:  127/70  (!) 135/59  Pulse:  73  80  Resp:    12  Temp: 98 F (36.7 C)  97.9 F (36.6 C)   TempSrc: Oral  Oral   SpO2:  97%  98%  Weight:      Height:         No meningeal signs Constitutional: NAD, calm, comfortable Vitals:   10/20/16 2219 10/20/16 2230 10/20/16 2315 10/20/16 2330  BP:  127/70  (!) 135/59  Pulse:  73  80  Resp:    12  Temp: 98 F (36.7 C)  97.9 F (36.6 C)   TempSrc: Oral  Oral   SpO2:  97%  98%  Weight:      Height:       Eyes: PERRL, lids and conjunctivae normal ENMT: Mucous membranes are moist. Posterior pharynx clear of any exudate or lesions.Normal dentition.    Neck: normal, supple, no masses, no thyromegaly Respiratory: clear to auscultation bilaterally, no wheezing, no crackles. Normal respiratory effort. No accessory muscle use.  Cardiovascular: Regular rate and rhythm, no murmurs / rubs / gallops. No extremity edema. 2+ pedal pulses. No carotid bruits.  Abdomen: no tenderness, no masses palpated. No hepatosplenomegaly. Bowel sounds positive.  Musculoskeletal: no clubbing / cyanosis. No joint deformity upper and lower extremities. Good ROM, no contractures. Normal muscle tone.  Skin: no rashes, lesions, ulcers. No induration Neurologic: CN 2-12 grossly intact.  Sensation intact, DTR normal. Strength 5/5 in all 4.  Psychiatric: Normal judgment and insight. Alert and oriented x 3. Normal mood.    Labs on Admission: I have personally reviewed following labs and imaging studies  CBC:  Recent Labs Lab 10/20/16 2005  WBC 7.2  NEUTROABS 5.6  HGB 11.8*  HCT 34.3*  MCV 83.7  PLT 093   Basic Metabolic Panel:  Recent Labs Lab 10/20/16 2005  NA 134*  K 3.6  CL 100*  CO2 25  GLUCOSE 140*  BUN 12  CREATININE 0.76  CALCIUM 9.0   GFR: Estimated Creatinine Clearance: 87 mL/min (by C-G formula based on SCr of 0.76 mg/dL). Liver Function Tests:  Recent Labs Lab 10/20/16 2005  AST 14*  ALT 10*  ALKPHOS 77  BILITOT 1.0  PROT 7.9  ALBUMIN 3.9   Urine analysis:    Component Value Date/Time   COLORURINE AMBER (A) 10/20/2016 1943   APPEARANCEUR CLOUDY (A) 10/20/2016 1943   APPEARANCEUR Cloudy (A) 03/26/2016 1508   LABSPEC 1.023 10/20/2016 1943   LABSPEC 1.005 07/09/2016 1038   PHURINE 5.0 10/20/2016 1943   GLUCOSEU NEGATIVE 10/20/2016 1943   GLUCOSEU Negative 07/09/2016 1038   [place] (A) 10/20/2016 1943   BILIRUBINUR NEGATIVE 10/20/2016 1943   BILIRUBINUR Negative 07/09/2016 [place] 10/20/2016 1943   PROTEINUR >=300 (A) 10/20/2016 1943   UROBILINOGEN 0.2 07/09/2016 1038   NITRITE NEGATIVE 10/20/2016  1943   LEUKOCYTESUR MODERATE (A) 10/20/2016 1943   LEUKOCYTESUR Moderate 07/09/2016 1038    Recent Results (from the past 240 hour(s))  Blood Culture (routine x 2)     Status: None (Preliminary result)   Collection Time: 10/20/16  7:55 PM  Result Value Ref Range Status   Specimen Description BLOOD LEFT FOREARM  Final   Special Requests Blood Culture adequate volume  Final   Culture PENDING  Incomplete   Report Status PENDING  Incomplete  Blood Culture (routine x 2)     Status: None (Preliminary result)   Collection Time: 10/20/16  8:20 PM  Result Value Ref Range Status   Specimen Description RIGHT ANTECUBITAL  Final   Special Requests Blood Culture adequate volume  Final   Culture PENDING  Incomplete   Report Status PENDING  Incomplete     Radiological Exams on Admission: Dg Chest 2 View  Result Date: 10/20/2016 CLINICAL DATA:  Fever cough and headache EXAM: CHEST  2 VIEW COMPARISON:  11/01/2015, 05/06/2014 FINDINGS: Mild cardiomegaly. No focal pulmonary infiltrate, consolidation or pleural effusion. No pneumothorax. IMPRESSION: No active cardiopulmonary disease. Electronically Signed   By: Donavan Foil M.D.   On: 10/20/2016 21:26   Ct Head Wo Contrast  Result Date: 10/20/2016 CLINICAL DATA:  51 y/o F; headache and fever. History of colon cancer. EXAM: CT HEAD WITHOUT CONTRAST TECHNIQUE: Contiguous axial images were obtained from the base of the skull through the vertex without intravenous contrast. COMPARISON:  02/08/2015 CT head FINDINGS: Brain: No evidence of acute infarction, hemorrhage, hydrocephalus, extra-axial collection or mass lesion/mass effect. Vascular: No hyperdense vessel or unexpected calcification. Skull: Normal. Negative for fracture or focal lesion. Sinuses/Orbits: No acute finding. Other: None. IMPRESSION: No acute intracranial abnormality.  Unremarkable CT of the head. Electronically Signed   By: Kristine Garbe M.D.   On: 10/20/2016 21:40     Assessment/Plan 51 yo female with headache , fever concern for meningitis failure to get LP  Principal Problem:   Fever- pt does not appear toxic and no obvious signs  of bacterial meningitis, could be viral.  Her urine is slighly dirty so that may be source of fever.  Blood and urine cx obtained.  Cannot obtain LP which will make decision on treatment somewhat difficult.  Reassuring however that her headache has completely resolved.  Cont on vanc/rocephin and observe overnight.  Wbc nml along with lactic nml.  Active Problems:   Depression- stable, noted   Essential hypertension, benign- stable, noted   Rectal cancer ([place])- completed treatment about 2 months ago   DVT prophylaxis: scds  Code Status:  full Family Communication: none  Disposition Plan:  Per day team Consults called:  none Admission status:  Observation status   DAVID,RACHAL A MD [facility]  If 7PM-7AM, please contact night-coverage [email or web address] Password [facility]  10/21/2016, 12:52 AM

## 2016-10-22 MED ORDER — CIPROFLOXACIN HCL 250 MG PO TABS: 250.0000 mg | ORAL_TABLET | Freq: Two times a day (BID) | ORAL | 0 refills | Status: AC

## 2016-10-22 NOTE — Care Management Note (Signed)
Case Management Note  Patient Details  Name: Destiny [name] MRN: [id number] Date of Birth: 02/22/1966  Subjective/Objective:                  Pt admitted with fever, chart reviewed for CM needs. Pt from home with family. She has PCP, transportation and insurance with drug coverage.   Action/Plan: Pt discharging home today with self care. No CM needs.   Expected Discharge Date:  10/22/16               Expected Discharge Plan:  Home/Self Care  In-House Referral:  NA  Discharge planning Services  CM Consult  Post Acute Care Choice:  NA Choice offered to:  NA  Status of Service:  Completed, signed off  Sherald Barge, RN 10/22/2016, 9:05 AM

## 2016-10-22 NOTE — Progress Notes (Signed)
Patient discharged home. IV removed - WNL.  Reviewed DC instructions and medications.  Follow up in place.  Verbalizes understanding.  No questions at this time.  In NAD, assisted off unit via [place],

## 2016-10-22 NOTE — Discharge Summary (Addendum)
Physician Discharge Summary  CARRISA KELLER URK:[id number] DOB: November 29, 1965 DOA: 10/20/2016  PCP: Dettinger, Fransisca Kaufmann, MD  Admit date: 10/20/2016 Discharge date: 10/22/2016  Admitted From: Home  Disposition:  Home   Recommendations for Outpatient Follow-up:  1. Follow up with PCP in 1- weeks 2. Patient has been placed on ciprofloxacin for the next 5 days.   Home Health: No  Equipment/Devices: No   Discharge Condition: Stable  CODE STATUS: Full  Diet recommendation: Regular.   Brief/Interim Summary: 51 yo female presents with the chief complain of headache. Patient known to have rectal cancer sp treatment, completed a couple months before admission. Headache has been present for 2 days, on the temporal region, associated with vomiting. On the initial physical examination temperature was 101.3 F, with heart rate of 96 and blood pressure 149/96. Mucous membranes were moist, lugs were clear to auscultation, heart with rhythmic S1 and S2, abdomen soft and no lower extremity edema. Sodium 134, potassium 3.6, chloride 100, bicarbonate 25, glucose 140, BUN 12, creatinine 0.76, AST 14, MLT 10, white count 7.2, hemoglobin 9.8, hematocrit 34.3, platelets 274. Chest x ray clear for infiltrates. Unable to perform LP due to chronic pain. Urine positive for infection. Head CT negative for acute changes. EKG with normal sinus rhythm.  Patient was admitted with working diagnosis of sepsis due to urine infection.   1. Urinary tract infection complicated by sepsis. Patient was admitted to the medical ward with a telemetry monitor, intravenous fluids, initial antibiotic therapy included meningeal coverage, considering patient's headache and fever. Urinalysis was positive for infection, the antibiotics were descalated, patient responded well to monotherapy with third generation cephalosporin, ceftriaxone IV. Cultures have remained negative. Patient will finish antibiotic therapy with ciprofloxacin for 5 more days. Note  that patient has history of left ureteral injury at the low anterior resection, status post left ureteral neocystostomy and left double J stent placement.   2. Hypertension. Blood pressure remained stable, systolic 831-517, patient off antihypertensive agents home.  Will recommend close follow-up as an outpatient.  3. Hypothyroidism. Continue levothyroxine.  4. Depression. Patient will resume, lorazepam, sertraline and trazodone. No agitation or confusion.  5. Rectal cancer. Clinical stage III, T3 N1, status post chemotherapy and surgery.   Late entry: Urine culture predominant positive for proteus mirabilis >100,000 CFU, sensitive to ciprofloxacin E coli which was  10,000 CFU was resistant, will continue ciprofloxacin.   Discharge Diagnoses:  Principal Problem:   Fever Active Problems:   Depression   Essential hypertension, benign   Rectal cancer ([place])   Sepsis ([place])    Discharge Instructions   Allergies as of 10/22/2016      Reactions   Latex Itching, Rash      Medication List    TAKE these medications   acetaminophen 500 MG tablet Commonly known as:  TYLENOL Take 1,000 mg by mouth every 8 (eight) hours as needed for moderate pain.   ciprofloxacin 250 MG tablet Commonly known as:  CIPRO Take 1 tablet (250 mg total) by mouth 2 (two) times daily.   ferrous sulfate 325 (65 FE) MG tablet Take 325 mg by mouth daily with breakfast.   gabapentin 300 MG capsule Commonly known as:  NEURONTIN 1 CAPSULE TODAY, TWICE A DAY FRIDAY, THEN 3 TIMES A DAY FOR 30 DAYS What changed:  See the new instructions.   levothyroxine 112 MCG tablet Commonly known as:  SYNTHROID, LEVOTHROID Take 1 tablet (112 mcg total) by mouth daily before breakfast.   loperamide 2 MG  capsule Commonly known as:  IMODIUM Take 1 capsule (2 mg total) by mouth 3 (three) times daily as needed for diarrhea or loose stools. What changed:  how much to take   LORazepam 0.5 MG tablet Commonly known as:   ATIVAN TAKE  (1)  TABLET TWICE A DAY. What changed:  See the new instructions.   methocarbamol 500 MG tablet Commonly known as:  ROBAXIN TAKE (2) TABLETS 4 TIMES A DAY AS NEEDED FOR MUSCLE RELAXATION. What changed:  See the new instructions.   nystatin cream Commonly known as:  MYCOSTATIN Apply 1 application topically 2 (two) times daily. What changed:  when to take this  reasons to take this   ondansetron 8 MG tablet Commonly known as:  ZOFRAN Take 1 tablet (8 mg total) by mouth every 8 (eight) hours as needed for nausea or vomiting. What changed:  additional instructions   oxyCODONE 5 MG immediate release tablet Commonly known as:  Oxy IR/ROXICODONE Take 1-2 tablets (5-10 mg total) by mouth every 4 (four) hours as needed for moderate pain or severe pain.   sertraline 100 MG tablet Commonly known as:  ZOLOFT Take 1 tablet (100 mg total) by mouth daily.   traZODone 50 MG tablet Commonly known as:  DESYREL Take 0.5-1 tablets (25-50 mg total) by mouth at bedtime as needed for sleep.   vitamin C 500 MG tablet Commonly known as:  ASCORBIC ACID Take 500 mg by mouth daily.       Allergies  Allergen Reactions  . Latex Itching and Rash    Consultations:     Procedures/Studies: Dg Chest 2 View  Result Date: 10/20/2016 CLINICAL DATA:  Fever cough and headache EXAM: CHEST  2 VIEW COMPARISON:  11/01/2015, 05/06/2014 FINDINGS: Mild cardiomegaly. No focal pulmonary infiltrate, consolidation or pleural effusion. No pneumothorax. IMPRESSION: No active cardiopulmonary disease. Electronically Signed   By: Donavan Foil M.D.   On: 10/20/2016 21:26   Ct Head Wo Contrast  Result Date: 10/20/2016 CLINICAL DATA:  51 y/o F; headache and fever. History of colon cancer. EXAM: CT HEAD WITHOUT CONTRAST TECHNIQUE: Contiguous axial images were obtained from the base of the skull through the vertex without intravenous contrast. COMPARISON:  02/08/2015 CT head FINDINGS: Brain: No evidence  of acute infarction, hemorrhage, hydrocephalus, extra-axial collection or mass lesion/mass effect. Vascular: No hyperdense vessel or unexpected calcification. Skull: Normal. Negative for fracture or focal lesion. Sinuses/Orbits: No acute finding. Other: None. IMPRESSION: No acute intracranial abnormality.  Unremarkable CT of the head. Electronically Signed   By: Kristine Garbe M.D.   On: 10/20/2016 21:40       Subjective: Patient feeling better, no further headache, no abdominal pain or urinary symptoms, urine now at normal color, no odor. No nausea or vomiting, no chest pain or dyspnea.   Discharge Exam: Vitals:   10/21/16 2100 10/22/16 0634  BP: 131/75 (!) 104/49  Pulse: 64 (!) 48  Resp: 18 18  Temp: 97.7 F (36.5 C) 97.5 F (36.4 C)   Vitals:   10/21/16 0558 10/21/16 1300 10/21/16 2100 10/22/16 0634  BP: 120/71 116/61 131/75 (!) 104/49  Pulse: 63 78 64 (!) 48  Resp: 18 18 18 18   Temp: 97.4 F (36.3 C) 97.6 F (36.4 C) 97.7 F (36.5 C) 97.5 F (36.4 C)  TempSrc: Oral Oral Oral Oral  SpO2: 96% 97% 99% 98%  Weight:      Height:        General: Pt is alert, awake, not in acute  distress E ENT: no pallor or icterus, oral mucosa moist.  Cardiovascular: RRR, S1/S2 +, no rubs, no gallops Respiratory: CTA bilaterally, no wheezing, no rhonchi Abdominal: Soft, NT, ND, bowel sounds + Extremities: no edema, no cyanosis    The results of significant diagnostics from this hospitalization (including imaging, microbiology, ancillary and laboratory) are listed below for reference.     Microbiology: Recent Results (from the past 240 hour(s))  Blood Culture (routine x 2)     Status: None (Preliminary result)   Collection Time: 10/20/16  7:55 PM  Result Value Ref Range Status   Specimen Description BLOOD LEFT FOREARM  Final   Special Requests Blood Culture adequate volume  Final   Culture NO GROWTH 2 DAYS  Final   Report Status PENDING  Incomplete  Blood Culture  (routine x 2)     Status: None (Preliminary result)   Collection Time: 10/20/16  8:20 PM  Result Value Ref Range Status   Specimen Description RIGHT ANTECUBITAL  Final   Special Requests Blood Culture adequate volume  Final   Culture NO GROWTH 2 DAYS  Final   Report Status PENDING  Incomplete     Labs: BNP (last 3 results) No results for input(s): BNP in the last 8760 hours. Basic Metabolic Panel:  Recent Labs Lab 10/20/16 2005 10/21/16 0530  NA 134* 137  K 3.6 5.4*  CL 100* 103  CO2 25 24  GLUCOSE 140* 279*  BUN 12 12  CREATININE 0.76 0.75  CALCIUM 9.0 9.0   Liver Function Tests:  Recent Labs Lab 10/20/16 2005  AST 14*  ALT 10*  ALKPHOS 77  BILITOT 1.0  PROT 7.9  ALBUMIN 3.9   No results for input(s): LIPASE, AMYLASE in the last 168 hours. No results for input(s): AMMONIA in the last 168 hours. CBC:  Recent Labs Lab 10/20/16 2005 10/21/16 0530  WBC 7.2 4.4  NEUTROABS 5.6  --   HGB 11.8* 11.8*  HCT 34.3* 35.0*  MCV 83.7 84.3  PLT 274 281   Cardiac Enzymes: No results for input(s): CKTOTAL, CKMB, CKMBINDEX, TROPONINI in the last 168 hours. BNP: Invalid input(s): POCBNP CBG: No results for input(s): GLUCAP in the last 168 hours. D-Dimer No results for input(s): DDIMER in the last 72 hours. Hgb A1c No results for input(s): HGBA1C in the last 72 hours. Lipid Profile No results for input(s): CHOL, HDL, LDLCALC, TRIG, CHOLHDL, LDLDIRECT in the last 72 hours. Thyroid function studies No results for input(s): TSH, T4TOTAL, T3FREE, THYROIDAB in the last 72 hours.  Invalid input(s): FREET3 Anemia work up No results for input(s): VITAMINB12, FOLATE, FERRITIN, TIBC, IRON, RETICCTPCT in the last 72 hours. Urinalysis    Component Value Date/Time   COLORURINE AMBER (A) 10/20/2016 1943   APPEARANCEUR CLOUDY (A) 10/20/2016 1943   APPEARANCEUR Cloudy (A) 03/26/2016 1508   LABSPEC 1.023 10/20/2016 1943   LABSPEC 1.005 07/09/2016 1038   PHURINE 5.0  10/20/2016 1943   GLUCOSEU NEGATIVE 10/20/2016 1943   GLUCOSEU Negative 07/09/2016 1038   HGBUR LARGE (A) 10/20/2016 1943   BILIRUBINUR NEGATIVE 10/20/2016 1943   BILIRUBINUR Negative 07/09/2016 [place] 10/20/2016 1943   PROTEINUR >=300 (A) 10/20/2016 1943   UROBILINOGEN 0.2 07/09/2016 1038   NITRITE NEGATIVE 10/20/2016 1943   LEUKOCYTESUR MODERATE (A) 10/20/2016 1943   LEUKOCYTESUR Moderate 07/09/2016 1038   Sepsis Labs Invalid input(s): PROCALCITONIN,  WBC,  LACTICIDVEN Microbiology Recent Results (from the past 240 hour(s))  Blood Culture (routine x 2)  Status: None (Preliminary result)   Collection Time: 10/20/16  7:55 PM  Result Value Ref Range Status   Specimen Description BLOOD LEFT FOREARM  Final   Special Requests Blood Culture adequate volume  Final   Culture NO GROWTH 2 DAYS  Final   Report Status PENDING  Incomplete  Blood Culture (routine x 2)     Status: None (Preliminary result)   Collection Time: 10/20/16  8:20 PM  Result Value Ref Range Status   Specimen Description RIGHT ANTECUBITAL  Final   Special Requests Blood Culture adequate volume  Final   Culture NO GROWTH 2 DAYS  Final   Report Status PENDING  Incomplete     Time coordinating discharge: 45 minutes  SIGNED:   Tawni Millers, MD  [facility] 10/22/2016, 8:36 AM Pager   If 7PM-7AM, please contact night-coverage [email or web address] Password [email or web address]

## 2016-10-24 LAB — URINE CULTURE: Culture: >=100000 — AB

## 2016-10-25 LAB — CULTURE, BLOOD (ROUTINE X 2)
CULTURE: NO GROWTH
Culture: NO GROWTH
Special Requests: ADEQUATE
Special Requests: ADEQUATE

## 2016-10-27 ENCOUNTER — Ambulatory Visit (INDEPENDENT_AMBULATORY_CARE_PROVIDER_SITE_OTHER): Payer: BLUE CROSS/BLUE SHIELD | Admitting: Family

## 2016-10-27 ENCOUNTER — Encounter: Payer: Self-pay | Admitting: Family

## 2016-10-27 VITALS — BP 143/81 | HR 79 | Temp 97.7°F | Ht 61.5 in | Wt 208.0 lb

## 2016-10-27 DIAGNOSIS — Z09 Encounter for follow-up examination after completed treatment for conditions other than malignant neoplasm: Secondary | ICD-10-CM

## 2016-10-27 DIAGNOSIS — R197 Diarrhea, unspecified: Secondary | ICD-10-CM | POA: Diagnosis not present

## 2016-10-27 DIAGNOSIS — C20 Malignant neoplasm of rectum: Secondary | ICD-10-CM

## 2016-10-27 NOTE — Patient Instructions (Signed)

## 2016-10-27 NOTE — Progress Notes (Signed)
   Subjective:    Patient ID: Destiny [name], female    DOB: [birth year], 51 y.o.   MRN: [id number]  Pt presents to the office today with diarrhea. Pt was admitted to the hospital on 10/20/16 and discharged on 10/22/16 for febrile headache and diagnosed with UTI. PT started on ceftriaxone IV and given cipro for 5 days at home. Pt states her diarrhea started on 10/21/16 that has improved slightly. Pt has tried imodium and lomotil. Pt has hx of rectal cancer.  Diarrhea   This is a new problem. The current episode started in the past 7 days. The problem occurs 5 to 10 times per day. The problem has been gradually improving. The patient states that diarrhea awakens her from sleep. Pertinent negatives include no bloating, chills, increased  flatus or vomiting. Risk factors include recent antibiotic use and recent hospitalization.   Hospital notes reviewed.    Review of Systems  Constitutional: Negative for chills.  Gastrointestinal: Positive for diarrhea. Negative for bloating, flatus and vomiting.  All other systems reviewed and are negative.      Objective:   Physical Exam  Constitutional: She is oriented to person, place, and time. She appears well-developed and well-nourished. No distress.  HENT:  Head: Normocephalic.  Eyes: Pupils are equal, round, and reactive to light.  Neck: Normal range of motion. Neck supple. No thyromegaly present.  Cardiovascular: Normal rate, regular rhythm, normal heart sounds and intact distal pulses.   No murmur heard. Pulmonary/Chest: Effort normal and breath sounds normal. No respiratory distress. She has no wheezes.  Abdominal: Soft. Bowel sounds are normal. She exhibits no distension. There is tenderness (lower abd tenderness).  Musculoskeletal: Normal range of motion. She exhibits no edema or tenderness.  Neurological: She is alert and oriented to person, place, and time.  Skin: Skin is warm and dry.  Psychiatric: She has a normal mood and affect. Her  behavior is normal. Judgment and thought content normal.  Vitals reviewed.     BP (!) 143/81   Pulse 79   Temp 97.7 F (36.5 C) (Oral)   Ht 5' 1.5" (1.562 m)   Wt 208 lb (94.3 kg)   LMP  (LMP Unknown) Comment: FULL   BMI 38.66 kg/m      Assessment & Plan:  1. Diarrhea, unspecified type - Stool culture - Clostridium difficile EIA - BMP8+EGFR - CBC with Differential/Platelet  2. Malignant neoplasm of rectum (HCC) - BMP8+EGFR - CBC with Differential/Platelet  3. Hospital discharge follow-up - BMP8+EGFR - CBC with Differential/Platelet  Force fluids Bland diet C Diff pending Continue Imodium as needed Pt has completed cipro RTO prn   Evelina Dun, FNP

## 2016-10-28 ENCOUNTER — Other Ambulatory Visit: Payer: BLUE CROSS/BLUE SHIELD

## 2016-10-28 LAB — CBC WITH DIFFERENTIAL/PLATELET
BASOS ABS: 0 10*3/uL (ref 0.0–0.2)
Basos: 0 %
EOS (ABSOLUTE): 0.3 10*3/uL (ref 0.0–0.4)
EOS: 5 %
Hematocrit: 32.2 % — ABNORMAL LOW (ref 34.0–46.6)
Hemoglobin: 10.7 g/dL — ABNORMAL LOW (ref 11.1–15.9)
IMMATURE GRANULOCYTES: 0 %
Immature Grans (Abs): 0 10*3/uL (ref 0.0–0.1)
Lymphocytes Absolute: 0.7 10*3/uL (ref 0.7–3.1)
Lymphs: 13 %
MCH: 27.4 pg (ref 26.6–33.0)
MCHC: 33.2 g/dL (ref 31.5–35.7)
MCV: 83 fL (ref 79–97)
MONOS ABS: 0.4 10*3/uL (ref 0.1–0.9)
Monocytes: 8 %
NEUTROS PCT: 74 %
Neutrophils Absolute: 3.9 10*3/uL (ref 1.4–7.0)
PLATELETS: 403 10*3/uL — AB (ref 150–379)
RBC: 3.9 x10E6/uL (ref 3.77–5.28)
RDW: 13.2 % (ref 12.3–15.4)
WBC: 5.3 10*3/uL (ref 3.4–10.8)

## 2016-10-28 LAB — BMP8+EGFR
BUN/Creatinine Ratio: 13 (ref 9–23)
BUN: 9 mg/dL (ref 6–24)
CHLORIDE: 103 mmol/L (ref 96–106)
CO2: 24 mmol/L (ref 18–29)
Calcium: 8.6 mg/dL — ABNORMAL LOW (ref 8.7–10.2)
Creatinine, Ser: 0.71 mg/dL (ref 0.57–1.00)
GFR calc Af Amer: 114 mL/min/{1.73_m2} (ref >59)
GFR calc non Af Amer: 99 mL/min/{1.73_m2} (ref >59)
GLUCOSE: 128 mg/dL — AB (ref 65–99)
POTASSIUM: 3.7 mmol/L (ref 3.5–5.2)
SODIUM: 143 mmol/L (ref 134–144)

## 2016-10-29 ENCOUNTER — Ambulatory Visit: Payer: BLUE CROSS/BLUE SHIELD | Admitting: Family Medicine

## 2016-11-03 LAB — STOOL CULTURE: E coli, Shiga toxin Assay: NEGATIVE

## 2016-11-03 LAB — CLOSTRIDIUM DIFFICILE EIA: C DIFFICILE TOXINS A+ B, EIA: NEGATIVE

## 2016-11-05 ENCOUNTER — Other Ambulatory Visit (HOSPITAL_BASED_OUTPATIENT_CLINIC_OR_DEPARTMENT_OTHER): Payer: BLUE CROSS/BLUE SHIELD

## 2016-11-05 ENCOUNTER — Telehealth: Payer: Self-pay | Admitting: Oncology

## 2016-11-05 ENCOUNTER — Ambulatory Visit (HOSPITAL_BASED_OUTPATIENT_CLINIC_OR_DEPARTMENT_OTHER): Payer: BLUE CROSS/BLUE SHIELD | Admitting: Nurse Practitioner

## 2016-11-05 VITALS — BP 153/74 | HR 75 | Temp 97.7°F | Resp 20 | Ht 61.5 in | Wt 208.2 lb

## 2016-11-05 DIAGNOSIS — C20 Malignant neoplasm of rectum: Secondary | ICD-10-CM | POA: Diagnosis not present

## 2016-11-05 DIAGNOSIS — N390 Urinary tract infection, site not specified: Secondary | ICD-10-CM

## 2016-11-05 DIAGNOSIS — I10 Essential (primary) hypertension: Secondary | ICD-10-CM | POA: Diagnosis not present

## 2016-11-05 LAB — CEA (IN HOUSE-CHCC)

## 2016-11-05 NOTE — Telephone Encounter (Signed)
Gave patient avs report and appointments for December. [facility] will contact patient re scan and [facility] will contact patent re GI referral to Dr. Havery Moros.

## 2016-11-05 NOTE — Progress Notes (Signed)
  [facility] OFFICE PROGRESS NOTE   Diagnosis:  Rectal cancer  INTERVAL HISTORY:   Destiny [name] returns as scheduled. She underwent reversal of the ileostomy on 09/02/2016. She was hospitalized 10/20/2016 through 10/22/2016 with UTI/sepsis. She feels she is is slowly recovering from the hospitalization. She developed diarrhea while taking the antibiotic and reports that her "bottom is sore" as a result. She had some nausea with the urinary tract infection. No recent nausea/vomiting. She describes her appetite as "so-so".  Objective:  Vital signs in last 24 hours:  Blood pressure (!) 153/74, pulse 75, temperature 97.7 F (36.5 C), temperature source Oral, resp. rate 20, height 5' 1.5" (1.562 m), weight 208 lb 3.2 oz (94.4 kg), SpO2 100 %.    HEENT: Neck without mass. Lymphatics: No palpable cervical, supra clavicular, axillary or inguinal lymph nodes. Resp: Lungs clear bilaterally. Cardio: Regular rate and rhythm. GI: Abdomen soft and nontender. No organomegaly. Vascular: No leg edema.   Lab Results:  Lab Results  Component Value Date   WBC 5.3 10/27/2016   HGB 10.7 (L) 10/27/2016   HCT 32.2 (L) 10/27/2016   MCV 83 10/27/2016   PLT 403 (H) 10/27/2016   NEUTROABS 3.9 10/27/2016    Imaging:  No results found.  Medications: I have reviewed the patient's current medications.  Assessment/Plan: 1. Rectal cancer-clinical stage III (T3 N1)  No loss of mismatch repair protein expression  T3 N1 tumor beginning at 2 cm from the anal verge on an MRI of the pelvis 11/08/2015  CTs of the chest, abdomen, and pelvis 11/01/2015 with no evidence of distant metastatic disease  Initiation of neoadjuvant radiation/Xeloda 11/25/2015; completion of radiation/Xeloda 01/02/2016  Low anterior resection with a coloanal anastomosis 02/26/2016,ypT2,ypN0. Grade 3 adenocarcinoma, negative resection margins09/27/2017  No BRAF, KRAS, NRAS mutation; Microsatellite status could not  be determined  CT abdomen/pelvis 03/30/2016-no evidence of recurrent rectal cancer  Cycle 1 adjuvant Xeloda 04/14/2016  Cycle 2 adjuvant Xeloda 05/08/2016  Cycle 3 adjuvant Xeloda 05/29/2016  Cycle 4 adjuvant Xeloda 06/20/2016 (completed approximately 10 days)  Cycle 5 adjuvant Xeloda started approximately 07/19/2016  2. History of hypertension  3. Left ureteral injury at the low anterior resection-status post a left ureteral neocystostomy and left double-J stent placement 02/26/2016  4. Hyperthyroidism status post radioactive iodine 05/12/2016  5. Ileostomy reversal 09/02/2016  6. Hospitalization 10/20/2016 through 10/22/2016 with UTI/sepsis.   Disposition: Destiny [name] remains in clinical remission from rectal cancer. We will follow-up on the CEA from today. She will undergo surveillance CT scans in the next few weeks. We made a referral to Dr. Havery Moros for timing of the next colonoscopy. She will return for a follow-up visit and CEA in 6 months. She will contact the office in the interim with any problems.  Plan reviewed with Dr. Benay Spice.    Ned Card ANP/GNP-BC   11/05/2016  10:02 AM

## 2016-11-06 ENCOUNTER — Ambulatory Visit (INDEPENDENT_AMBULATORY_CARE_PROVIDER_SITE_OTHER): Payer: BLUE CROSS/BLUE SHIELD | Admitting: Family Medicine

## 2016-11-06 ENCOUNTER — Encounter: Payer: Self-pay | Admitting: Family Medicine

## 2016-11-06 ENCOUNTER — Other Ambulatory Visit: Payer: Self-pay | Admitting: Family Medicine

## 2016-11-06 VITALS — BP 135/86 | HR 74 | Temp 98.4°F | Ht 61.5 in | Wt 207.0 lb

## 2016-11-06 DIAGNOSIS — M545 Low back pain, unspecified: Secondary | ICD-10-CM

## 2016-11-06 DIAGNOSIS — L209 Atopic dermatitis, unspecified: Secondary | ICD-10-CM

## 2016-11-06 DIAGNOSIS — L2489 Irritant contact dermatitis due to other agents: Secondary | ICD-10-CM

## 2016-11-06 MED ORDER — NYSTATIN 100000 UNIT/GM EX POWD: Freq: Four times a day (QID) | CUTANEOUS | 0 refills | Status: DC

## 2016-11-06 NOTE — Progress Notes (Signed)
BP 135/86   Pulse 74   Temp 98.4 F (36.9 C) (Oral)   Ht 5' 1.5" (1.562 m)   Wt 207 lb (93.9 kg)   LMP  (LMP Unknown) Comment: FULL   BMI 38.48 kg/m    Subjective:    Patient ID: Destiny [name], female    DOB: [birth year], 51 y.o.   MRN: [id number]  HPI: Destiny [name] is a 51 y.o. female presenting on 11/06/2016 for  Irritation in groin area (recently had bag removed, has had several episodes of diarrhea which have irritated her, has used several OTC creams)   HPI Groin irritation and rash Groin irritation and rash that has been going on over the past week since she has started antibiotic and having several episodes of diarrhea. She also recently had her colostomy reversed and is still learning how to control her bowel movements again and having some difficulties with that. She says especially since she went on antibiotic for upper respiratory infection she's been having a lot more diarrhea which is causing a lot of irritation and soreness in her gluteal folds and intertrigonal areas of the groin. She denies any fevers or chills or redness or warmth or drainage. She has been trying to use a butt cream and a and D ointment which helps some but are not helping him get rid of it. She has started using some yogurt to try and help with the diarrhea and we also recommended a probiotic.  Relevant past medical, surgical, family and social history reviewed and updated as indicated. Interim medical history since our last visit reviewed. Allergies and medications reviewed and updated.  Review of Systems  Constitutional: Negative for chills and fever.  Respiratory: Negative for chest tightness and shortness of breath.   Cardiovascular: Negative for chest pain and leg swelling.  Gastrointestinal: Positive for diarrhea. Negative for abdominal pain.  Musculoskeletal: Negative for back pain and gait problem.  Skin: Positive for rash. Negative for color change and wound.  Neurological: Negative for  light-headedness and headaches.  Psychiatric/Behavioral: Negative for agitation and behavioral problems.  All other systems reviewed and are negative.   Per HPI unless specifically indicated above        Objective:    BP 135/86   Pulse 74   Temp 98.4 F (36.9 C) (Oral)   Ht 5' 1.5" (1.562 m)   Wt 207 lb (93.9 kg)   LMP  (LMP Unknown) Comment: FULL   BMI 38.48 kg/m   Wt Readings from Last 3 Encounters:  11/06/16 207 lb (93.9 kg)  11/05/16 208 lb 3.2 oz (94.4 kg)  10/27/16 208 lb (94.3 kg)    Physical Exam  Constitutional: She is oriented to person, place, and time. She appears well-developed and well-nourished. No distress.  Eyes: Conjunctivae are normal.  Cardiovascular: Normal rate, regular rhythm, normal heart sounds and intact distal pulses.   No murmur heard. Pulmonary/Chest: Effort normal and breath sounds normal. No respiratory distress. She has no wheezes. She has no rales.  Genitourinary:     Musculoskeletal: Normal range of motion. She exhibits no edema or tenderness.  Neurological: She is alert and oriented to person, place, and time. Coordination normal.  Skin: Skin is warm and dry. No rash noted. She is not diaphoretic.  Psychiatric: She has a normal mood and affect. Her behavior is normal.  Nursing note and vitals reviewed.       Assessment & Plan:   Problem List Items Addressed This Visit  None    Visit Diagnoses    Irritant contact dermatitis due to other agents    -  Primary   Patient has contact irritant dermatitis in her bottom and groin from the diarrhea that she's had since she's been on antibiotic. Give antifungal powder and reco   Relevant Medications   nystatin (NYSTATIN) powder   Atopic dermatitis, unspecified type       2 spots on left leg, could be from mosquito bite versus eczema on the early side, recommended hydrocortisone       Follow up plan: Return in about 4 weeks (around 12/04/2016), or if symptoms worsen or fail to  improve, for Thyroid recheck and cholesterol and hypertension.  Counseling provided for all of the vaccine components No orders of the defined types were placed in this encounter.   Caryl Pina, MD [facility] 11/06/2016, 1:57 PM

## 2016-11-07 LAB — URINE CULTURE

## 2016-11-09 ENCOUNTER — Telehealth: Payer: Self-pay | Admitting: *Deleted

## 2016-11-09 NOTE — Telephone Encounter (Signed)
-----   Message from Owens Shark, NP sent at 11/05/2016  3:49 PM EDT ----- Please let her know the CEA is normal.

## 2016-11-09 NOTE — Telephone Encounter (Signed)
Telephone call to patient to advise lab results per message from Ned Card, NP. Patient verbalized an understanding and knows to call this office with any concerns or questions in the meantime.

## 2016-11-13 ENCOUNTER — Ambulatory Visit: Payer: BLUE CROSS/BLUE SHIELD | Admitting: Physician Assistant

## 2016-11-17 ENCOUNTER — Encounter: Payer: Self-pay | Admitting: Nurse Practitioner

## 2016-11-18 ENCOUNTER — Telehealth: Payer: Self-pay | Admitting: *Deleted

## 2016-11-18 NOTE — Telephone Encounter (Signed)
Telephone call to patient. She states she did not schedule an appt with [facility]. She will call them to reschedule. She had to go abruptly and states she will call back to discuss this  Called and made appt for CT Scans. [place] and requested a July appt for the scans. Appt made for July 6th at 9:30, NPO 4 hours prior and patient needs oral contrast with directions. Patient is not aware of appt or directions yet. Will call patient again to advise.

## 2016-11-18 NOTE — Telephone Encounter (Signed)
-----   Message from Owens Shark, NP sent at 11/17/2016  4:54 PM EDT ----- I had ordered CT scans to be done approximately 11/16/2016. Can you follow-up on this please? Also, I received notification from [facility] that she did not show for her appointment there.

## 2016-11-19 ENCOUNTER — Other Ambulatory Visit: Payer: Self-pay | Admitting: Family

## 2016-11-19 NOTE — Telephone Encounter (Signed)
Telephone call to patient- she confirms CT scan for July 6. Patient verbalized an understanding to pick up contrast prep at [facility] front desk. Verbally gave directions for contrast and advised patient written directions included in the bag. Pt understands  To call this office with any concerns or questions.

## 2016-11-25 ENCOUNTER — Ambulatory Visit (HOSPITAL_COMMUNITY): Payer: BLUE CROSS/BLUE SHIELD

## 2016-11-26 ENCOUNTER — Telehealth: Payer: Self-pay | Admitting: Family Medicine

## 2016-11-26 MED ORDER — SULFAMETHOXAZOLE-TRIMETHOPRIM 800-160 MG PO TABS: 1.0000 | ORAL_TABLET | Freq: Two times a day (BID) | ORAL | 0 refills | Status: DC

## 2016-11-26 NOTE — Telephone Encounter (Signed)
Rx for bactrim set up and pending. Couldn't find [facility] off [address] in [facility] so called pt to get the phone number for the correct [facility] but pt didn't answer. LMTCB.

## 2016-11-26 NOTE — Telephone Encounter (Signed)
Go ahead and call in Bactrim double strength [phone] the twice a day for 10days

## 2016-11-26 NOTE — Telephone Encounter (Signed)
Spoke with pt - [place] - she will have that [place] pull from [facility] files - rx was sent there\

## 2016-12-04 ENCOUNTER — Ambulatory Visit (HOSPITAL_COMMUNITY)
Admission: RE | Admit: 2016-12-04 | Discharge: 2016-12-04 | Disposition: A | Discharge status: Home / Self Care | Payer: BLUE CROSS/BLUE SHIELD | Source: Ambulatory Visit | Attending: Nurse Practitioner | Admitting: Nurse Practitioner

## 2016-12-04 DIAGNOSIS — C20 Malignant neoplasm of rectum: Secondary | ICD-10-CM | POA: Diagnosis present

## 2016-12-04 DIAGNOSIS — K449 Diaphragmatic hernia without obstruction or gangrene: Secondary | ICD-10-CM | POA: Insufficient documentation

## 2016-12-04 DIAGNOSIS — R933 Abnormal findings on diagnostic imaging of other parts of digestive tract: Secondary | ICD-10-CM | POA: Diagnosis not present

## 2016-12-04 DIAGNOSIS — K6389 Other specified diseases of intestine: Secondary | ICD-10-CM | POA: Diagnosis not present

## 2016-12-04 MED ORDER — IOPAMIDOL (ISOVUE-300) INJECTION 61%
INTRAVENOUS | Status: AC
  Filled 2016-12-04: qty 100

## 2016-12-04 MED ORDER — IOPAMIDOL (ISOVUE-300) INJECTION 61%
100.0000 mL | Freq: Once | INTRAVENOUS | Status: AC | PRN
  Administered 2016-12-04: 100 mL via INTRAVENOUS

## 2016-12-07 ENCOUNTER — Telehealth: Payer: Self-pay

## 2016-12-07 NOTE — Telephone Encounter (Signed)
-----   Message from Ladell Pier, MD sent at 12/05/2016  2:30 PM EDT ----- Please call patient, ct shows no evidence of cancer

## 2016-12-07 NOTE — Telephone Encounter (Signed)
Call placed to pt to inform her that her recent CT looked clear of CA per MD Sherrill. Pt asked about upcoming appts with outside provider. Informed pt to call and follow-up about appts with outside provider. Pt verbalized understanding and agreed with plan of care.

## 2016-12-11 NOTE — Anesthesia Postprocedure Evaluation (Signed)
Anesthesia Post Note  Patient: Destiny [name]  Procedure(s) Performed: Procedure(s) (LRB): ILEOSTOMY REVERSAL (N/A) ANAL EXAM UNDER ANESTHESIA (N/A)     Anesthesia Post Evaluation  Last Vitals:  Vitals:   09/09/16 2052 09/10/16 0543  BP: (!) 109/56 (!) 98/51  Pulse: 71 64  Resp: 18 18  Temp: 36.8 C 36.7 C    Last Pain:  Vitals:   09/10/16 0800  TempSrc:   PainSc: [place]

## 2016-12-11 NOTE — Addendum Note (Signed)
Addendum  created 12/11/16 1034 by Lyndle Herrlich, MD   Sign clinical note

## 2017-01-13 ENCOUNTER — Encounter: Payer: Self-pay | Admitting: Physician Assistant

## 2017-01-13 ENCOUNTER — Telehealth: Payer: Self-pay | Admitting: Physician Assistant

## 2017-01-13 ENCOUNTER — Ambulatory Visit (INDEPENDENT_AMBULATORY_CARE_PROVIDER_SITE_OTHER): Payer: BLUE CROSS/BLUE SHIELD | Admitting: Physician Assistant

## 2017-01-13 VITALS — BP 120/80 | HR 84 | Ht 61.5 in | Wt 215.8 lb

## 2017-01-13 DIAGNOSIS — R195 Other fecal abnormalities: Secondary | ICD-10-CM | POA: Diagnosis not present

## 2017-01-13 DIAGNOSIS — Z85048 Personal history of other malignant neoplasm of rectum, rectosigmoid junction, and anus: Secondary | ICD-10-CM | POA: Diagnosis not present

## 2017-01-13 MED ORDER — NA SULFATE-K SULFATE-MG SULF 17.5-3.13-1.6 GM/177ML PO SOLN: 1.0000 | ORAL | 0 refills | Status: DC

## 2017-01-13 NOTE — Progress Notes (Signed)
Chief Complaint: History of Rectal cancer  HPI:  Destiny [name] is a 51 year old Caucasian female with a past medical history as listed below including rectal invasive adenocarcinoma diagnosed via colonoscopy May 2017. CT scan of the chest and abdomen at that time showed no signs of metastatic disease. CEA was normal. MRI of the pelvis for staging purposes showed a T3 N1 tumor consistent with stage III disease. She completed chemotherapy and radiation neoadjuvantly. She underwent a low anterior resection in late September 2017 and ileostomy reversal in 09/06/2016.  She presents to clinic today as a referral from Ladell Pier, MD for follow-up of her colorectal cancer   Today, the patient tells me that she is doing fairly well. She explains that ever since they "removed most of my rectum", she has some urgent unpredictable stools. These are often loose, but still formed. Occasionally she will have fecal incontinence and multiple loose stools which make her "butt raw". Patient also tells me that she will have some rectal pain with bowel movements, but just had a rectal exam by the surgeon last week and everything was "normal". Patient also describes some pain in the area of her recent ileostomy reversal. This is thought related to scar tissue per her surgeon. Patient does use when necessary Imodium and is instructed to start fiber which she has not yet.   Patient denies fever, chills, blood in her stool, melena, weight loss, fatigue and anorexia, nausea, vomiting or symptoms that awaken her at night.   Past Medical History:  Diagnosis Date  . Cancer ([place]) 10/29/15   rectal invasice adenocarcinoma   . Chronic back pain   . Depression   . Hypertension   . Hyperthyroidism   . Rectal cancer ([place])    Adenocarcinoma dx 10/29/15; Stage III, T3, N1, MO    Past Surgical History:  Procedure Laterality Date  . ABDOMINAL HYSTERECTOMY     vaginal   . BLADDER SURGERY    . EYE SURGERY     LAZY EYE  CORRECTION  . ILEOSTOMY    . ILEOSTOMY CLOSURE N/A 09/02/2016   Procedure: ILEOSTOMY REVERSAL;  Surgeon: Leighton Ruff, MD;  Location: [facility];  Service: General;  Laterality: N/A;  . RECTAL EXAM UNDER ANESTHESIA N/A 09/02/2016   Procedure: ANAL EXAM UNDER ANESTHESIA;  Surgeon: Leighton Ruff, MD;  Location: [facility];  Service: General;  Laterality: N/A;  . URETERAL REIMPLANTION Left 02/26/2016   Procedure: left ureteral neocystotomy,double J stent placement, closure vaginal cuff;  Surgeon: Ardis Hughs, MD;  Location: [facility];  Service: Urology;  Laterality: Left;  [place] Kitchen VAGINAL HYSTERECTOMY    . XI ROBOTIC ASSISTED LOWER ANTERIOR RESECTION N/A 02/26/2016   Procedure: XI ROBOTIC ASSISTED LOWER ANTERIOR RESECTION, diverting LOOP ILEOSTOMY, mobilization splenic flexure, omentopexy;  Surgeon: Leighton Ruff, MD;  Location: [facility];  Service: General;  Laterality: N/A;    Current Outpatient Prescriptions  Medication Sig Dispense Refill  . acetaminophen (TYLENOL) 500 MG tablet Take 1,000 mg by mouth every 8 (eight) hours as needed for moderate pain.    . ferrous sulfate 325 (65 FE) MG tablet Take 325 mg by mouth daily with breakfast.    . gabapentin (NEURONTIN) 300 MG capsule 1 CAPSULE TODAY, TWICE A DAY FRIDAY, THEN 3 TIMES A DAY FOR 30 DAYS (Patient taking differently: TAKE 1 CAPSULE (300 MG) BY MOUTH 3 TIMES DAILY) 93 capsule 1  . levothyroxine (SYNTHROID, LEVOTHROID) 112 MCG tablet Take 1 tablet (112 mcg total) by mouth daily before breakfast.  90 tablet 3  . loperamide (IMODIUM) 2 MG capsule TAKE  (1)  CAPSULE THREE TIMES DAILY AS NEEDED FOR DIARRHEA. 30 capsule 5  . LORazepam (ATIVAN) 0.5 MG tablet TAKE  (1)  TABLET TWICE A DAY. (Patient taking differently: TAKE 1 TABLET (0.5 MG) BY MOUTH AT BEDTIME AS NEEDED FOR ANXIETY) 60 tablet 1  . methocarbamol (ROBAXIN) 500 MG tablet TAKE (2) TABLETS 4 TIMES A DAY AS NEEDED FOR MUSCLE RELAXATION. 60 tablet 1  . nystatin (NYSTATIN) powder Apply topically 4 (four)  times daily. 15 g 0  . ondansetron (ZOFRAN) 8 MG tablet Take 1 tablet (8 mg total) by mouth every 8 (eight) hours as needed for nausea or vomiting. 30 tablet 0  . sertraline (ZOLOFT) 100 MG tablet Take 1 tablet (100 mg total) by mouth daily. 30 tablet 5  . traZODone (DESYREL) 50 MG tablet Take 0.5-1 tablets (25-50 mg total) by mouth at bedtime as needed for sleep. 30 tablet 3  . vitamin C (ASCORBIC ACID) 500 MG tablet Take 500 mg by mouth daily.     No current facility-administered medications for this visit.     Allergies as of 01/13/2017 - Review Complete 01/13/2017  Allergen Reaction Noted  . Latex Itching and Rash 02/07/2016    Family History  Problem Relation Age of Onset  . Thyroid disease Mother   . Lung cancer Father        lung  . Cirrhosis Brother     Social History   Social History  . Marital status: Divorced    Spouse name: N/A  . Number of children: 3  . Years of education: N/A   Occupational History  . Not on file.   Social History Main Topics  . Smoking status: Never Smoker  . Smokeless tobacco: Never Used  . Alcohol use No     Comment: occ  . Drug use: No  . Sexual activity: Yes    Birth control/ protection: Surgical   Other Topics Concern  . Not on file   Social History Narrative  . No narrative on file    Review of Systems:    Constitutional: No weight loss, fever or chills Cardiovascular: No chest pain   Respiratory: No SOB  Gastrointestinal: See HPI and otherwise negative   Physical Exam:  Vital signs: BP 120/80   Pulse 84   Ht 5' 1.5" (1.562 m)   Wt 215 lb 12.8 oz (97.9 kg)   LMP  (LMP Unknown) Comment: FULL   BMI 40.11 kg/m   Constitutional:   Pleasant overweight Caucasian female appears to be in NAD, Well developed, Well nourished, alert and cooperative  Respiratory: Respirations even and unlabored. Lungs clear to auscultation bilaterally.   No wheezes, crackles, or rhonchi.  Cardiovascular: Normal S1, S2. No MRG. Regular rate  and rhythm. No peripheral edema, cyanosis or pallor.  Gastrointestinal:  Soft, nondistended,mild ttp over scar in RLQ, no hernia appreciated. No rebound or guarding. Normal bowel sounds. No appreciable masses or hepatomegaly. Rectal:  Not performed.  Psychiatric:  Demonstrates good judgement and reason without abnormal affect or behaviors.  MOST RECENT LABS AND IMAGING: CBC    Component Value Date/Time   WBC 5.3 10/27/2016 1309   WBC 4.4 10/21/2016 0530   RBC 3.90 10/27/2016 1309   RBC 4.15 10/21/2016 0530   HGB 10.7 (L) 10/27/2016 1309   HGB 11.8 08/06/2016 1055   HCT 32.2 (L) 10/27/2016 1309   HCT 33.5 (L) 08/06/2016 1055   PLT 403 (  H) 10/27/2016 1309   MCV 83 10/27/2016 1309   MCV 85.7 08/06/2016 1055   MCH 27.4 10/27/2016 1309   MCH 28.4 10/21/2016 0530   MCHC 33.2 10/27/2016 1309   MCHC 33.7 10/21/2016 0530   RDW 13.2 10/27/2016 1309   RDW 16.6 (H) 08/06/2016 1055   LYMPHSABS 0.7 10/27/2016 1309   LYMPHSABS 0.7 (L) 08/06/2016 1055   MONOABS 0.7 10/20/2016 2005   MONOABS 0.4 08/06/2016 1055   EOSABS 0.3 10/27/2016 1309   BASOSABS 0.0 10/27/2016 1309   BASOSABS 0.0 08/06/2016 1055    CMP     Component Value Date/Time   NA 143 10/27/2016 1309   NA 139 08/06/2016 1055   K 3.7 10/27/2016 1309   K 4.1 08/06/2016 1055   CL 103 10/27/2016 1309   CO2 24 10/27/2016 1309   CO2 25 08/06/2016 1055   GLUCOSE 128 (H) 10/27/2016 1309   GLUCOSE 279 (H) 10/21/2016 0530   GLUCOSE 125 08/06/2016 1055   BUN 9 10/27/2016 1309   BUN 11.4 08/06/2016 1055   CREATININE 0.71 10/27/2016 1309   CREATININE 0.7 08/06/2016 1055   CALCIUM 8.6 (L) 10/27/2016 1309   CALCIUM 9.2 08/06/2016 1055   PROT 7.9 10/20/2016 2005   PROT 7.3 08/06/2016 1055   ALBUMIN 3.9 10/20/2016 2005   ALBUMIN 3.7 08/06/2016 1055   AST 14 (L) 10/20/2016 2005   AST 15 08/06/2016 1055   ALT 10 (L) 10/20/2016 2005   ALT 12 08/06/2016 1055   ALKPHOS 77 10/20/2016 2005   ALKPHOS 123 08/06/2016 1055   BILITOT 1.0  10/20/2016 2005   BILITOT 0.44 08/06/2016 1055   GFRNONAA 99 10/27/2016 1309   GFRAA 114 10/27/2016 1309    Assessment: 1. History of rectal cancer: Diagnosed in early 2017, chemotherapy and radiation neoadjuvantly, status post resection late September 2017 and reversal of ileostomy in April of this year 2. Loose stools: Ever since surgery above, managed with when necessary Imodium  Plan: 1. Patient was scheduled for colonoscopy with Dr. Havery Moros in the [facility]. Did discuss risks, benefits, limitations and alternatives the patient agrees to proceed. 2. In regards to patient's rectal pain at times, this can be better observed during upcoming colonoscopy as recent rectal exam was negative by the surgical team. Question possible fissure. Discussed this with the patient. 3. Recommend patient increase fiber in her diet to at least 25-35 g per day with the use of fiber supplements such as Citrucel. 4. Continue when necessary Imodium use 5. Patient will return to clinic per recommendations from Dr. Havery Moros after time of upcoming procedure.  Ellouise Newer, PA-C [facility] 01/13/2017, 1:45 PM  Cc: Ladell Pier, MD

## 2017-01-13 NOTE — Progress Notes (Signed)
Agree with assessment and plan as outlined.  

## 2017-01-13 NOTE — Telephone Encounter (Signed)
Spoke to patient and sent coupon to pharmacy. She verbalized understanding.

## 2017-01-13 NOTE — Patient Instructions (Signed)
You have been scheduled for a colonoscopy. Please follow written instructions given to you at your visit today.  Please pick up your prep supplies at the pharmacy within the next 1-3 days. If you use inhalers (even only as needed), please bring them with you on the day of your procedure. Your physician has requested that you go to www.startemmi.com and enter the access code given to you at your visit today. This web site gives a general overview about your procedure. However, you should still follow specific instructions given to you by our office regarding your preparation for the procedure.  We have given you a high fiber diet handout. Please strive to have 25-30 grams of fiber daily.   

## 2017-01-18 ENCOUNTER — Ambulatory Visit (AMBULATORY_SURGERY_CENTER): Payer: BLUE CROSS/BLUE SHIELD | Admitting: Gastroenterology

## 2017-01-18 ENCOUNTER — Encounter: Payer: Self-pay | Admitting: Gastroenterology

## 2017-01-18 VITALS — BP 129/78 | HR 70 | Temp 98.2°F | Resp 11 | Ht 61.5 in | Wt 215.0 lb

## 2017-01-18 DIAGNOSIS — Z85048 Personal history of other malignant neoplasm of rectum, rectosigmoid junction, and anus: Secondary | ICD-10-CM

## 2017-01-18 DIAGNOSIS — R197 Diarrhea, unspecified: Secondary | ICD-10-CM

## 2017-01-18 MED ORDER — SODIUM CHLORIDE 0.9 % IV SOLN: 500.0000 mL | INTRAVENOUS | Status: DC

## 2017-01-18 NOTE — Progress Notes (Signed)
Pt. Reports no change in her medical or surgical history since pre-visit 01/13/2017.

## 2017-01-18 NOTE — Patient Instructions (Signed)
Biopsies taken today. Result letter in your mail in 2-3 weeks. Repeat colonoscopy in 3 years. Call us with any questions or concerns. Thank you!   YOU HAD AN ENDOSCOPIC PROCEDURE TODAY AT [facility]:   Refer to the procedure report that was given to you for any specific questions about what was found during the examination.  If the procedure report does not answer your questions, please call your gastroenterologist to clarify.  If you requested that your care partner not be given the details of your procedure findings, then the procedure report has been included in a sealed envelope for you to review at your convenience later.  YOU SHOULD EXPECT: Some feelings of bloating in the abdomen. Passage of more gas than usual.  Walking can help get rid of the air that was put into your GI tract during the procedure and reduce the bloating. If you had a lower endoscopy (such as a colonoscopy or flexible sigmoidoscopy) you may notice spotting of blood in your stool or on the toilet paper. If you underwent a bowel prep for your procedure, you may not have a normal bowel movement for a few days.  Please Note:  You might notice some irritation and congestion in your nose or some drainage.  This is from the oxygen used during your procedure.  There is no need for concern and it should clear up in a day or so.  SYMPTOMS TO REPORT IMMEDIATELY:   Following lower endoscopy (colonoscopy or flexible sigmoidoscopy):  Excessive amounts of blood in the stool  Significant tenderness or worsening of abdominal pains  Swelling of the abdomen that is new, acute  Fever of 100F or higher  For urgent or emergent issues, a gastroenterologist can be reached at any hour by calling [phone].   DIET:  We do recommend a small meal at first, but then you may proceed to your regular diet.  Drink plenty of fluids but you should avoid alcoholic beverages for 24 hours.  ACTIVITY:  You should plan to take it  easy for the rest of today and you should NOT DRIVE or use heavy machinery until tomorrow (because of the sedation medicines used during the test).    FOLLOW UP: Our staff will call the number listed on your records the next business day following your procedure to check on you and address any questions or concerns that you may have regarding the information given to you following your procedure. If we do not reach you, we will leave a message.  However, if you are feeling well and you are not experiencing any problems, there is no need to return our call.  We will assume that you have returned to your regular daily activities without incident.  If any biopsies were taken you will be contacted by phone or by letter within the next 1-3 weeks.  Please call us at [phone] if you have not heard about the biopsies in 3 weeks.    SIGNATURES/CONFIDENTIALITY: You and/or your care partner have signed paperwork which will be entered into your electronic medical record.  These signatures attest to the fact that that the information above on your After Visit Summary has been reviewed and is understood.  Full responsibility of the confidentiality of this discharge information lies with you and/or your care-partner.

## 2017-01-18 NOTE — Progress Notes (Signed)
To recovery, report to RN, VSS. 

## 2017-01-18 NOTE — Op Note (Signed)
[facility] Patient Name: Destiny [name] Procedure Date: 01/18/2017 11:10 AM MRN: [id number] Endoscopist: Remo Lipps P. Armbruster MD, MD Age: 51 Referring MD:  Date of Birth: Feb 18, 1966 Gender: Female Account #: [id number] Procedure:                Colonoscopy Indications:              High risk colon cancer surveillance: Personal                            history of colon cancer one year ago (rectal                            cancer) treated with chemotherapy / XRT, surgery,                            here for surveillance exam Medicines:                Monitored Anesthesia Care Procedure:                Pre-Anesthesia Assessment:                           - Prior to the procedure, a History and Physical                            was performed, and patient medications and                            allergies were reviewed. The patient's tolerance of                            previous anesthesia was also reviewed. The risks                            and benefits of the procedure and the sedation                            options and risks were discussed with the patient.                            All questions were answered, and informed consent                            was obtained. Prior Anticoagulants: The patient has                            taken no previous anticoagulant or antiplatelet                            agents. ASA Grade Assessment: III - A patient with                            severe systemic disease. After reviewing the risks  and benefits, the patient was deemed in                            satisfactory condition to undergo the procedure.                           After obtaining informed consent, the colonoscope                            was passed under direct vision. Throughout the                            procedure, the patient's blood pressure, pulse, and                            oxygen saturations were monitored  continuously. The                            Model CF-HQ190L [id number]) scope was introduced                            through the anus and advanced to the the cecum,                            identified by appendiceal orifice and ileocecal                            valve. The colonoscopy was performed without                            difficulty. The patient tolerated the procedure                            well. The quality of the bowel preparation was                            adequate. The ileocecal valve, appendiceal orifice,                            and rectum were photographed. Scope In: 11:14:21 AM Scope Out: 11:24:39 AM Scope Withdrawal Time: 0 hours 8 minutes 20 seconds  Total Procedure Duration: 0 hours 10 minutes 18 seconds  Findings:                 The digital rectal exam findings include perianal                            skin changes due to radiation. No fissure                            appreciated.                           There was evidence of a colonic anastomosis in the  distal rectum / anal canal. This was patent and was                            characterized by a visible staple.                           The exam was otherwise without abnormality. No                            polyps. No inflammatory changes. Retroflexed views                            not possible in the rectum due to post-surgical                            changes.                           Biopsies for histology were taken with a cold                            forceps from the right colon and left colon for                            evaluation of microscopic colitis. Complications:            No immediate complications. Estimated blood loss:                            Minimal. Estimated Blood Loss:     Estimated blood loss was minimal. Impression:               - Perianal skin changes due to radiation found on                            digital  rectal exam.                           - Patent end-to-end colo-colonic anastomosis,                            characterized by a visble staple .                           - The examination was otherwise normal.                           - Biopsies were taken with a cold forceps from the                            right colon and left colon for evaluation of                            microscopic colitis given ongoing diarrhea which I  suspect is functional +/- post-surgical (had been                            present pre-surgery). Recommendation:           - Patient has a contact number available for                            emergencies. The signs and symptoms of potential                            delayed complications were discussed with the                            patient. Return to normal activities tomorrow.                            Written discharge instructions were provided to the                            patient.                           - Resume previous diet.                           - Continue present medications.                           - Await pathology results.                           - Repeat colonoscopy in 3 years for surveillance. Remo Lipps P. Armbruster MD, MD 01/18/2017 11:32:07 AM This report has been signed electronically.

## 2017-01-18 NOTE — Progress Notes (Signed)
Called to room to assist during endoscopic procedure.  Patient ID and intended procedure confirmed with present staff. Received instructions for my participation in the procedure from the performing physician.  

## 2017-01-19 ENCOUNTER — Telehealth: Payer: Self-pay | Admitting: *Deleted

## 2017-01-19 NOTE — Telephone Encounter (Signed)
  Follow up Call-  Call back number 01/18/2017 10/29/2015  Post procedure Call Back phone  # [phone]  Permission to leave phone message Yes Yes  Some recent data might be hidden     Patient questions:  Do you have a fever, pain , or abdominal swelling? No. Pain Score  0 *  Have you tolerated food without any problems? Yes.    Have you been able to return to your normal activities? Yes.    Do you have any questions about your discharge instructions: Diet   No. Medications  No. Follow up visit  No.  Do you have questions or concerns about your Care? No.  Actions: * If pain score is 4 or above: No action needed, pain <4.

## 2017-01-22 ENCOUNTER — Encounter: Payer: Self-pay | Admitting: Gastroenterology

## 2017-02-11 ENCOUNTER — Other Ambulatory Visit: Payer: Self-pay | Admitting: Family Medicine

## 2017-02-11 DIAGNOSIS — L2489 Irritant contact dermatitis due to other agents: Secondary | ICD-10-CM

## 2017-02-15 ENCOUNTER — Other Ambulatory Visit: Payer: Self-pay | Admitting: Family Medicine

## 2017-02-16 NOTE — Telephone Encounter (Signed)
Last seen 11/06/16  Dr Dettinger

## 2017-03-16 ENCOUNTER — Other Ambulatory Visit: Payer: Self-pay | Admitting: Family Medicine

## 2017-03-16 DIAGNOSIS — L2489 Irritant contact dermatitis due to other agents: Secondary | ICD-10-CM

## 2017-04-07 ENCOUNTER — Other Ambulatory Visit: Payer: Self-pay | Admitting: Family Medicine

## 2017-04-09 ENCOUNTER — Encounter: Payer: Self-pay | Admitting: Family Medicine

## 2017-04-09 ENCOUNTER — Ambulatory Visit: Payer: BLUE CROSS/BLUE SHIELD | Admitting: Family Medicine

## 2017-04-09 VITALS — BP 129/80 | HR 111 | Temp 100.9°F | Ht 61.5 in | Wt 227.0 lb

## 2017-04-09 DIAGNOSIS — I10 Essential (primary) hypertension: Secondary | ICD-10-CM | POA: Diagnosis not present

## 2017-04-09 DIAGNOSIS — E890 Postprocedural hypothyroidism: Secondary | ICD-10-CM

## 2017-04-09 DIAGNOSIS — R52 Pain, unspecified: Secondary | ICD-10-CM

## 2017-04-09 MED ORDER — SULFAMETHOXAZOLE-TRIMETHOPRIM 800-160 MG PO TABS: ORAL_TABLET | ORAL | 0 refills | Status: DC

## 2017-04-09 MED ORDER — LEVOTHYROXINE SODIUM 112 MCG PO TABS: 112.0000 ug | ORAL_TABLET | Freq: Every day | ORAL | 3 refills | Status: DC

## 2017-04-09 MED ORDER — HYDROCHLOROTHIAZIDE 25 MG PO TABS: 25.0000 mg | ORAL_TABLET | Freq: Every day | ORAL | 2 refills | Status: DC

## 2017-04-09 NOTE — Progress Notes (Signed)
BP 129/80   Pulse (!) 111   Temp (!) 100.9 F (38.3 C) (Oral)   Ht 5' 1.5" (1.562 m)   Wt 227 lb (103 kg)   LMP  (LMP Unknown) Comment: FULL   BMI 42.20 kg/m    Subjective:    Patient ID: Destiny [name], female    DOB: 1966/04/06, 51 y.o.   MRN: [id number]  HPI: Destiny [name] is a 51 y.o. female presenting on 04/09/2017 for Hypothyroidism and Chills, body aches (began today)   HPI Hypothyroidism recheck Patient is coming in for thyroid recheck today as well. They deny any issues with hair changes or heat or cold problems or diarrhea or constipation. They deny any chest pain or palpitations. They are currently on levothyroxine 160mcrograms   Hypertension Patient is currently on hydrochlorothiazide, and their blood pressure today is 129/80. Patient denies any lightheadedness or dizziness. Patient denies headaches, blurred vision, chest pains, shortness of breath, or weakness. Denies any side effects from medication and is content with current medication.   Body aches Patient comes in today complaining of chills that just started this morning.  She denies any cough or fever runny nose.  She has been having still looser bowel movements since she had her reanastomosis after colon cancer surgery.  Because of that she gets very raw and red in her buttock area and has to take Bactrim for that and she thinks the chills could be correlated to that but also wants to be checked for influenza today.  She denies any shortness of breath or wheezing.  She does have some right lower abdominal pain near the incision site which is healed but the scar tissue is where she has the pain.  Relevant past medical, surgical, family and social history reviewed and updated as indicated. Interim medical history since our last visit reviewed. Allergies and medications reviewed and updated.  Review of Systems  Constitutional: Positive for chills. Negative for fever.  HENT: Negative for congestion, ear discharge and  ear pain.   Eyes: Negative for redness and visual disturbance.  Respiratory: Negative for chest tightness and shortness of breath.   Cardiovascular: Negative for chest pain and leg swelling.  Gastrointestinal: Positive for abdominal pain and diarrhea.  Genitourinary: Negative for difficulty urinating, dysuria, frequency and urgency.  Musculoskeletal: Negative for back pain and gait problem.  Skin: Positive for color change. Negative for rash.  Neurological: Negative for light-headedness and headaches.  Psychiatric/Behavioral: Negative for agitation and behavioral problems.  All other systems reviewed and are negative.   Per HPI unless specifically indicated above        Objective:    BP 129/80   Pulse (!) 111   Temp (!) 100.9 F (38.3 C) (Oral)   Ht 5' 1.5" (1.562 m)   Wt 227 lb (103 kg)   LMP  (LMP Unknown) Comment: FULL   BMI 42.20 kg/m   Wt Readings from Last 3 Encounters:  04/09/17 227 lb (103 kg)  01/18/17 215 lb (97.5 kg)  01/13/17 215 lb 12.8 oz (97.9 kg)    Physical Exam  Constitutional: She is oriented to person, place, and time. She appears well-developed and well-nourished. No distress.  HENT:  Right Ear: External ear normal.  Left Ear: External ear normal.  Nose: Nose normal.  Mouth/Throat: Oropharynx is clear and moist. No oropharyngeal exudate.  Eyes: Conjunctivae are normal.  Neck: Neck supple. No thyromegaly present.  Cardiovascular: Normal rate, regular rhythm, normal heart sounds and intact distal pulses.  No murmur heard. Pulmonary/Chest: Effort normal and breath sounds normal. No respiratory distress. She has no wheezes. She has no rales.  Abdominal: Soft. Bowel sounds are normal. She exhibits no distension. There is tenderness (Right lower quadrant tenderness over right lower quadrant scar where her ostomy was before). There is no rebound and no guarding.  Musculoskeletal: Normal range of motion.  Lymphadenopathy:    She has no cervical  adenopathy.  Neurological: She is alert and oriented to person, place, and time. Coordination normal.  Skin: Skin is warm and dry. No rash noted. She is not diaphoretic.  Patient did not want to show me the rash as it is in the private area but says the Bactrim works for it every time she gets it  Psychiatric: She has a normal mood and affect. Her behavior is normal.  Nursing note and vitals reviewed.   Influenza: Negative    Assessment & Plan:   Problem List Items Addressed This Visit      Cardiovascular and Mediastinum   Essential hypertension, benign   Relevant Medications   hydrochlorothiazide (HYDRODIURIL) 25 MG tablet   Other Relevant Orders   CMP14+EGFR     Endocrine   Hypothyroidism   Relevant Medications   levothyroxine (SYNTHROID, LEVOTHROID) 112 MCG tablet   Other Relevant Orders   TSH    Other Visit Diagnoses    Body aches    -  Primary   Relevant Medications   sulfamethoxazole-trimethoprim (BACTRIM DS,SEPTRA DS) 800-160 MG tablet   Other Relevant Orders   Veritor Flu A/B Waived       Follow up plan: Return in about 3 months (around 07/10/2017), or if symptoms worsen or fail to improve.  Counseling provided for all of the vaccine components Orders Placed This Encounter  Procedures  . Veritor Flu A/B Waived  . TSH    Caryl Pina, MD [facility] 04/09/2017, 3:53 PM

## 2017-04-10 LAB — CMP14+EGFR
ALK PHOS: 88 IU/L (ref 39–117)
ALT: 11 IU/L (ref 0–32)
AST: 16 IU/L (ref 0–40)
Albumin/Globulin Ratio: 1.4 (ref 1.2–2.2)
Albumin: 4.3 g/dL (ref 3.5–5.5)
BILIRUBIN TOTAL: 0.4 mg/dL (ref 0.0–1.2)
BUN/Creatinine Ratio: 14 (ref 9–23)
BUN: 16 mg/dL (ref 6–24)
CHLORIDE: 95 mmol/L — AB (ref 96–106)
CO2: 26 mmol/L (ref 20–29)
CREATININE: 1.12 mg/dL — AB (ref 0.57–1.00)
Calcium: 9.2 mg/dL (ref 8.7–10.2)
GFR calc Af Amer: 66 mL/min/{1.73_m2} (ref >59)
GFR calc non Af Amer: 57 mL/min/{1.73_m2} — ABNORMAL LOW (ref >59)
GLUCOSE: 120 mg/dL — AB (ref 65–99)
Globulin, Total: 3.1 g/dL (ref 1.5–4.5)
Potassium: 3.8 mmol/L (ref 3.5–5.2)
Sodium: 137 mmol/L (ref 134–144)
TOTAL PROTEIN: 7.4 g/dL (ref 6.0–8.5)

## 2017-04-10 LAB — TSH: TSH: 2.02 u[IU]/mL (ref 0.450–4.500)

## 2017-04-12 ENCOUNTER — Other Ambulatory Visit: Payer: Self-pay

## 2017-04-12 ENCOUNTER — Telehealth: Payer: Self-pay | Admitting: Family Medicine

## 2017-04-12 ENCOUNTER — Emergency Department (HOSPITAL_COMMUNITY)
Admission: EM | Admit: 2017-04-12 | Discharge: 2017-04-12 | Disposition: A | Discharge status: Home / Self Care | Payer: BLUE CROSS/BLUE SHIELD | Attending: Emergency Medicine | Admitting: Emergency Medicine

## 2017-04-12 ENCOUNTER — Encounter (HOSPITAL_COMMUNITY): Payer: Self-pay | Admitting: *Deleted

## 2017-04-12 ENCOUNTER — Emergency Department (HOSPITAL_COMMUNITY): Payer: BLUE CROSS/BLUE SHIELD

## 2017-04-12 DIAGNOSIS — R509 Fever, unspecified: Secondary | ICD-10-CM | POA: Diagnosis present

## 2017-04-12 DIAGNOSIS — Z79899 Other long term (current) drug therapy: Secondary | ICD-10-CM | POA: Insufficient documentation

## 2017-04-12 DIAGNOSIS — E039 Hypothyroidism, unspecified: Secondary | ICD-10-CM | POA: Diagnosis not present

## 2017-04-12 DIAGNOSIS — I10 Essential (primary) hypertension: Secondary | ICD-10-CM | POA: Diagnosis not present

## 2017-04-12 DIAGNOSIS — R319 Hematuria, unspecified: Secondary | ICD-10-CM | POA: Insufficient documentation

## 2017-04-12 DIAGNOSIS — N390 Urinary tract infection, site not specified: Secondary | ICD-10-CM | POA: Diagnosis not present

## 2017-04-12 DIAGNOSIS — C20 Malignant neoplasm of rectum: Secondary | ICD-10-CM | POA: Insufficient documentation

## 2017-04-12 LAB — URINALYSIS, ROUTINE W REFLEX MICROSCOPIC
BILIRUBIN URINE: NEGATIVE
GLUCOSE, UA: NEGATIVE mg/dL
Ketones, ur: NEGATIVE mg/dL
NITRITE: NEGATIVE
Protein, ur: 100 mg/dL — AB
SPECIFIC GRAVITY, URINE: 1.023 (ref 1.005–1.030)
pH: 6 (ref 5.0–8.0)

## 2017-04-12 LAB — CBC WITH DIFFERENTIAL/PLATELET
BASOS PCT: 0 %
Basophils Absolute: 0 10*3/uL (ref 0.0–0.1)
EOS ABS: 0.1 10*3/uL (ref 0.0–0.7)
EOS PCT: 2 %
HCT: 31.4 % — ABNORMAL LOW (ref 36.0–46.0)
HEMOGLOBIN: 10.5 g/dL — AB (ref 12.0–15.0)
LYMPHS ABS: 0.7 10*3/uL (ref 0.7–4.0)
Lymphocytes Relative: 11 %
MCH: 28.5 pg (ref 26.0–34.0)
MCHC: 33.4 g/dL (ref 30.0–36.0)
MCV: 85.3 fL (ref 78.0–100.0)
MONO ABS: 0.7 10*3/uL (ref 0.1–1.0)
MONOS PCT: 11 %
Neutro Abs: 4.9 10*3/uL (ref 1.7–7.7)
Neutrophils Relative %: 76 %
Platelets: 243 10*3/uL (ref 150–400)
RBC: 3.68 MIL/uL — ABNORMAL LOW (ref 3.87–5.11)
RDW: 12.9 % (ref 11.5–15.5)
WBC: 6.4 10*3/uL (ref 4.0–10.5)

## 2017-04-12 LAB — COMPREHENSIVE METABOLIC PANEL
ALBUMIN: 3.4 g/dL — AB (ref 3.5–5.0)
ALK PHOS: 71 U/L (ref 38–126)
ALT: 22 U/L (ref 14–54)
ANION GAP: 7 (ref 5–15)
AST: 28 U/L (ref 15–41)
BUN: 16 mg/dL (ref 6–20)
CALCIUM: 8.5 mg/dL — AB (ref 8.9–10.3)
CO2: 27 mmol/L (ref 22–32)
Chloride: 99 mmol/L — ABNORMAL LOW (ref 101–111)
Creatinine, Ser: 0.92 mg/dL (ref 0.44–1.00)
GFR calc non Af Amer: >60 mL/min (ref >60)
GLUCOSE: 136 mg/dL — AB (ref 65–99)
POTASSIUM: 3.9 mmol/L (ref 3.5–5.1)
SODIUM: 133 mmol/L — AB (ref 135–145)
Total Bilirubin: 0.4 mg/dL (ref 0.3–1.2)
Total Protein: 7.4 g/dL (ref 6.5–8.1)

## 2017-04-12 LAB — RAPID STREP SCREEN (MED CTR MEBANE ONLY): Streptococcus, Group A Screen (Direct): NEGATIVE

## 2017-04-12 MED ORDER — CEPHALEXIN 500 MG PO CAPS: 500.0000 mg | ORAL_CAPSULE | Freq: Four times a day (QID) | ORAL | 0 refills | Status: DC

## 2017-04-12 MED ORDER — IBUPROFEN 400 MG PO TABS
400.0000 mg | ORAL_TABLET | Freq: Once | ORAL | Status: AC
  Administered 2017-04-12: 400 mg via ORAL

## 2017-04-12 MED ORDER — IBUPROFEN 400 MG PO TABS
ORAL_TABLET | ORAL | Status: AC
  Filled 2017-04-12: qty 1

## 2017-04-12 NOTE — ED Triage Notes (Signed)
Pt c/o body aches, headache that started Friday, was seen by pcp on Friday, tested negative for flu, was given a prescription for bactrim, pt states that she has continued to have the fever and started having n/v today,

## 2017-04-12 NOTE — Telephone Encounter (Signed)
Patient aware of labs.  

## 2017-04-12 NOTE — Discharge Instructions (Signed)
Take the antibiotic for your urinary tract infection.  Follow-up with your doctor.  Return to the ED if you develop worsening pain, fever, vomiting or any other concerns.

## 2017-04-12 NOTE — ED Provider Notes (Signed)
[facility] EMERGENCY DEPARTMENT Provider Note   CSN: [id number] Arrival date & time: 04/12/17  0016     History   Chief Complaint Chief Complaint  Patient presents with  . Fever    HPI Destiny [name] is a 51 y.o. female.  Patient presents with a 2-day history of body aches, headache, fever up to 103 and chills.  She was seen by her PCP 2 days ago and tested negative for flu.  She was given Bactrim at her request for a "skin breakdown" by her buttocks.  He comes in tonight because the fevers are persistent despite taking ibuprofen and Tylenol at home.  Last dose of Tylenol was at 8 PM.  Has had a poor appetite.  One episode of vomiting yesterday.  No diarrhea.  No abdominal pain.  No chest pain or shortness of breath.  Denies any cough, sore throat or runny nose.  Headache and body aches chills, diaphoresis and fever.  Denies sick contacts at home.  Did not receive a flu shot.  History of rectal cancer in remission.   The history is provided by the patient.  Fever   Associated symptoms include vomiting, headaches and cough. Pertinent negatives include no chest pain, no congestion and no sore throat.    Past Medical History:  Diagnosis Date  . Cancer ([place]) 10/29/15   rectal invasice adenocarcinoma   . Chronic back pain   . Depression   . Hypertension   . Hyperthyroidism   . Rectal cancer ([place])    Adenocarcinoma dx 10/29/15; Stage III, T3, N1, MO    Patient Active Problem List   Diagnosis Date Noted  . Hypothyroidism 08/24/2016  . Rectal cancer ([place]) 02/26/2016  . Malignant neoplasm of rectum ([place]) 11/17/2015  . Anemia 10/01/2015  . Essential hypertension, benign 09/02/2015  . Obesity 09/02/2015  . Depression 05/02/2015    Past Surgical History:  Procedure Laterality Date  . ABDOMINAL HYSTERECTOMY     vaginal   . BLADDER SURGERY    . EYE SURGERY     LAZY EYE CORRECTION  . ILEOSTOMY    . VAGINAL HYSTERECTOMY      OB History    No data available       Home  Medications    Prior to Admission medications   Medication Sig Start Date End Date Taking? Authorizing Provider  acetaminophen (TYLENOL) 500 MG tablet Take 1,000 mg by mouth every 8 (eight) hours as needed for moderate pain.    [provider]  ferrous sulfate 325 (65 FE) MG tablet Take 325 mg by mouth daily with breakfast.    [provider]  gabapentin (NEURONTIN) 300 MG capsule 1 CAPSULE TODAY, TWICE A DAY FRIDAY, THEN 3 TIMES A DAY FOR 30 DAYS Patient taking differently: TAKE 1 CAPSULE (300 MG) BY MOUTH 3 TIMES DAILY 05/13/16   Dettinger, Fransisca Kaufmann, MD  hydrochlorothiazide (HYDRODIURIL) 25 MG tablet Take 1 tablet (25 mg total) daily by mouth. 04/09/17   Dettinger, Fransisca Kaufmann, MD  levothyroxine (SYNTHROID, LEVOTHROID) 112 MCG tablet Take 1 tablet (112 mcg total) daily before breakfast by mouth. 04/09/17   Dettinger, Fransisca Kaufmann, MD  LORazepam (ATIVAN) 0.5 MG tablet TAKE  (1)  TABLET TWICE A DAY. Patient taking differently: TAKE 1 TABLET (0.5 MG) BY MOUTH AT BEDTIME AS NEEDED FOR ANXIETY 08/26/15   Dettinger, Fransisca Kaufmann, MD  nystatin (MYCOSTATIN/NYSTOP) powder Apply topically 4 (four) times daily. 03/17/17   Dettinger, Fransisca Kaufmann, MD  sertraline (ZOLOFT) 100 MG  tablet Take 1 tablet (100 mg total) by mouth daily. 03/26/16   Evelina Dun A, FNP  sulfamethoxazole-trimethoprim (BACTRIM DS,SEPTRA DS) 800-160 MG tablet TAKE  (1)  TABLET TWICE A DAY. 04/09/17   Dettinger, Fransisca Kaufmann, MD  vitamin C (ASCORBIC ACID) 500 MG tablet Take 500 mg by mouth daily.    [provider]    Family History Family History  Problem Relation Age of Onset  . Thyroid disease Mother   . Lung cancer Father        lung  . Cirrhosis Brother     Social History Social History   Tobacco Use  . Smoking status: Never Smoker  . Smokeless tobacco: Never Used  Substance Use Topics  . Alcohol use: No    Alcohol/week: 0.0 oz    Comment: occ  . Drug use: No     Allergies   Latex   Review of  Systems Review of Systems  Constitutional: Positive for activity change, appetite change, chills, diaphoresis, fatigue and fever.  HENT: Positive for rhinorrhea. Negative for congestion and sore throat.   Respiratory: Positive for cough.   Cardiovascular: Negative for chest pain.  Gastrointestinal: Positive for nausea and vomiting. Negative for abdominal pain.  Genitourinary: Negative for dysuria and urgency.  Musculoskeletal: Positive for myalgias. Negative for arthralgias.  Skin: Negative for rash.  Neurological: Positive for weakness and headaches.   all other systems are negative except as noted in the HPI and PMH.     Physical Exam Updated Vital Signs BP (!) 159/88   Pulse 98   Temp 100 F (37.8 C) (Oral)   Resp 20   Ht 5\' 1"  (1.549 m)   Wt 103 kg (227 lb)   LMP  (LMP Unknown) Comment: FULL   SpO2 100%   BMI 42.89 kg/m   Physical Exam  Constitutional: She is oriented to person, place, and time. She appears well-developed and well-nourished. No distress.  HENT:  Head: Normocephalic and atraumatic.  Mouth/Throat: Oropharynx is clear and moist. No oropharyngeal exudate.  Moist mucus membranes  Eyes: Conjunctivae and EOM are normal. Pupils are equal, round, and reactive to light.  Neck: Normal range of motion. Neck supple.  No meningismus.  Cardiovascular: Normal rate, regular rhythm, normal heart sounds and intact distal pulses.  No murmur heard. Pulmonary/Chest: Effort normal and breath sounds normal. No respiratory distress. She exhibits no tenderness.  Abdominal: Soft. There is no tenderness. There is no rebound and no guarding.  Well healed previous ileostomy site  Musculoskeletal: Normal range of motion. She exhibits no edema or tenderness.  No CVAT  Neurological: She is alert and oriented to person, place, and time. No cranial nerve deficit. She exhibits normal muscle tone. Coordination normal.   5/5 strength throughout. CN 2-12 intact.Equal grip strength.    Skin: Skin is warm.  Psychiatric: She has a normal mood and affect. Her behavior is normal.  Nursing note and vitals reviewed.    ED Treatments / Results  Labs (all labs ordered are listed, but only abnormal results are displayed) Labs Reviewed  CBC WITH DIFFERENTIAL/PLATELET - Abnormal; Notable for the following components:      Result Value   RBC 3.68 (*)    Hemoglobin 10.5 (*)    HCT 31.4 (*)    All other components within normal limits  COMPREHENSIVE METABOLIC PANEL - Abnormal; Notable for the following components:   Sodium 133 (*)    Chloride 99 (*)    Glucose, Bld 136 (*)  Calcium 8.5 (*)    Albumin 3.4 (*)    All other components within normal limits  URINALYSIS, ROUTINE W REFLEX MICROSCOPIC - Abnormal; Notable for the following components:   APPearance HAZY (*)    Hgb urine dipstick LARGE (*)    Protein, ur 100 (*)    Leukocytes, UA SMALL (*)    Bacteria, UA RARE (*)    Squamous Epithelial / LPF 0-5 (*)    All other components within normal limits  RAPID STREP SCREEN (NOT AT [facility])  CULTURE, GROUP A STREP [facility])  URINE CULTURE    EKG  EKG Interpretation None       Radiology Dg Chest 2 View  Result Date: 04/12/2017 CLINICAL DATA:  51 year old female with fever. History of rectal cancer. EXAM: CHEST  2 VIEW COMPARISON:  Chest CT dated 12/04/2016 FINDINGS: The lungs are clear. There is no pleural effusion or pneumothorax. The cardiac silhouette is within normal limits. No acute osseous pathology. IMPRESSION: No active cardiopulmonary disease. Electronically Signed   By: Anner Crete M.D.   On: 04/12/2017 01:41    Procedures Procedures (including critical care time)  Medications Ordered in ED Medications  ibuprofen (ADVIL,MOTRIN) tablet 400 mg (not administered)     Initial Impression / Assessment and Plan / ED Course  I have reviewed the triage vital signs and the nursing notes.  Pertinent labs & imaging results that were available during my  care of the patient were reviewed by me and considered in my medical decision making (see chart for details).    Patient with a 2-day history of fever, body aches, headache, chills with recent negative flu test.  Patient appears well and nontoxic.  No meningismus.  Moist mucous membranes. Abdomen is soft and nontender. Urinalysis is concerning for infection and culture was sent.  Chest x-ray is negative Labs with stable anemia.  Low suspicion for intraabdominal pathology. Abdomen is soft and nontender. No CVAT.  Patient's vitals are stable in the ED.  She is tolerating p.o. and not vomiting.  She will be treated for possible urinary tract infection.  We will switch her Bactrim to Keflex in accordance with resistance patterns.  Follow-up with PCP.  Return precautions discussed.  Final Clinical Impressions(s) / ED Diagnoses   Final diagnoses:  Urinary tract infection with hematuria, site unspecified    ED Discharge Orders    None       Julaine Zimny, Annie Main, MD 04/12/17 [phone]

## 2017-04-13 LAB — URINE CULTURE

## 2017-04-14 LAB — CULTURE, GROUP A STREP (THRC)

## 2017-04-15 ENCOUNTER — Ambulatory Visit: Payer: BLUE CROSS/BLUE SHIELD

## 2017-05-07 ENCOUNTER — Other Ambulatory Visit (HOSPITAL_BASED_OUTPATIENT_CLINIC_OR_DEPARTMENT_OTHER): Payer: BLUE CROSS/BLUE SHIELD

## 2017-05-07 ENCOUNTER — Telehealth: Payer: Self-pay | Admitting: Oncology

## 2017-05-07 ENCOUNTER — Ambulatory Visit (HOSPITAL_BASED_OUTPATIENT_CLINIC_OR_DEPARTMENT_OTHER): Payer: BLUE CROSS/BLUE SHIELD | Admitting: Oncology

## 2017-05-07 VITALS — BP 146/61 | HR 73 | Temp 98.1°F | Resp 18 | Ht 61.0 in | Wt 239.4 lb

## 2017-05-07 DIAGNOSIS — Z23 Encounter for immunization: Secondary | ICD-10-CM

## 2017-05-07 DIAGNOSIS — C20 Malignant neoplasm of rectum: Secondary | ICD-10-CM

## 2017-05-07 DIAGNOSIS — I10 Essential (primary) hypertension: Secondary | ICD-10-CM | POA: Diagnosis not present

## 2017-05-07 LAB — CEA (IN HOUSE-CHCC): CEA (CHCC-In House): 1.29 ng/mL (ref 0.00–5.00)

## 2017-05-07 MED ORDER — INFLUENZA VAC SPLIT QUAD 0.5 ML IM SUSY
0.5000 mL | PREFILLED_SYRINGE | Freq: Once | INTRAMUSCULAR | Status: AC
  Administered 2017-05-07: 0.5 mL via INTRAMUSCULAR
  Filled 2017-05-07: qty 0.5

## 2017-05-07 NOTE — Telephone Encounter (Signed)
Gave avs and calendar for June and July 2019

## 2017-05-07 NOTE — Progress Notes (Signed)
  [facility] OFFICE PROGRESS NOTE   Diagnosis: Rectal cancer  INTERVAL HISTORY:   Destiny [name] returns as scheduled.  She feels well.  She has rectal urgency and frequent bowel movements, up to 20/day.  No diarrhea.  She underwent a colonoscopy 01/18/2017.  The anastomosis in the distal rectum appeared patent.  No polyps.  Random biopsies were obtained to look for evidence of colitis.  The biopsies returned negative. She has intermittent discomfort in the right lower abdomen near the ileostomy scar. Objective:  Vital signs in last 24 hours:  Blood pressure (!) 146/61, pulse 73, temperature 98.1 F (36.7 C), temperature source Oral, resp. rate 18, height '5\' 1"'$  (1.549 m), weight 239 lb 6.4 oz (108.6 kg), SpO2 97 %.    HEENT: Neck without mass Lymphatics: No cervical, supraclavicular, axillary, or inguinal nodes Resp: Lungs clear bilaterally Cardio: Regular rate and rhythm GI: No hepatosplenomegaly, no mass, mild tenderness in the right lower quadrant Vascular: No leg edema   Lab Results:    Lab Results  Component Value Date   CEA1 <1.00 11/05/2016     Medications: I have reviewed the patient's current medications.  Assessment/Plan: 1. Rectal cancer-clinical stage III (T3 N1)  No loss of mismatch repair protein expression  T3 N1 tumor beginning at 2 cm from the anal verge on an MRI of the pelvis 11/08/2015  CTs of the chest, abdomen, and pelvis 11/01/2015 with no evidence of distant metastatic disease  Initiation of neoadjuvant radiation/Xeloda 11/25/2015; completion of radiation/Xeloda 01/02/2016  Low anterior resection with a coloanal anastomosis 02/26/2016,ypT2,ypN0. Grade 3 adenocarcinoma, negative resection margins09/27/2017  No BRAF, KRAS, NRAS mutation; Microsatellite status could not be determined  CT abdomen/pelvis 03/30/2016-no evidence of recurrent rectal cancer  Cycle 1 adjuvant Xeloda 04/14/2016  Cycle 2 adjuvant Xeloda 05/08/2016  Cycle  3 adjuvant Xeloda 05/29/2016  Cycle 4 adjuvant Xeloda 06/20/2016 (completed approximately 10 days)  Cycle 5 adjuvant Xeloda started approximately 07/19/2016  CT 12/04/2016-no evidence of metastatic disease  2. History of hypertension  3. Left ureteral injury at the low anterior resection-status post a left ureteral neocystostomy and left double-J stent placement 02/26/2016  4. Hyperthyroidism status post radioactive iodine 05/12/2016  5. Ileostomy reversal 09/02/2016  6. Hospitalization 10/20/2016 through 10/22/2016 with UTI/sepsis.   Disposition:  Destiny [name] remains in clinical remission from rectal cancer.  She underwent a negative surveillance colonoscopy in August.  We will follow-up on the CEA from today.  She will return for an office visit and restaging CTs in early July.  She continues follow-up with Dr. Marcello Moores.  We referred her to the pelvic physical therapy clinic to see if this will help with the rectal urgency.  She received an influenza vaccine today.  15 minutes were spent with the patient today.  The majority of the time was used for counseling and coordination of care.  Betsy Coder, MD  05/07/2017  10:06 AM

## 2017-05-12 ENCOUNTER — Ambulatory Visit: Payer: BLUE CROSS/BLUE SHIELD | Admitting: Physical Therapy

## 2017-05-13 ENCOUNTER — Telehealth: Payer: Self-pay | Admitting: Emergency Medicine

## 2017-05-13 ENCOUNTER — Ambulatory Visit: Payer: BLUE CROSS/BLUE SHIELD | Attending: Oncology | Admitting: Physical Therapy

## 2017-05-13 ENCOUNTER — Encounter: Payer: Self-pay | Admitting: Physical Therapy

## 2017-05-13 DIAGNOSIS — L599 Disorder of the skin and subcutaneous tissue related to radiation, unspecified: Secondary | ICD-10-CM | POA: Diagnosis present

## 2017-05-13 DIAGNOSIS — M62838 Other muscle spasm: Secondary | ICD-10-CM | POA: Insufficient documentation

## 2017-05-13 DIAGNOSIS — R279 Unspecified lack of coordination: Secondary | ICD-10-CM

## 2017-05-13 DIAGNOSIS — M6281 Muscle weakness (generalized): Secondary | ICD-10-CM

## 2017-05-13 NOTE — Patient Instructions (Addendum)
Relaxation Exercises with the Urge to Void   When you experience an urge to void:  FIRST  Stop and stand very still    Sit down if you can    Don't move    You need to stay very still to maintain control  SECOND Squeeze your pelvic floor muscles 5 times, like a quick flick, to keep from leaking  THIRD Relax  Take a deep breath and then let it out  Try to make the urge go away by using relaxation and visualization techniques  FINALLY When you feel the urge go away somewhat, walk normally to the bathroom.   If the urge gets suddenly stronger on the way, you may stop again and relax to regain control.  Toileting Techniques for Bowel Movements (Defecation) Using your belly (abdomen) and pelvic floor muscles to have a bowel movement is usually instinctive.  Sometimes people can have problems with these muscles and have to relearn proper defecation (emptying) techniques.  If you have weakness in your muscles, organs that are falling out, decreased sensation in your pelvis, or ignore your urge to go, you may find yourself straining to have a bowel movement.  You are straining if you are: . holding your breath or taking in a huge gulp of air and holding it  . keeping your lips and jaw tensed and closed tightly . turning red in the face because of excessive pushing or forcing . developing or worsening your  hemorrhoids . getting faint while pushing . not emptying completely and have to defecate many times a day  If you are straining, you are actually making it harder for yourself to have a bowel movement.  Many people find they are pulling up with the pelvic floor muscles and closing off instead of opening the anus. Due to lack pelvic floor relaxation and coordination the abdominal muscles, one has to work harder to push the feces out.  Many people have never been taught how to defecate efficiently and effectively.  Notice what happens to your body when you are having a bowel movement.  While  you are sitting on the toilet pay attention to the following areas: . Jaw and mouth position . Angle of your hips   . Whether your feet touch the ground or not . Arm placement  . Spine position . Waist . Belly tension . Anus (opening of the anal canal)  An Evacuation/Defecation Plan   Here are the 4 basic points:  1. Lean forward enough for your elbows to rest on your knees 2. Support your feet on the floor or use a low stool if your feet don't touch the floor  3. Push out your belly as if you have swallowed a beach ball-you should feel a widening of your waist 4. Open and relax your pelvic floor muscles, rather than tightening around the anus      The following conditions my require modifications to your toileting posture:  . If you have had surgery in the past that limits your back, hip, pelvic, knee or ankle flexibility . Constipation   Your healthcare practitioner may make the following additional suggestions and adjustments:  1) Sit on the toilet  a) Make sure your feet are supported. (squatty potty makes a stool, or use a stool) b) Notice your hip angle and spine position-most people find it effective to lean forward or raise their knees, which can help the muscles around the anus to relax  c) When you lean forward,  place your forearms on your thighs for support  2) Relax suggestions a) Breath deeply in through your nose and out slowly through your mouth as if you are smelling the flowers and blowing out the candles. b) To become aware of how to relax your muscles, contracting and releasing muscles can be helpful.  Pull your pelvic floor muscles in tightly by using the image of holding back gas, or closing around the anus (visualize making a circle smaller) and lifting the anus up and in.  Then release the muscles and your anus should drop down and feel open. Repeat 5 times ending with the feeling of relaxation. c) Keep your pelvic floor muscles relaxed; let your belly bulge  out. d) The digestive tract starts at the mouth and ends at the anal opening, so be sure to relax both ends of the tube.  Place your tongue on the roof of your mouth with your teeth separated.  This helps relax your mouth and will help to relax the anus at the same time.  3) Empty (defecation) a) Keep your pelvic floor and sphincter relaxed, then bulge your anal muscles.  Make the anal opening wide.  b) Stick your belly out as if you have swallowed a beach ball. c) Make your belly wall hard using your belly muscles while continuing to breathe. Doing this makes it easier to open your anus. d) Breath out and give a grunt (or try using other sounds such as ahhhh, shhhhh, ohhhh or grrrrrrr).  4) Finish a) As you finish your bowel movement, pull the pelvic floor muscles up and in.  This will leave your anus in the proper place rather than remaining pushed out and down. If you leave your anus pushed out and down, it will start to feel as though that is normal and give you incorrect signals about needing to have a bowel movement.    [facility] [address] [place], [place] [address]

## 2017-05-13 NOTE — Telephone Encounter (Addendum)
Pt verbalized understanding of this note.   ----- Message from Ladell Pier, MD sent at 05/12/2017  6:31 PM EST ----- Please call patient, cea is normal

## 2017-05-13 NOTE — Therapy (Signed)
[facility] [address] [place], [place], [address] Phone: [phone]   Fax:  [phone]  Physical Therapy Evaluation  Patient Details  Name: Destiny [name] MRN: [id number] Date of Birth: 05-Feb-1966 Referring Provider: Ladell Pier, MD   Encounter Date: 05/13/2017  PT End of Session - 05/13/17 1415    Visit Number  1    Date for PT Re-Evaluation  09/02/17    PT Start Time  6962    PT Stop Time  1315    PT Time Calculation (min)  40 min    Activity Tolerance  Patient limited by pain       Past Medical History:  Diagnosis Date  . Cancer ([place]) 10/29/15   rectal invasice adenocarcinoma   . Chronic back pain   . Depression   . Hypertension   . Hyperthyroidism   . Rectal cancer ([place])    Adenocarcinoma dx 10/29/15; Stage III, T3, N1, MO    Past Surgical History:  Procedure Laterality Date  . ABDOMINAL HYSTERECTOMY     vaginal   . BLADDER SURGERY    . EYE SURGERY     LAZY EYE CORRECTION  . ILEOSTOMY    . ILEOSTOMY CLOSURE N/A 09/02/2016   Procedure: ILEOSTOMY REVERSAL;  Surgeon: Leighton Ruff, MD;  Location: [facility];  Service: General;  Laterality: N/A;  . RECTAL EXAM UNDER ANESTHESIA N/A 09/02/2016   Procedure: ANAL EXAM UNDER ANESTHESIA;  Surgeon: Leighton Ruff, MD;  Location: [facility];  Service: General;  Laterality: N/A;  . URETERAL REIMPLANTION Left 02/26/2016   Procedure: left ureteral neocystotomy,double J stent placement, closure vaginal cuff;  Surgeon: Ardis Hughs, MD;  Location: [facility];  Service: Urology;  Laterality: Left;  Marland Kitchen VAGINAL HYSTERECTOMY    . XI ROBOTIC ASSISTED LOWER ANTERIOR RESECTION N/A 02/26/2016   Procedure: XI ROBOTIC ASSISTED LOWER ANTERIOR RESECTION, diverting LOOP ILEOSTOMY, mobilization splenic flexure, omentopexy;  Surgeon: Leighton Ruff, MD;  Location: [facility];  Service: General;  Laterality: N/A;    There were no vitals filed for this visit.   Subjective Assessment -  05/13/17 1239    Subjective  Pt has been having to go to the bathroom up to 20x/ day for BM.  She has been going and not able to go, but will go just a little bit.  Pt has a little pain where the ileostomy was reversed on right lower quadrant.      Limitations  Other (comment) toileting    Patient Stated Goals  have intercourse with partner, less frequency to return to normal activities.    Currently in Pain?  No/denies         [facility] PT Assessment - 05/13/17 0001      Assessment   Medical Diagnosis  C20 (ICD-10-CM) - Malignant neoplasm of rectum    Referring Provider  Ladell Pier, MD    Onset Date/Surgical Date  -- rectal cancer treatment complete 07/2016    Prior Therapy  yes - 1 pre-op visit      Precautions   Precautions  None      Restrictions   Weight Bearing Restrictions  No      Balance Screen   Has the patient fallen in the past 6 months  No      [place] residence    Living Arrangements  Alone      Prior Function   Level of Independence  Independent  Cognition   Overall Cognitive Status  Within Functional Limits for tasks assessed      ROM / Strength   AROM / PROM / Strength  AROM;Strength      AROM   Overall AROM Comments  lumbar WNL      Strength   Overall Strength Comments  bilateral hip abduction 4/5      Palpation   Palpation comment  ileostomy incision right lower quadrant ropy and red      Ambulation/Gait   Gait Pattern  Within Functional Limits             Objective measurements completed on examination: See above findings.    Pelvic Floor Special Questions - 05/13/17 0001    Prior Pelvic/Prostate Exam  Yes    Date of Last Pelvic/Prostate Exam  05/13/17    Are you Pregnant or attempting pregnancy?  No    Prior Pregnancies  Yes    Number of Pregnancies  3    Number of Vaginal Deliveries  3    Any difficulty with labor and deliveries  No    Currently Sexually Active  No    Marinoff  Scale  pain prevents any attempts at intercourse    Urinary Leakage  Yes    How often  only with coughing or sneezing    Pad use  5-6    Activities that cause leaking  Coughing;Sneezing    Urinary urgency  Yes    Fecal incontinence  Yes with strong urge    Strength  weak squeeze, no lift    Strength # of reps  2    Strength # of seconds  3    Tone  low               PT Education - 05/13/17 1414    Education provided  Yes    Education Details  urge to void, toilet techniques, samples of gele and relevium    Person(s) Educated  Patient    Methods  Explanation;Demonstration;Handout    Comprehension  Verbalized understanding       PT Short Term Goals - 05/13/17 1436      PT SHORT TERM GOAL #1   Title  pt will be ind with initial HEP    Time  4    Period  Weeks    Status  New    Target Date  06/10/17      PT SHORT TERM GOAL #2   Title  pt will understand and implement toileting techniques for having bowel movement more quickly without straining    Time  4    Period  Weeks    Status  New    Target Date  06/10/17      PT SHORT TERM GOAL #3   Title  pt will report < or = to 3 pads/day due to improved ability to control bowel movements    Time  4    Period  Weeks    Status  New    Target Date  06/10/17      PT SHORT TERM GOAL #4   Title  pt will report < or = to 15 trips to the bathroom/day    Time  4    Period  Weeks    Status  New    Target Date  06/10/17        PT Long Term Goals - 05/13/17 1439      PT LONG TERM GOAL #1  Title  pt is ind with advanced HEP    Time  16    Period  Weeks    Status  New    Target Date  09/02/17      PT LONG TERM GOAL #2   Title  able to have intercourse no greater than 1/3 Marinoff scale    Time  16    Period  Weeks    Status  New    Target Date  09/02/17      PT LONG TERM GOAL #3   Title  understand how to contract pelvic floor to reduce fecal leakage to neeing only 1 pad/day    Time  16    Period  Weeks     Status  New    Target Date  09/02/17      PT LONG TERM GOAL #4   Title  pt will reduce urge to have bowel movement to < or = to 10x/day    Baseline  20x    Time  16    Period  Weeks    Status  New    Target Date  09/02/17             Plan - 05/13/17 1421    Clinical Impression Statement  Patient presents to clinic s/p rectal cancer treatment that included partial removal of the rectum and large intestine as well as radiation.  Pt has been having frequency of BM and some leakage of urine with coughing and sneezing.  Pt is having difficulty having bowel movement and needs to sit on the toilet up to 15 minutes at a time.  She is unable to have intercourse due to pain and bowel leakage at this time.  Pt is having frequency up to 20x/day.  She presents with hip weakness bilaterally.  She has painful and rope-like scar tissue at lower right quadrant.  She has low tone and weakness of anal sphincter muscles 2/5 MMT.  Pt has redness and irritation around anal sphincter.  Pt  is highly sensative to anal sphincter muscles and puborectalis.  She has reduced endurance only able to hold for 3 seconds at a time.  Pt will benefit from skiled PT to address impairments and for education on retraining bowel habits so she can have improved quality of life and return to normal daily routine.    History and Personal Factors relevant to plan of care:  rectal cancer treatment complete 07/2016, hysterectomy, ileostomy closed, bladder surgery    Clinical Presentation  Stable    Clinical Presentation due to:  pt is stable    Clinical Decision Making  Moderate    Rehab Potential  Good    PT Frequency  1x / week    PT Duration  Other (comment) 16 weeks    PT Treatment/Interventions  ADLs/Self Care Home Management;Biofeedback;Cryotherapy;Electrical Stimulation;Moist Heat;Ultrasound;Gait training;Stair training;Functional mobility training;Therapeutic activities;Therapeutic exercise;Balance training;Neuromuscular  re-education;Patient/family education;Manual techniques;Taping;Dry needling;Passive range of motion;Scar mobilization    PT Next Visit Plan  internal STM if tolerated, external fascial release, review toileting, pelvic floor contraction, hip and glute stretches    Consulted and Agree with Plan of Care  Patient       Patient will benefit from skilled therapeutic intervention in order to improve the following deficits and impairments:  Pain, Decreased strength, Increased fascial restricitons, Increased muscle spasms, Impaired tone, Decreased activity tolerance, Decreased coordination  Visit Diagnosis: Other muscle spasm  Muscle weakness (generalized)  Unspecified lack of coordination  Problem List Patient Active Problem List   Diagnosis Date Noted  . Hypothyroidism 08/24/2016  . Rectal cancer ([place]) 02/26/2016  . Malignant neoplasm of rectum ([place]) 11/17/2015  . Anemia 10/01/2015  . Essential hypertension, benign 09/02/2015  . Obesity 09/02/2015  . Depression 05/02/2015    Zannie Cove, PT 05/13/2017, 3:58 PM  [facility] [address], [place], [place], [address] Phone: [phone]   Fax:  [phone]  Name: Destiny [name] MRN: [id number] Date of Birth: 1966-05-31

## 2017-05-17 ENCOUNTER — Encounter: Payer: BLUE CROSS/BLUE SHIELD | Admitting: Physical Therapy

## 2017-05-19 ENCOUNTER — Ambulatory Visit: Payer: BLUE CROSS/BLUE SHIELD | Admitting: Physical Therapy

## 2017-05-19 ENCOUNTER — Encounter: Payer: Self-pay | Admitting: Physical Therapy

## 2017-05-19 DIAGNOSIS — R279 Unspecified lack of coordination: Secondary | ICD-10-CM

## 2017-05-19 DIAGNOSIS — L599 Disorder of the skin and subcutaneous tissue related to radiation, unspecified: Secondary | ICD-10-CM

## 2017-05-19 DIAGNOSIS — M62838 Other muscle spasm: Secondary | ICD-10-CM

## 2017-05-19 DIAGNOSIS — M6281 Muscle weakness (generalized): Secondary | ICD-10-CM

## 2017-05-19 NOTE — Therapy (Signed)
[facility] [address], [place], [place], [address] Phone: [phone]   Fax:  [phone]  Physical Therapy Treatment  Patient Details  Name: Destiny [name] MRN: [id number] Date of Birth: November 28, 1965 Referring Provider: Ladell Pier, MD   Encounter Date: 05/19/2017  PT End of Session - 05/19/17 1018    Visit Number  2    Date for PT Re-Evaluation  09/02/17    PT Start Time  1018    PT Stop Time  1103    PT Time Calculation (min)  45 min    Activity Tolerance  Patient limited by pain       Past Medical History:  Diagnosis Date  . Cancer ([place]) 10/29/15   rectal invasice adenocarcinoma   . Chronic back pain   . Depression   . Hypertension   . Hyperthyroidism   . Rectal cancer ([place])    Adenocarcinoma dx 10/29/15; Stage III, T3, N1, MO    Past Surgical History:  Procedure Laterality Date  . ABDOMINAL HYSTERECTOMY     vaginal   . BLADDER SURGERY    . EYE SURGERY     LAZY EYE CORRECTION  . ILEOSTOMY    . ILEOSTOMY CLOSURE N/A 09/02/2016   Procedure: ILEOSTOMY REVERSAL;  Surgeon: Leighton Ruff, MD;  Location: [facility];  Service: General;  Laterality: N/A;  . RECTAL EXAM UNDER ANESTHESIA N/A 09/02/2016   Procedure: ANAL EXAM UNDER ANESTHESIA;  Surgeon: Leighton Ruff, MD;  Location: [facility];  Service: General;  Laterality: N/A;  . URETERAL REIMPLANTION Left 02/26/2016   Procedure: left ureteral neocystotomy,double J stent placement, closure vaginal cuff;  Surgeon: Ardis Hughs, MD;  Location: [facility];  Service: Urology;  Laterality: Left;  Marland Kitchen VAGINAL HYSTERECTOMY    . XI ROBOTIC ASSISTED LOWER ANTERIOR RESECTION N/A 02/26/2016   Procedure: XI ROBOTIC ASSISTED LOWER ANTERIOR RESECTION, diverting LOOP ILEOSTOMY, mobilization splenic flexure, omentopexy;  Surgeon: Leighton Ruff, MD;  Location: [facility];  Service: General;  Laterality: N/A;    There were no vitals filed for this visit.  Subjective Assessment -  05/19/17 1018    Subjective  Pt is still having a lot of BM and it is "just a blob".  It comes out and I don't even know it.     Limitations  Other (comment)    Patient Stated Goals  have intercourse with partner, less frequency to return to normal activities.    Currently in Pain?  No/denies                      [facility] Adult PT Treatment/Exercise - 05/19/17 0001      Self-Care   Self-Care  Other Self-Care Comments    Other Self-Care Comments   reviewed toilet techniques; educated on using dilators      Manual Therapy   Manual Therapy  Internal Pelvic Floor    Manual therapy comments  pt informed and consent given to perform internal soft tissue release    Internal Pelvic Floor  soft tissue and MFR to taught circular band around sphincter muscles              PT Education - 05/19/17 1049    Education provided  Yes    Education Details  dilators, piriformis    Person(s) Educated  Patient    Methods  Explanation;Demonstration;Handout    Comprehension  Verbalized understanding;Returned demonstration       PT Short Term Goals -  05/19/17 1119      PT SHORT TERM GOAL #1   Title  pt will be ind with initial HEP    Time  4    Period  Weeks    Status  On-going      PT SHORT TERM GOAL #2   Title  pt will understand and implement toileting techniques for having bowel movement more quickly without straining    Time  4    Period  Weeks    Status  On-going      PT SHORT TERM GOAL #3   Title  pt will report < or = to 3 pads/day due to improved ability to control bowel movements    Time  4    Period  Weeks    Status  On-going      PT SHORT TERM GOAL #4   Title  pt will report < or = to 15 trips to the bathroom/day    Time  4    Period  Weeks    Status  On-going        PT Long Term Goals - 05/13/17 1439      PT LONG TERM GOAL #1   Title  pt is ind with advanced HEP    Time  16    Period  Weeks    Status  New    Target Date  09/02/17      PT LONG  TERM GOAL #2   Title  able to have intercourse no greater than 1/3 Marinoff scale    Time  16    Period  Weeks    Status  New    Target Date  09/02/17      PT LONG TERM GOAL #3   Title  understand how to contract pelvic floor to reduce fecal leakage to neeing only 1 pad/day    Time  16    Period  Weeks    Status  New    Target Date  09/02/17      PT LONG TERM GOAL #4   Title  pt will reduce urge to have bowel movement to < or = to 10x/day    Baseline  20x    Time  16    Period  Weeks    Status  New    Target Date  09/02/17            Plan - 05/19/17 1110    Clinical Impression Statement  Pt able to tolerate soft tissue release beyond internal sphincter today.  She has erythema and very sensative thin skin around the anus.  Pt had some fecal matter residue upon inspection of tissue.  PT used relevium for soft tissue work today which pt responded well to.  Pt has tight ER and flexion and was given stretches to add to HEP.  Pt understood eudcation given on dilators and for review of toileting techniques.  Pt will benefit from skilled PT to work on soft tissue     PT Treatment/Interventions  ADLs/Self Care Home Management;Biofeedback;Cryotherapy;Electrical Stimulation;Moist Heat;Ultrasound;Gait training;Stair training;Functional mobility training;Therapeutic activities;Therapeutic exercise;Balance training;Neuromuscular re-education;Patient/family education;Manual techniques;Taping;Dry needling;Passive range of motion;Scar mobilization    PT Next Visit Plan  vaginal assessment for stenosis and education on vaginal dilators, hip and lumbar stretches, pelvic floor contraction    Consulted and Agree with Plan of Care  Patient       Patient will benefit from skilled therapeutic intervention in order to improve the following deficits and impairments:  Pain, Decreased strength, Increased fascial restricitons, Increased muscle spasms, Impaired tone, Decreased activity tolerance, Decreased  coordination  Visit Diagnosis: Other muscle spasm  Muscle weakness (generalized)  Unspecified lack of coordination  Disorder of the skin and subcutaneous tissue related to radiation, unspecified     Problem List Patient Active Problem List   Diagnosis Date Noted  . Hypothyroidism 08/24/2016  . Rectal cancer ([place]) 02/26/2016  . Malignant neoplasm of rectum ([place]) 11/17/2015  . Anemia 10/01/2015  . Essential hypertension, benign 09/02/2015  . Obesity 09/02/2015  . Depression 05/02/2015    Zannie Cove, PT 05/19/2017, 11:20 AM  [facility] [address], [place], [place], [address] Phone: [phone]   Fax:  [phone]  Name: Destiny [name] MRN: [id number] Date of Birth: 05-27-1966

## 2017-05-19 NOTE — Patient Instructions (Addendum)
Guide to Using a Anal Dilator ( No radiation) The anal dilator is used to stretch the anal canal from surgery or radiation. Is  a smooth plastic cylinder that is 6 inches long. It is used to stretch the anal canal to assist in having a bowel movement.  1.  Use the dilator your therapist has directed you to  2. Lubricate both the anus and tip of the dilator.  Do not use petroleum based lubricant due to increased risk of infection and more difficult to wash off.  3. Lay on your side to insert the tip of the dilator at a right angle to the rectum and lightly insert the dilator.  Exhale as you gently ease the dilator into the anal canal.  Breathe in deeply and inch the dilator deeper. See below for frequency: Anal stenosis- holds dilator in 2-3 minutes 2 times daily for 3-4 months Anal fissure- hold dilator 2-3 minutes 1x/day 7-15 days Hemmorrhoidectomy- use 1 time per day for 1st month then every other day for 2nd month, and 2x/wk. for third month 4. Remove the dilator. Wash your hands and the dilator with warm water and soap. Let the dilator dry completely to prevent bacteria build-up.  Purchase dilators at [email or web address]  Search Anal dilator  Dilator can be chilled and used to decrease inflammation     PIRIFORMIS STRETCH  While lying on your back with both knee bent, cross your affected leg on the other knee.   Next, hold your unaffected thigh and pull it up towards your chest until a stretch is felt in the buttock. Hold 30 sec repeat 3x/ day    Single Knee to Chest with belt  While lying on your back, hold your knee with a belt or towel and gently pull it up towards your chest. Hold 30 sec repeat 3x/day     Hamstring Stretch Seated w/Stool  Sit on edge of seat with leg that you are stretching straight out with foot on stool. Keeping back straight and leg straight bend forward at the hip until a stretch is felt in the back of leg and hold.  Hold 30 sec repeat 3x each side.   Do stretch every day  [facility] [address], [place], [place] [address] Phone # [phone] Fax [phone]

## 2017-06-03 ENCOUNTER — Ambulatory Visit: Payer: BLUE CROSS/BLUE SHIELD | Attending: Oncology | Admitting: Physical Therapy

## 2017-06-03 ENCOUNTER — Encounter: Payer: Self-pay | Admitting: Physical Therapy

## 2017-06-03 DIAGNOSIS — M62838 Other muscle spasm: Secondary | ICD-10-CM | POA: Insufficient documentation

## 2017-06-03 DIAGNOSIS — L599 Disorder of the skin and subcutaneous tissue related to radiation, unspecified: Secondary | ICD-10-CM | POA: Diagnosis present

## 2017-06-03 DIAGNOSIS — M6281 Muscle weakness (generalized): Secondary | ICD-10-CM

## 2017-06-03 DIAGNOSIS — R279 Unspecified lack of coordination: Secondary | ICD-10-CM | POA: Diagnosis present

## 2017-06-03 NOTE — Patient Instructions (Signed)
   [facility] [address], [address] [place], [place] [address] Phone # [phone] Fax [phone]  

## 2017-06-03 NOTE — Therapy (Signed)
[facility] [address], [place], [place], [address] Phone: [phone]   Fax:  [phone]  Physical Therapy Treatment  Patient Details  Name: Destiny [name] MRN: [id number] Date of Birth: 13-Oct-1965 Referring Provider: Ladell Pier, MD   Encounter Date: 06/03/2017  PT End of Session - 06/03/17 1028    Visit Number  3    Date for PT Re-Evaluation  09/02/17    PT Start Time  7001 pt arrived 12 min late    PT Stop Time  1059    PT Time Calculation (min)  31 min    Activity Tolerance  Patient limited by pain       Past Medical History:  Diagnosis Date  . Cancer ([place]) 10/29/15   rectal invasice adenocarcinoma   . Chronic back pain   . Depression   . Hypertension   . Hyperthyroidism   . Rectal cancer ([place])    Adenocarcinoma dx 10/29/15; Stage III, T3, N1, MO    Past Surgical History:  Procedure Laterality Date  . ABDOMINAL HYSTERECTOMY     vaginal   . BLADDER SURGERY    . EYE SURGERY     LAZY EYE CORRECTION  . ILEOSTOMY    . ILEOSTOMY CLOSURE N/A 09/02/2016   Procedure: ILEOSTOMY REVERSAL;  Surgeon: Leighton Ruff, MD;  Location: [facility];  Service: General;  Laterality: N/A;  . RECTAL EXAM UNDER ANESTHESIA N/A 09/02/2016   Procedure: ANAL EXAM UNDER ANESTHESIA;  Surgeon: Leighton Ruff, MD;  Location: [facility];  Service: General;  Laterality: N/A;  . URETERAL REIMPLANTION Left 02/26/2016   Procedure: left ureteral neocystotomy,double J stent placement, closure vaginal cuff;  Surgeon: Ardis Hughs, MD;  [facility]: [facility];  Service: Urology;  Laterality: Left;  Marland Kitchen VAGINAL HYSTERECTOMY    . XI ROBOTIC ASSISTED LOWER ANTERIOR RESECTION N/A 02/26/2016   Procedure: XI ROBOTIC ASSISTED LOWER ANTERIOR RESECTION, diverting LOOP ILEOSTOMY, mobilization splenic flexure, omentopexy;  Surgeon: Leighton Ruff, MD;  Location: [facility];  Service: General;  Laterality: N/A;    There were no vitals filed for this visit.  Subjective  Assessment - 06/03/17 1032    Subjective  It doesn't seem to be any better.  I had a rough couple days and going about 10x/ day.  Seems like greasy food makes things worse.  Started walking yesterday and walked 2 miles.      Patient Stated Goals  have intercourse with partner, less frequency to return to normal activities.    Currently in Pain?  No/denies                      [facility] Adult PT Treatment/Exercise - 06/03/17 0001      Knee/Hip Exercises: Stretches   Active Hamstring Stretch  1 rep;Right;Left;60 seconds      Manual Therapy   Manual Therapy  Internal Pelvic Floor    Manual therapy comments  pt informed and consent given to perform internal soft tissue release    Internal Pelvic Floor  soft tissue release vaginally, trigger point release to              PT Education - 06/03/17 1105    Education provided  Yes    Education Details  self massage and where to find dilators and massage tool    Person(s) Educated  Patient    Methods  Explanation;Handout;Demonstration;Verbal cues;Tactile cues    Comprehension  Verbalized understanding  PT Short Term Goals - 06/03/17 1111      PT SHORT TERM GOAL #1   Title  pt will be ind with initial HEP    Baseline  doing stretches daily    Time  4    Period  Weeks    Status  Achieved      PT SHORT TERM GOAL #2   Title  pt will understand and implement toileting techniques for having bowel movement more quickly without straining    Baseline  has stool, not sure if it makes a difference yet    Time  4    Period  Weeks    Status  On-going      PT SHORT TERM GOAL #3   Title  pt will report < or = to 3 pads/day due to improved ability to control bowel movements    Time  4    Period  Weeks    Status  On-going      PT SHORT TERM GOAL #4   Title  pt will report < or = to 15 trips to the bathroom/day    Baseline  10x / day recently    Time  4    Period  Weeks    Status  On-going        PT Long Term Goals  - 05/13/17 1439      PT LONG TERM GOAL #1   Title  pt is ind with advanced HEP    Time  16    Period  Weeks    Status  New    Target Date  09/02/17      PT LONG TERM GOAL #2   Title  able to have intercourse no greater than 1/3 Marinoff scale    Time  16    Period  Weeks    Status  New    Target Date  09/02/17      PT LONG TERM GOAL #3   Title  understand how to contract pelvic floor to reduce fecal leakage to neeing only 1 pad/day    Time  16    Period  Weeks    Status  New    Target Date  09/02/17      PT LONG TERM GOAL #4   Title  pt will reduce urge to have bowel movement to < or = to 10x/day    Baseline  20x    Time  16    Period  Weeks    Status  New    Target Date  09/02/17            Plan - 06/03/17 1106    Clinical Impression Statement  Pt has muscle spasms and stiffness of levators, coccygeus, obdurators bilaterally.  Pt demonstrates weak squeeze with no lift strength assessed vaginal.  Pt was able to get some release with STM to internal plevic floor with manual techniques, but it limited due to being late to appointment today.  Pt continues to have leakage will benefit from skilled PT to work on abilty to relax pelvic floor.    PT Treatment/Interventions  ADLs/Self Care Home Management;Biofeedback;Cryotherapy;Electrical Stimulation;Moist Heat;Ultrasound;Gait training;Stair training;Functional mobility training;Therapeutic activities;Therapeutic exercise;Balance training;Neuromuscular re-education;Patient/family education;Manual techniques;Taping;Dry needling;Passive range of motion;Scar mobilization    PT Next Visit Plan  internal STM and add to stretches and review as needed    Consulted and Agree with Plan of Care  Patient       Patient will benefit from skilled therapeutic intervention  in order to improve the following deficits and impairments:  Pain, Decreased strength, Increased fascial restricitons, Increased muscle spasms, Impaired tone, Decreased  activity tolerance, Decreased coordination  Visit Diagnosis: Other muscle spasm  Muscle weakness (generalized)  Unspecified lack of coordination  Disorder of the skin and subcutaneous tissue related to radiation, unspecified     Problem List Patient Active Problem List   Diagnosis Date Noted  . Hypothyroidism 08/24/2016  . Rectal cancer ([place]) 02/26/2016  . Malignant neoplasm of rectum ([place]) 11/17/2015  . Anemia 10/01/2015  . Essential hypertension, benign 09/02/2015  . Obesity 09/02/2015  . Depression 05/02/2015    Zannie Cove, PT 06/03/2017, 11:12 AM  [facility] [address] [place], [place], [address] Phone: [phone]   Fax:  [phone]  Name: Destiny [name] MRN: [id number] Date of Birth: [birth year]

## 2017-06-09 ENCOUNTER — Other Ambulatory Visit: Payer: Self-pay | Admitting: Oncology

## 2017-06-09 ENCOUNTER — Telehealth: Payer: Self-pay | Admitting: *Deleted

## 2017-06-09 ENCOUNTER — Other Ambulatory Visit: Payer: Self-pay | Admitting: Family Medicine

## 2017-06-09 DIAGNOSIS — C20 Malignant neoplasm of rectum: Secondary | ICD-10-CM

## 2017-06-09 NOTE — Telephone Encounter (Signed)
What is she trying for her bowels, zofran will not help stool frequency

## 2017-06-09 NOTE — Telephone Encounter (Signed)
Received Zofran refill request. Called pt to inquire whether she has new issue with nausea. She reports she is trying different things to decrease the number of bowel movements she has per day and thinks this is causing the occasional nausea. Pt reports she is seeing PT for pelvic floor exercises but has not noticed an improvement yet. Message to MD.

## 2017-06-10 ENCOUNTER — Ambulatory Visit: Payer: BLUE CROSS/BLUE SHIELD | Admitting: Physical Therapy

## 2017-06-10 DIAGNOSIS — R279 Unspecified lack of coordination: Secondary | ICD-10-CM

## 2017-06-10 DIAGNOSIS — M62838 Other muscle spasm: Secondary | ICD-10-CM | POA: Diagnosis not present

## 2017-06-10 DIAGNOSIS — M6281 Muscle weakness (generalized): Secondary | ICD-10-CM

## 2017-06-10 NOTE — Patient Instructions (Signed)
Balloon Breath    Place hands LIGHTLY on belly below navel. Imagine a balloon inside belly. Blow up balloon on breath IN, deflate balloon on breath OUT. Contract abdominals slightly to assist breath OUT. Repeat for 10 breaths, do 3 x /day  Copyright  VHI. All rights reserved.    Piriformis Stretch    Lying on back, pull right knee toward opposite shoulder. Hold _30___ seconds. Repeat __2__ times. Do _1___ sessions per day.  http://gt2.exer.us/258   Copyright  VHI. All rights reserved.   Piriformis Stretch, Supine    Lie supine, one ankle crossed onto opposite knee. Holding bottom leg behind knee, gently pull legs toward chest until stretch is felt in buttock of top leg. Hold _30__ seconds. For deeper stretch gently push top knee away from body.  Repeat _2__ times per session. Do _2__ sessions per day.  Copyright  VHI. All rights reserved.    Supine Knee-to-Chest, Unilateral    Lie on back, hands clasped behind one knee. Pull knee in toward chest until a comfortable stretch is felt in lower back and buttocks. Hold 30___ seconds.  Repeat __2_ times per session. Do _1__ sessions per day.  Copyright  VHI. All rights reserved.  Supine With Rotation    Lie on back with one knee drawn toward chest. Slowly bring bent leg across body until stretch is felt in lower back area. Hold _30__ seconds. Repeat to other side. Repeat _2__ times per session. Do __2_ sessions per day.  Copyright  VHI. All rights reserved.  Butterfly, Supine    Lie on back, feet together. Lower knees toward floor. Hold 60___ seconds. Repeat _2__ times per session. Do _1__ sessions per day.  Copyright  VHI. All rights reserved.      HAMSTRING STRETCH WITH TOWEL  While lying down on your back, hook a towel or strap under  your foot and draw up your leg until a stretch is felt under your leg. calf area.  Keep your knee in a straightened position during the stretch.   STRETCHING THE  PELVIC FLOOR MUSCLES NO DILATOR  Supplies . Vaginal lubricant . Mirror (optional) . Gloves (optional) Positioning . Start in a semi-reclined position with your head propped up. Bend your knees and place your thumb or finger at the vaginal opening. Procedure . Apply a moderate amount of lubricant on the outer skin of your vagina, the labia minora.  Apply additional lubricant to your finger. Marland Kitchen Spread the skin away from the vaginal opening. Place the end of your finger at the opening. . Do a maximum contraction of the pelvic floor muscles. Tighten the vagina and the anus maximally and relax. . When you know they are relaxed, gently and slowly insert your finger into your vagina, directing your finger slightly downward, for 2-3 inches of insertion. . Relax and stretch the 6 o'clock position . Hold each stretch for _2 min__ and repeat __1_ time with rest breaks of _1__ seconds between each stretch. . Repeat the stretching in the 4 o'clock and 8 o'clock positions. . Total time should be _6__ minutes, _1__ x per day.  Note the amount of theme your were able to achieve and your tolerance to your finger in your vagina. . Once you have accomplished the techniques you may try them in standing with one foot resting on the tub, or in other positions.  This is a good stretch to do in the shower if you don't need to use lubricant.   PROTOCOL FOR DILATORS  1. Wash dilator with soap and water prior to insertion.    2. Lay on your back reclined. Knees are to be up and apart while on your bed or in the bathtub with warm water.   3. Lubricate the end of the dilator with a water-soluble lubricant.  4. Separate the labia.   5. Tense the pelvic floor muscles than relax; while relaxing, slide lubricated dilator into the vagina.    6. Tense muscles again while holding the dilator so it does not get pushed out; relax and slide it in a little further.   7. Try blowing out as if filling a balloon; this may  relax the muscles and allow penetration.  Repeat blowing out to insert dilator further.  8. Keep dilator in for 10 minutes if tolerate, with the pelvic floor muscles relaxed to further stretch the canal.   9. Never force the dilator into the canal.  10. 1-2 times per day   [facility] [address], [place], [place] [address] Phone # [phone] Fax [phone]

## 2017-06-10 NOTE — Therapy (Signed)
[facility] [address], [place], [place], [address] Phone: [phone]   Fax:  [phone]  Physical Therapy Treatment  Patient Details  Name: Destiny [name] MRN: [id number] Date of Birth: 1966/01/10 Referring Provider: Ladell Pier, MD   Encounter Date: 06/10/2017  PT End of Session - 06/10/17 1025    Visit Number  4    Date for PT Re-Evaluation  09/02/17    PT Start Time  1022    PT Stop Time  1100    PT Time Calculation (min)  38 min    Activity Tolerance  Patient tolerated treatment well    Behavior During Therapy  [facility] for tasks assessed/performed       Past Medical History:  Diagnosis Date  . Cancer ([place]) 10/29/15   rectal invasice adenocarcinoma   . Chronic back pain   . Depression   . Hypertension   . Hyperthyroidism   . Rectal cancer ([place])    Adenocarcinoma dx 10/29/15; Stage III, T3, N1, MO    Past Surgical History:  Procedure Laterality Date  . ABDOMINAL HYSTERECTOMY     vaginal   . BLADDER SURGERY    . EYE SURGERY     LAZY EYE CORRECTION  . ILEOSTOMY    . ILEOSTOMY CLOSURE N/A 09/02/2016   Procedure: ILEOSTOMY REVERSAL;  Surgeon: Leighton Ruff, MD;  [facility];  Service: General;  Laterality: N/A;  . RECTAL EXAM UNDER ANESTHESIA N/A 09/02/2016   Procedure: ANAL EXAM UNDER ANESTHESIA;  Surgeon: Leighton Ruff, MD;  [facility];  Service: General;  Laterality: N/A;  . URETERAL REIMPLANTION Left 02/26/2016   Procedure: left ureteral neocystotomy,double J stent placement, closure vaginal cuff;  Surgeon: Ardis Hughs, MD;  [facility];  Service: Urology;  Laterality: Left;  Marland Kitchen VAGINAL HYSTERECTOMY    . XI ROBOTIC ASSISTED LOWER ANTERIOR RESECTION N/A 02/26/2016   Procedure: XI ROBOTIC ASSISTED LOWER ANTERIOR RESECTION, diverting LOOP ILEOSTOMY, mobilization splenic flexure, omentopexy;  Surgeon: Leighton Ruff, MD;  [facility];  Service: General;  Laterality: N/A;    There  were no vitals filed for this visit.  Subjective Assessment - 06/10/17 1025    Subjective  I didn't have any bowel movements yesterday and that felt so good.  Monday I had one big one.    Patient Stated Goals  have intercourse with partner, less frequency to return to normal activities.    Currently in Pain?  No/denies                      [facility] Adult PT Treatment/Exercise - 06/10/17 0001      Neuro Re-ed    Neuro Re-ed Details   contract relax and bulge with breathing - tactile and verbal feedback for greater stretch and for not using glutes for contraction - able to do 10 reps 2/5      Manual Therapy   Manual Therapy  Internal Pelvic Floor    Manual therapy comments  pt informed and consent given to perform internal soft tissue release    Internal Pelvic Floor  soft tissue release vaginally, trigger point release to bilateral coccyeus, levators, bilbocavernosis, transverse peroneum, obdurator internis             PT Education - 06/10/17 1058    Education provided  Yes    Education Details  pelvic floor stretches and instructions for self massage and dilators    Person(s) Educated  Patient  Methods  Explanation;Demonstration;Verbal cues;Tactile cues;Handout    Comprehension  Verbalized understanding;Returned demonstration       PT Short Term Goals - 06/10/17 1059      PT SHORT TERM GOAL #2   Title  pt will understand and implement toileting techniques for having bowel movement more quickly without straining    Baseline  had a large full BM on Monday    Time  4    Period  Weeks    Status  On-going      PT SHORT TERM GOAL #3   Title  pt will report < or = to 3 pads/day due to improved ability to control bowel movements    Baseline  6-7 per day usually    Time  4    Period  Weeks    Status  On-going      PT SHORT TERM GOAL #4   Title  pt will report < or = to 15 trips to the bathroom/day    Baseline  10x / day recently    Time  4    Period  Weeks     Status  On-going        PT Long Term Goals - 05/13/17 1439      PT LONG TERM GOAL #1   Title  pt is ind with advanced HEP    Time  16    Period  Weeks    Status  New    Target Date  09/02/17      PT LONG TERM GOAL #2   Title  able to have intercourse no greater than 1/3 Marinoff scale    Time  16    Period  Weeks    Status  New    Target Date  09/02/17      PT LONG TERM GOAL #3   Title  understand how to contract pelvic floor to reduce fecal leakage to neeing only 1 pad/day    Time  16    Period  Weeks    Status  New    Target Date  09/02/17      PT LONG TERM GOAL #4   Title  pt will reduce urge to have bowel movement to < or = to 10x/day    Baseline  20x    Time  16    Period  Weeks    Status  New    Target Date  09/02/17            Plan - 06/10/17 1136    Clinical Impression Statement  Patient has mucles spasms of bulbospongeosis, levators, coccygeus, obdurators.  She had release of tight tissues with internal STM.  She was able to perform some contract and relax with breathing and with tactile cues for 10 reps and then muscles became fatigued.  Pt continues to benefit from skilled PT    PT Treatment/Interventions  ADLs/Self Care Home Management;Biofeedback;Cryotherapy;Electrical Stimulation;Moist Heat;Ultrasound;Gait training;Stair training;Functional mobility training;Therapeutic activities;Therapeutic exercise;Balance training;Neuromuscular re-education;Patient/family education;Manual techniques;Taping;Dry needling;Passive range of motion;Scar mobilization    PT Next Visit Plan  internal STM and contracting bulging; add to stretches and review as needed       Patient will benefit from skilled therapeutic intervention in order to improve the following deficits and impairments:  Pain, Decreased strength, Increased fascial restricitons, Increased muscle spasms, Impaired tone, Decreased activity tolerance, Decreased coordination  Visit Diagnosis: Other muscle  spasm  Muscle weakness (generalized)  Unspecified lack of coordination     Problem List Patient  Active Problem List   Diagnosis Date Noted  . Hypothyroidism 08/24/2016  . Rectal cancer ([place]) 02/26/2016  . Malignant neoplasm of rectum ([place]) 11/17/2015  . Anemia 10/01/2015  . Essential hypertension, benign 09/02/2015  . Obesity 09/02/2015  . Depression 05/02/2015    Zannie Cove, PT 06/10/2017, 2:03 PM   [facility] [address] [place], [place], [address] Phone: [phone]   Fax:  [phone]  Name: Destiny [name] MRN: [id number] Date of Birth: [birth year]

## 2017-06-16 ENCOUNTER — Encounter: Payer: BLUE CROSS/BLUE SHIELD | Admitting: Physical Therapy

## 2017-06-23 ENCOUNTER — Telehealth: Payer: Self-pay | Admitting: Physical Therapy

## 2017-06-23 ENCOUNTER — Ambulatory Visit: Payer: BLUE CROSS/BLUE SHIELD | Admitting: Physical Therapy

## 2017-06-23 NOTE — Telephone Encounter (Signed)
Pt no showed for her appointment today.  PT called and left message with appointment reminder.  Today is first no show to PT.  Zannie Cove, PT 06/23/17 10:38 AM

## 2017-06-30 ENCOUNTER — Ambulatory Visit: Payer: BLUE CROSS/BLUE SHIELD | Admitting: Physical Therapy

## 2017-07-02 ENCOUNTER — Ambulatory Visit: Payer: PRIVATE HEALTH INSURANCE | Attending: Oncology | Admitting: Physical Therapy

## 2017-07-02 ENCOUNTER — Encounter: Payer: Self-pay | Admitting: Physical Therapy

## 2017-07-02 DIAGNOSIS — R279 Unspecified lack of coordination: Secondary | ICD-10-CM | POA: Insufficient documentation

## 2017-07-02 DIAGNOSIS — L599 Disorder of the skin and subcutaneous tissue related to radiation, unspecified: Secondary | ICD-10-CM | POA: Insufficient documentation

## 2017-07-02 DIAGNOSIS — M62838 Other muscle spasm: Secondary | ICD-10-CM | POA: Insufficient documentation

## 2017-07-02 DIAGNOSIS — M6281 Muscle weakness (generalized): Secondary | ICD-10-CM | POA: Insufficient documentation

## 2017-07-02 NOTE — Patient Instructions (Signed)
   Guide to Using a Anal Dilator ( No radiation) The anal dilator is used to stretch the anal canal from surgery or radiation. Is  a smooth plastic cylinder that is 6 inches long. It is used to stretch the anal canal to assist in having a bowel movement.  1.  Use the dilator your therapist has directed you to  2. Lubricate both the anus and tip of the dilator.  Do not use petroleum based lubricant due to increased risk of infection and more difficult to wash off.  3. Lay on your side to insert the tip of the dilator at a right angle to the rectum and lightly insert the dilator.  Exhale as you gently ease the dilator into the anal canal.  Breathe in deeply and inch the dilator deeper. See below for frequency: Anal stenosis- holds dilator in 2-3 minutes 2 times daily for 3-4 months Anal fissure- hold dilator 2-3 minutes 1x/day 7-15 days Hemmorrhoidectomy- use 1 time per day for 1st month then every other day for 2nd month, and 2x/wk. for third month 4. Remove the dilator. Wash your hands and the dilator with warm water and soap. Let the dilator dry completely to prevent bacteria build-up.  [facility] [address], [place], [place] [address] Phone # [phone] Fax [phone]

## 2017-07-02 NOTE — Therapy (Addendum)
[facility] [address], [place], [place], [address] Phone: [phone]   Fax:  [phone]  Physical Therapy Treatment  Patient Details  Name: Destiny [name] MRN: [id number] Date of Birth: Nov 26, 1965 Referring Provider: Ladell Pier, MD   Encounter Date: 07/02/2017  PT End of Session - 07/02/17 0851    Visit Number  5    Date for PT Re-Evaluation  09/02/17    PT Start Time  0848    PT Stop Time  0930    PT Time Calculation (min)  42 min    Activity Tolerance  Patient tolerated treatment well    Behavior During Therapy  [facility] for tasks assessed/performed       Past Medical History:  Diagnosis Date  . Cancer ([place]) 10/29/15   rectal invasice adenocarcinoma   . Chronic back pain   . Depression   . Hypertension   . Hyperthyroidism   . Rectal cancer ([place])    Adenocarcinoma dx 10/29/15; Stage III, T3, N1, MO    Past Surgical History:  Procedure Laterality Date  . ABDOMINAL HYSTERECTOMY     vaginal   . BLADDER SURGERY    . EYE SURGERY     LAZY EYE CORRECTION  . ILEOSTOMY    . ILEOSTOMY CLOSURE N/A 09/02/2016   Procedure: ILEOSTOMY REVERSAL;  Surgeon: Leighton Ruff, MD;  Location: [facility];  Service: General;  Laterality: N/A;  . RECTAL EXAM UNDER ANESTHESIA N/A 09/02/2016   Procedure: ANAL EXAM UNDER ANESTHESIA;  Surgeon: Leighton Ruff, MD;  Location: [facility];  Service: General;  Laterality: N/A;  . URETERAL REIMPLANTION Left 02/26/2016   Procedure: left ureteral neocystotomy,double J stent placement, closure vaginal cuff;  Surgeon: Ardis Hughs, MD;  Location: [facility];  Service: Urology;  Laterality: Left;  Marland Kitchen VAGINAL HYSTERECTOMY    . XI ROBOTIC ASSISTED LOWER ANTERIOR RESECTION N/A 02/26/2016   Procedure: XI ROBOTIC ASSISTED LOWER ANTERIOR RESECTION, diverting LOOP ILEOSTOMY, mobilization splenic flexure, omentopexy;  Surgeon: Leighton Ruff, MD;  Location: [facility];  Service: General;  Laterality: N/A;    There  were no vitals filed for this visit.  Subjective Assessment - 07/02/17 0849    Subjective  I have been practicing holding it and can make it more often.  I had about 3 accidents this week all on bad days where I was having to go to the bathroom.      Limitations  Other (comment)    Patient Stated Goals  have intercourse with partner, less frequency to return to normal activities.    Currently in Pain?  No/denies                      [facility] Adult PT Treatment/Exercise - 07/02/17 0001      Self-Care   Other Self-Care Comments   how to use dilators      Neuro Re-ed    Neuro Re-ed Details   contract and relax with breathing on green ball; cirlces and side to side on ball; tactile feedback to relax and contract anal sphincters of pelvic floor      Manual Therapy   Manual Therapy  Internal Pelvic Floor;Soft tissue mobilization    Manual therapy comments  pt informed and consent given to perform internal soft tissue release    Soft tissue mobilization  abdominal fascial release to scar tissue    Internal Pelvic Floor  soft tissue release vaginally, trigger point release to bilateral coccyeus,  levators, bilbocavernosis, transverse peroneum, obdurator internis             PT Education - 07/02/17 0957    Education provided  Yes    Education Details  Hegar dilators, instructions on using dilators    Person(s) Educated  Patient    Methods  Explanation;Handout;Verbal cues    Comprehension  Verbalized understanding       PT Short Term Goals - 07/02/17 2050      PT SHORT TERM GOAL #1   Title  pt will be ind with initial HEP    Time  4    Period  Weeks    Status  Achieved      PT SHORT TERM GOAL #2   Title  pt will understand and implement toileting techniques for having bowel movement more quickly without straining    Baseline  is using techniques and getting better sometimes    Time  4    Period  Weeks    Status  Achieved      PT SHORT TERM GOAL #3   Title  pt  will report < or = to 3 pads/day due to improved ability to control bowel movements    Baseline  getting better, had 3 incidents of leakage over span of one week    Time  4    Period  Weeks    Status  Achieved      PT SHORT TERM GOAL #4   Title  pt will report < or = to 15 trips to the bathroom/day    Baseline  still has bad days but getting better    Time  4    Period  Weeks    Status  On-going        PT Long Term Goals - 05/13/17 1439      PT LONG TERM GOAL #1   Title  pt is ind with advanced HEP    Time  16    Period  Weeks    Status  New    Target Date  09/02/17      PT LONG TERM GOAL #2   Title  able to have intercourse no greater than 1/3 Marinoff scale    Time  16    Period  Weeks    Status  New    Target Date  09/02/17      PT LONG TERM GOAL #3   Title  understand how to contract pelvic floor to reduce fecal leakage to neeing only 1 pad/day    Time  16    Period  Weeks    Status  New    Target Date  09/02/17      PT LONG TERM GOAL #4   Title  pt will reduce urge to have bowel movement to < or = to 10x/day    Baseline  20x    Time  16    Period  Weeks    Status  New    Target Date  09/02/17            Plan - 07/02/17 2045    Clinical Impression Statement  Patient was able to tolerate one index finger to stretch anus with increased pain and unable to tolerate any increased stretching to anal sphincter muscles.  Pt was educated in breathing and bulging technique using tactile feedback.  She was educated in using Hegar dilators and will work on that for the next 2-3 weeks so she will be able  to tolerate internal soft tissue release and biofeedback for improved muscle coordination.  Pt recommended to continue skilled PT to work on improved muscle length and coordination to return to maximum function.    PT Treatment/Interventions  ADLs/Self Care Home Management;Biofeedback;Cryotherapy;Electrical Stimulation;Moist Heat;Ultrasound;Gait training;Stair  training;Functional mobility training;Therapeutic activities;Therapeutic exercise;Balance training;Neuromuscular re-education;Patient/family education;Manual techniques;Taping;Dry needling;Passive range of motion;Scar mobilization    PT Next Visit Plan  internal STM and contracting bulging; add to stretches and review use of dilators if needed    Consulted and Agree with Plan of Care  Patient       Patient will benefit from skilled therapeutic intervention in order to improve the following deficits and impairments:  Pain, Decreased strength, Increased fascial restricitons, Increased muscle spasms, Impaired tone, Decreased activity tolerance, Decreased coordination  Visit Diagnosis: Other muscle spasm  Muscle weakness (generalized)  Unspecified lack of coordination  Disorder of the skin and subcutaneous tissue related to radiation, unspecified     Problem List Patient Active Problem List   Diagnosis Date Noted  . Hypothyroidism 08/24/2016  . Rectal cancer ([place]) 02/26/2016  . Malignant neoplasm of rectum ([place]) 11/17/2015  . Anemia 10/01/2015  . Essential hypertension, benign 09/02/2015  . Obesity 09/02/2015  . Depression 05/02/2015    Zannie Cove, PT 07/02/2017, 8:55 PM  [facility] [address] [place], [place], [address] Phone: [phone]   Fax:  [phone]  Name: Destiny [name] MRN: [id number] Date of Birth: [birth year]  PHYSICAL THERAPY DISCHARGE SUMMARY  Visits from Start of Care: 5  Current functional level related to goals / functional outcomes: See above details   Remaining deficits: See above details   Education / Equipment: HEP  Plan: Patient agrees to discharge.  Patient goals were not met. Patient is being discharged due to not returning since the last visit.  ?????     Google, PT 09/15/17 8:23 AM

## 2017-07-12 ENCOUNTER — Ambulatory Visit: Payer: BLUE CROSS/BLUE SHIELD | Admitting: Family Medicine

## 2017-07-13 ENCOUNTER — Encounter: Payer: Self-pay | Admitting: Family Medicine

## 2017-09-20 ENCOUNTER — Other Ambulatory Visit: Payer: Self-pay | Admitting: Family

## 2017-09-20 DIAGNOSIS — F331 Major depressive disorder, recurrent, moderate: Secondary | ICD-10-CM

## 2017-09-20 DIAGNOSIS — R1084 Generalized abdominal pain: Secondary | ICD-10-CM

## 2017-09-20 NOTE — Telephone Encounter (Signed)
From our records this patient has not been on this in over a year, she would need an appointment to restart a medication

## 2017-09-21 NOTE — Telephone Encounter (Signed)
[id number] 4/23

## 2017-09-21 NOTE — Telephone Encounter (Signed)
Pt states [employer] has been refilling this for her and she has not been off of it.

## 2017-10-28 ENCOUNTER — Other Ambulatory Visit: Payer: Self-pay | Admitting: Family

## 2017-10-28 DIAGNOSIS — F331 Major depressive disorder, recurrent, moderate: Secondary | ICD-10-CM

## 2017-10-28 DIAGNOSIS — R1084 Generalized abdominal pain: Secondary | ICD-10-CM

## 2017-11-25 ENCOUNTER — Inpatient Hospital Stay: Payer: Self-pay | Attending: Nurse Practitioner

## 2017-11-29 ENCOUNTER — Inpatient Hospital Stay: Payer: Self-pay | Admitting: Nurse Practitioner

## 2017-11-29 ENCOUNTER — Other Ambulatory Visit: Payer: Self-pay | Admitting: Family

## 2017-11-29 ENCOUNTER — Telehealth: Payer: Self-pay | Admitting: Emergency Medicine

## 2017-11-29 ENCOUNTER — Inpatient Hospital Stay: Payer: Self-pay | Attending: Nurse Practitioner

## 2017-11-29 DIAGNOSIS — F331 Major depressive disorder, recurrent, moderate: Secondary | ICD-10-CM

## 2017-11-29 DIAGNOSIS — I10 Essential (primary) hypertension: Secondary | ICD-10-CM | POA: Insufficient documentation

## 2017-11-29 DIAGNOSIS — R1084 Generalized abdominal pain: Secondary | ICD-10-CM

## 2017-11-29 DIAGNOSIS — C20 Malignant neoplasm of rectum: Secondary | ICD-10-CM | POA: Insufficient documentation

## 2017-11-29 LAB — BASIC METABOLIC PANEL
Anion gap: 7 (ref 5–15)
BUN: 12 mg/dL (ref 6–20)
CHLORIDE: 107 mmol/L (ref 98–111)
CO2: 30 mmol/L (ref 22–32)
Calcium: 9.6 mg/dL (ref 8.9–10.3)
Creatinine, Ser: 0.87 mg/dL (ref 0.44–1.00)
Glucose, Bld: 102 mg/dL — ABNORMAL HIGH (ref 70–99)
POTASSIUM: 4.9 mmol/L (ref 3.5–5.1)
SODIUM: 144 mmol/L (ref 135–145)

## 2017-11-29 LAB — CEA (IN HOUSE-CHCC): CEA (CHCC-In House): <1 ng/mL (ref 0.00–5.00)

## 2017-11-29 NOTE — Telephone Encounter (Signed)
Scan and follow up appt scheduled. pt made aware.

## 2017-11-29 NOTE — Telephone Encounter (Signed)
Central scheduling made aware to call pt to schedule scans. Scheduling message sent for pt to be rescheduled from today's appt d/t pt was not here at appt time. Labs were drawn today

## 2017-11-30 ENCOUNTER — Ambulatory Visit (HOSPITAL_COMMUNITY): Payer: Self-pay

## 2017-11-30 ENCOUNTER — Ambulatory Visit: Payer: Self-pay | Admitting: Nurse Practitioner

## 2017-12-03 ENCOUNTER — Encounter: Payer: Self-pay | Admitting: Family Medicine

## 2017-12-03 ENCOUNTER — Ambulatory Visit (INDEPENDENT_AMBULATORY_CARE_PROVIDER_SITE_OTHER): Payer: Medicaid Other | Admitting: Family Medicine

## 2017-12-03 VITALS — BP 132/69 | HR 68 | Temp 97.4°F | Ht 61.0 in | Wt 228.4 lb

## 2017-12-03 DIAGNOSIS — I10 Essential (primary) hypertension: Secondary | ICD-10-CM

## 2017-12-03 DIAGNOSIS — E890 Postprocedural hypothyroidism: Secondary | ICD-10-CM

## 2017-12-03 DIAGNOSIS — F331 Major depressive disorder, recurrent, moderate: Secondary | ICD-10-CM

## 2017-12-03 MED ORDER — HYDROCHLOROTHIAZIDE 25 MG PO TABS: 25.0000 mg | ORAL_TABLET | Freq: Every day | ORAL | 3 refills | Status: DC

## 2017-12-03 MED ORDER — SERTRALINE HCL 100 MG PO TABS: 100.0000 mg | ORAL_TABLET | Freq: Every day | ORAL | 3 refills | Status: DC

## 2017-12-03 NOTE — Progress Notes (Signed)
BP 132/69   Pulse 68   Temp (!) 97.4 F (36.3 C) (Oral)   Ht 5' 1" (1.549 m)   Wt 228 lb 6.4 oz (103.6 kg)   LMP  (LMP Unknown) Comment: FULL   BMI 43.16 kg/m    Subjective:    Patient ID: Destiny [name], female    DOB: [birth year], 52 y.o.   MRN: [id number]  HPI: Destiny [name] is a 52 y.o. female presenting on 12/03/2017 for Hypertension   HPI Hypertension Patient is currently on hydrochlorothiazide but has been out of it for the past week, and their blood pressure today is 132/69, she is also been having swelling and wants to have her hydrochlorothiazide back. Patient denies any lightheadedness or dizziness. Patient denies headaches, blurred vision, chest pains, shortness of breath, or weakness. Denies any side effects from medication and is content with current medication.   Hypothyroidism recheck Patient is coming in for thyroid recheck today as well. They deny any issues with hair changes or heat or cold problems or diarrhea or constipation. They deny any chest pain or palpitations. They are currently on levothyroxine 112 micrograms   Depression and anxiety Patient is coming in for a anxiety and depression recheck as well.  She has been taking Zoloft and says it is still working great for her.  A lot of her anxiety depression stems from the fact that she was going through rectal cancer and dealing with a lot of fallout and side effects from having part of her colon removed.  She still has a lot of abdominal issues related to that and sees her gastroenterologist which has been a challenge.  She says it affects her ability to work and she is applying for disability but currently lost her insurance. Depression screen [facility] 2/9 12/03/2017 04/09/2017 11/06/2016 10/27/2016 10/20/2016  Decreased Interest 1 2 0 0 2  Down, Depressed, Hopeless 2 1 0 0 2  PHQ - 2 Score 3 3 0 0 4  Altered sleeping 1 2 - - 2  Tired, decreased energy 2 2 - - 2  Change in appetite 2 0 - - 0  Feeling bad or failure about  yourself  1 1 - - 1  Trouble concentrating 1 0 - - 1  Moving slowly or fidgety/restless 0 0 - - 0  Suicidal thoughts 0 0 - - 0  PHQ-9 Score 10 8 - - 10  Difficult doing work/chores - Somewhat difficult - - -     Relevant past medical, surgical, family and social history reviewed and updated as indicated. Interim medical history since our last visit reviewed. Allergies and medications reviewed and updated.  Review of Systems  Constitutional: Negative for chills and fever.  HENT: Negative for congestion, ear discharge and ear pain.   Eyes: Negative for visual disturbance.  Respiratory: Negative for chest tightness and shortness of breath.   Cardiovascular: Negative for chest pain and leg swelling.  Gastrointestinal: Positive for abdominal pain (Diffuse generalized and chronic).  Musculoskeletal: Negative for back pain and gait problem.  Skin: Negative for rash.  Neurological: Negative for dizziness, light-headedness and headaches.  Psychiatric/Behavioral: Positive for dysphoric mood. Negative for agitation and behavioral problems. The patient is nervous/anxious.   All other systems reviewed and are negative.   Per HPI unless specifically indicated above   Allergies as of 12/03/2017      Reactions   Latex Itching, Rash      Medication List  Accurate as of 12/03/17  9:43 AM. Always use your most recent med list.          acetaminophen 500 MG tablet Commonly known as:  TYLENOL Take 1,000 mg by mouth every 8 (eight) hours as needed for moderate pain.   gabapentin 300 MG capsule Commonly known as:  NEURONTIN 1 CAPSULE TODAY, TWICE A DAY FRIDAY, THEN 3 TIMES A DAY FOR 30 DAYS   hydrochlorothiazide 25 MG tablet Commonly known as:  HYDRODIURIL Take 1 tablet (25 mg total) by mouth daily.   levothyroxine 112 MCG tablet Commonly known as:  SYNTHROID, LEVOTHROID Take 1 tablet (112 mcg total) daily before breakfast by mouth.   LORazepam 0.5 MG tablet Commonly known as:   ATIVAN TAKE  (1)  TABLET TWICE A DAY.   nystatin powder Commonly known as:  MYCOSTATIN/NYSTOP Apply topically 4 (four) times daily.   ondansetron 8 MG tablet Commonly known as:  ZOFRAN Take 1 tablet by mouth every 8 hours as needed for nausea or vomiting.   sertraline 100 MG tablet Commonly known as:  ZOLOFT Take 1 tablet (100 mg total) by mouth daily.   vitamin C 500 MG tablet Commonly known as:  ASCORBIC ACID Take 500 mg by mouth daily.          Objective:    BP 132/69   Pulse 68   Temp (!) 97.4 F (36.3 C) (Oral)   Ht 5' 1" (1.549 m)   Wt 228 lb 6.4 oz (103.6 kg)   LMP  (LMP Unknown) Comment: FULL   BMI 43.16 kg/m   Wt Readings from Last 3 Encounters:  12/03/17 228 lb 6.4 oz (103.6 kg)  05/07/17 239 lb 6.4 oz (108.6 kg)  04/12/17 227 lb (103 kg)    Physical Exam  Constitutional: She is oriented to person, place, and time. She appears well-developed and well-nourished. No distress.  Eyes: Conjunctivae are normal.  Neck: Neck supple. No thyromegaly present.  Cardiovascular: Normal rate, regular rhythm, normal heart sounds and intact distal pulses.  No murmur heard. Pulmonary/Chest: Effort normal and breath sounds normal. No respiratory distress. She has no wheezes.  Abdominal: Soft. Bowel sounds are normal. She exhibits no distension. There is tenderness (Mild diffuse tenderness).  Musculoskeletal: Normal range of motion.  Lymphadenopathy:    She has no cervical adenopathy.  Neurological: She is alert and oriented to person, place, and time. Coordination normal.  Skin: Skin is warm and dry. No rash noted. She is not diaphoretic.  Psychiatric: Her behavior is normal. Her mood appears anxious. She exhibits a depressed mood. She expresses no suicidal ideation. She expresses no suicidal plans.  Nursing note and vitals reviewed.       Assessment & Plan:   Problem List Items Addressed This Visit      Cardiovascular and Mediastinum   Essential hypertension,  benign   Relevant Medications   hydrochlorothiazide (HYDRODIURIL) 25 MG tablet   Other Relevant Orders   CMP14+EGFR     Endocrine   Hypothyroidism - Primary   Relevant Orders   TSH     Other   Depression   Relevant Medications   sertraline (ZOLOFT) 100 MG tablet   Other Relevant Orders   CBC with Differential/Platelet   Morbid obesity ([place])      Patient does not have insurance currently, she was out of her blood pressure medication will refill that, she feels like the Zoloft is going well and will continue that.  Gave her information for good [email or web address]  Follow up plan: Return in about 6 months (around 06/05/2018), or if symptoms worsen or fail to improve, for Recheck thyroid and depression and blood pressure.  Counseling provided for all of the vaccine components Orders Placed This Encounter  Procedures  . CBC with Differential/Platelet  . CMP14+EGFR  . TSH    Caryl Pina, MD [facility] 12/03/2017, 9:43 AM

## 2017-12-04 LAB — CMP14+EGFR
A/G RATIO: 1.4 (ref 1.2–2.2)
ALBUMIN: 4 g/dL (ref 3.5–5.5)
ALT: 14 IU/L (ref 0–32)
AST: 16 IU/L (ref 0–40)
Alkaline Phosphatase: 67 IU/L (ref 39–117)
BILIRUBIN TOTAL: 0.2 mg/dL (ref 0.0–1.2)
BUN / CREAT RATIO: 19 (ref 9–23)
BUN: 13 mg/dL (ref 6–24)
CHLORIDE: 105 mmol/L (ref 96–106)
CO2: 25 mmol/L (ref 20–29)
Calcium: 9.3 mg/dL (ref 8.7–10.2)
Creatinine, Ser: 0.7 mg/dL (ref 0.57–1.00)
GFR calc non Af Amer: 100 mL/min/{1.73_m2} (ref >59)
GFR, EST AFRICAN AMERICAN: 115 mL/min/{1.73_m2} (ref >59)
Globulin, Total: 2.9 g/dL (ref 1.5–4.5)
Glucose: 105 mg/dL — ABNORMAL HIGH (ref 65–99)
POTASSIUM: 4.3 mmol/L (ref 3.5–5.2)
Sodium: 144 mmol/L (ref 134–144)
TOTAL PROTEIN: 6.9 g/dL (ref 6.0–8.5)

## 2017-12-04 LAB — CBC WITH DIFFERENTIAL/PLATELET
BASOS: 0 %
Basophils Absolute: 0 10*3/uL (ref 0.0–0.2)
EOS (ABSOLUTE): 0.2 10*3/uL (ref 0.0–0.4)
Eos: 4 %
HEMATOCRIT: 34.9 % (ref 34.0–46.6)
HEMOGLOBIN: 12 g/dL (ref 11.1–15.9)
IMMATURE GRANS (ABS): 0 10*3/uL (ref 0.0–0.1)
Immature Granulocytes: 0 %
LYMPHS: 14 %
Lymphocytes Absolute: 0.8 10*3/uL (ref 0.7–3.1)
MCH: 28.7 pg (ref 26.6–33.0)
MCHC: 34.4 g/dL (ref 31.5–35.7)
MCV: 84 fL (ref 79–97)
MONOCYTES: 7 %
Monocytes Absolute: 0.4 10*3/uL (ref 0.1–0.9)
NEUTROS ABS: 4.3 10*3/uL (ref 1.4–7.0)
Neutrophils: 75 %
Platelets: 337 10*3/uL (ref 150–450)
RBC: 4.18 x10E6/uL (ref 3.77–5.28)
RDW: 14.2 % (ref 12.3–15.4)
WBC: 5.8 10*3/uL (ref 3.4–10.8)

## 2017-12-04 LAB — TSH: TSH: 4.65 u[IU]/mL — ABNORMAL HIGH (ref 0.450–4.500)

## 2017-12-06 ENCOUNTER — Telehealth: Payer: Self-pay | Admitting: Family Medicine

## 2017-12-07 ENCOUNTER — Other Ambulatory Visit: Payer: Self-pay

## 2017-12-07 MED ORDER — LEVOTHYROXINE SODIUM 125 MCG PO TABS: 125.0000 ug | ORAL_TABLET | Freq: Every day | ORAL | 0 refills | Status: DC

## 2017-12-07 NOTE — Telephone Encounter (Signed)
Patient aware of lab results.

## 2017-12-08 ENCOUNTER — Ambulatory Visit (HOSPITAL_COMMUNITY)
Admission: RE | Admit: 2017-12-08 | Discharge: 2017-12-08 | Disposition: A | Discharge status: Home / Self Care | Payer: Self-pay | Source: Ambulatory Visit | Attending: Oncology | Admitting: Oncology

## 2017-12-08 DIAGNOSIS — C20 Malignant neoplasm of rectum: Secondary | ICD-10-CM | POA: Insufficient documentation

## 2017-12-08 MED ORDER — IOPAMIDOL (ISOVUE-300) INJECTION 61%
100.0000 mL | Freq: Once | INTRAVENOUS | Status: AC | PRN
Start: 2017-12-08 — End: 2017-12-08
  Administered 2017-12-08: 100 mL via INTRAVENOUS

## 2017-12-08 MED ORDER — IOPAMIDOL (ISOVUE-300) INJECTION 61%
INTRAVENOUS | Status: AC
  Filled 2017-12-08: qty 100

## 2017-12-10 ENCOUNTER — Telehealth: Payer: Self-pay | Admitting: Nurse Practitioner

## 2017-12-10 ENCOUNTER — Inpatient Hospital Stay (HOSPITAL_BASED_OUTPATIENT_CLINIC_OR_DEPARTMENT_OTHER): Payer: Self-pay | Admitting: Nurse Practitioner

## 2017-12-10 ENCOUNTER — Encounter: Payer: Self-pay | Admitting: Nurse Practitioner

## 2017-12-10 VITALS — BP 137/71 | HR 71 | Temp 97.6°F | Resp 18 | Ht 61.0 in | Wt 227.8 lb

## 2017-12-10 DIAGNOSIS — I10 Essential (primary) hypertension: Secondary | ICD-10-CM

## 2017-12-10 DIAGNOSIS — C20 Malignant neoplasm of rectum: Secondary | ICD-10-CM

## 2017-12-10 NOTE — Progress Notes (Signed)
  [facility] OFFICE PROGRESS NOTE   Diagnosis: Rectal cancer  INTERVAL HISTORY:   Destiny [name] returns for follow-up.  Bowel habits continue to be erratic.  Thus far today she has had no bowel movements.  Some days that she has as many as 20 bowel movements.  She is taking Lomotil and Imodium.  She noted some improvement in bowel habits when she was participating in the pelvic physical therapy program.  Unfortunately due to loss of health insurance she was unable to continue this.  She periodically notes blood on the toilet tissue after a bout of frequent bowel movements.  She describes her appetite as "so-so".  She is gaining weight.  Objective:  Vital signs in last 24 hours:  Blood pressure 137/71, pulse 71, temperature 97.6 F (36.4 C), temperature source Oral, resp. rate 18, height 5' 1" (1.549 m), weight 227 lb 12.8 oz (103.3 kg), SpO2 99 %.    HEENT: Neck without mass. Lymphatics: No palpable cervical, supraclavicular, axillary or inguinal lymph nodes. Resp: Lungs clear bilaterally. Cardio: Regular rate and rhythm. GI: Abdomen soft and nontender.  No hepatomegaly.  No mass. Vascular: No leg edema.    Lab Results:  Lab Results  Component Value Date   WBC 5.8 12/03/2017   HGB 12.0 12/03/2017   HCT 34.9 12/03/2017   MCV 84 12/03/2017   PLT 337 12/03/2017   NEUTROABS 4.3 12/03/2017    Imaging:  No results found.  Medications: I have reviewed the patient's current medications.  Assessment/Plan: 1. Rectal cancer-clinical stage III (T3 N1)  No loss of mismatch repair protein expression  T3 N1 tumor beginning at 2 cm from the anal verge on an MRI of the pelvis 11/08/2015  CTs of the chest, abdomen, and pelvis 11/01/2015 with no evidence of distant metastatic disease  Initiation of neoadjuvant radiation/Xeloda 11/25/2015; completion of radiation/Xeloda 01/02/2016  Low anterior resection with a coloanal anastomosis 02/26/2016,ypT2,ypN0. Grade 3  adenocarcinoma, negative resection margins09/27/2017  No BRAF, KRAS, NRAS mutation; Microsatellite status could not be determined  CT abdomen/pelvis 03/30/2016-no evidence of recurrent rectal cancer  Cycle 1 adjuvant Xeloda 04/14/2016  Cycle 2 adjuvant Xeloda 05/08/2016  Cycle 3 adjuvant Xeloda 05/29/2016  Cycle 4 adjuvant Xeloda 06/20/2016 (completed approximately 10 days)  Cycle 5 adjuvant Xeloda started approximately 07/19/2016  CT 12/04/2016-no evidence of metastatic disease  CT 12/08/2017- no evidence of metastatic disease  2. History of hypertension  3. Left ureteral injury at the low anterior resection-status post a left ureteral neocystostomy and left double-J stent placement 02/26/2016  4. Hyperthyroidism status post radioactive iodine 05/12/2016  5.Ileostomy reversal 09/02/2016  6.Hospitalization 10/20/2016 through 10/22/2016 with UTI/sepsis.     Disposition: Destiny [name] remains in clinical remission from rectal cancer.  The CEA from earlier this month was in normal range and recent restaging CTs show no evidence of recurrent/metastatic disease.  She will return for a CEA and a follow-up visit in 6 months.  She will contact the office in the interim with any problems.  I encouraged her to contact us if she is able to obtain health insurance and we will refer her back to the pelvic physical therapy program.    Ned Card ANP/GNP-BC   12/10/2017  2:12 PM

## 2017-12-10 NOTE — Telephone Encounter (Signed)
Gave pt avs and calendar with appts per 7/12 los.

## 2017-12-27 IMAGING — CT CT ABD-PELV W/ CM
2 of 5 series · 12 of 36 positions shown, 15 images · IV contrast (iopamidol)
Comparison: 03/30/2016 abdomen and pelvis CT.  Chest CT 11/01/2015.

CLINICAL DATA: Rectal cancer

EXAM:
CT CHEST, ABDOMEN, AND PELVIS WITH CONTRAST
TECHNIQUE: Multidetector CT imaging of the chest, abdomen and pelvis was
performed following the standard protocol during bolus
administration of intravenous contrast.
CONTRAST:  100mL KCZAT0-VGG IOPAMIDOL (KCZAT0-VGG) INJECTION 61%

[Series 2: cap with · axial · 0.89mm/px · z∈[-683,-163]mm · 9 of 131 slices shown, 12 images]
[im 14/131  mediastinal]
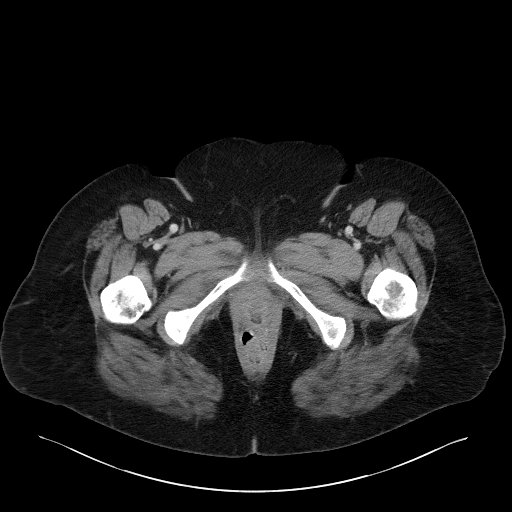
[im 14/131  lung]
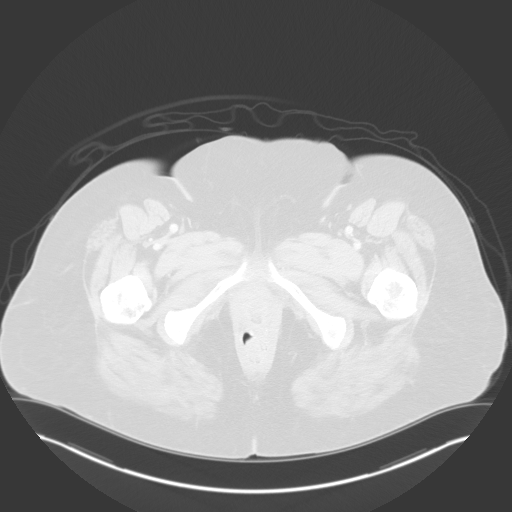
[im 27/131  lung]
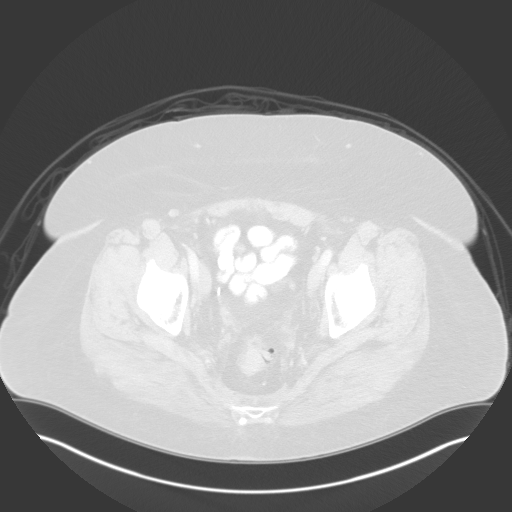
[im 40/131  lung]
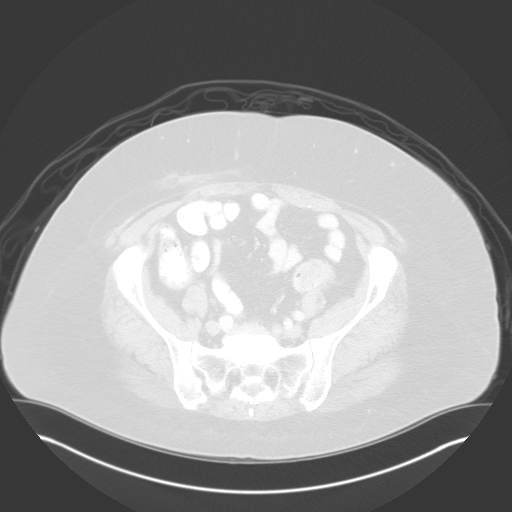
[im 53/131  lung]
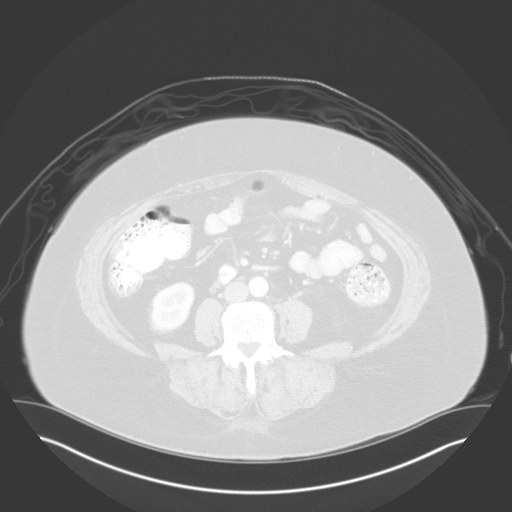
[im 66/131  mediastinal]
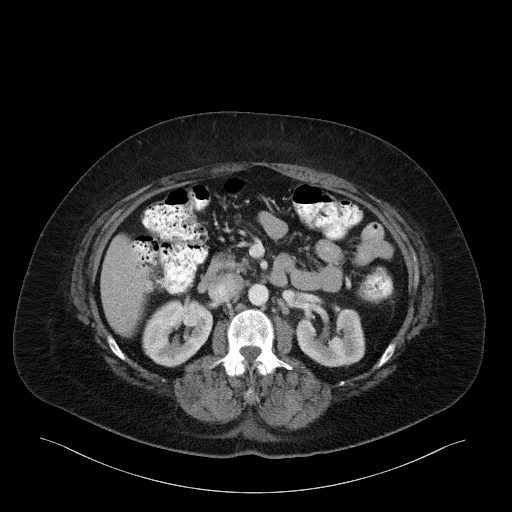
[im 66/131  lung]
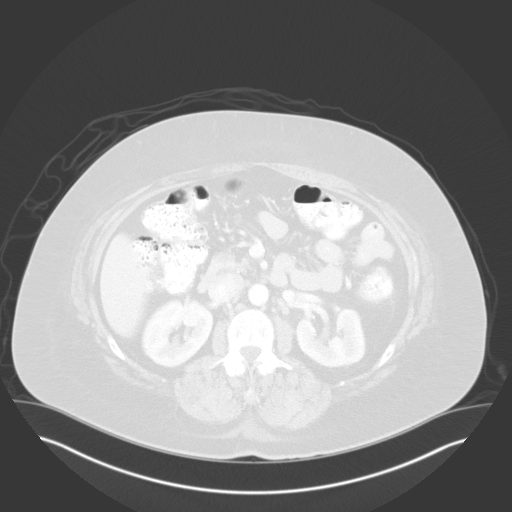
[im 79/131  lung]
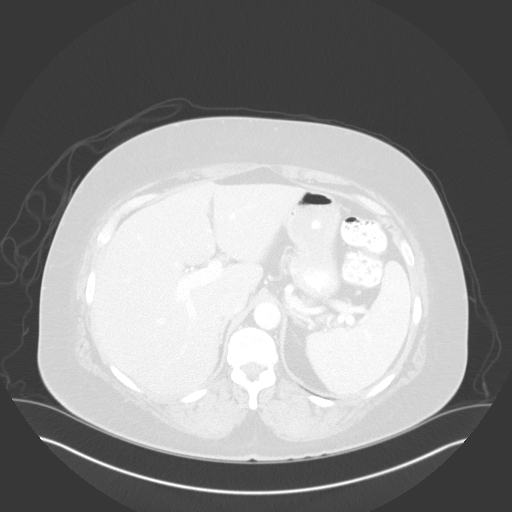
[im 92/131  lung]
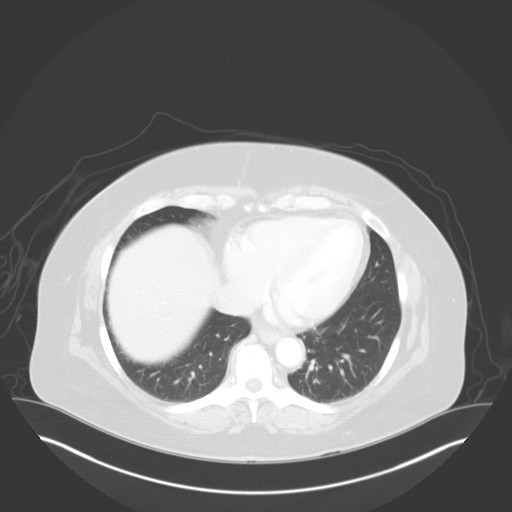
[im 105/131  lung]
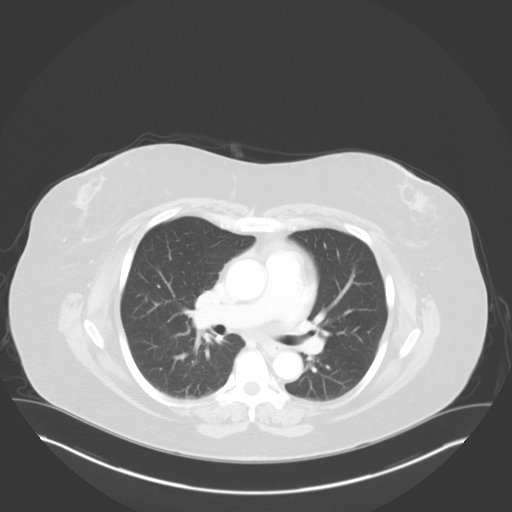
[im 118/131  mediastinal]
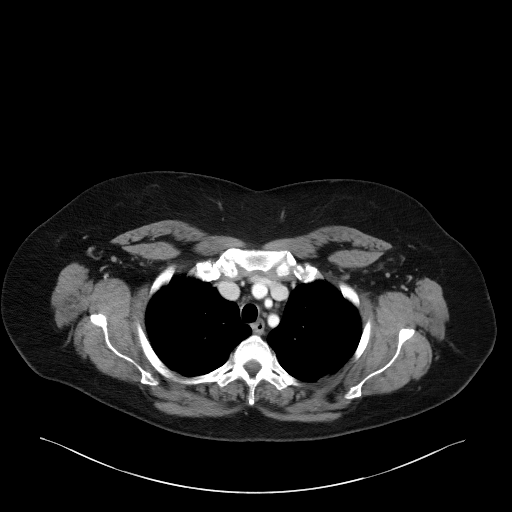
[im 118/131  lung]
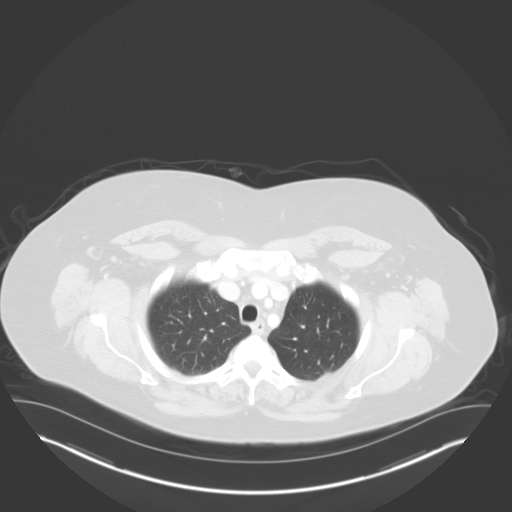

[Series 6: coronals · coronal · 0.82mm/px · 3 of 174 slices shown]
[im 35/174  lung]
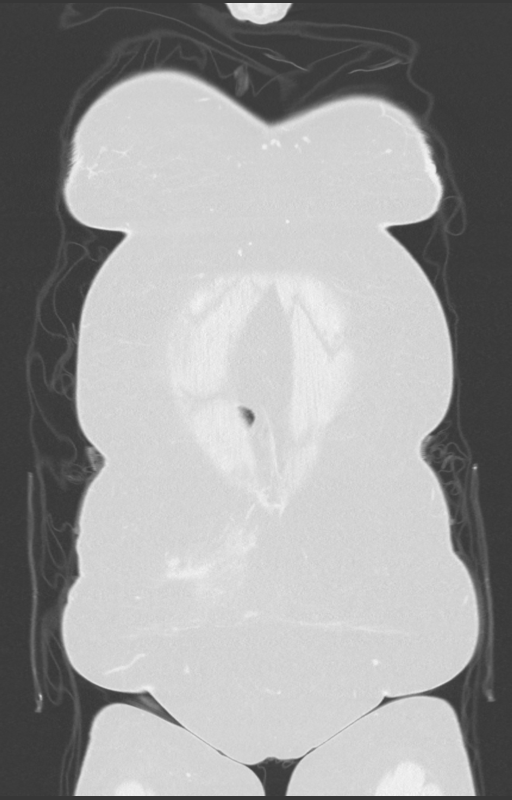
[im 70/174  lung]
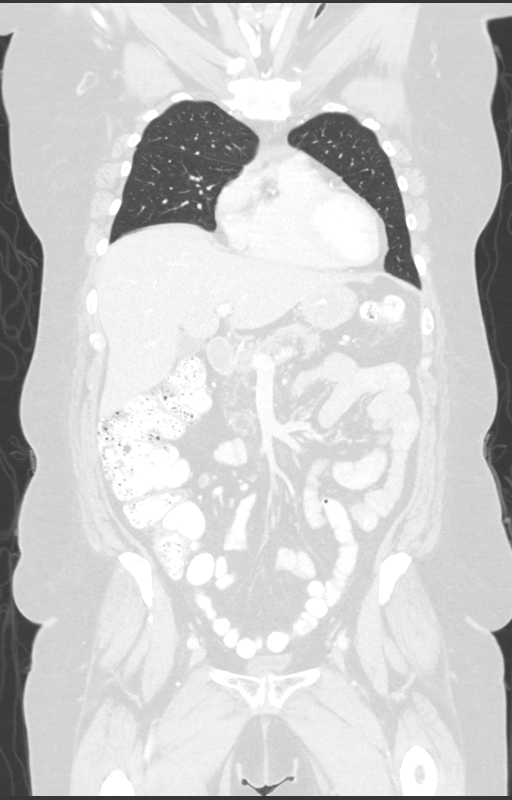
[im 104/174  lung]
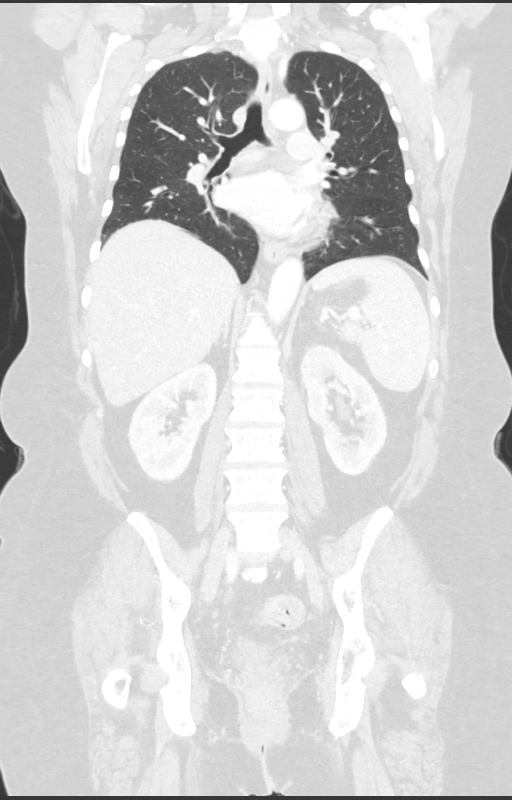

[12 of 36 positions shown; findings below may reference images not displayed]

FINDINGS: CT CHEST FINDINGS

Cardiovascular: The heart size is normal.  No pericardial effusion.

Mediastinum/Nodes: No mediastinal lymphadenopathy. There is no hilar
lymphadenopathy. The esophagus has normal imaging features. There is
no axillary lymphadenopathy.

Lungs/Pleura: No focal airspace consolidation. No pulmonary edema or
pleural effusion. No suspicious pulmonary nodule or mass.

Musculoskeletal: Bone windows reveal no worrisome lytic or sclerotic
osseous lesions.

CT ABDOMEN PELVIS FINDINGS

Hepatobiliary: No focal abnormality within the liver parenchyma.
There is no evidence for gallstones, gallbladder wall thickening, or
pericholecystic fluid. No intrahepatic or extrahepatic biliary
dilation.

Pancreas: No focal mass lesion. No dilatation of the main duct. No
intraparenchymal cyst. No peripancreatic edema.

Spleen: No splenomegaly. No focal mass lesion.

Adrenals/Urinary Tract: No adrenal nodule or mass. Kidneys are
unremarkable. No evidence for hydroureter. The urinary bladder
appears normal for the degree of distention.

Stomach/Bowel: Tiny hiatal hernia. Stomach otherwise unremarkable.
Duodenum is normally positioned as is the ligament of Treitz. No
small bowel wall thickening. No small bowel dilatation. The terminal
ileum is normal. The appendix is not visualized, but there is no
edema or inflammation in the region of the cecum. Distal rectal
suture line noted.

Vascular/Lymphatic: No abdominal aortic aneurysm. No abdominal
aortic atherosclerotic calcification. Small retroperitoneal lymph
nodes are evident, but no lymphadenopathy in the abdomen by size
criteria. No pelvic sidewall lymphadenopathy.

Reproductive: Uterus surgically absent.  There is no adnexal mass.

Other: No intraperitoneal free fluid. Small 14 mm gas pocket is
identified in the perianal region, just between the anus and the
vagina. This does not appear to be gas in the introitus no in the
vagina. It may be related to some redundancy in the with wall the
anastomosis. There is no associated edema or inflammatory change.
There is no associated fluid collection or abscess. Mild prominence
presacral soft tissue is similar to prior and likely treatment
related.

Musculoskeletal: Bone windows reveal no worrisome lytic or sclerotic
osseous lesions.
IMPRESSION: 1. No evidence for metastatic disease in the chest.
2. No evidence for metastatic disease in the abdomen or pelvis.
3. Interval ileostomy takedown. Patient is noted to have a tiny gas
pocket adjacent to the anus on today's study. This is immediately
adjacent to clips/suture related to the distal anastomosis and may
be gas within a fold related to the anastomosis itself. There is no
associated edema or inflammation. No focal fluid collection to
suggest abscess. Attention on follow-up recommended.
4. Tiny hiatal hernia.

## 2018-01-08 ENCOUNTER — Other Ambulatory Visit: Payer: Self-pay | Admitting: Family Medicine

## 2018-03-09 ENCOUNTER — Ambulatory Visit: Payer: Self-pay | Admitting: Family Medicine

## 2018-03-12 ENCOUNTER — Other Ambulatory Visit: Payer: Self-pay | Admitting: Family Medicine

## 2018-03-12 DIAGNOSIS — L2489 Irritant contact dermatitis due to other agents: Secondary | ICD-10-CM

## 2018-03-18 ENCOUNTER — Encounter: Payer: Self-pay | Admitting: Family Medicine

## 2018-03-18 ENCOUNTER — Ambulatory Visit: Payer: Medicaid Other | Admitting: Family Medicine

## 2018-03-18 VITALS — BP 127/74 | HR 68 | Temp 96.8°F | Ht 61.0 in | Wt 229.0 lb

## 2018-03-18 DIAGNOSIS — Z23 Encounter for immunization: Secondary | ICD-10-CM

## 2018-03-18 DIAGNOSIS — E890 Postprocedural hypothyroidism: Secondary | ICD-10-CM | POA: Diagnosis not present

## 2018-03-18 NOTE — Progress Notes (Signed)
BP 127/74   Pulse 68   Temp (!) 96.8 F (36 C) (Oral)   Ht 5\' 1"  (1.549 m)   Wt 229 lb (103.9 kg)   LMP  (LMP Unknown) Comment: FULL   BMI 43.27 kg/m    Subjective:    Patient ID: Destiny [name], female    DOB: 1965-06-08, 52 y.o.   MRN: [id number]  HPI: Destiny [name] is a 52 y.o. female presenting on 03/18/2018 for Medical Management of Chronic Issues   HPI Hypothyroidism recheck Patient is coming in for thyroid recheck today as well. They deny any issues with hair changes or heat or cold problems or diarrhea or constipation. They deny any chest pain or palpitations. They are currently on levothyroxine 170micrograms   Relevant past medical, surgical, family and social history reviewed and updated as indicated. Interim medical history since our last visit reviewed. Allergies and medications reviewed and updated.  Review of Systems  Constitutional: Negative for chills and fever.  Eyes: Negative for redness and visual disturbance.  Respiratory: Negative for chest tightness and shortness of breath.   Cardiovascular: Negative for chest pain and leg swelling.  Musculoskeletal: Negative for back pain and gait problem.  Neurological: Negative for light-headedness and headaches.  Psychiatric/Behavioral: Negative for agitation and behavioral problems. The patient is not nervous/anxious.   All other systems reviewed and are negative.   Per HPI unless specifically indicated above   Allergies as of 03/18/2018      Reactions   Latex Itching, Rash      Medication List        Accurate as of 03/18/18  2:06 PM. Always use your most recent med list.          acetaminophen 500 MG tablet Commonly known as:  TYLENOL Take 1,000 mg by mouth every 8 (eight) hours as needed for moderate pain.   colestipol 1 g tablet Commonly known as:  COLESTID Take by mouth.   diphenoxylate-atropine 2.5-0.025 MG tablet Commonly known as:  LOMOTIL TAKE (2) TABLETS FOUR TIMES DAILY.   fluconazole  200 MG tablet Commonly known as:  DIFLUCAN TAKE 1 TABLET DAILY   gabapentin 300 MG capsule Commonly known as:  NEURONTIN 1 CAPSULE TODAY, TWICE A DAY FRIDAY, THEN 3 TIMES A DAY FOR 30 DAYS   hydrochlorothiazide 25 MG tablet Commonly known as:  HYDRODIURIL Take 1 tablet (25 mg total) by mouth daily.   levothyroxine 125 MCG tablet Commonly known as:  SYNTHROID, LEVOTHROID TAKE 1 TABLET DAILY   LORazepam 0.5 MG tablet Commonly known as:  ATIVAN TAKE  (1)  TABLET TWICE A DAY.   nystatin powder Commonly known as:  MYCOSTATIN/NYSTOP Apply topically 4 (four) times daily.   ondansetron 8 MG tablet Commonly known as:  ZOFRAN Take 1 tablet by mouth every 8 hours as needed for nausea or vomiting.   sertraline 100 MG tablet Commonly known as:  ZOLOFT Take 1 tablet (100 mg total) by mouth daily.   vitamin C 500 MG tablet Commonly known as:  ASCORBIC ACID Take 500 mg by mouth daily.          Objective:    BP 127/74   Pulse 68   Temp (!) 96.8 F (36 C) (Oral)   Ht 5\' 1"  (1.549 m)   Wt 229 lb (103.9 kg)   LMP  (LMP Unknown) Comment: FULL   BMI 43.27 kg/m   Wt Readings from Last 3 Encounters:  03/18/18 229 lb (103.9 kg)  12/10/17 227  lb 12.8 oz (103.3 kg)  12/03/17 228 lb 6.4 oz (103.6 kg)    Physical Exam  Constitutional: She is oriented to person, place, and time. She appears well-developed and well-nourished. No distress.  Eyes: Conjunctivae are normal.  Cardiovascular: Normal rate, regular rhythm, normal heart sounds and intact distal pulses.  No murmur heard. Pulmonary/Chest: Effort normal and breath sounds normal. No respiratory distress. She has no wheezes.  Neurological: She is alert and oriented to person, place, and time. Coordination normal.  Skin: Skin is warm and dry. No rash noted. She is not diaphoretic.  Psychiatric: She has a normal mood and affect. Her behavior is normal.  Nursing note and vitals reviewed.     Assessment & Plan:   Problem List  Items Addressed This Visit      Endocrine   Hypothyroidism - Primary   Relevant Orders   TSH (Completed)     Other   Morbid obesity ([place])    Other Visit Diagnoses    Need for immunization against influenza       Relevant Orders   Flu Vaccine QUAD 36+ mos IM (Completed)      Continue current thyroid dose and recheck labs today.  Follow up plan: Return in about 3 months (around 06/18/2018), or if symptoms worsen or fail to improve, for Thyroid.  Counseling provided for all of the vaccine components Orders Placed This Encounter  Procedures  . TSH    Caryl Pina, MD [facility] 03/18/2018, 2:06 PM

## 2018-03-19 LAB — TSH: TSH: 3.82 u[IU]/mL (ref 0.450–4.500)

## 2018-03-24 ENCOUNTER — Encounter: Payer: Self-pay | Admitting: Family Medicine

## 2018-03-24 ENCOUNTER — Other Ambulatory Visit: Payer: Self-pay | Admitting: Family Medicine

## 2018-03-25 ENCOUNTER — Other Ambulatory Visit: Payer: Self-pay | Admitting: *Deleted

## 2018-03-25 DIAGNOSIS — L2489 Irritant contact dermatitis due to other agents: Secondary | ICD-10-CM

## 2018-03-25 MED ORDER — NYSTATIN 100000 UNIT/GM EX POWD: Freq: Four times a day (QID) | CUTANEOUS | 1 refills | Status: DC

## 2018-03-25 MED ORDER — GABAPENTIN 300 MG PO CAPS: 300.0000 mg | ORAL_CAPSULE | Freq: Three times a day (TID) | ORAL | 0 refills | Status: DC

## 2018-03-25 NOTE — Telephone Encounter (Signed)
Last seen 10/18, last filled 1/19

## 2018-03-25 NOTE — Telephone Encounter (Signed)
Last refill without being seen 

## 2018-03-25 NOTE — Telephone Encounter (Signed)
Patient aware.

## 2018-03-28 MED ORDER — GABAPENTIN 300 MG PO CAPS: 300.0000 mg | ORAL_CAPSULE | Freq: Three times a day (TID) | ORAL | 1 refills | Status: DC

## 2018-05-12 ENCOUNTER — Other Ambulatory Visit: Payer: Self-pay | Admitting: *Deleted

## 2018-06-10 ENCOUNTER — Ambulatory Visit (INDEPENDENT_AMBULATORY_CARE_PROVIDER_SITE_OTHER): Payer: Medicaid Other | Admitting: Physician Assistant

## 2018-06-10 ENCOUNTER — Encounter: Payer: Self-pay | Admitting: Physician Assistant

## 2018-06-10 ENCOUNTER — Telehealth: Payer: Self-pay | Admitting: Family

## 2018-06-10 VITALS — BP 107/68 | HR 74 | Temp 97.3°F | Ht 61.0 in | Wt 226.4 lb

## 2018-06-10 DIAGNOSIS — J00 Acute nasopharyngitis [common cold]: Secondary | ICD-10-CM

## 2018-06-10 DIAGNOSIS — J0110 Acute frontal sinusitis, unspecified: Secondary | ICD-10-CM

## 2018-06-10 MED ORDER — FLUTICASONE PROPIONATE 50 MCG/ACT NA SUSP: 1.0000 | Freq: Two times a day (BID) | NASAL | 2 refills | Status: DC

## 2018-06-10 MED ORDER — AZITHROMYCIN 250 MG PO TABS: ORAL_TABLET | ORAL | 0 refills | Status: DC

## 2018-06-10 MED ORDER — METHYLPREDNISOLONE ACETATE 80 MG/ML IJ SUSP
80.0000 mg | Freq: Once | INTRAMUSCULAR | Status: AC
  Administered 2018-06-10: 80 mg via INTRAMUSCULAR

## 2018-06-10 MED ORDER — BENZONATATE 100 MG PO CAPS: 100.0000 mg | ORAL_CAPSULE | Freq: Three times a day (TID) | ORAL | 0 refills | Status: DC | PRN

## 2018-06-10 NOTE — Progress Notes (Signed)
BP 107/68   Pulse 74   Temp (!) 97.3 F (36.3 C) (Oral)   Ht 5\' 1"  (1.549 m)   Wt 226 lb 6.4 oz (102.7 kg)   LMP  (LMP Unknown) Comment: FULL   SpO2 97%   BMI 42.78 kg/m    Subjective:    Patient ID: Destiny [name], female    DOB: Dec 16, 1965, 53 y.o.   MRN: [id number]  HPI: Destiny [name] is a 54 y.o. female presenting on 06/10/2018 for Hoarse (x 1 week otc mucinex); Headache; and Cough This patient has had many days of sore throat and postnasal drainage, headache at times and sinus pressure. There is copious drainage at times. Denies any fever at this time. There has been a history of sinus infections in the past.  There is cough at night. It has become more prevalent in recent days.    Past Medical History:  Diagnosis Date  . Cancer ([place]) 10/29/15   rectal invasice adenocarcinoma   . Chronic back pain   . Depression   . Hypertension   . Hyperthyroidism   . Rectal cancer ([place])    Adenocarcinoma dx 10/29/15; Stage III, T3, N1, [place]   Relevant past medical, surgical, family and social history reviewed and updated as indicated. Interim medical history since our last visit reviewed. Allergies and medications reviewed and updated. DATA REVIEWED: CHART IN EPIC  Family History reviewed for pertinent findings.  Review of Systems  Constitutional: Positive for chills and fatigue. Negative for activity change, appetite change and fever.  HENT: Positive for congestion, postnasal drip, sinus pressure, sinus pain and sore throat.   Eyes: Negative.   Respiratory: Positive for cough. Negative for wheezing.   Cardiovascular: Negative.  Negative for chest pain, palpitations and leg swelling.  Gastrointestinal: Negative.   Genitourinary: Negative.   Musculoskeletal: Negative.   Skin: Negative.   Neurological: Positive for headaches.    Allergies as of 06/10/2018      Reactions   Latex Itching, Rash      Medication List       Accurate as of June 10, 2018  3:44 PM. Always use your  most recent med list.        acetaminophen 500 MG tablet Commonly known as:  TYLENOL Take 1,000 mg by mouth every 8 (eight) hours as needed for moderate pain.   azithromycin 250 MG tablet Commonly known as:  ZITHROMAX Z-PAK Take as directed   colestipol 1 g tablet Commonly known as:  COLESTID Take by mouth.   diphenoxylate-atropine 2.5-0.025 MG tablet Commonly known as:  LOMOTIL TAKE (2) TABLETS FOUR TIMES DAILY.   fluticasone 50 MCG/ACT nasal spray Commonly known as:  FLONASE Place 1 spray into both nostrils 2 (two) times daily.   gabapentin 300 MG capsule Commonly known as:  NEURONTIN Take 1 capsule (300 mg total) by mouth 3 (three) times daily.   hydrochlorothiazide 25 MG tablet Commonly known as:  HYDRODIURIL Take 1 tablet (25 mg total) by mouth daily.   levothyroxine 125 MCG tablet Commonly known as:  SYNTHROID, LEVOTHROID TAKE 1 TABLET DAILY   LORazepam 0.5 MG tablet Commonly known as:  ATIVAN TAKE  (1)  TABLET TWICE A DAY.   nystatin powder Commonly known as:  MYCOSTATIN/NYSTOP Apply topically 4 (four) times daily.   ondansetron 8 MG tablet Commonly known as:  ZOFRAN Take 1 tablet by mouth every 8 hours as needed for nausea or vomiting.   sertraline 100 MG tablet Commonly known as:  ZOLOFT Take 1 tablet (100 mg total) by mouth daily.   vitamin C 500 MG tablet Commonly known as:  ASCORBIC ACID Take 500 mg by mouth daily.          Objective:    BP 107/68   Pulse 74   Temp (!) 97.3 F (36.3 C) (Oral)   Ht 5\' 1"  (1.549 m)   Wt 226 lb 6.4 oz (102.7 kg)   LMP  (LMP Unknown) Comment: FULL   SpO2 97%   BMI 42.78 kg/m   Allergies  Allergen Reactions  . Latex Itching and Rash    Wt Readings from Last 3 Encounters:  06/10/18 226 lb 6.4 oz (102.7 kg)  03/18/18 229 lb (103.9 kg)  12/10/17 227 lb 12.8 oz (103.3 kg)    Physical Exam Constitutional:      Appearance: She is well-developed.  HENT:     Head: Normocephalic and atraumatic.      Right Ear: Tympanic membrane and external ear normal. No middle ear effusion.     Left Ear: Tympanic membrane and external ear normal.  No middle ear effusion.     Nose: Mucosal edema and rhinorrhea present.     Right Sinus: No maxillary sinus tenderness.     Left Sinus: No maxillary sinus tenderness.     Mouth/Throat:     Pharynx: Uvula midline. Posterior oropharyngeal erythema present.  Eyes:     General:        Right eye: No discharge.        Left eye: No discharge.     Conjunctiva/sclera: Conjunctivae normal.     Pupils: Pupils are equal, round, and reactive to light.  Neck:     Musculoskeletal: Normal range of motion.  Cardiovascular:     Rate and Rhythm: Normal rate and regular rhythm.     Heart sounds: Normal heart sounds.  Pulmonary:     Effort: Pulmonary effort is normal. No respiratory distress.     Breath sounds: Normal breath sounds. No wheezing.  Abdominal:     Palpations: Abdomen is soft.  Lymphadenopathy:     Cervical: No cervical adenopathy.  Skin:    General: Skin is warm and dry.  Neurological:     Mental Status: She is alert and oriented to person, place, and time.     Results for orders placed or performed in visit on 03/18/18  TSH  Result Value Ref Range   TSH 3.820 0.450 - 4.500 uIU/mL      Assessment & Plan:   1. Acute non-recurrent frontal sinusitis - azithromycin (ZITHROMAX Z-PAK) 250 MG tablet; Take as directed  Dispense: 6 each; Refill: 0 - methylPREDNISolone acetate (DEPO-MEDROL) injection 80 mg   Continue all other maintenance medications as listed above.  Follow up plan: No follow-ups on file.  Educational handout given ffor [place] PA-C [place] [address]  [place], [place] [address] [phone]   06/10/2018, 3:44 PM

## 2018-06-10 NOTE — Patient Instructions (Signed)

## 2018-06-10 NOTE — Progress Notes (Signed)
Thank you for the details you included in the comment boxes. Those details are very helpful in determining the best course of treatment for you and help [place] to provide the best care.  We are sorry you are not feeling well.  Here is how we plan to help!  Based on what you have shared with me, it looks like you may have a viral upper respiratory infection or a "common cold".  Colds are caused by a large number of viruses; however, rhinovirus is the most common cause.   Symptoms of the common cold vary from person to person, with common symptoms including sore throat, cough, and malaise.  A low-grade fever of 100.4 may present, but is often uncommon.  Symptoms vary however, and are closely related to a person's age or underlying illnesses.  The most common symptoms associated with the common cold are nasal discharge or congestion, cough, sneezing, headache and pressure in the ears and face.  Cold symptoms usually persist for about 3 to 10 days, but can last up to 2 weeks.  It is important to know that colds do not cause serious illness or complications in most cases.    The common cold is transmitted from person to person, with the most common method of transmission being a person's hands.  The virus is able to live on the skin and can infect other persons for up to 2 hours after direct contact.  Also, colds are transmitted when someone coughs or sneezes; thus, it is important to cover the mouth to reduce this risk.  To keep the spread of the common cold at [place], good hand hygiene is very important.  This is an infection that is most likely caused by a virus. There are no specific treatments for the common cold other than to help you with the symptoms until the infection runs its course.    For nasal congestion, you may use an oral decongestants such as Mucinex D or if you have glaucoma or high blood pressure use plain Mucinex.  Saline nasal spray or nasal drops can help and can safely be used as often as  needed for congestion.  For your congestion, I have prescribed Fluticasone nasal spray one spray in each nostril twice a day  If you do not have a history of heart disease, hypertension, diabetes or thyroid disease, prostate/bladder issues or glaucoma, you may also use Sudafed to treat nasal congestion.  It is highly recommended that you consult with a pharmacist or your primary care physician to ensure this medication is safe for you to take.     If you have a cough, you may use cough suppressants such as Delsym and Robitussin.  If you have glaucoma or high blood pressure, you can also use Coricidin HBP.   For cough I have prescribed for you A prescription cough medication called Tessalon Perles 100 mg. You may take 1-2 capsules every 8 hours as needed for cough  If you have a sore or scratchy throat, use a saltwater gargle-  to  teaspoon of salt dissolved in a 4-ounce to 8-ounce glass of warm water.  Gargle the solution for approximately 15-30 seconds and then spit.  It is important not to swallow the solution.  You can also use throat lozenges/cough drops and Chloraseptic spray to help with throat pain or discomfort.  Warm or cold liquids can also be helpful in relieving throat pain.  For headache, pain or general discomfort, you can use Ibuprofen or Tylenol  as directed.   Some authorities believe that zinc sprays or the use of Echinacea may shorten the course of your symptoms.   HOME CARE . Only take medications as instructed by your medical team. . Be sure to drink plenty of fluids. Water is fine as well as fruit juices, sodas and electrolyte beverages. You may want to stay away from caffeine or alcohol. If you are nauseated, try taking small sips of liquids. How do you know if you are getting enough fluid? Your urine should be a pale yellow or almost colorless. . Get rest. . Taking a steamy shower or using a humidifier may help nasal congestion and ease sore throat pain. You can place a  towel over your head and breathe in the steam from hot water coming from a faucet. . Using a saline nasal spray works much the same way. . Cough drops, hard candies and sore throat lozenges may ease your cough. . Avoid close contacts especially the very young and the elderly . Cover your mouth if you cough or sneeze . Always remember to wash your hands.   GET HELP RIGHT AWAY IF: . You develop worsening fever. . If your symptoms do not improve within 10 days . You develop yellow or green discharge from your nose over 3 days. . You have coughing fits . You develop a severe head ache or visual changes. . You develop shortness of breath, difficulty breathing or start having chest pain . Your symptoms persist after you have completed your treatment plan  MAKE SURE YOU   Understand these instructions.  Will watch your condition.  Will get help right away if you are not doing well or get worse.  Your e-visit answers were reviewed by a board certified advanced clinical practitioner to complete your personal care plan. Depending upon the condition, your plan could have included both over the counter or prescription medications. Please review your pharmacy choice. If there is a problem, you may call our nursing hot line at and have the prescription routed to another pharmacy. Your safety is important to [place]. If you have drug allergies check your prescription carefully.   You can use MyChart to ask questions about today's visit, request a non-urgent call back, or ask for a work or school excuse for 24 hours related to this e-Visit. If it has been greater than 24 hours you will need to follow up with your provider, or enter a new e-Visit to address those concerns. You will get an e-mail in the next two days asking about your experience.  I hope that your e-visit has been valuable and will speed your recovery. Thank you for using e-visits.

## 2018-06-13 ENCOUNTER — Inpatient Hospital Stay: Payer: Medicaid Other | Attending: Oncology | Admitting: Oncology

## 2018-06-13 ENCOUNTER — Inpatient Hospital Stay: Payer: Medicaid Other

## 2018-06-13 ENCOUNTER — Encounter: Payer: Self-pay | Admitting: Nurse Practitioner

## 2018-06-13 VITALS — BP 114/65 | HR 74 | Temp 98.5°F | Resp 18 | Ht 61.0 in | Wt 225.0 lb

## 2018-06-13 DIAGNOSIS — C20 Malignant neoplasm of rectum: Secondary | ICD-10-CM

## 2018-06-13 DIAGNOSIS — I10 Essential (primary) hypertension: Secondary | ICD-10-CM | POA: Diagnosis not present

## 2018-06-13 LAB — CEA (IN HOUSE-CHCC): CEA (CHCC-In House): 1.6 ng/mL (ref 0.00–5.00)

## 2018-06-13 NOTE — Progress Notes (Signed)
  Destiny [name] OFFICE PROGRESS NOTE   Diagnosis: Rectal cancer  INTERVAL HISTORY:   Destiny [name] returns as scheduled.  She reports a good appetite.  She developed a sinus infection 1 week ago.  She complains of malaise.  She has been hoarse for the past week.  No difficulty with bowel function.  Objective:  Vital signs in last 24 hours:  Blood pressure 114/65, pulse 74, temperature 98.5 F (36.9 C), temperature source Oral, resp. rate 18, height '5\' 1"'$  (1.549 m), weight 225 lb (102.1 kg), SpO2 100 %.    HEENT: Neck without mass Lymphatics: No cervical, supraclavicular, axillary, or inguinal nodes Resp: Lungs clear bilaterally Cardio: Regular rate and rhythm GI: No hepatosplenomegaly, no mass, nontender Vascular: No leg edema   Lab Results:    Lab Results  Component Value Date   CEA1 1.60 06/13/2018    Medications: I have reviewed the patient's current medications.   Assessment/Plan: 1. Rectal cancer-clinical stage III (T3 N1)  No loss of mismatch repair protein expression  T3 N1 tumor beginning at 2 cm from the anal verge on an MRI of the pelvis 11/08/2015  CTs of the chest, abdomen, and pelvis 11/01/2015 with no evidence of distant metastatic disease  Initiation of neoadjuvant radiation/Xeloda 11/25/2015; completion of radiation/Xeloda 01/02/2016  Low anterior resection with a coloanal anastomosis 02/26/2016,ypT2,ypN0. Grade 3 adenocarcinoma, negative resection margins09/27/2017  No BRAF, KRAS, NRAS mutation; Microsatellite status could not be determined  CT abdomen/pelvis 03/30/2016-no evidence of recurrent rectal cancer  Cycle 1 adjuvant Xeloda 04/14/2016  Cycle 2 adjuvant Xeloda 05/08/2016  Cycle 3 adjuvant Xeloda 05/29/2016  Cycle 4 adjuvant Xeloda 06/20/2016 (completed approximately 10 days)  Cycle 5 adjuvant Xeloda started approximately 07/19/2016  CT 12/04/2016-no evidence of metastatic disease  CT 12/08/2017- no evidence of metastatic  disease  2. History of hypertension  3. Left ureteral injury at the low anterior resection-status post a left ureteral neocystostomy and left double-J stent placement 02/26/2016  4. Hyperthyroidism status post radioactive iodine 05/12/2016  5.Ileostomy reversal 09/02/2016  6.Hospitalization 10/20/2016 through 10/22/2016 with UTI/sepsis.     Disposition: Destiny [name] is in clinical remission from rectal cancer.  She will return for an office visit, CEA, and restaging CTs in 6 months.  She will follow-up with her primary physician if the hoarseness does not resolve over the next few weeks.  15 minutes were spent with the patient today.  The majority of the time was used for counseling and coordination of care.  Betsy Coder, MD  06/13/2018  2:40 PM

## 2018-06-15 ENCOUNTER — Telehealth: Payer: Self-pay | Admitting: Oncology

## 2018-06-15 NOTE — Telephone Encounter (Signed)
Scheduled appt per 1/13 los - sent reminder letter in the mail with appt date and time - [facility] to contact patient with scan appt.

## 2018-08-01 ENCOUNTER — Telehealth: Payer: Self-pay | Admitting: *Deleted

## 2018-08-01 NOTE — Telephone Encounter (Signed)
On 08-01-18 fax medical records to [facility] [employer], it was consult note, end of tx note, sim and planning note, and follow up note if any

## 2018-08-02 ENCOUNTER — Telehealth: Payer: Self-pay | Admitting: *Deleted

## 2018-08-02 NOTE — Telephone Encounter (Signed)
Medical records faxed to [place]; [place] [phone]

## 2018-12-13 ENCOUNTER — Inpatient Hospital Stay: Payer: Medicaid Other | Attending: Oncology

## 2018-12-13 ENCOUNTER — Ambulatory Visit (HOSPITAL_COMMUNITY)
Admission: RE | Admit: 2018-12-13 | Discharge: 2018-12-13 | Disposition: A | Discharge status: Home / Self Care | Payer: Medicaid Other | Source: Ambulatory Visit | Attending: Oncology | Admitting: Oncology

## 2018-12-13 ENCOUNTER — Encounter (HOSPITAL_COMMUNITY): Payer: Self-pay

## 2018-12-13 ENCOUNTER — Other Ambulatory Visit: Payer: Self-pay

## 2018-12-13 DIAGNOSIS — R109 Unspecified abdominal pain: Secondary | ICD-10-CM | POA: Insufficient documentation

## 2018-12-13 DIAGNOSIS — Z85048 Personal history of other malignant neoplasm of rectum, rectosigmoid junction, and anus: Secondary | ICD-10-CM | POA: Insufficient documentation

## 2018-12-13 DIAGNOSIS — I10 Essential (primary) hypertension: Secondary | ICD-10-CM | POA: Insufficient documentation

## 2018-12-13 DIAGNOSIS — C20 Malignant neoplasm of rectum: Secondary | ICD-10-CM | POA: Insufficient documentation

## 2018-12-13 LAB — BASIC METABOLIC PANEL - CANCER CENTER ONLY
Anion gap: 10 (ref 5–15)
BUN: 16 mg/dL (ref 6–20)
CO2: 29 mmol/L (ref 22–32)
Calcium: 9.3 mg/dL (ref 8.9–10.3)
Chloride: 102 mmol/L (ref 98–111)
Creatinine: 0.86 mg/dL (ref 0.44–1.00)
GFR, Est AFR Am: >60 mL/min (ref >60)
GFR, Estimated: >60 mL/min (ref >60)
Glucose, Bld: 103 mg/dL — ABNORMAL HIGH (ref 70–99)
Potassium: 4 mmol/L (ref 3.5–5.1)
Sodium: 141 mmol/L (ref 135–145)

## 2018-12-13 LAB — CEA (IN HOUSE-CHCC): CEA (CHCC-In House): 1.25 ng/mL (ref 0.00–5.00)

## 2018-12-13 MED ORDER — SODIUM CHLORIDE (PF) 0.9 % IJ SOLN
INTRAMUSCULAR | Status: AC
  Filled 2018-12-13: qty 50

## 2018-12-13 MED ORDER — IOHEXOL 300 MG/ML  SOLN
100.0000 mL | Freq: Once | INTRAMUSCULAR | Status: AC | PRN
  Administered 2018-12-13: 12:00:00 100 mL via INTRAVENOUS

## 2018-12-15 ENCOUNTER — Inpatient Hospital Stay (HOSPITAL_BASED_OUTPATIENT_CLINIC_OR_DEPARTMENT_OTHER): Payer: Medicaid Other | Admitting: Nurse Practitioner

## 2018-12-15 ENCOUNTER — Encounter: Payer: Self-pay | Admitting: Nurse Practitioner

## 2018-12-15 ENCOUNTER — Other Ambulatory Visit: Payer: Self-pay

## 2018-12-15 VITALS — BP 114/67 | HR 80 | Temp 98.9°F | Resp 18 | Wt 225.2 lb

## 2018-12-15 DIAGNOSIS — I10 Essential (primary) hypertension: Secondary | ICD-10-CM

## 2018-12-15 DIAGNOSIS — R109 Unspecified abdominal pain: Secondary | ICD-10-CM

## 2018-12-15 DIAGNOSIS — Z85048 Personal history of other malignant neoplasm of rectum, rectosigmoid junction, and anus: Secondary | ICD-10-CM

## 2018-12-15 DIAGNOSIS — C20 Malignant neoplasm of rectum: Secondary | ICD-10-CM

## 2018-12-15 NOTE — Progress Notes (Signed)
  [facility] OFFICE PROGRESS NOTE   Diagnosis: Rectal cancer  INTERVAL HISTORY:   Destiny [name] returns as scheduled.  She overall feels well.  Bowels continue to be erratic.  No bleeding.  No pain with bowel movements.  She describes her appetite is "so-so".  Weight is stable.  She has occasional abdominal pain.  Objective:  Vital signs in last 24 hours:  Blood pressure 114/67, pulse 80, temperature 98.9 F (37.2 C), temperature source Oral, resp. rate 18, weight 225 lb 3 oz (102.1 kg), SpO2 100 %.    HEENT: Neck without mass. Lymphatics: No palpable cervical, supraclavicular, axillary or inguinal lymph nodes. Resp: Lungs clear bilaterally. Cardio: Regular rate and rhythm. GI: Abdomen soft and nontender.  No hepatomegaly. Vascular: No leg edema.   Lab Results:  Lab Results  Component Value Date   WBC 5.8 12/03/2017   HGB 12.0 12/03/2017   HCT 34.9 12/03/2017   MCV 84 12/03/2017   PLT 337 12/03/2017   NEUTROABS 4.3 12/03/2017    Imaging:  No results found.  Medications: I have reviewed the patient's current medications.  Assessment/Plan: 1. Rectal cancer-clinical stage III (T3 N1)  No loss of mismatch repair protein expression  T3 N1 tumor beginning at 2 cm from the anal verge on an MRI of the pelvis 11/08/2015  CTs of the chest, abdomen, and pelvis 11/01/2015 with no evidence of distant metastatic disease  Initiation of neoadjuvant radiation/Xeloda 11/25/2015; completion of radiation/Xeloda 01/02/2016  Low anterior resection with a coloanal anastomosis 02/26/2016,ypT2,ypN0. Grade 3 adenocarcinoma, negative resection margins09/27/2017  No BRAF, KRAS, NRAS mutation; Microsatellite status could not be determined  CT abdomen/pelvis 03/30/2016-no evidence of recurrent rectal cancer  Cycle 1 adjuvant Xeloda 04/14/2016  Cycle 2 adjuvant Xeloda 05/08/2016  Cycle 3 adjuvant Xeloda 05/29/2016  Cycle 4 adjuvant Xeloda 06/20/2016 (completed  approximately 10 days)  Cycle 5 adjuvant Xeloda started approximately 07/19/2016  CT 12/04/2016-no evidence of metastatic disease  CT 12/08/2017- no evidence of metastatic disease  CT 12/13/2018-no evidence of metastatic disease  2. History of hypertension  3. Left ureteral injury at the low anterior resection-status post a left ureteral neocystostomy and left double-J stent placement 02/26/2016  4. Hyperthyroidism status post radioactive iodine 05/12/2016  5.Ileostomy reversal 09/02/2016  6.Hospitalization 10/20/2016 through 10/22/2016 with UTI/sepsis.   Disposition: Destiny [name] remains in clinical remission from rectal cancer.  Recent CT scans show no evidence of metastatic disease.  She will return for a CEA and follow-up visit in 6 months.  She will contact the office in the interim with any problems.  Plan reviewed with Dr. Benay Spice.  Ned Card ANP/GNP-BC   12/15/2018  3:58 PM

## 2018-12-16 ENCOUNTER — Telehealth: Payer: Self-pay | Admitting: Oncology

## 2018-12-16 NOTE — Telephone Encounter (Signed)
Called and left msg for patient. Mailed printout  °

## 2019-01-01 ENCOUNTER — Other Ambulatory Visit: Payer: Self-pay | Admitting: Family Medicine

## 2019-01-01 DIAGNOSIS — F331 Major depressive disorder, recurrent, moderate: Secondary | ICD-10-CM

## 2019-01-09 ENCOUNTER — Ambulatory Visit (INDEPENDENT_AMBULATORY_CARE_PROVIDER_SITE_OTHER): Payer: Medicaid Other | Admitting: Family Medicine

## 2019-01-09 DIAGNOSIS — N3000 Acute cystitis without hematuria: Secondary | ICD-10-CM

## 2019-01-09 MED ORDER — CEPHALEXIN 500 MG PO CAPS: 500.0000 mg | ORAL_CAPSULE | Freq: Two times a day (BID) | ORAL | 0 refills | Status: AC

## 2019-01-09 NOTE — Progress Notes (Signed)
Telephone visit  Subjective: CC: UTI PCP: Dettinger, Fransisca Kaufmann, MD KCL:EXNT Destiny [name] is a 53 y.o. female calls for telephone consult today. Patient provides verbal consent for consult held via phone.  Location of patient: home Location of provider: Working remotely from home Others present for call: none  1. Urinary symptoms Patient reports that she felt feverish and her temp was 99.50F yesterday.  She reports urinary urgency and frequency. Denies dysuria, hematuria.  She reports some mild low back pain yesterday.  She has been taking diflucan and Tylenol (which has been prescribed by her GI dr).  Symptoms are not improving.  She denies any nausea or vomiting.   ROS: Per HPI  Allergies  Allergen Reactions  . Latex Itching and Rash   Past Medical History:  Diagnosis Date  . Cancer ([place]) 10/29/15   rectal invasice adenocarcinoma   . Chronic back pain   . Depression   . Hypertension   . Hyperthyroidism   . Rectal cancer ([place])    Adenocarcinoma dx 10/29/15; Stage III, T3, N1, [place]    Current Outpatient Medications:  .  acetaminophen (TYLENOL) 500 MG tablet, Take 1,000 mg by mouth every 8 (eight) hours as needed for moderate pain., Disp: , Rfl:  .  colestipol (COLESTID) 1 g tablet, Take by mouth., Disp: , Rfl:  .  diphenoxylate-atropine (LOMOTIL) 2.5-0.025 MG tablet, TAKE (2) TABLETS FOUR TIMES DAILY., Disp: , Rfl:  .  fluticasone (FLONASE) 50 MCG/ACT nasal spray, Place 1 spray into both nostrils 2 (two) times daily., Disp: 16 g, Rfl: 2 .  gabapentin (NEURONTIN) 300 MG capsule, Take 1 capsule (300 mg total) by mouth 3 (three) times daily., Disp: 270 capsule, Rfl: 1 .  hydrochlorothiazide (HYDRODIURIL) 25 MG tablet, Take 1 tablet (25 mg total) by mouth daily., Disp: 90 tablet, Rfl: 3 .  levothyroxine (SYNTHROID, LEVOTHROID) 125 MCG tablet, TAKE 1 TABLET DAILY, Disp: 30 tablet, Rfl: 10 .  LORazepam (ATIVAN) 0.5 MG tablet, TAKE  (1)  TABLET TWICE A DAY. (Patient taking differently: TAKE 1  TABLET (0.5 MG) BY MOUTH AT BEDTIME AS NEEDED FOR ANXIETY), Disp: 60 tablet, Rfl: 1 .  ondansetron (ZOFRAN) 8 MG tablet, Take 1 tablet by mouth every 8 hours as needed for nausea or vomiting., Disp: 30 tablet, Rfl: 0 .  sertraline (ZOLOFT) 100 MG tablet, TAKE 1 TABLET BY MOUTH DAILY, Disp: 30 tablet, Rfl: 0 .  vitamin C (ASCORBIC ACID) 500 MG tablet, Take 500 mg by mouth daily., Disp: , Rfl:   Assessment/ Plan: 53 y.o. female   1. Acute cystitis without hematuria Clinically consistent with acute urinary tract infection.  I advised her to push clear fluids.  I have empirically treated her with Keflex 500 mg twice daily for 7 days.  She has home Diflucan if needed for vaginal yeast infection.  We discussed red flag signs and symptoms and reasons for urgent/emergent evaluation.  She voiced understanding will follow-up PRN. - cephALEXin (KEFLEX) 500 MG capsule; Take 1 capsule (500 mg total) by mouth 2 (two) times daily for 7 days.  Dispense: 14 capsule; Refill: 0   Start time: 1:12pm End time: 1:19pm  Total time spent on patient care (including telephone call/ virtual visit): 15 minutes  [place], [place] [phone]

## 2019-01-09 NOTE — Patient Instructions (Signed)
Urinary Tract Infection, Adult A urinary tract infection (UTI) is an infection of any part of the urinary tract. The urinary tract includes:  The kidneys.  The ureters.  The bladder.  The urethra. These organs make, store, and get rid of pee (urine) in the body. What are the causes? This is caused by germs (bacteria) in your genital area. These germs grow and cause swelling (inflammation) of your urinary tract. What increases the risk? You are more likely to develop this condition if:  You have a small, thin tube (catheter) to drain pee.  You cannot control when you pee or poop (incontinence).  You are female, and: ? You use these methods to prevent pregnancy: ? A medicine that kills sperm (spermicide). ? A device that blocks sperm (diaphragm). ? You have low levels of a female hormone (estrogen). ? You are pregnant.  You have genes that add to your risk.  You are sexually active.  You take antibiotic medicines.  You have trouble peeing because of: ? A prostate that is bigger than normal, if you are female. ? A blockage in the part of your body that drains pee from the bladder (urethra). ? A kidney stone. ? A nerve condition that affects your bladder (neurogenic bladder). ? Not getting enough to drink. ? Not peeing often enough.  You have other conditions, such as: ? Diabetes. ? A weak disease-fighting system (immune system). ? Sickle cell disease. ? Gout. ? Injury of the spine. What are the signs or symptoms? Symptoms of this condition include:  Needing to pee right away (urgently).  Peeing often.  Peeing small amounts often.  Pain or burning when peeing.  Blood in the pee.  Pee that smells bad or not like normal.  Trouble peeing.  Pee that is cloudy.  Fluid coming from the vagina, if you are female.  Pain in the belly or lower back. Other symptoms include:  Throwing up (vomiting).  No urge to eat.  Feeling mixed up (confused).  Being tired  and grouchy (irritable).  A fever.  Watery poop (diarrhea). How is this treated? This condition may be treated with:  Antibiotic medicine.  Other medicines.  Drinking enough water. Follow these instructions at home:  Medicines  Take over-the-counter and prescription medicines only as told by your doctor.  If you were prescribed an antibiotic medicine, take it as told by your doctor. Do not stop taking it even if you start to feel better. General instructions  Make sure you: ? Pee until your bladder is empty. ? Do not hold pee for a long time. ? Empty your bladder after sex. ? Wipe from front to back after pooping if you are a female. Use each tissue one time when you wipe.  Drink enough fluid to keep your pee pale yellow.  Keep all follow-up visits as told by your doctor. This is important. Contact a doctor if:  You do not get better after 1-2 days.  Your symptoms go away and then come back. Get help right away if:  You have very bad back pain.  You have very bad pain in your lower belly.  You have a fever.  You are sick to your stomach (nauseous).  You are throwing up. Summary  A urinary tract infection (UTI) is an infection of any part of the urinary tract.  This condition is caused by germs in your genital area.  There are many risk factors for a UTI. These include having a small, thin   tube to drain pee and not being able to control when you pee or poop.  Treatment includes antibiotic medicines for germs.  Drink enough fluid to keep your pee pale yellow. This information is not intended to replace advice given to you by your health care provider. Make sure you discuss any questions you have with your health care provider. Document Released: 11/04/2007 Document Revised: 05/05/2018 Document Reviewed: 11/25/2017 Elsevier Patient Education  2020 Elsevier Inc.  

## 2019-01-23 ENCOUNTER — Other Ambulatory Visit: Payer: Self-pay | Admitting: Family Medicine

## 2019-01-31 ENCOUNTER — Other Ambulatory Visit: Payer: Self-pay | Admitting: Family Medicine

## 2019-01-31 DIAGNOSIS — F331 Major depressive disorder, recurrent, moderate: Secondary | ICD-10-CM

## 2019-02-01 ENCOUNTER — Other Ambulatory Visit: Payer: Self-pay

## 2019-02-01 ENCOUNTER — Encounter: Payer: Self-pay | Admitting: Family Medicine

## 2019-02-01 ENCOUNTER — Ambulatory Visit (INDEPENDENT_AMBULATORY_CARE_PROVIDER_SITE_OTHER): Payer: Medicare Other | Admitting: Family Medicine

## 2019-02-01 VITALS — BP 105/65 | HR 71 | Temp 98.0°F | Ht 61.0 in | Wt 217.4 lb

## 2019-02-01 DIAGNOSIS — I10 Essential (primary) hypertension: Secondary | ICD-10-CM

## 2019-02-01 DIAGNOSIS — F331 Major depressive disorder, recurrent, moderate: Secondary | ICD-10-CM

## 2019-02-01 DIAGNOSIS — R350 Frequency of micturition: Secondary | ICD-10-CM

## 2019-02-01 DIAGNOSIS — Z01419 Encounter for gynecological examination (general) (routine) without abnormal findings: Secondary | ICD-10-CM

## 2019-02-01 DIAGNOSIS — E890 Postprocedural hypothyroidism: Secondary | ICD-10-CM

## 2019-02-01 LAB — URINALYSIS, COMPLETE
Bilirubin, UA: NEGATIVE
Glucose, UA: NEGATIVE
Ketones, UA: NEGATIVE
Nitrite, UA: NEGATIVE
Specific Gravity, UA: 1.02 (ref 1.005–1.030)
Urobilinogen, Ur: 0.2 mg/dL (ref 0.2–1.0)
pH, UA: 6 (ref 5.0–7.5)

## 2019-02-01 LAB — MICROSCOPIC EXAMINATION
RBC, Urine: >30 /hpf — AB (ref 0–2)
Renal Epithel, UA: NONE SEEN /hpf

## 2019-02-01 MED ORDER — GABAPENTIN 300 MG PO CAPS: 300.0000 mg | ORAL_CAPSULE | Freq: Three times a day (TID) | ORAL | 3 refills | Status: DC

## 2019-02-01 MED ORDER — LORAZEPAM 0.5 MG PO TABS: ORAL_TABLET | ORAL | 5 refills | Status: DC

## 2019-02-01 MED ORDER — LEVOTHYROXINE SODIUM 125 MCG PO TABS: 125.0000 ug | ORAL_TABLET | Freq: Every day | ORAL | 3 refills | Status: DC

## 2019-02-01 MED ORDER — HYDROCHLOROTHIAZIDE 25 MG PO TABS: 25.0000 mg | ORAL_TABLET | Freq: Every day | ORAL | 3 refills | Status: DC

## 2019-02-01 MED ORDER — SERTRALINE HCL 100 MG PO TABS: 100.0000 mg | ORAL_TABLET | Freq: Every day | ORAL | 3 refills | Status: DC

## 2019-02-01 NOTE — Progress Notes (Signed)
BP 105/65   Pulse 71   Temp 98 F (36.7 C) (Temporal)   Ht _0  (1.549 m)   Wt 217 lb 6.4 oz (98.6 kg)   LMP  (LMP Unknown) Comment: FULL   BMI 41.08 kg/m    Subjective:   Patient ID: Destiny [name], female    DOB: 1965-09-08, 53 y.o.   MRN: [id number]  HPI: Destiny [name] is a 53 y.o. female presenting on 02/01/2019 for Gynecologic Exam   HPI Exam and physical Patient is coming in today for well woman exam and physical exam.  She denies any breast or vaginal complaints.  She is denying any major health issues currently.  She is still seeing her oncology and feels like things are going well there.  Patient currently takes lorazepam and Zoloft and feels like they are both doing very well for her.  She also takes thyroid medication and blood pressure medication and a medication for neuropathy and feels like they are all doing well as well.  She denies any major health issues currently and is happy to continue on current medication.  Denies any breast issues or complaints or discharge or lumps or nodules.  Relevant past medical, surgical, family and social history reviewed and updated as indicated. Interim medical history since our last visit reviewed. Allergies and medications reviewed and updated.  Review of Systems  Constitutional: Negative for chills and fever.  HENT: Negative for congestion, ear discharge and ear pain.   Eyes: Negative for visual disturbance.  Respiratory: Negative for chest tightness and shortness of breath.   Cardiovascular: Negative for chest pain and leg swelling.  Genitourinary: Negative for difficulty urinating and dysuria.  Musculoskeletal: Negative for back pain and gait problem.  Skin: Negative for rash.  Neurological: Negative for light-headedness and headaches.  Psychiatric/Behavioral: Negative for agitation and behavioral problems.  All other systems reviewed and are negative.   Per HPI unless specifically indicated above   Allergies as of 02/01/2019       Reactions   Latex Itching, Rash      Medication List       Accurate as of February 01, 2019 10:38 AM. If you have any questions, ask your nurse or doctor.        acetaminophen 500 MG tablet Commonly known as: TYLENOL Take 1,000 mg by mouth every 8 (eight) hours as needed for moderate pain.   colestipol 1 g tablet Commonly known as: COLESTID Take by mouth.   diphenoxylate-atropine 2.5-0.025 MG tablet Commonly known as: LOMOTIL TAKE (2) TABLETS FOUR TIMES DAILY.   fluticasone 50 MCG/ACT nasal spray Commonly known as: FLONASE Place 1 spray into both nostrils 2 (two) times daily.   gabapentin 300 MG capsule Commonly known as: NEURONTIN Take 1 capsule (300 mg total) by mouth 3 (three) times daily.   hydrochlorothiazide 25 MG tablet Commonly known as: HYDRODIURIL Take 1 tablet (25 mg total) by mouth daily.   levothyroxine 125 MCG tablet Commonly known as: SYNTHROID TAKE 1 TABLET DAILY   LORazepam 0.5 MG tablet Commonly known as: ATIVAN TAKE  (1)  TABLET TWICE A DAY. What changed: See the new instructions.   ondansetron 8 MG tablet Commonly known as: ZOFRAN Take 1 tablet by mouth every 8 hours as needed for nausea or vomiting.   sertraline 100 MG tablet Commonly known as: ZOLOFT TAKE 1 TABLET BY MOUTH DAILY   vitamin C 500 MG tablet Commonly known as: ASCORBIC ACID Take 500 mg by mouth daily.  Objective:   BP 105/65   Pulse 71   Temp 98 F (36.7 C) (Temporal)   Ht _0  (1.549 m)   Wt 217 lb 6.4 oz (98.6 kg)   LMP  (LMP Unknown) Comment: FULL   BMI 41.08 kg/m   Wt Readings from Last 3 Encounters:  02/01/19 217 lb 6.4 oz (98.6 kg)  12/15/18 225 lb 3 oz (102.1 kg)  06/13/18 225 lb (102.1 kg)    Physical Exam Vitals signs and nursing note reviewed.  Constitutional:      General: She is not in acute distress.    Appearance: She is well-developed. She is not diaphoretic.  Eyes:     Conjunctiva/sclera: Conjunctivae normal.     Pupils:  Pupils are equal, round, and reactive to light.  Neck:     Musculoskeletal: Neck supple.     Thyroid: No thyromegaly.  Cardiovascular:     Rate and Rhythm: Normal rate and regular rhythm.     Heart sounds: Normal heart sounds. No murmur.  Pulmonary:     Effort: Pulmonary effort is normal. No respiratory distress.     Breath sounds: Normal breath sounds. No wheezing.  Chest:     Breasts: Breasts are symmetrical.        Right: No inverted nipple, mass, nipple discharge, skin change or tenderness.        Left: No inverted nipple, mass, nipple discharge, skin change or tenderness.  Abdominal:     General: Bowel sounds are normal. There is no distension.     Palpations: Abdomen is soft.     Tenderness: There is no abdominal tenderness. There is no guarding or rebound.  Genitourinary:    Exam position: Supine.     Labia:        Right: No rash or lesion.        Left: No rash or lesion.      Vagina: Erythema (Has a mild amount of erythema and irritation in the vaginal wall but patient denies any major issues.) present.     Comments: Cervix and uterus is absent Musculoskeletal: Normal range of motion.        General: No tenderness.  Lymphadenopathy:     Cervical: No cervical adenopathy.  Skin:    General: Skin is warm and dry.     Findings: No rash.  Neurological:     Mental Status: She is alert and oriented to person, place, and time.     Coordination: Coordination normal.  Psychiatric:        Behavior: Behavior normal.       Assessment & Plan:   Problem List Items Addressed This Visit      Cardiovascular and Mediastinum   Essential hypertension, benign   Relevant Medications   hydrochlorothiazide (HYDRODIURIL) 25 MG tablet   Other Relevant Orders   CMP14+EGFR     Endocrine   Hypothyroidism   Relevant Medications   levothyroxine (SYNTHROID) 125 MCG tablet   Other Relevant Orders   TSH     Other   Depression   Relevant Medications   sertraline (ZOLOFT) 100 MG  tablet   gabapentin (NEURONTIN) 300 MG capsule   LORazepam (ATIVAN) 0.5 MG tablet   Other Relevant Orders   ToxASSURE Select 13 (MW), Urine    Other Visit Diagnoses    Well woman exam with routine gynecological exam    -  Primary   Relevant Orders   CBC with Differential/Platelet   CMP14+EGFR   TSH  PAP IG, CT-NG, RFX HPV ALL   Frequency of urination       Relevant Orders   Urinalysis, Complete      Did well woman exam and Pap smear, patient is self-pay, will do urinalysis because of frequency and urine tox assure because of the controlled substance, aside from that she will see what other labs she can pay for  Follow up plan: Return in about 6 months (around 08/01/2019), or if symptoms worsen or fail to improve, for Depression and anxiety recheck.  Counseling provided for all of the vaccine components No orders of the defined types were placed in this encounter.   Caryl Pina, MD [facility] 02/01/2019, 10:38 AM

## 2019-02-02 LAB — CBC WITH DIFFERENTIAL/PLATELET
Basophils Absolute: 0 10*3/uL (ref 0.0–0.2)
Basos: 1 %
EOS (ABSOLUTE): 0.3 10*3/uL (ref 0.0–0.4)
Eos: 5 %
Hematocrit: 34.8 % (ref 34.0–46.6)
Hemoglobin: 11.6 g/dL (ref 11.1–15.9)
Immature Grans (Abs): 0 10*3/uL (ref 0.0–0.1)
Immature Granulocytes: 0 %
Lymphocytes Absolute: 1.1 10*3/uL (ref 0.7–3.1)
Lymphs: 20 %
MCH: 27.6 pg (ref 26.6–33.0)
MCHC: 33.3 g/dL (ref 31.5–35.7)
MCV: 83 fL (ref 79–97)
Monocytes Absolute: 0.4 10*3/uL (ref 0.1–0.9)
Monocytes: 8 %
Neutrophils Absolute: 3.6 10*3/uL (ref 1.4–7.0)
Neutrophils: 66 %
Platelets: 344 10*3/uL (ref 150–450)
RBC: 4.2 x10E6/uL (ref 3.77–5.28)
RDW: 14.3 % (ref 11.7–15.4)
WBC: 5.4 10*3/uL (ref 3.4–10.8)

## 2019-02-02 LAB — CMP14+EGFR
ALT: 13 IU/L (ref 0–32)
AST: 17 IU/L (ref 0–40)
Albumin/Globulin Ratio: 1.5 (ref 1.2–2.2)
Albumin: 4.3 g/dL (ref 3.8–4.9)
Alkaline Phosphatase: 66 IU/L (ref 39–117)
BUN/Creatinine Ratio: 18 (ref 9–23)
BUN: 15 mg/dL (ref 6–24)
Bilirubin Total: 0.2 mg/dL (ref 0.0–1.2)
CO2: 25 mmol/L (ref 20–29)
Calcium: 9.4 mg/dL (ref 8.7–10.2)
Chloride: 101 mmol/L (ref 96–106)
Creatinine, Ser: 0.83 mg/dL (ref 0.57–1.00)
GFR calc Af Amer: 93 mL/min/{1.73_m2} (ref >59)
GFR calc non Af Amer: 81 mL/min/{1.73_m2} (ref >59)
Globulin, Total: 2.9 g/dL (ref 1.5–4.5)
Glucose: 122 mg/dL — ABNORMAL HIGH (ref 65–99)
Potassium: 4.2 mmol/L (ref 3.5–5.2)
Sodium: 140 mmol/L (ref 134–144)
Total Protein: 7.2 g/dL (ref 6.0–8.5)

## 2019-02-02 LAB — TSH: TSH: 2.81 u[IU]/mL (ref 0.450–4.500)

## 2019-02-03 LAB — TOXASSURE SELECT 13 (MW), URINE

## 2019-02-05 LAB — PAP IG, CT-NG, RFX HPV ALL
Chlamydia, Nuc. Acid Amp: NEGATIVE
Gonococcus by Nucleic Acid Amp: NEGATIVE

## 2019-02-07 ENCOUNTER — Telehealth: Payer: Self-pay | Admitting: *Deleted

## 2019-02-07 NOTE — Telephone Encounter (Signed)
Pt called and aware of normal urine.

## 2019-02-07 NOTE — Telephone Encounter (Signed)
Pt is having continued urinary frequency and burning Pt had OV w/ Dr Dettinger on 02/01/2019  Please review UA and advise

## 2019-02-07 NOTE — Telephone Encounter (Signed)
ua ok

## 2019-02-21 ENCOUNTER — Encounter: Payer: Self-pay | Admitting: *Deleted

## 2019-06-19 ENCOUNTER — Inpatient Hospital Stay: Payer: PPO

## 2019-06-19 ENCOUNTER — Inpatient Hospital Stay: Payer: PPO | Attending: Oncology | Admitting: Oncology

## 2019-06-19 ENCOUNTER — Other Ambulatory Visit: Payer: Self-pay

## 2019-06-19 VITALS — BP 129/66 | HR 68 | Temp 98.2°F | Resp 17 | Ht 61.0 in | Wt 213.8 lb

## 2019-06-19 DIAGNOSIS — C20 Malignant neoplasm of rectum: Secondary | ICD-10-CM | POA: Insufficient documentation

## 2019-06-19 DIAGNOSIS — I10 Essential (primary) hypertension: Secondary | ICD-10-CM | POA: Diagnosis not present

## 2019-06-19 DIAGNOSIS — E0590 Thyrotoxicosis, unspecified without thyrotoxic crisis or storm: Secondary | ICD-10-CM | POA: Insufficient documentation

## 2019-06-19 LAB — CEA (IN HOUSE-CHCC): CEA (CHCC-In House): 1.62 ng/mL (ref 0.00–5.00)

## 2019-06-19 NOTE — Progress Notes (Signed)
[facility] OFFICE PROGRESS NOTE   Diagnosis: Rectal cancer  INTERVAL HISTORY:   Destiny [name] returns for a scheduled visit.  Good appetite.  She reports intentional weight loss.  She continues to have frequent bowel movements, but this has improved compared to immediately following ileostomy reversal.  She has occasional blood on the toilet tissue with straining or frequent bowel movements.  She has discomfort at the right ileostomy site.  Objective:  Vital signs in last 24 hours:  Blood pressure 129/66, pulse 68, temperature 98.2 F (36.8 C), temperature source Temporal, resp. rate 17, height 5' 1" (1.549 m), weight 213 lb 12.8 oz (97 kg), SpO2 100 %.    HEENT: Neck without mass Lymphatics: No cervical, supraclavicular, axillary, or inguinal nodes GI: No hepatosplenomegaly, no mass, mild tenderness at the right ileostomy scar Vascular: No leg edema   Lab Results:  Lab Results  Component Value Date   WBC 5.4 02/01/2019   HGB 11.6 02/01/2019   HCT 34.8 02/01/2019   MCV 83 02/01/2019   PLT 344 02/01/2019   NEUTROABS 3.6 02/01/2019    CMP  Lab Results  Component Value Date   NA 140 02/01/2019   K 4.2 02/01/2019   CL 101 02/01/2019   CO2 25 02/01/2019   GLUCOSE 122 (H) 02/01/2019   BUN 15 02/01/2019   CREATININE 0.83 02/01/2019   CALCIUM 9.4 02/01/2019   PROT 7.2 02/01/2019   ALBUMIN 4.3 02/01/2019   AST 17 02/01/2019   ALT 13 02/01/2019   ALKPHOS 66 02/01/2019   BILITOT 0.2 02/01/2019   GFRNONAA 81 02/01/2019   GFRAA 93 02/01/2019    Lab Results  Component Value Date   CEA1 1.25 12/13/2018    Medications: I have reviewed the patient's current medications.   Assessment/Plan:  1. Rectal cancer-clinical stage III (T3 N1)  No loss of mismatch repair protein expression  T3 N1 tumor beginning at 2 cm from the anal verge on an MRI of the pelvis 11/08/2015  CTs of the chest, abdomen, and pelvis 11/01/2015 with no evidence of distant  metastatic disease  Initiation of neoadjuvant radiation/Xeloda 11/25/2015; completion of radiation/Xeloda 01/02/2016  Low anterior resection with a coloanal anastomosis 02/26/2016,ypT2,ypN0. Grade 3 adenocarcinoma, negative resection margins09/27/2017  No BRAF, KRAS, NRAS mutation; Microsatellite status could not be determined  CT abdomen/pelvis 03/30/2016-no evidence of recurrent rectal cancer  Cycle 1 adjuvant Xeloda 04/14/2016  Cycle 2 adjuvant Xeloda 05/08/2016  Cycle 3 adjuvant Xeloda 05/29/2016  Cycle 4 adjuvant Xeloda 06/20/2016 (completed approximately 10 days)  Cycle 5 adjuvant Xeloda started approximately 07/19/2016  CT 12/04/2016-no evidence of metastatic disease  CT 12/08/2017- no evidence of metastatic disease  CT 12/13/2018-no evidence of metastatic disease  2. History of hypertension  3. Left ureteral injury at the low anterior resection-status post a left ureteral neocystostomy and left double-J stent placement 02/26/2016  4. Hyperthyroidism status post radioactive iodine 05/12/2016  5.Ileostomy reversal 09/02/2016  6.Hospitalization 10/20/2016 through 10/22/2016 with UTI/sepsis.   Disposition: Destiny [name] has a history of stage III rectal cancer.  She is in clinical remission.  We will follow up on the CEA from today.  She will be due for a surveillance colonoscopy later this year.  She will follow up with Dr. Havery Moros for persistent rectal bleeding.  I suspect the discomfort at the ileostomy site is related to surgery.  Destiny [name] will return for an office visit and CEA in 6 months.  Betsy Coder, MD  06/19/2019  12:27 PM

## 2019-06-20 ENCOUNTER — Telehealth: Payer: Self-pay

## 2019-06-20 NOTE — Telephone Encounter (Signed)
Pt called CEA results relayed. Pt verbalized understanding, No further problems or concerns noted.

## 2019-06-20 NOTE — Telephone Encounter (Signed)
-----   Message from Ladell Pier, MD sent at 06/19/2019  5:02 PM EST ----- Please call patient, the CEA is normal, follow-up as scheduled

## 2019-06-22 ENCOUNTER — Telehealth: Payer: Self-pay | Admitting: Oncology

## 2019-06-22 NOTE — Telephone Encounter (Signed)
Scheduled per los. Called and left msg. Mailed printout  °

## 2019-07-04 DIAGNOSIS — Z23 Encounter for immunization: Secondary | ICD-10-CM | POA: Diagnosis not present

## 2019-07-31 DIAGNOSIS — D369 Benign neoplasm, unspecified site: Secondary | ICD-10-CM | POA: Insufficient documentation

## 2019-08-17 DIAGNOSIS — Z980 Intestinal bypass and anastomosis status: Secondary | ICD-10-CM | POA: Diagnosis not present

## 2019-08-17 DIAGNOSIS — K635 Polyp of colon: Secondary | ICD-10-CM | POA: Diagnosis not present

## 2019-08-17 DIAGNOSIS — D125 Benign neoplasm of sigmoid colon: Secondary | ICD-10-CM | POA: Diagnosis not present

## 2019-08-17 DIAGNOSIS — Z85048 Personal history of other malignant neoplasm of rectum, rectosigmoid junction, and anus: Secondary | ICD-10-CM | POA: Diagnosis not present

## 2019-09-05 ENCOUNTER — Other Ambulatory Visit: Payer: Self-pay

## 2019-09-05 ENCOUNTER — Ambulatory Visit (INDEPENDENT_AMBULATORY_CARE_PROVIDER_SITE_OTHER): Payer: PPO | Admitting: Family Medicine

## 2019-09-05 ENCOUNTER — Encounter: Payer: Self-pay | Admitting: Family Medicine

## 2019-09-05 VITALS — BP 135/81 | HR 77 | Temp 97.5°F | Ht 61.0 in | Wt 220.0 lb

## 2019-09-05 DIAGNOSIS — M461 Sacroiliitis, not elsewhere classified: Secondary | ICD-10-CM

## 2019-09-05 MED ORDER — CYCLOBENZAPRINE HCL 10 MG PO TABS: 10.0000 mg | ORAL_TABLET | Freq: Every day | ORAL | 2 refills | Status: DC

## 2019-09-05 MED ORDER — BETAMETHASONE SOD PHOS & ACET 6 (3-3) MG/ML IJ SUSP
6.0000 mg | Freq: Once | INTRAMUSCULAR | Status: AC
  Administered 2019-09-05: 17:00:00 6 mg via INTRAMUSCULAR

## 2019-09-05 MED ORDER — DICLOFENAC SODIUM 75 MG PO TBEC: 75.0000 mg | DELAYED_RELEASE_TABLET | Freq: Two times a day (BID) | ORAL | 2 refills | Status: DC

## 2019-09-05 NOTE — Patient Instructions (Signed)

## 2019-09-05 NOTE — Progress Notes (Signed)
Chief Complaint  Patient presents with  . Back Pain    Lower, 1-2 week,     HPI  Patient presents today for 1 week of increasing midline lower back pain.  She points to the SI area bilaterally.  The pain is now 8/10.  She has been working with a patient as a Soil scientist.  This patient has a broken hip and requires a lot of physical lifting for Ms. Gebre.  There is no radiation of the pain to either lower extremity.  No nausea vomiting diarrhea or fever.  No flank pain.  No dysuria.  PMH: Smoking status noted ROS: Per HPI  Objective: BP 135/81   Pulse 77   Temp (!) 97.5 F (36.4 C) (Temporal)   Ht 5\' 1"  (1.549 m)   Wt 220 lb (99.8 kg)   LMP  (LMP Unknown) Comment: FULL   BMI 41.57 kg/m  Gen: NAD, alert, cooperative with exam HEENT: NCAT, EOMI, PERRL CV: RRR, good S1/S2, no murmur Resp: CTABL, no wheezes, non-labored Abd: SNTND, BS present, no guarding or organomegaly Ext: No edema, warm.  There is tenderness about the SI joint bilaterally.  There is some palpable muscle spasm of the lower lumbar spine [employer].  Low back pain Neuro: Alert and oriented, No gross deficits  Assessment and plan:  1. Sacroiliitis ([place])     Meds ordered this encounter  Medications  . diclofenac (VOLTAREN) 75 MG EC tablet    Sig: Take 1 tablet (75 mg total) by mouth 2 (two) times daily. For muscle and  Joint pain    Dispense:  60 tablet    Refill:  2  . cyclobenzaprine (FLEXERIL) 10 MG tablet    Sig: Take 1 tablet (10 mg total) by mouth at bedtime. To relax muscles    Dispense:  30 tablet    Refill:  2  . betamethasone acetate-betamethasone sodium phosphate (CELESTONE) injection 6 mg    No orders of the defined types were placed in this encounter.   Follow up as needed.  Claretta Fraise, MD

## 2019-10-10 ENCOUNTER — Telehealth: Payer: Self-pay | Admitting: Family Medicine

## 2019-10-10 NOTE — Telephone Encounter (Signed)
Pt only wanted to see Dr Warrick Parisian, appt made for 10/19/19

## 2019-10-19 ENCOUNTER — Ambulatory Visit: Payer: Medicare HMO | Admitting: Family Medicine

## 2019-10-23 ENCOUNTER — Ambulatory Visit: Payer: Medicare HMO | Admitting: Family Medicine

## 2019-10-24 ENCOUNTER — Encounter: Payer: Self-pay | Admitting: Family Medicine

## 2019-10-25 ENCOUNTER — Encounter: Payer: Self-pay | Admitting: Family Medicine

## 2019-10-25 ENCOUNTER — Ambulatory Visit (INDEPENDENT_AMBULATORY_CARE_PROVIDER_SITE_OTHER): Payer: Medicare HMO | Admitting: Family Medicine

## 2019-10-25 ENCOUNTER — Other Ambulatory Visit: Payer: Self-pay

## 2019-10-25 VITALS — BP 106/65 | HR 107 | Temp 102.0°F | Ht 61.0 in | Wt 216.1 lb

## 2019-10-25 DIAGNOSIS — R109 Unspecified abdominal pain: Secondary | ICD-10-CM | POA: Diagnosis not present

## 2019-10-25 DIAGNOSIS — N12 Tubulo-interstitial nephritis, not specified as acute or chronic: Secondary | ICD-10-CM | POA: Diagnosis not present

## 2019-10-25 LAB — URINALYSIS, COMPLETE
Bilirubin, UA: NEGATIVE
Glucose, UA: NEGATIVE
Nitrite, UA: NEGATIVE
Specific Gravity, UA: 1.02 (ref 1.005–1.030)
Urobilinogen, Ur: 2 mg/dL — ABNORMAL HIGH (ref 0.2–1.0)
pH, UA: 5.5 (ref 5.0–7.5)

## 2019-10-25 LAB — MICROSCOPIC EXAMINATION: WBC, UA: >30 /hpf — AB (ref 0–5)

## 2019-10-25 MED ORDER — CIPROFLOXACIN HCL 500 MG PO TABS: 500.0000 mg | ORAL_TABLET | Freq: Two times a day (BID) | ORAL | 0 refills | Status: DC

## 2019-10-25 MED ORDER — CEFTRIAXONE SODIUM 1 G IJ SOLR
1.0000 g | Freq: Once | INTRAMUSCULAR | Status: AC
  Administered 2019-10-25: 1 g via INTRAMUSCULAR

## 2019-10-25 NOTE — Progress Notes (Signed)
BP 106/65   Pulse (!) 107   Temp (!) 102 F (38.9 C) (Temporal)   Ht 5\' 1"  (1.549 m)   Wt 216 lb 2 oz (98 kg)   LMP  (LMP Unknown) Comment: FULL   BMI 40.84 kg/m    Subjective:   Patient ID: Destiny [name], female    DOB: 17-Mar-1966, 54 y.o.   MRN: [id number]  HPI: Destiny [name] is a 54 y.o. female presenting on 10/25/2019 for Back Pain (pt here today c/o back pain x 2 weeks)   HPI Patient comes in complaining of back pain and flank pain has been going on for 2 weeks but became very severe overnight.  She also has urinary frequency and pain coming around on that left side towards her abdomen as well.  She says it comes down from her left flank all the way around to the left side towards her upper down into her lower abdomen.  She says this is all worsened over the past 24 hours.  She says she initially started with just the back pain and thought she was coming in for steroid injection to treat her back pain.  She denied having any fevers or chills but on her temperature today she has a fever of 102.  Relevant past medical, surgical, family and social history reviewed and updated as indicated. Interim medical history since our last visit reviewed. Allergies and medications reviewed and updated.  Review of Systems  Constitutional: Positive for fever. Negative for chills.  Eyes: Negative for visual disturbance.  Respiratory: Negative for chest tightness and shortness of breath.   Cardiovascular: Negative for chest pain and leg swelling.  Gastrointestinal: Positive for abdominal pain.  Genitourinary: Positive for dysuria, flank pain, frequency and urgency. Negative for difficulty urinating, hematuria, vaginal bleeding, vaginal discharge and vaginal pain.  Musculoskeletal: Positive for back pain. Negative for gait problem.  Skin: Negative for rash.  Neurological: Negative for light-headedness and headaches.  Psychiatric/Behavioral: Negative for agitation and behavioral problems.  All other  systems reviewed and are negative.   Per HPI unless specifically indicated above   Allergies as of 10/25/2019      Reactions   Latex Itching, Rash      Medication List       Accurate as of Oct 25, 2019  5:15 PM. If you have any questions, ask your nurse or doctor.        acetaminophen 500 MG tablet Commonly known as: TYLENOL Take 1,000 mg by mouth every 8 (eight) hours as needed for moderate pain.   ciprofloxacin 500 MG tablet Commonly known as: Cipro Take 1 tablet (500 mg total) by mouth 2 (two) times daily. Started by: Worthy Rancher, MD   cyclobenzaprine 10 MG tablet Commonly known as: FLEXERIL Take 1 tablet (10 mg total) by mouth at bedtime. To relax muscles   diclofenac 75 MG EC tablet Commonly known as: VOLTAREN Take 1 tablet (75 mg total) by mouth 2 (two) times daily. For muscle and  Joint pain   fluticasone 50 MCG/ACT nasal spray Commonly known as: FLONASE Place 1 spray into both nostrils 2 (two) times daily.   gabapentin 300 MG capsule Commonly known as: NEURONTIN Take 1 capsule (300 mg total) by mouth 3 (three) times daily.   hydrochlorothiazide 25 MG tablet Commonly known as: HYDRODIURIL Take 1 tablet (25 mg total) by mouth daily.   levothyroxine 125 MCG tablet Commonly known as: SYNTHROID Take 1 tablet (125 mcg total) by mouth daily.  LORazepam 0.5 MG tablet Commonly known as: ATIVAN TAKE 1 TABLET (0.5 MG) BY MOUTH AT BEDTIME AS NEEDED FOR ANXIETY   ondansetron 8 MG tablet Commonly known as: ZOFRAN Take 1 tablet by mouth every 8 hours as needed for nausea or vomiting.   sertraline 100 MG tablet Commonly known as: ZOLOFT Take 1 tablet (100 mg total) by mouth daily.   TURMERIC CURCUMIN PO Take by mouth.   vitamin B-12 100 MCG tablet Commonly known as: CYANOCOBALAMIN Take 100 mcg by mouth daily.   vitamin C 500 MG tablet Commonly known as: ASCORBIC ACID Take 500 mg by mouth daily.   VITAMIN D3 PO Take by mouth.         Objective:   BP 106/65   Pulse (!) 107   Temp (!) 102 F (38.9 C) (Temporal)   Ht 5\' 1"  (1.549 m)   Wt 216 lb 2 oz (98 kg)   LMP  (LMP Unknown) Comment: FULL   BMI 40.84 kg/m   Wt Readings from Last 3 Encounters:  10/25/19 216 lb 2 oz (98 kg)  09/05/19 220 lb (99.8 kg)  06/19/19 213 lb 12.8 oz (97 kg)    Physical Exam Vitals and nursing note reviewed.  Constitutional:      General: She is not in acute distress.    Appearance: She is well-developed. She is not diaphoretic.  Eyes:     Conjunctiva/sclera: Conjunctivae normal.  Abdominal:     Tenderness: There is abdominal tenderness in the left upper quadrant and left lower quadrant. There is left CVA tenderness. There is no guarding.  Musculoskeletal:        General: No tenderness. Normal range of motion.  Skin:    General: Skin is warm and dry.     Findings: No rash.  Neurological:     Mental Status: She is alert and oriented to person, place, and time.     Coordination: Coordination normal.  Psychiatric:        Behavior: Behavior normal.     Urinalysis: Greater than 30 WBCs, 3-10 RBCs, many bacteria, 2+ blood, trace ketones, 2+ leukocytes and 3+ protein.  Assessment & Plan:   Problem List Items Addressed This Visit    None    Visit Diagnoses    Pyelonephritis    -  Primary   Relevant Medications   cefTRIAXone (ROCEPHIN) injection 1 g (Completed)   ciprofloxacin (CIPRO) 500 MG tablet   Flank pain       Relevant Medications   cefTRIAXone (ROCEPHIN) injection 1 g (Completed)   ciprofloxacin (CIPRO) 500 MG tablet   Other Relevant Orders   Urinalysis, Complete   Urine Culture    Highly concerning for pyelonephritis, will treat with Rocephin and Cipro but if she worsens she needs to go to the emergency department.  Possible differential of diverticulitis but with positive urinalysis, I think it is pyelonephritis  Follow up plan: Return in about 2 days (around 10/27/2019), or if symptoms worsen or fail to  improve, for Follow-up abdominal pain/flank pain.  Counseling provided for all of the vaccine components Orders Placed This Encounter  Procedures  . Urine Culture  . Urinalysis, Complete    Caryl Pina, MD [facility] 10/25/2019, 5:15 PM

## 2019-10-27 ENCOUNTER — Other Ambulatory Visit: Payer: Self-pay

## 2019-10-27 ENCOUNTER — Encounter: Payer: Self-pay | Admitting: Family

## 2019-10-27 ENCOUNTER — Ambulatory Visit (INDEPENDENT_AMBULATORY_CARE_PROVIDER_SITE_OTHER): Payer: Medicare HMO | Admitting: Family

## 2019-10-27 NOTE — Progress Notes (Signed)
Pt left before I could see her in room. Told nurse she felt "good" and left.

## 2019-10-28 LAB — URINE CULTURE

## 2019-11-20 ENCOUNTER — Other Ambulatory Visit: Payer: Self-pay | Admitting: Family Medicine

## 2019-11-20 DIAGNOSIS — F331 Major depressive disorder, recurrent, moderate: Secondary | ICD-10-CM

## 2019-12-02 ENCOUNTER — Other Ambulatory Visit: Payer: Self-pay | Admitting: Family Medicine

## 2019-12-12 ENCOUNTER — Encounter: Payer: Self-pay | Admitting: Gastroenterology

## 2019-12-18 ENCOUNTER — Inpatient Hospital Stay: Payer: Medicare Other | Admitting: Nurse Practitioner

## 2019-12-18 ENCOUNTER — Inpatient Hospital Stay: Payer: Medicare Other

## 2020-01-05 ENCOUNTER — Other Ambulatory Visit: Payer: Self-pay | Admitting: Family Medicine

## 2020-02-16 ENCOUNTER — Other Ambulatory Visit: Payer: Self-pay | Admitting: Family Medicine

## 2020-02-16 DIAGNOSIS — E890 Postprocedural hypothyroidism: Secondary | ICD-10-CM

## 2020-02-20 ENCOUNTER — Other Ambulatory Visit: Payer: Self-pay | Admitting: Family Medicine

## 2020-02-20 DIAGNOSIS — F331 Major depressive disorder, recurrent, moderate: Secondary | ICD-10-CM

## 2020-02-20 DIAGNOSIS — I10 Essential (primary) hypertension: Secondary | ICD-10-CM

## 2020-02-21 ENCOUNTER — Other Ambulatory Visit: Payer: Self-pay | Admitting: Family Medicine

## 2020-02-21 DIAGNOSIS — I10 Essential (primary) hypertension: Secondary | ICD-10-CM

## 2020-02-21 DIAGNOSIS — E890 Postprocedural hypothyroidism: Secondary | ICD-10-CM

## 2020-02-21 DIAGNOSIS — F331 Major depressive disorder, recurrent, moderate: Secondary | ICD-10-CM

## 2020-02-22 ENCOUNTER — Other Ambulatory Visit: Payer: Self-pay | Admitting: Family Medicine

## 2020-02-22 DIAGNOSIS — E890 Postprocedural hypothyroidism: Secondary | ICD-10-CM

## 2020-02-22 DIAGNOSIS — F331 Major depressive disorder, recurrent, moderate: Secondary | ICD-10-CM

## 2020-02-22 DIAGNOSIS — I10 Essential (primary) hypertension: Secondary | ICD-10-CM

## 2020-03-08 ENCOUNTER — Other Ambulatory Visit: Payer: Self-pay

## 2020-03-08 MED ORDER — DICLOFENAC SODIUM 75 MG PO TBEC
75.0000 mg | DELAYED_RELEASE_TABLET | Freq: Two times a day (BID) | ORAL | 1 refills | Status: DC
Start: 2020-03-08 — End: 2020-04-19

## 2020-03-08 NOTE — Telephone Encounter (Signed)
  Prescription Request  03/08/2020  What is the name of the medication or equipment? diclofenac (VOLTAREN) 75 MG EC tablet    Have you contacted your pharmacy to request a refill? (if applicable) yes  Which pharmacy would you like this sent to? [facility]   Patient notified that their request is being sent to the clinical staff for review and that they should receive a response within 2 business days.

## 2020-03-13 ENCOUNTER — Other Ambulatory Visit: Payer: Self-pay | Admitting: Family Medicine

## 2020-03-13 DIAGNOSIS — R109 Unspecified abdominal pain: Secondary | ICD-10-CM

## 2020-03-13 DIAGNOSIS — N12 Tubulo-interstitial nephritis, not specified as acute or chronic: Secondary | ICD-10-CM

## 2020-03-14 ENCOUNTER — Other Ambulatory Visit: Payer: Self-pay | Admitting: Family Medicine

## 2020-03-14 DIAGNOSIS — I10 Essential (primary) hypertension: Secondary | ICD-10-CM

## 2020-03-14 DIAGNOSIS — F331 Major depressive disorder, recurrent, moderate: Secondary | ICD-10-CM

## 2020-03-15 NOTE — Telephone Encounter (Signed)
Appointment scheduled.

## 2020-03-15 NOTE — Telephone Encounter (Signed)
Attempted to contact patient - NA °

## 2020-03-15 NOTE — Telephone Encounter (Signed)
[place]er NTBS 30 days given 02/20/20

## 2020-03-18 ENCOUNTER — Other Ambulatory Visit: Payer: Self-pay | Admitting: Family Medicine

## 2020-03-18 DIAGNOSIS — F331 Major depressive disorder, recurrent, moderate: Secondary | ICD-10-CM

## 2020-03-18 DIAGNOSIS — I10 Essential (primary) hypertension: Secondary | ICD-10-CM

## 2020-03-18 NOTE — Addendum Note (Signed)
Addended by: Antonietta Barcelona D on: 03/18/2020 08:25 AM   Modules accepted: Orders

## 2020-03-22 ENCOUNTER — Encounter: Payer: Self-pay | Admitting: Family Medicine

## 2020-03-22 ENCOUNTER — Other Ambulatory Visit: Payer: Self-pay

## 2020-03-22 ENCOUNTER — Ambulatory Visit (INDEPENDENT_AMBULATORY_CARE_PROVIDER_SITE_OTHER): Payer: Medicare HMO | Admitting: Family Medicine

## 2020-03-22 VITALS — BP 127/80 | HR 77 | Temp 97.3°F | Ht 61.0 in | Wt 223.0 lb

## 2020-03-22 DIAGNOSIS — Z1231 Encounter for screening mammogram for malignant neoplasm of breast: Secondary | ICD-10-CM

## 2020-03-22 DIAGNOSIS — Z1159 Encounter for screening for other viral diseases: Secondary | ICD-10-CM

## 2020-03-22 DIAGNOSIS — F331 Major depressive disorder, recurrent, moderate: Secondary | ICD-10-CM

## 2020-03-22 DIAGNOSIS — Z23 Encounter for immunization: Secondary | ICD-10-CM | POA: Diagnosis not present

## 2020-03-22 DIAGNOSIS — E890 Postprocedural hypothyroidism: Secondary | ICD-10-CM

## 2020-03-22 DIAGNOSIS — Z114 Encounter for screening for human immunodeficiency virus [HIV]: Secondary | ICD-10-CM

## 2020-03-22 DIAGNOSIS — I10 Essential (primary) hypertension: Secondary | ICD-10-CM

## 2020-03-22 DIAGNOSIS — D649 Anemia, unspecified: Secondary | ICD-10-CM | POA: Diagnosis not present

## 2020-03-22 MED ORDER — LORAZEPAM 0.5 MG PO TABS: ORAL_TABLET | ORAL | 5 refills | Status: DC

## 2020-03-22 NOTE — Progress Notes (Signed)
BP 127/80   Pulse 77   Temp (!) 97.3 F (36.3 C)   Ht '5\' 1"'  (1.549 m)   Wt 223 lb (101.2 kg)   LMP  (LMP Unknown) Comment: FULL   SpO2 98%   BMI 42.14 kg/m    Subjective:   Patient ID: Destiny [name], female    DOB: 18-Oct-1965, 54 y.o.   MRN: [id number]  HPI: Destiny [name] is a 54 y.o. female presenting on 03/22/2020 for Medical Management of Chronic Issues   HPI Anxiety recheck Current rx-Ativan 0.5 mg nightly as needed, she only uses it very infrequently # meds rx-30 Effectiveness of current meds-works well, only uses infrequently Adverse reactions form meds-none  Pill count performed-No Last drug screen -9 4 2020 ( high risk q6m moderate risk q665mlow risk yearly ) Urine drug screen today- Yes [place]  If yes were their any concerning findings? -None  No flowsheet data found.   Controlled substance contract signed on: Today  Hypertension Patient is currently on hydrochlorothiazide, and their blood pressure today is 127/80. Patient denies any lightheadedness or dizziness. Patient denies headaches, blurred vision, chest pains, shortness of breath, or weakness. Denies any side effects from medication and is content with current medication.   Hypothyroidism recheck Patient is coming in for thyroid recheck today as well. They deny any issues with hair changes or heat or cold problems or diarrhea or constipation. They deny any chest pain or palpitations. They are currently on levothyroxine 125 micrograms   Relevant past medical, surgical, family and social history reviewed and updated as indicated. Interim medical history since our last visit reviewed. Allergies and medications reviewed and updated.  Review of Systems  Constitutional: Negative for chills and fever.  Eyes: Negative for visual disturbance.  Respiratory: Negative for chest tightness and shortness of breath.   Cardiovascular: Negative for chest pain and leg swelling.  Musculoskeletal:  Negative for back pain and gait problem.  Skin: Negative for rash.  Neurological: Negative for dizziness, light-headedness and headaches.  Psychiatric/Behavioral: Positive for sleep disturbance. Negative for agitation, behavioral problems, dysphoric mood, self-injury and suicidal ideas. The patient is nervous/anxious.   All other systems reviewed and are negative.   Per HPI unless specifically indicated above   Allergies as of 03/22/2020      Reactions   Latex Itching, Rash      Medication List       Accurate as of March 22, 2020  9:54 AM. If you have any questions, ask your nurse or doctor.        acetaminophen 500 MG tablet Commonly known as: TYLENOL Take 1,000 mg by mouth every 8 (eight) hours as needed for moderate pain.   ciprofloxacin 500 MG tablet Commonly known as: Cipro Take 1 tablet (500 mg total) by mouth 2 (two) times daily.   cyclobenzaprine 10 MG tablet Commonly known as: FLEXERIL TAKE 1 TABLET (10 MG TOTAL) BY MOUTH AT BEDTIME. TO RELAX MUSCLES   diclofenac 75 MG EC tablet Commonly known as: VOLTAREN Take 1 tablet (75 mg total) by mouth 2 (two) times daily. For muscle and  Joint pain   fluticasone 50 MCG/ACT nasal spray Commonly known as: FLONASE Place 1 spray into both nostrils 2 (two) times daily.   gabapentin 300 MG capsule Commonly known as: NEURONTIN Take 1 capsule (300 mg total) by mouth 3 (three) times daily. (Needs to be seen before next refill)   hydrochlorothiazide 25 MG tablet Commonly known as: HYDRODIURIL  Take 1 tablet (25 mg total) by mouth daily.   levothyroxine 125 MCG tablet Commonly known as: SYNTHROID Take 1 tablet (125 mcg total) by mouth daily. (Needs to be seen before next refill)   LORazepam 0.5 MG tablet Commonly known as: ATIVAN TAKE 1 TABLET (0.5 MG) BY MOUTH AT BEDTIME AS NEEDED FOR ANXIETY   multivitamin with minerals tablet Take 1 tablet by mouth daily.   ondansetron 8 MG tablet Commonly known as: ZOFRAN Take  1 tablet by mouth every 8 hours as needed for nausea or vomiting.   QC TUMERIC COMPLEX PO Take 1 capsule by mouth daily.   sertraline 100 MG tablet Commonly known as: ZOLOFT Take 1 tablet (100 mg total) by mouth daily.   traZODone 100 MG tablet Commonly known as: DESYREL Take 100 mg by mouth at bedtime.   TURMERIC CURCUMIN PO Take by mouth.   vitamin B-12 100 MCG tablet Commonly known as: CYANOCOBALAMIN Take 100 mcg by mouth daily.   vitamin C 500 MG tablet Commonly known as: ASCORBIC ACID Take 500 mg by mouth daily.   VITAMIN D3 PO Take by mouth.   zinc gluconate 50 MG tablet Take 50 mg by mouth daily.        Objective:   BP 127/80   Pulse 77   Temp (!) 97.3 F (36.3 C)   Ht '5\' 1"'  (1.549 m)   Wt 223 lb (101.2 kg)   LMP  (LMP Unknown) Comment: FULL   SpO2 98%   BMI 42.14 kg/m   Wt Readings from Last 3 Encounters:  03/22/20 223 lb (101.2 kg)  10/27/19 216 lb 2 oz (98 kg)  10/25/19 216 lb 2 oz (98 kg)    Physical Exam Vitals and nursing note reviewed.  Constitutional:      General: She is not in acute distress.    Appearance: She is well-developed. She is not diaphoretic.  Eyes:     Conjunctiva/sclera: Conjunctivae normal.  Cardiovascular:     Rate and Rhythm: Normal rate and regular rhythm.     Heart sounds: Normal heart sounds. No murmur heard.   Pulmonary:     Effort: Pulmonary effort is normal. No respiratory distress.     Breath sounds: Normal breath sounds. No wheezing.  Musculoskeletal:        General: No tenderness. Normal range of motion.  Skin:    General: Skin is warm and dry.     Findings: No rash.  Neurological:     Mental Status: She is alert and oriented to person, place, and time.     Coordination: Coordination normal.  Psychiatric:        Behavior: Behavior normal.       Assessment & Plan:   Problem List Items Addressed This Visit      Cardiovascular and Mediastinum   Essential hypertension, benign   Relevant Orders     CMP14+EGFR   Lipid panel     Endocrine   Hypothyroidism - Primary   Relevant Orders   TSH     Other   Depression   Relevant Medications   traZODone (DESYREL) 100 MG tablet   LORazepam (ATIVAN) 0.5 MG tablet   Anemia   Relevant Orders   CBC with Differential/Platelet    Other Visit Diagnoses    Encounter for screening mammogram for malignant neoplasm of breast       Relevant Orders   MM 3D SCREEN BREAST BILATERAL   Encounter for hepatitis C screening test for low  risk patient       Relevant Orders   Hepatitis C antibody   Encounter for screening for HIV       Relevant Orders   HIV Antibody (routine testing w rflx)   Flu vaccine need       Relevant Orders   Flu Vaccine QUAD 6+ mos PF IM (Fluarix Quad PF)      Continue current medication, patient will schedule her mammogram. No changes, blood pressure looks great. We will check thyroid today. Follow up plan: Return in about 3 months (around 06/22/2020), or if symptoms worsen or fail to improve, for Thyroid and hypertension recheck.  Counseling provided for all of the vaccine components Orders Placed This Encounter  Procedures  . MM 3D SCREEN BREAST BILATERAL  . Flu Vaccine QUAD 6+ mos PF IM (Fluarix Quad PF)  . Hepatitis C antibody  . HIV Antibody (routine testing w rflx)    Caryl Pina, MD [facility] 03/22/2020, 9:54 AM

## 2020-03-23 LAB — CMP14+EGFR
ALT: 18 IU/L (ref 0–32)
AST: 16 IU/L (ref 0–40)
Albumin/Globulin Ratio: 1.3 (ref 1.2–2.2)
Albumin: 4.3 g/dL (ref 3.8–4.9)
Alkaline Phosphatase: 76 IU/L (ref 44–121)
BUN/Creatinine Ratio: 17 (ref 9–23)
BUN: 16 mg/dL (ref 6–24)
Bilirubin Total: 0.2 mg/dL (ref 0.0–1.2)
CO2: 25 mmol/L (ref 20–29)
Calcium: 9.5 mg/dL (ref 8.7–10.2)
Chloride: 101 mmol/L (ref 96–106)
Creatinine, Ser: 0.94 mg/dL (ref 0.57–1.00)
GFR calc Af Amer: 80 mL/min/{1.73_m2} (ref >59)
GFR calc non Af Amer: 69 mL/min/{1.73_m2} (ref >59)
Globulin, Total: 3.2 g/dL (ref 1.5–4.5)
Glucose: 97 mg/dL (ref 65–99)
Potassium: 4.1 mmol/L (ref 3.5–5.2)
Sodium: 140 mmol/L (ref 134–144)
Total Protein: 7.5 g/dL (ref 6.0–8.5)

## 2020-03-23 LAB — CBC WITH DIFFERENTIAL/PLATELET
Basophils Absolute: 0 10*3/uL (ref 0.0–0.2)
Basos: 0 %
EOS (ABSOLUTE): 0.3 10*3/uL (ref 0.0–0.4)
Eos: 4 %
Hematocrit: 35.9 % (ref 34.0–46.6)
Hemoglobin: 12.1 g/dL (ref 11.1–15.9)
Immature Grans (Abs): 0 10*3/uL (ref 0.0–0.1)
Immature Granulocytes: 0 %
Lymphocytes Absolute: 1.4 10*3/uL (ref 0.7–3.1)
Lymphs: 19 %
MCH: 30.4 pg (ref 26.6–33.0)
MCHC: 33.7 g/dL (ref 31.5–35.7)
MCV: 90 fL (ref 79–97)
Monocytes Absolute: 0.5 10*3/uL (ref 0.1–0.9)
Monocytes: 7 %
Neutrophils Absolute: 5.1 10*3/uL (ref 1.4–7.0)
Neutrophils: 70 %
Platelets: 379 10*3/uL (ref 150–450)
RBC: 3.98 x10E6/uL (ref 3.77–5.28)
RDW: 13.2 % (ref 11.7–15.4)
WBC: 7.2 10*3/uL (ref 3.4–10.8)

## 2020-03-23 LAB — TSH: TSH: 8.5 u[IU]/mL — ABNORMAL HIGH (ref 0.450–4.500)

## 2020-03-23 LAB — LIPID PANEL
Chol/HDL Ratio: 3.6 ratio (ref 0.0–4.4)
Cholesterol, Total: 185 mg/dL (ref 100–199)
HDL: 52 mg/dL (ref >39)
LDL Chol Calc (NIH): 95 mg/dL (ref 0–99)
Triglycerides: 225 mg/dL — ABNORMAL HIGH (ref 0–149)
VLDL Cholesterol Cal: 38 mg/dL (ref 5–40)

## 2020-03-23 LAB — HIV ANTIBODY (ROUTINE TESTING W REFLEX): HIV Screen 4th Generation wRfx: NONREACTIVE

## 2020-03-23 LAB — HEPATITIS C ANTIBODY: Hep C Virus Ab: <0.1 s/co ratio (ref 0.0–0.9)

## 2020-03-24 ENCOUNTER — Other Ambulatory Visit: Payer: Self-pay | Admitting: Family Medicine

## 2020-03-27 LAB — TOXASSURE SELECT 13 (MW), URINE

## 2020-03-28 ENCOUNTER — Other Ambulatory Visit: Payer: Self-pay | Admitting: *Deleted

## 2020-03-28 DIAGNOSIS — E890 Postprocedural hypothyroidism: Secondary | ICD-10-CM

## 2020-03-28 MED ORDER — LEVOTHYROXINE SODIUM 137 MCG PO TABS: 137.0000 ug | ORAL_TABLET | Freq: Every day | ORAL | 5 refills | Status: DC

## 2020-04-12 ENCOUNTER — Ambulatory Visit: Payer: Medicare HMO

## 2020-04-14 ENCOUNTER — Other Ambulatory Visit: Payer: Self-pay | Admitting: Family Medicine

## 2020-04-14 DIAGNOSIS — I10 Essential (primary) hypertension: Secondary | ICD-10-CM

## 2020-04-19 ENCOUNTER — Other Ambulatory Visit: Payer: Self-pay | Admitting: Family Medicine

## 2020-04-23 ENCOUNTER — Other Ambulatory Visit: Payer: Self-pay | Admitting: Family Medicine

## 2020-04-24 ENCOUNTER — Other Ambulatory Visit: Payer: Self-pay | Admitting: Family Medicine

## 2020-04-24 DIAGNOSIS — F331 Major depressive disorder, recurrent, moderate: Secondary | ICD-10-CM

## 2020-04-29 ENCOUNTER — Other Ambulatory Visit: Payer: Self-pay | Admitting: Family Medicine

## 2020-04-29 DIAGNOSIS — F331 Major depressive disorder, recurrent, moderate: Secondary | ICD-10-CM

## 2020-04-29 DIAGNOSIS — I10 Essential (primary) hypertension: Secondary | ICD-10-CM

## 2020-05-01 ENCOUNTER — Ambulatory Visit: Payer: Medicare HMO

## 2020-05-22 ENCOUNTER — Telehealth: Payer: Self-pay

## 2020-05-23 ENCOUNTER — Telehealth: Payer: Self-pay | Admitting: Family Medicine

## 2020-05-23 ENCOUNTER — Other Ambulatory Visit: Payer: Self-pay | Admitting: *Deleted

## 2020-05-23 DIAGNOSIS — F331 Major depressive disorder, recurrent, moderate: Secondary | ICD-10-CM

## 2020-05-23 MED ORDER — GABAPENTIN 300 MG PO CAPS: 300.0000 mg | ORAL_CAPSULE | Freq: Three times a day (TID) | ORAL | 0 refills | Status: DC

## 2020-05-23 NOTE — Telephone Encounter (Signed)
Call to pt, she is not requesting this back brace Call back to [facility] to inform them

## 2020-05-23 NOTE — Telephone Encounter (Signed)
Pt is having pain in left foot and it is shooting through toes

## 2020-05-23 NOTE — Telephone Encounter (Signed)
Patient aware no appointments - advised to go to urgent care.

## 2020-05-26 ENCOUNTER — Other Ambulatory Visit: Payer: Self-pay | Admitting: Family Medicine

## 2020-05-26 DIAGNOSIS — F331 Major depressive disorder, recurrent, moderate: Secondary | ICD-10-CM

## 2020-06-03 ENCOUNTER — Ambulatory Visit (INDEPENDENT_AMBULATORY_CARE_PROVIDER_SITE_OTHER): Payer: Medicare Other | Admitting: *Deleted

## 2020-06-03 DIAGNOSIS — Z0000 Encounter for general adult medical examination without abnormal findings: Secondary | ICD-10-CM

## 2020-06-03 NOTE — Progress Notes (Addendum)
MEDICARE ANNUAL WELLNESS VISIT  06/03/2020  Telephone Visit Disclaimer This Medicare AWV was conducted by telephone due to national recommendations for restrictions regarding the COVID-19 Pandemic (e.g. social distancing).  I verified, using two identifiers, that I am speaking with Destiny [name] or their authorized healthcare agent. I discussed the limitations, risks, security, and privacy concerns of performing an evaluation and management service by telephone and the potential availability of an in-person appointment in the future. The patient expressed understanding and agreed to proceed.  Location of Patient: Home Location of Provider (nurse):  [facility]  Subjective:    Destiny [name] is a 55 y.o. female patient of Dettinger, Destiny [name] who had a Medicare Annual Wellness Visit today via telephone. [name] is Disabled and her grandson lives with her. she has 3 children. she reports that she is socially active and does interact with friends/family regularly. she is not physically active and enjoys watching movies, spending time with grandchildren, sitting by the pool, and spending time with her dog.  Patient Care Team: Dettinger, Destiny [name] as PCP - General (Family Medicine) [name], Destiny [place], [name] as Referring Physician (Gastroenterology) Destiny Pier, [name] as Consulting Physician (Oncology)  Advanced Directives 06/03/2020 06/19/2019 05/13/2017 04/12/2017 10/21/2016 10/20/2016 09/02/2016  Does Patient Have a Medical Advance Directive? _0  No No  Would patient like information on creating a medical advance directive? Yes (MAU/Ambulatory/Procedural Areas - Information given) No - Patient declined No - Patient declined - No - Patient declined No - Patient declined No - Patient declined    Hospital Utilization Over the Past 12 Months: # of hospitalizations or ER visits: 0 # of surgeries: 0  Review of Systems    Patient reports that her overall health is  unchanged compared to last year.  History obtained from chart review and the patient Musculoskeletal ROS: positive for - pain in back - lower  Patient Reported Readings (BP, Pulse, CBG, Weight, etc) none  Pain Assessment Pain : 0-10 Pain Score: 8  Pain Type: Chronic pain Pain Location: Back Pain Orientation: Lower Pain Descriptors / Indicators: Aching Pain Onset: More than a month ago Pain Frequency: Constant Pain Relieving Factors: Diclofenac Effect of Pain on Daily Activities: She is able to complete what is needed  Pain Relieving Factors: Diclofenac  Current Medications & Allergies (verified) Allergies as of 06/03/2020      Reactions   Latex Itching, Rash      Medication List       Accurate as of June 03, 2020  9:06 AM. If you have any questions, ask your nurse or doctor.        STOP taking these medications   ciprofloxacin 500 MG tablet Commonly known as: Cipro   cyclobenzaprine 10 MG tablet Commonly known as: FLEXERIL     TAKE these medications   acetaminophen 500 MG tablet Commonly known as: TYLENOL Take 1,000 mg by mouth every 8 (eight) hours as needed for moderate pain.   diclofenac 75 MG EC tablet Commonly known as: VOLTAREN Take 1 tab twice daily for muscle and joint pain   fluticasone 50 MCG/ACT nasal spray Commonly known as: FLONASE Place 1 spray into both nostrils 2 (two) times daily.   gabapentin 300 MG capsule Commonly known as: NEURONTIN Take 1 capsule (300 mg total) by mouth 3 (three) times daily.   hydrochlorothiazide 25 MG tablet Commonly known as: HYDRODIURIL TAKE 1 TABLET BY MOUTH EVERY DAY  levothyroxine 137 MCG tablet Commonly known as: SYNTHROID Take 1 tablet (137 mcg total) by mouth daily before breakfast.   LORazepam 0.5 MG tablet Commonly known as: ATIVAN TAKE 1 TABLET (0.5 MG) BY MOUTH AT BEDTIME AS NEEDED FOR ANXIETY   multivitamin with minerals tablet Take 1 tablet by mouth daily.   ondansetron 8 MG  tablet Commonly known as: ZOFRAN Take 1 tablet by mouth every 8 hours as needed for nausea or vomiting.   QC TUMERIC COMPLEX PO Take 1 capsule by mouth daily.   sertraline 100 MG tablet Commonly known as: ZOLOFT TAKE 1 TABLET BY MOUTH EVERY DAY   traZODone 100 MG tablet Commonly known as: DESYREL Take 100 mg by mouth at bedtime.   vitamin B-12 100 MCG tablet Commonly known as: CYANOCOBALAMIN Take 100 mcg by mouth daily.   vitamin C 500 MG tablet Commonly known as: ASCORBIC ACID Take 500 mg by mouth daily.   VITAMIN D3 PO Take by mouth.   zinc gluconate 50 MG tablet Take 50 mg by mouth daily.       History (reviewed): Past Medical History:  Diagnosis Date  . Chronic back pain   . Depression   . Hypertension   . Hyperthyroidism   . Rectal cancer ([place]) 10/29/2015   rectal invasice adenocarcinoma,Adenocarcinoma dx 10/29/15; Stage III, T3, N1, MO   Past Surgical History:  Procedure Laterality Date  . ABDOMINAL HYSTERECTOMY     vaginal   . BLADDER SURGERY    . EYE SURGERY     LAZY EYE CORRECTION  . ILEOSTOMY    . ILEOSTOMY CLOSURE N/A 09/02/2016   Procedure: ILEOSTOMY REVERSAL;  Surgeon: Leighton Ruff, [name];  Location: [facility];  Service: General;  Laterality: N/A;  . RECTAL EXAM UNDER ANESTHESIA N/A 09/02/2016   Procedure: ANAL EXAM UNDER ANESTHESIA;  Surgeon: Leighton Ruff, [name];  Location: [facility];  Service: General;  Laterality: N/A;  . URETERAL REIMPLANTION Left 02/26/2016   Procedure: left ureteral neocystotomy,double J stent placement, closure vaginal cuff;  Surgeon: Ardis Hughs, [name];  Location: [facility];  Service: Urology;  Laterality: Left;  Marland Kitchen VAGINAL HYSTERECTOMY    . XI ROBOTIC ASSISTED LOWER ANTERIOR RESECTION N/A 02/26/2016   Procedure: XI ROBOTIC ASSISTED LOWER ANTERIOR RESECTION, diverting LOOP ILEOSTOMY, mobilization splenic flexure, omentopexy;  Surgeon: Leighton Ruff, [name];  Location: [facility];  Service: General;  Laterality: N/A;   Family History  Problem  Relation Age of Onset  . Thyroid disease Mother   . Arthritis Mother   . Lung cancer Father        lung  . Cirrhosis Brother   . Migraines Sister   . Depression Sister   . Migraines Daughter    Social History   Socioeconomic History  . Marital status: Divorced    Spouse name: Not on file  . Number of children: 3  . Years of education: Not on file  . Highest education level: Some college, no degree  Occupational History  . Occupation: Disabled  Tobacco Use  . Smoking status: Never Smoker  . Smokeless tobacco: Never Used  Vaping Use  . Vaping Use: Never used  Substance and Sexual Activity  . Alcohol use: No    Alcohol/week: 0.0 standard drinks    Comment: occ  . Drug use: No  . Sexual activity: Yes    Birth control/protection: Surgical  Other Topics Concern  . Not on file  Social History Narrative  . Not on file   Social Determinants of  Health   Financial Resource Strain: Not on file  Food Insecurity: Not on file  Transportation Needs: Not on file  Physical Activity: Not on file  Stress: Not on file  Social Connections: Not on file    Activities of Daily Living In your present state of health, do you have any difficulty performing the following activities: 06/03/2020  Hearing? N  Vision? N  Comment wears reading glasses  Difficulty concentrating or making decisions? N  Walking or climbing stairs? N  Dressing or bathing? N  Doing errands, shopping? N  Preparing Food and eating ? N  Using the Toilet? N  In the past six months, have you accidently leaked urine? Y  Do you have problems with loss of bowel control? Y  Comment Due to colon removal  Managing your Medications? N  Managing your Finances? N  Housekeeping or managing your Housekeeping? N  Some recent data might be hidden    Patient Education/ Literacy How often do you need to have someone help you when you read instructions, pamphlets, or other written materials from your doctor or pharmacy?: 1 -  Never What is the last grade level you completed in school?: Some college  Exercise Current Exercise Habits: The patient does not participate in regular exercise at present, Exercise limited by: None identified  Diet Patient reports consuming 2 meals a day and 3 snack(s) a day Patient reports that her primary diet is: Regular Patient reports that she does have regular access to food.   Depression Screen PHQ 2/9 Scores 06/03/2020 03/22/2020 10/25/2019 09/05/2019 02/01/2019 06/10/2018 03/18/2018  PHQ - 2 Score _0 0 0 1 2  PHQ- 9 Score _1 - - - 9     Fall Risk Fall Risk  06/03/2020 03/22/2020 09/05/2019 02/01/2019 03/18/2018  Falls in the past year? 0 0 0 0 No  Number falls in past yr: - - 0 - -  Injury with Fall? - - 0 - -  Risk for fall due to : - - No Fall Risks - -  Follow up - - Falls evaluation completed - -     Objective:  Destiny [name] seemed alert and oriented and she participated appropriately during our telephone visit.  Blood Pressure Weight BMI  BP Readings from Last 3 Encounters:  03/22/20 127/80  10/27/19 105/60  10/25/19 106/65   Wt Readings from Last 3 Encounters:  03/22/20 223 lb (101.2 kg)  10/27/19 216 lb 2 oz (98 kg)  10/25/19 216 lb 2 oz (98 kg)   BMI Readings from Last 1 Encounters:  03/22/20 42.14 kg/m    *Unable to obtain current vital signs, weight, and BMI due to telephone visit type  Hearing/Vision  . Iylah did not seem to have difficulty with hearing/understanding during the telephone conversation . Reports that she has not had a formal eye exam by an eye care professional within the past year . Reports that she has not had a formal hearing evaluation within the past year *Unable to fully assess hearing and vision during telephone visit type  Cognitive Function: 6CIT Screen 06/03/2020  What Year? 0 points  What month? 0 points  What time? 0 points  Count back from 20 0 points  Months in reverse 0 points  Repeat phrase 0 points  Total Score 0    (Normal:0-7, Significant for Dysfunction: >8)  Normal Cognitive Function Screening: Yes   Immunization & Health Maintenance Record Immunization History  Administered Date(s) Administered  .  Hepatitis A 08/08/2007, 10/28/2007, 06/06/2009  . Hepatitis A, Adult 08/08/2007, 10/28/2007, 06/06/2009  . Hepatitis B 08/08/2007, 10/28/2007, 06/06/2009  . Influenza,inj,Quad PF,6+ Mos 02/19/2016, 05/07/2017, 03/18/2018, 04/07/2019, 03/22/2020  . Influenza-Unspecified 03/18/2018  . MMR 01/14/2001  . Moderna Sars-Covid-2 Vaccination 07/04/2019, 08/01/2019, 04/23/2020  . Td 01/14/2001, 11/02/2013  . Tdap 11/02/2013  . Zoster Recombinat (Shingrix) 01/24/2020, 04/05/2020    Health Maintenance  Topic Date Due  . MAMMOGRAM  10/10/2018  . COVID-19 Vaccine (4 - Booster for Moderna series) 10/21/2020  . PAP SMEAR-Modifier  01/31/2022  . COLONOSCOPY (Pts 45-32yr Insurance coverage will need to be confirmed)  08/17/2022  . TETANUS/TDAP  11/03/2023  . INFLUENZA VACCINE  Completed  . Hepatitis C Screening  Completed  . HIV Screening  Completed       Assessment  This is a routine wellness examination for MJearld [name]  Health Maintenance: Due or Overdue Health Maintenance Due  Topic Date Due  . MAMMOGRAM  10/10/2018    MJearld Adjutantdoes not need a referral for Community Assistance: Care Management:   no Social Work:    no Prescription Assistance:  no Nutrition/Diabetes Education:  no   Plan:  Personalized Goals Goals Addressed            This Visit's Progress   . AWV       06/03/2020 AWV Goal: Exercise for General Health   Patient will verbalize understanding of the benefits of increased physical activity:  Exercising regularly is important. It will improve your overall fitness, flexibility, and endurance.  Regular exercise also will improve your overall health. It can help you control your weight, reduce stress, and improve your bone density.  Over the next year, patient  will increase physical activity as tolerated with a goal of at least 150 minutes of moderate physical activity per week.   You can tell that you are exercising at a moderate intensity if your heart starts beating faster and you start breathing faster but can still hold a conversation.  Moderate-intensity exercise ideas include:  Walking 1 mile (1.6 km) in about 15 minutes  Biking  Hiking  Golfing  Dancing  Water aerobics  Patient will verbalize understanding of everyday activities that increase physical activity by providing examples like the following: ? Yard work, such as: ? Pushing a lConservation officer, nature? Raking and bagging leaves ? Washing your car ? Pushing a stroller ? Shoveling snow ? Gardening ? Washing windows or floors  Patient will be able to explain general safety guidelines for exercising:   Before you start a new exercise program, talk with your health care provider.  Do not exercise so much that you hurt yourself, feel dizzy, or get very short of breath.  Wear comfortable clothes and wear shoes with good support.  Drink plenty of water while you exercise to prevent dehydration or heat stroke.  Work out until your breathing and your heartbeat get faster.       Personalized Health Maintenance & Screening Recommendations  Yearly Eye exam  Lung Cancer Screening Recommended: no (Low Dose CT Chest recommended if Age 771-80years, 30 pack-year currently smoking OR have quit w/in past 15 years) Hepatitis C Screening recommended: no HIV Screening recommended: no  Advanced Directives: Written information was not prepared per patient's request.  Referrals & Orders No orders of the defined types were placed in this encounter.   Follow-up Plan . Follow-up with Dettinger, JFransisca Kaufmann [name] as planned . Schedule your yearly eye exam .  Mammogram appointment 07/25/19 . AVS printed and mailed to patient   I have personally reviewed and noted the following in the patient's  chart:   . Medical and social history . Use of alcohol, tobacco or illicit drugs  . Current medications and supplements . Functional ability and status . Nutritional status . Physical activity . Advanced directives . List of other physicians . Hospitalizations, surgeries, and ER visits in previous 12 months . Vitals . Screenings to include cognitive, depression, and falls . Referrals and appointments  In addition, I have reviewed and discussed with Destiny [name] certain preventive protocols, quality metrics, and best practice recommendations. A written personalized care plan for preventive services as well as general preventive health recommendations is available and can be mailed to the patient at her request.      Lynnea Ferrier, LPN  09/04/1899

## 2020-06-03 NOTE — Patient Instructions (Signed)
  [place] Maintenance Summary and Written Plan of Care  Ms. Stiger ,  Thank you for allowing me to perform your Medicare Annual Wellness Visit and for your ongoing commitment to your health.   Health Maintenance & Immunization History Health Maintenance  Topic Date Due  . MAMMOGRAM  10/10/2018  . COVID-19 Vaccine (4 - Booster for Moderna series) 10/21/2020  . PAP SMEAR-Modifier  01/31/2022  . COLONOSCOPY (Pts 45-74yr Insurance coverage will need to be confirmed)  08/17/2022  . TETANUS/TDAP  11/03/2023  . INFLUENZA VACCINE  Completed  . Hepatitis C Screening  Completed  . HIV Screening  Completed   Immunization History  Administered Date(s) Administered  . Hepatitis A 08/08/2007, 10/28/2007, 06/06/2009  . Hepatitis A, Adult 08/08/2007, 10/28/2007, 06/06/2009  . Hepatitis B 08/08/2007, 10/28/2007, 06/06/2009  . Influenza,inj,Quad PF,6+ Mos 02/19/2016, 05/07/2017, 03/18/2018, 04/07/2019, 03/22/2020  . Influenza-Unspecified 03/18/2018  . MMR 01/14/2001  . Moderna Sars-Covid-2 Vaccination 07/04/2019, 08/01/2019, 04/23/2020  . Td 01/14/2001, 11/02/2013  . Tdap 11/02/2013  . Zoster Recombinat (Shingrix) 01/24/2020, 04/05/2020    These are the patient goals that we discussed: Goals Addressed            This Visit's Progress   . AWV       06/03/2020 AWV Goal: Exercise for General Health   Patient will verbalize understanding of the benefits of increased physical activity:  Exercising regularly is important. It will improve your overall fitness, flexibility, and endurance.  Regular exercise also will improve your overall health. It can help you control your weight, reduce stress, and improve your bone density.  Over the next year, patient will increase physical activity as tolerated with a goal of at least 150 minutes of moderate physical activity per week.   You can tell that you are exercising at a moderate intensity if your heart starts beating  faster and you start breathing faster but can still hold a conversation.  Moderate-intensity exercise ideas include:  Walking 1 mile (1.6 km) in about 15 minutes  Biking  Hiking  Golfing  Dancing  Water aerobics  Patient will verbalize understanding of everyday activities that increase physical activity by providing examples like the following: ? Yard work, such as: ? Pushing a lConservation officer, nature? Raking and bagging leaves ? Washing your car ? Pushing a stroller ? Shoveling snow ? Gardening ? Washing windows or floors  Patient will be able to explain general safety guidelines for exercising:   Before you start a new exercise program, talk with your health care provider.  Do not exercise so much that you hurt yourself, feel dizzy, or get very short of breath.  Wear comfortable clothes and wear shoes with good support.  Drink plenty of water while you exercise to prevent dehydration or heat stroke.  Work out until your breathing and your heartbeat get faster.         This is a list of Health Maintenance Items that are overdue or due now: Health Maintenance Due  Topic Date Due  . MAMMOGRAM  10/10/2018     Orders/Referrals Placed Today: No orders of the defined types were placed in this encounter.  (Contact our referral department at [phone] you have not spoken with someone about your referral appointment within the next 5 days)    Follow-up Plan . Follow-up with Dettinger, JFransisca Kaufmann MD as planned . Schedule your yearly eye exam . Mammogram appointment 07/25/19

## 2020-06-18 ENCOUNTER — Other Ambulatory Visit: Payer: Self-pay | Admitting: Family Medicine

## 2020-06-19 ENCOUNTER — Other Ambulatory Visit: Payer: Self-pay | Admitting: Family Medicine

## 2020-06-19 DIAGNOSIS — Z1231 Encounter for screening mammogram for malignant neoplasm of breast: Secondary | ICD-10-CM

## 2020-06-24 ENCOUNTER — Telehealth: Payer: Self-pay

## 2020-06-24 ENCOUNTER — Ambulatory Visit: Payer: Medicare Other | Admitting: Family Medicine

## 2020-06-24 NOTE — Telephone Encounter (Signed)
Patient tested positive for Covid on Saturday and is wondering how many days she should quarantine.  I advised patient to quarantine at least 5 days, 10 days if symptomatic after the 5 days.

## 2020-07-15 ENCOUNTER — Ambulatory Visit (INDEPENDENT_AMBULATORY_CARE_PROVIDER_SITE_OTHER): Payer: Medicare Other

## 2020-07-15 ENCOUNTER — Encounter: Payer: Self-pay | Admitting: Family Medicine

## 2020-07-15 ENCOUNTER — Other Ambulatory Visit: Payer: Self-pay

## 2020-07-15 ENCOUNTER — Ambulatory Visit (INDEPENDENT_AMBULATORY_CARE_PROVIDER_SITE_OTHER): Payer: Medicare Other | Admitting: Family Medicine

## 2020-07-15 VITALS — BP 137/83 | HR 75 | Ht 61.0 in | Wt 228.0 lb

## 2020-07-15 DIAGNOSIS — M545 Low back pain, unspecified: Secondary | ICD-10-CM | POA: Diagnosis not present

## 2020-07-15 DIAGNOSIS — M7701 Medial epicondylitis, right elbow: Secondary | ICD-10-CM

## 2020-07-15 MED ORDER — METHYLPREDNISOLONE ACETATE 80 MG/ML IJ SUSP
80.0000 mg | Freq: Once | INTRAMUSCULAR | Status: AC
  Administered 2020-07-15: 80 mg via INTRAMUSCULAR

## 2020-07-15 NOTE — Progress Notes (Signed)
BP 137/83   Pulse 75   Ht 5\' 1"  (1.549 m)   Wt 228 lb (103.4 kg)   LMP  (LMP Unknown) Comment: FULL   SpO2 98%   BMI 43.08 kg/m    Subjective:   Patient ID: Destiny [name], female    DOB: [birth year], 55 y.o.   MRN: [id number]  HPI: Destiny [name] is a 55 y.o. female presenting on 07/15/2020 for Back Pain and Elbow Pain (right)   HPI Patient is coming in complaining of back pain in her lower back and on the right side worse than the left side as well and in the middle.  She says is been going on over the past couple months and she has tried oral Voltaren and heating pads and stretches and exercises that we gave her that she has been doing recurrently and it just does not seem to be helping.  She has had steroid injections for this in the past when she has had it and she would like to try steroid injection again today.  She is not had any imaging or x-rays of her back.  Patient also complains of right middle elbow pain that is been bothering her on the middle aspect of her elbow over the past week.  She does not recall any specific trauma or incident but does admit when asked that she has been doing a lot more pushing and pulling and using of that arm that she had previously because she is helping take care of her mother.  She says it is more of an achy sensation but sometimes can be sharp in nature.  Relevant past medical, surgical, family and social history reviewed and updated as indicated. Interim medical history since our last visit reviewed. Allergies and medications reviewed and updated.  Review of Systems  Constitutional: Negative for chills and fever.  Eyes: Negative for redness and visual disturbance.  Respiratory: Negative for chest tightness and shortness of breath.   Cardiovascular: Negative for chest pain and leg swelling.  Musculoskeletal: Positive for arthralgias and back pain. Negative for gait problem.  Skin: Negative for rash.  Neurological: Negative for dizziness,  light-headedness and headaches.  Psychiatric/Behavioral: Negative for agitation and behavioral problems.  All other systems reviewed and are negative.   Per HPI unless specifically indicated above   Allergies as of 07/15/2020      Reactions   Latex Itching, Rash      Medication List       Accurate as of July 15, 2020 11:18 AM. If you have any questions, ask your nurse or doctor.        acetaminophen 500 MG tablet Commonly known as: TYLENOL Take 1,000 mg by mouth every 8 (eight) hours as needed for moderate pain.   diclofenac 75 MG EC tablet Commonly known as: VOLTAREN Take 1 tab twice daily for muscle and joint pain   fluticasone 50 MCG/ACT nasal spray Commonly known as: FLONASE Place 1 spray into both nostrils 2 (two) times daily.   gabapentin 300 MG capsule Commonly known as: NEURONTIN Take 1 capsule (300 mg total) by mouth 3 (three) times daily.   hydrochlorothiazide 25 MG tablet Commonly known as: HYDRODIURIL TAKE 1 TABLET BY MOUTH EVERY DAY   levothyroxine 137 MCG tablet Commonly known as: SYNTHROID TAKE 1 TABLET BY MOUTH DAILY BEFORE BREAKFAST.   LORazepam 0.5 MG tablet Commonly known as: ATIVAN TAKE 1 TABLET (0.5 MG) BY MOUTH AT BEDTIME AS NEEDED FOR ANXIETY   multivitamin with  minerals tablet Take 1 tablet by mouth daily.   ondansetron 8 MG tablet Commonly known as: ZOFRAN Take 1 tablet by mouth every 8 hours as needed for nausea or vomiting.   QC TUMERIC COMPLEX PO Take 1 capsule by mouth daily.   sertraline 100 MG tablet Commonly known as: ZOLOFT TAKE 1 TABLET BY MOUTH EVERY DAY   traZODone 100 MG tablet Commonly known as: DESYREL Take 100 mg by mouth at bedtime.   vitamin B-12 100 MCG tablet Commonly known as: CYANOCOBALAMIN Take 100 mcg by mouth daily.   vitamin C 500 MG tablet Commonly known as: ASCORBIC ACID Take 500 mg by mouth daily.   VITAMIN D3 PO Take by mouth.   zinc gluconate 50 MG tablet Take 50 mg by mouth  daily.        Objective:   BP 137/83   Pulse 75   Ht 5\' 1"  (1.549 m)   Wt 228 lb (103.4 kg)   LMP  (LMP Unknown) Comment: FULL   SpO2 98%   BMI 43.08 kg/m   Wt Readings from Last 3 Encounters:  07/15/20 228 lb (103.4 kg)  03/22/20 223 lb (101.2 kg)  10/27/19 216 lb 2 oz (98 kg)    Physical Exam Vitals and nursing note reviewed.  Constitutional:      General: She is not in acute distress.    Appearance: She is well-developed and well-nourished. She is not diaphoretic.  Eyes:     Extraocular Movements: EOM normal.  Cardiovascular:     Pulses: Intact distal pulses.  Musculoskeletal:        General: No edema. Normal range of motion.     Right elbow: No swelling, deformity or effusion. Tenderness present in medial epicondyle.     Lumbar back: Tenderness (Bilateral lumbar tenderness, worse on right) and bony tenderness present. Negative right straight leg raise test and negative left straight leg raise test.  Skin:    General: Skin is warm and dry.     Findings: No rash.  Neurological:     Mental Status: She is alert and oriented to person, place, and time.     Coordination: Coordination normal.  Psychiatric:        Mood and Affect: Mood and affect normal.        Behavior: Behavior normal.       Assessment & Plan:   Problem List Items Addressed This Visit   None   Visit Diagnoses    Lumbar pain    -  Primary   Relevant Medications   methylPREDNISolone acetate (DEPO-MEDROL) injection 80 mg (Start on 07/15/2020 11:45 AM)   Other Relevant Orders   DG Lumbar Spine 2-3 Views   Medial epicondylitis of elbow, right       Relevant Medications   methylPREDNISolone acetate (DEPO-MEDROL) injection 80 mg (Start on 07/15/2020 11:45 AM)      Patient says steroid injections worked well for her before, will try that again and recommended continue doing the exercises.  We will also do x-ray. Follow up plan: Return if symptoms worsen or fail to improve.  Counseling provided  for all of the vaccine components No orders of the defined types were placed in this encounter.   Caryl Pina, MD [facility] 07/15/2020, 11:18 AM

## 2020-07-18 ENCOUNTER — Other Ambulatory Visit: Payer: Self-pay

## 2020-07-18 ENCOUNTER — Other Ambulatory Visit: Payer: Self-pay | Admitting: Family Medicine

## 2020-07-18 DIAGNOSIS — N12 Tubulo-interstitial nephritis, not specified as acute or chronic: Secondary | ICD-10-CM

## 2020-07-18 DIAGNOSIS — D649 Anemia, unspecified: Secondary | ICD-10-CM

## 2020-07-18 DIAGNOSIS — I10 Essential (primary) hypertension: Secondary | ICD-10-CM

## 2020-07-18 DIAGNOSIS — R109 Unspecified abdominal pain: Secondary | ICD-10-CM

## 2020-08-12 ENCOUNTER — Other Ambulatory Visit: Payer: Self-pay | Admitting: *Deleted

## 2020-08-12 DIAGNOSIS — F331 Major depressive disorder, recurrent, moderate: Secondary | ICD-10-CM

## 2020-08-12 DIAGNOSIS — I10 Essential (primary) hypertension: Secondary | ICD-10-CM

## 2020-08-12 MED ORDER — HYDROCHLOROTHIAZIDE 25 MG PO TABS: 25.0000 mg | ORAL_TABLET | Freq: Every day | ORAL | 0 refills | Status: DC

## 2020-08-12 MED ORDER — SERTRALINE HCL 100 MG PO TABS: 100.0000 mg | ORAL_TABLET | Freq: Every day | ORAL | 0 refills | Status: DC

## 2020-08-12 MED ORDER — GABAPENTIN 300 MG PO CAPS: 300.0000 mg | ORAL_CAPSULE | Freq: Three times a day (TID) | ORAL | 0 refills | Status: DC

## 2020-08-28 ENCOUNTER — Ambulatory Visit
Admission: RE | Admit: 2020-08-28 | Discharge: 2020-08-28 | Disposition: A | Discharge status: Home / Self Care | Payer: Medicare Other | Source: Ambulatory Visit | Attending: Family Medicine | Admitting: Family Medicine

## 2020-08-28 ENCOUNTER — Other Ambulatory Visit: Payer: Self-pay

## 2020-08-28 DIAGNOSIS — Z1231 Encounter for screening mammogram for malignant neoplasm of breast: Secondary | ICD-10-CM

## 2020-09-14 ENCOUNTER — Other Ambulatory Visit: Payer: Self-pay | Admitting: Family Medicine

## 2020-09-14 DIAGNOSIS — I10 Essential (primary) hypertension: Secondary | ICD-10-CM

## 2020-09-20 DIAGNOSIS — M9903 Segmental and somatic dysfunction of lumbar region: Secondary | ICD-10-CM | POA: Diagnosis not present

## 2020-09-20 DIAGNOSIS — S338XXA Sprain of other parts of lumbar spine and pelvis, initial encounter: Secondary | ICD-10-CM | POA: Diagnosis not present

## 2020-09-20 DIAGNOSIS — S233XXA Sprain of ligaments of thoracic spine, initial encounter: Secondary | ICD-10-CM | POA: Diagnosis not present

## 2020-09-20 DIAGNOSIS — M9901 Segmental and somatic dysfunction of cervical region: Secondary | ICD-10-CM | POA: Diagnosis not present

## 2020-09-20 DIAGNOSIS — S134XXA Sprain of ligaments of cervical spine, initial encounter: Secondary | ICD-10-CM | POA: Diagnosis not present

## 2020-09-20 DIAGNOSIS — M9902 Segmental and somatic dysfunction of thoracic region: Secondary | ICD-10-CM | POA: Diagnosis not present

## 2020-09-25 ENCOUNTER — Other Ambulatory Visit: Payer: Self-pay | Admitting: Family Medicine

## 2020-09-25 DIAGNOSIS — F331 Major depressive disorder, recurrent, moderate: Secondary | ICD-10-CM

## 2020-09-27 DIAGNOSIS — M9902 Segmental and somatic dysfunction of thoracic region: Secondary | ICD-10-CM | POA: Diagnosis not present

## 2020-09-27 DIAGNOSIS — M9903 Segmental and somatic dysfunction of lumbar region: Secondary | ICD-10-CM | POA: Diagnosis not present

## 2020-09-27 DIAGNOSIS — S233XXA Sprain of ligaments of thoracic spine, initial encounter: Secondary | ICD-10-CM | POA: Diagnosis not present

## 2020-09-27 DIAGNOSIS — S134XXA Sprain of ligaments of cervical spine, initial encounter: Secondary | ICD-10-CM | POA: Diagnosis not present

## 2020-09-27 DIAGNOSIS — S338XXA Sprain of other parts of lumbar spine and pelvis, initial encounter: Secondary | ICD-10-CM | POA: Diagnosis not present

## 2020-09-27 DIAGNOSIS — M9901 Segmental and somatic dysfunction of cervical region: Secondary | ICD-10-CM | POA: Diagnosis not present

## 2020-09-30 DIAGNOSIS — M9901 Segmental and somatic dysfunction of cervical region: Secondary | ICD-10-CM | POA: Diagnosis not present

## 2020-09-30 DIAGNOSIS — S134XXA Sprain of ligaments of cervical spine, initial encounter: Secondary | ICD-10-CM | POA: Diagnosis not present

## 2020-09-30 DIAGNOSIS — S233XXA Sprain of ligaments of thoracic spine, initial encounter: Secondary | ICD-10-CM | POA: Diagnosis not present

## 2020-09-30 DIAGNOSIS — M9903 Segmental and somatic dysfunction of lumbar region: Secondary | ICD-10-CM | POA: Diagnosis not present

## 2020-09-30 DIAGNOSIS — M9902 Segmental and somatic dysfunction of thoracic region: Secondary | ICD-10-CM | POA: Diagnosis not present

## 2020-10-04 ENCOUNTER — Other Ambulatory Visit: Payer: Medicare Other

## 2020-10-04 ENCOUNTER — Ambulatory Visit (INDEPENDENT_AMBULATORY_CARE_PROVIDER_SITE_OTHER): Payer: Medicare Other | Admitting: Nurse Practitioner

## 2020-10-04 ENCOUNTER — Encounter: Payer: Self-pay | Admitting: Nurse Practitioner

## 2020-10-04 ENCOUNTER — Ambulatory Visit (INDEPENDENT_AMBULATORY_CARE_PROVIDER_SITE_OTHER): Payer: Medicare Other | Admitting: Family Medicine

## 2020-10-04 ENCOUNTER — Other Ambulatory Visit: Payer: Self-pay

## 2020-10-04 VITALS — BP 142/85 | HR 78 | Temp 99.1°F | Resp 20 | Ht 61.0 in | Wt 223.0 lb

## 2020-10-04 DIAGNOSIS — M9901 Segmental and somatic dysfunction of cervical region: Secondary | ICD-10-CM | POA: Diagnosis not present

## 2020-10-04 DIAGNOSIS — I10 Essential (primary) hypertension: Secondary | ICD-10-CM

## 2020-10-04 DIAGNOSIS — M545 Low back pain, unspecified: Secondary | ICD-10-CM

## 2020-10-04 DIAGNOSIS — M9903 Segmental and somatic dysfunction of lumbar region: Secondary | ICD-10-CM | POA: Diagnosis not present

## 2020-10-04 DIAGNOSIS — S233XXA Sprain of ligaments of thoracic spine, initial encounter: Secondary | ICD-10-CM | POA: Diagnosis not present

## 2020-10-04 DIAGNOSIS — S338XXA Sprain of other parts of lumbar spine and pelvis, initial encounter: Secondary | ICD-10-CM | POA: Diagnosis not present

## 2020-10-04 DIAGNOSIS — S134XXA Sprain of ligaments of cervical spine, initial encounter: Secondary | ICD-10-CM | POA: Diagnosis not present

## 2020-10-04 DIAGNOSIS — M9902 Segmental and somatic dysfunction of thoracic region: Secondary | ICD-10-CM | POA: Diagnosis not present

## 2020-10-04 DIAGNOSIS — E890 Postprocedural hypothyroidism: Secondary | ICD-10-CM

## 2020-10-04 MED ORDER — METHYLPREDNISOLONE ACETATE 40 MG/ML IJ SUSP
80.0000 mg | Freq: Once | INTRAMUSCULAR | Status: AC
  Administered 2020-10-04: 80 mg via INTRAMUSCULAR

## 2020-10-04 NOTE — Progress Notes (Signed)
   Subjective:    Patient ID: Destiny [name], female    DOB: Jul 23, 1965, 55 y.o.   MRN: [id number]    Chief Complaint: Back Pain (Low back/)   HPI Patient comes in today c/o low back pain. Pain started on several years ago and has been intermittent. It recently started hurting about 1 month ago and is getting worse. Denies any injury. Rates pain 7/10 currently. Pain is located in mid lower back and does not radiate. Bending over increase pain. Tylenol helps some.    Review of Systems  Constitutional: Negative for diaphoresis.  Eyes: Negative for pain.  Respiratory: Negative for shortness of breath.   Cardiovascular: Negative for chest pain, palpitations and leg swelling.  Gastrointestinal: Negative for abdominal pain.  Endocrine: Negative for polydipsia.  Musculoskeletal: Positive for back pain.  Skin: Negative for rash.  Neurological: Negative for dizziness, weakness and headaches.  Hematological: Does not bruise/bleed easily.  All other systems reviewed and are negative.      Objective:   Physical Exam Vitals and nursing note reviewed.  Constitutional:      Appearance: Normal appearance.  Cardiovascular:     Rate and Rhythm: Normal rate and regular rhythm.     Heart sounds: Normal heart sounds.  Pulmonary:     Effort: Pulmonary effort is normal.     Breath sounds: Normal breath sounds.  Musculoskeletal:     Comments: Rises slowly form sitting to standing. Decrease ROM of lumbar spine with pain on flexion and rotation.  (-) SLR bil Motor strength  And sensation distally intact.  Skin:    General: Skin is warm.  Neurological:     General: No focal deficit present.     Mental Status: She is alert and oriented to person, place, and time.  Psychiatric:        Mood and Affect: Mood normal.        Behavior: Behavior normal.     BP (!) 142/85   Pulse 78   Temp 99.1 F (37.3 C) (Temporal)   Resp 20   Ht 5\' 1"  (1.549 m)   Wt 223 lb (101.2 kg)   LMP  (LMP Unknown)  Comment: FULL   SpO2 100%   BMI 42.14 kg/m        Assessment & Plan:  Destiny [name] in today with chief complaint of Back Pain (Low back/)   1. Acute midline low back pain without sciatica Moist heat Rest RTO prn - methylPREDNISolone acetate (DEPO-MEDROL) injection 80 mg    The above assessment and management plan was discussed with the patient. The patient verbalized understanding of and has agreed to the management plan. Patient is aware to call the clinic if symptoms persist or worsen. Patient is aware when to return to the clinic for a follow-up visit. Patient educated on when it is appropriate to go to the emergency department.   Idona-Margaret Hassell Done, FNP

## 2020-10-04 NOTE — Patient Instructions (Signed)
Acute Back Pain, Adult Acute back pain is sudden and usually short-lived. It is often caused by an injury to the muscles and tissues in the back. The injury may result from:  A muscle or ligament getting overstretched or torn (strained). Ligaments are tissues that connect bones to each other. Lifting something improperly can cause a back strain.  Wear and tear (degeneration) of the spinal disks. Spinal disks are circular tissue that provide cushioning between the bones of the spine (vertebrae).  Twisting motions, such as while playing sports or doing yard work.  A hit to the back.  Arthritis. You may have a physical exam, lab tests, and imaging tests to find the cause of your pain. Acute back pain usually goes away with rest and home care. Follow these instructions at home: Managing pain, stiffness, and swelling  Treatment may include medicines for pain and inflammation that are taken by mouth or applied to the skin, prescription pain medicine, or muscle relaxants. Take over-the-counter and prescription medicines only as told by your health care provider.  Your health care provider may recommend applying ice during the first 24-48 hours after your pain starts. To do this: ? Put ice in a plastic bag. ? Place a towel between your skin and the bag. ? Leave the ice on for 20 minutes, 2-3 times a day.  If directed, apply heat to the affected area as often as told by your health care provider. Use the heat source that your health care provider recommends, such as a moist heat pack or a heating pad. ? Place a towel between your skin and the heat source. ? Leave the heat on for 20-30 minutes. ? Remove the heat if your skin turns bright red. This is especially important if you are unable to feel pain, heat, or cold. You have a greater risk of getting burned. Activity  Do not stay in bed. Staying in bed for more than 1-2 days can delay your recovery.  Sit up and stand up straight. Avoid leaning  forward when you sit or hunching over when you stand. ? If you work at a desk, sit close to it so you do not need to lean over. Keep your chin tucked in. Keep your neck drawn back, and keep your elbows bent at a 90-degree angle (right angle). ? Sit high and close to the steering wheel when you drive. Add lower back (lumbar) support to your car seat, if needed.  Take short walks on even surfaces as soon as you are able. Try to increase the length of time you walk each day.  Do not sit, drive, or stand in one place for more than 30 minutes at a time. Sitting or standing for long periods of time can put stress on your back.  Do not drive or use heavy machinery while taking prescription pain medicine.  Use proper lifting techniques. When you bend and lift, use positions that put less stress on your back: ? Bend your knees. ? Keep the load close to your body. ? Avoid twisting.  Exercise regularly as told by your health care provider. Exercising helps your back heal faster and helps prevent back injuries by keeping muscles strong and flexible.  Work with a physical therapist to make a safe exercise program, as recommended by your health care provider. Do any exercises as told by your physical therapist.   Lifestyle  Maintain a healthy weight. Extra weight puts stress on your back and makes it difficult to have   good posture.  Avoid activities or situations that make you feel anxious or stressed. Stress and anxiety increase muscle tension and can make back pain worse. Learn ways to manage anxiety and stress, such as through exercise. General instructions  Sleep on a firm mattress in a comfortable position. Try lying on your side with your knees slightly bent. If you lie on your back, put a pillow under your knees.  Follow your treatment plan as told by your health care provider. This may include: ? Cognitive or behavioral therapy. ? Acupuncture or massage therapy. ? Meditation or yoga. Contact  a health care provider if:  You have pain that is not relieved with rest or medicine.  You have increasing pain going down into your legs or buttocks.  Your pain does not improve after 2 weeks.  You have pain at night.  You lose weight without trying.  You have a fever or chills. Get help right away if:  You develop new bowel or bladder control problems.  You have unusual weakness or numbness in your arms or legs.  You develop nausea or vomiting.  You develop abdominal pain.  You feel faint. Summary  Acute back pain is sudden and usually short-lived.  Use proper lifting techniques. When you bend and lift, use positions that put less stress on your back.  Take over-the-counter and prescription medicines and apply heat or ice as directed by your health care provider. This information is not intended to replace advice given to you by your health care provider. Make sure you discuss any questions you have with your health care provider. Document Revised: 02/09/2020 Document Reviewed: 02/09/2020 Elsevier Patient Education  2021 [employer]vier Inc.  

## 2020-10-05 LAB — CMP14+EGFR
ALT: 17 IU/L (ref 0–32)
AST: 16 IU/L (ref 0–40)
Albumin/Globulin Ratio: 1.4 (ref 1.2–2.2)
Albumin: 4.3 g/dL (ref 3.8–4.9)
Alkaline Phosphatase: 78 IU/L (ref 44–121)
BUN/Creatinine Ratio: 20 (ref 9–23)
BUN: 18 mg/dL (ref 6–24)
Bilirubin Total: <0.2 mg/dL (ref 0.0–1.2)
CO2: 21 mmol/L (ref 20–29)
Calcium: 9.6 mg/dL (ref 8.7–10.2)
Chloride: 100 mmol/L (ref 96–106)
Creatinine, Ser: 0.92 mg/dL (ref 0.57–1.00)
Globulin, Total: 3.1 g/dL (ref 1.5–4.5)
Glucose: 148 mg/dL — ABNORMAL HIGH (ref 65–99)
Potassium: 4.3 mmol/L (ref 3.5–5.2)
Sodium: 138 mmol/L (ref 134–144)
Total Protein: 7.4 g/dL (ref 6.0–8.5)
eGFR: 74 mL/min/{1.73_m2} (ref >59)

## 2020-10-05 LAB — CBC WITH DIFFERENTIAL/PLATELET
Basophils Absolute: 0 10*3/uL (ref 0.0–0.2)
Basos: 1 %
EOS (ABSOLUTE): 0.3 10*3/uL (ref 0.0–0.4)
Eos: 4 %
Hematocrit: 35.4 % (ref 34.0–46.6)
Hemoglobin: 11.9 g/dL (ref 11.1–15.9)
Immature Grans (Abs): 0 10*3/uL (ref 0.0–0.1)
Immature Granulocytes: 1 %
Lymphocytes Absolute: 1.5 10*3/uL (ref 0.7–3.1)
Lymphs: 24 %
MCH: 29.6 pg (ref 26.6–33.0)
MCHC: 33.6 g/dL (ref 31.5–35.7)
MCV: 88 fL (ref 79–97)
Monocytes Absolute: 0.4 10*3/uL (ref 0.1–0.9)
Monocytes: 7 %
Neutrophils Absolute: 4 10*3/uL (ref 1.4–7.0)
Neutrophils: 63 %
Platelets: 398 10*3/uL (ref 150–450)
RBC: 4.02 x10E6/uL (ref 3.77–5.28)
RDW: 12.6 % (ref 11.7–15.4)
WBC: 6.3 10*3/uL (ref 3.4–10.8)

## 2020-10-05 LAB — LIPID PANEL
Chol/HDL Ratio: 3.6 ratio (ref 0.0–4.4)
Cholesterol, Total: 199 mg/dL (ref 100–199)
HDL: 55 mg/dL (ref >39)
LDL Chol Calc (NIH): 114 mg/dL — ABNORMAL HIGH (ref 0–99)
Triglycerides: 174 mg/dL — ABNORMAL HIGH (ref 0–149)
VLDL Cholesterol Cal: 30 mg/dL (ref 5–40)

## 2020-10-05 LAB — TSH: TSH: 5.47 u[IU]/mL — ABNORMAL HIGH (ref 0.450–4.500)

## 2020-10-07 ENCOUNTER — Ambulatory Visit (INDEPENDENT_AMBULATORY_CARE_PROVIDER_SITE_OTHER): Payer: Medicare Other | Admitting: Family Medicine

## 2020-10-07 ENCOUNTER — Encounter: Payer: Self-pay | Admitting: Family Medicine

## 2020-10-07 DIAGNOSIS — E890 Postprocedural hypothyroidism: Secondary | ICD-10-CM

## 2020-10-07 DIAGNOSIS — F331 Major depressive disorder, recurrent, moderate: Secondary | ICD-10-CM

## 2020-10-07 DIAGNOSIS — I10 Essential (primary) hypertension: Secondary | ICD-10-CM

## 2020-10-07 MED ORDER — LORAZEPAM 0.5 MG PO TABS: ORAL_TABLET | ORAL | 5 refills | Status: DC

## 2020-10-07 MED ORDER — BUPROPION HCL ER (XL) 150 MG PO TB24: 150.0000 mg | ORAL_TABLET | Freq: Every day | ORAL | 3 refills | Status: DC

## 2020-10-07 MED ORDER — LEVOTHYROXINE SODIUM 150 MCG PO TABS: 150.0000 ug | ORAL_TABLET | Freq: Every day | ORAL | 3 refills | Status: DC

## 2020-10-07 MED ORDER — SERTRALINE HCL 100 MG PO TABS: 1.0000 | ORAL_TABLET | Freq: Every day | ORAL | 3 refills | Status: DC

## 2020-10-07 MED ORDER — HYDROCHLOROTHIAZIDE 25 MG PO TABS: 25.0000 mg | ORAL_TABLET | Freq: Every day | ORAL | 3 refills | Status: DC

## 2020-10-07 MED ORDER — GABAPENTIN 300 MG PO CAPS: 300.0000 mg | ORAL_CAPSULE | Freq: Three times a day (TID) | ORAL | 3 refills | Status: DC

## 2020-10-07 MED ORDER — AMLODIPINE BESYLATE 5 MG PO TABS: 5.0000 mg | ORAL_TABLET | Freq: Every day | ORAL | 3 refills | Status: DC

## 2020-10-07 MED ORDER — CYCLOBENZAPRINE HCL 10 MG PO TABS: 10.0000 mg | ORAL_TABLET | Freq: Every evening | ORAL | 1 refills | Status: DC | PRN

## 2020-10-07 NOTE — Progress Notes (Signed)
Virtual Visit via telephone Note  I connected with Destiny [name] on 10/07/20 at 1521 by telephone and verified that I am speaking with the correct person using two identifiers. Destiny [name] is currently located at home and patient are currently with her during visit. The provider, Fransisca Kaufmann Chasta Deshpande, MD is located in their office at time of visit.  Call ended at 1534  I discussed the limitations, risks, security and privacy concerns of performing an evaluation and management service by telephone and the availability of in person appointments. I also discussed with the patient that there may be a patient responsible charge related to this service. The patient expressed understanding and agreed to proceed.  150/101,  History and Present Illness: Hypothyroidism recheck Patient is coming in for thyroid recheck today as well. They deny any issues with hair changes or heat or cold problems or diarrhea or constipation. They deny any chest pain or palpitations. They are currently on levothyroxine 170micrograms   Hypertension Patient is currently on hydrochlorathizide, and their blood pressure today is 150/101. Patient denies any lightheadedness or dizziness. Patient denies headaches, blurred vision, chest pains, shortness of breath, or weakness. Denies any side effects from medication and is content with current medication.   Anxiety and depression recheck Current rx-lorazepam 0.5 twice daily.  Also taking Zoloft and trazodone # meds rx- #60 Effectiveness of current meds-works well, only uses occasionally Adverse reactions form meds-none  Pill count performed-No Last drug screen -04/09/2020 ( high risk q31m, moderate risk q77m, low risk yearly ) Urine drug screen today- No Was the [place] reviewed-yes  If yes were their any concerning findings? -None  No flowsheet data found.   Controlled substance contract signed on: 04/09/2020    1. Postablative hypothyroidism   2. Essential hypertension,  benign   3. Moderate episode of recurrent major depressive disorder [facility])     Outpatient Encounter Medications as of 10/07/2020  Medication Sig  . amLODipine (NORVASC) 5 MG tablet Take 1 tablet (5 mg total) by mouth daily.  Marland Kitchen buPROPion (WELLBUTRIN XL) 150 MG 24 hr tablet Take 1 tablet (150 mg total) by mouth daily.  . cyclobenzaprine (FLEXERIL) 10 MG tablet Take 1 tablet (10 mg total) by mouth at bedtime as needed for muscle spasms.  Marland Kitchen acetaminophen (TYLENOL) 500 MG tablet Take 1,000 mg by mouth every 8 (eight) hours as needed for moderate pain.  . Cholecalciferol (VITAMIN D3 PO) Take by mouth.  . diclofenac (VOLTAREN) 75 MG EC tablet Take 1 tab twice daily for muscle and joint pain  . fluticasone (FLONASE) 50 MCG/ACT nasal spray Place 1 spray into both nostrils 2 (two) times daily.  Marland Kitchen gabapentin (NEURONTIN) 300 MG capsule Take 1 capsule (300 mg total) by mouth 3 (three) times daily.  . hydrochlorothiazide (HYDRODIURIL) 25 MG tablet Take 1 tablet (25 mg total) by mouth daily.  Marland Kitchen levothyroxine (SYNTHROID) 150 MCG tablet Take 1 tablet (150 mcg total) by mouth daily before breakfast.  . LORazepam (ATIVAN) 0.5 MG tablet TAKE 1 TABLET (0.5 MG) BY MOUTH AT BEDTIME AS NEEDED FOR ANXIETY  . Multiple Vitamins-Minerals (MULTIVITAMIN WITH MINERALS) tablet Take 1 tablet by mouth daily.  . ondansetron (ZOFRAN) 8 MG tablet Take 1 tablet by mouth every 8 hours as needed for nausea or vomiting.  . sertraline (ZOLOFT) 100 MG tablet Take 1 tablet (100 mg total) by mouth daily.  . traZODone (DESYREL) 100 MG tablet Take 100 mg by mouth at bedtime.  . Turmeric (QC TUMERIC COMPLEX  PO) Take 1 capsule by mouth daily.  . vitamin B-12 (CYANOCOBALAMIN) 100 MCG tablet Take 100 mcg by mouth daily.  . vitamin C (ASCORBIC ACID) 500 MG tablet Take 500 mg by mouth daily.  Marland Kitchen zinc gluconate 50 MG tablet Take 50 mg by mouth daily.  . [DISCONTINUED] gabapentin (NEURONTIN) 300 MG capsule TAKE 1 CAPSULE BY MOUTH THREE TIMES A DAY   . [DISCONTINUED] hydrochlorothiazide (HYDRODIURIL) 25 MG tablet Take 1 tablet (25 mg total) by mouth daily. (NEEDS TO BE SEEN BEFORE NEXT REFILL)  . [DISCONTINUED] levothyroxine (SYNTHROID) 137 MCG tablet TAKE 1 TABLET BY MOUTH DAILY BEFORE BREAKFAST.  . [DISCONTINUED] LORazepam (ATIVAN) 0.5 MG tablet TAKE 1 TABLET (0.5 MG) BY MOUTH AT BEDTIME AS NEEDED FOR ANXIETY  . [DISCONTINUED] sertraline (ZOLOFT) 100 MG tablet TAKE 1 TABLET BY MOUTH EVERY DAY   No facility-administered encounter medications on file as of 10/07/2020.    Review of Systems  Constitutional: Negative for chills and fever.  Eyes: Negative for visual disturbance.  Respiratory: Negative for chest tightness and shortness of breath.   Cardiovascular: Negative for chest pain and leg swelling.  Skin: Negative for rash.  Neurological: Negative for dizziness, light-headedness and headaches.  Psychiatric/Behavioral: Positive for dysphoric mood and sleep disturbance. Negative for agitation, behavioral problems, self-injury and suicidal ideas. The patient is nervous/anxious.   All other systems reviewed and are negative.   Observations/Objective: Patient sounds comfortable and in no acute distress  Assessment and Plan: Problem List Items Addressed This Visit      Cardiovascular and Mediastinum   Essential hypertension, benign   Relevant Medications   hydrochlorothiazide (HYDRODIURIL) 25 MG tablet   amLODipine (NORVASC) 5 MG tablet     Endocrine   Hypothyroidism - Primary   Relevant Medications   levothyroxine (SYNTHROID) 150 MCG tablet     Other   Depression   Relevant Medications   sertraline (ZOLOFT) 100 MG tablet   LORazepam (ATIVAN) 0.5 MG tablet   gabapentin (NEURONTIN) 300 MG capsule   buPROPion (WELLBUTRIN XL) 150 MG 24 hr tablet      Will start Wellbutrin along with her current medicines to see if that helps little more with her anxiety.  She still with a lot of issues with her mother and her dementia and  worsening and her mother is having hallucinations.  Thyroid is slightly low cells, increased dose of thyroid.  Sounds like her blood pressures running up a lot and will add amlodipine to her regimen. Follow up plan: Return if symptoms worsen or fail to improve, for 2-3 months recheck.     I discussed the assessment and treatment plan with the patient. The patient was provided an opportunity to ask questions and all were answered. The patient agreed with the plan and demonstrated an understanding of the instructions.   The patient was advised to call back or seek an in-person evaluation if the symptoms worsen or if the condition fails to improve as anticipated.  The above assessment and management plan was discussed with the patient. The patient verbalized understanding of and has agreed to the management plan. Patient is aware to call the clinic if symptoms persist or worsen. Patient is aware when to return to the clinic for a follow-up visit. Patient educated on when it is appropriate to go to the emergency department.    I provided 13 minutes of non-face-to-face time during this encounter.    Worthy Rancher, MD

## 2020-10-11 DIAGNOSIS — S338XXA Sprain of other parts of lumbar spine and pelvis, initial encounter: Secondary | ICD-10-CM | POA: Diagnosis not present

## 2020-10-11 DIAGNOSIS — M9901 Segmental and somatic dysfunction of cervical region: Secondary | ICD-10-CM | POA: Diagnosis not present

## 2020-10-11 DIAGNOSIS — M9903 Segmental and somatic dysfunction of lumbar region: Secondary | ICD-10-CM | POA: Diagnosis not present

## 2020-10-11 DIAGNOSIS — M9902 Segmental and somatic dysfunction of thoracic region: Secondary | ICD-10-CM | POA: Diagnosis not present

## 2020-10-11 DIAGNOSIS — S134XXA Sprain of ligaments of cervical spine, initial encounter: Secondary | ICD-10-CM | POA: Diagnosis not present

## 2020-10-11 DIAGNOSIS — S233XXA Sprain of ligaments of thoracic spine, initial encounter: Secondary | ICD-10-CM | POA: Diagnosis not present

## 2020-10-13 NOTE — Progress Notes (Signed)
Erroneous visit, patient had to leave before being seen.

## 2020-10-14 DIAGNOSIS — S233XXA Sprain of ligaments of thoracic spine, initial encounter: Secondary | ICD-10-CM | POA: Diagnosis not present

## 2020-10-14 DIAGNOSIS — M9902 Segmental and somatic dysfunction of thoracic region: Secondary | ICD-10-CM | POA: Diagnosis not present

## 2020-10-14 DIAGNOSIS — M9901 Segmental and somatic dysfunction of cervical region: Secondary | ICD-10-CM | POA: Diagnosis not present

## 2020-10-14 DIAGNOSIS — S134XXA Sprain of ligaments of cervical spine, initial encounter: Secondary | ICD-10-CM | POA: Diagnosis not present

## 2020-10-14 DIAGNOSIS — M9903 Segmental and somatic dysfunction of lumbar region: Secondary | ICD-10-CM | POA: Diagnosis not present

## 2020-10-21 DIAGNOSIS — M9902 Segmental and somatic dysfunction of thoracic region: Secondary | ICD-10-CM | POA: Diagnosis not present

## 2020-10-21 DIAGNOSIS — M9901 Segmental and somatic dysfunction of cervical region: Secondary | ICD-10-CM | POA: Diagnosis not present

## 2020-10-21 DIAGNOSIS — M9903 Segmental and somatic dysfunction of lumbar region: Secondary | ICD-10-CM | POA: Diagnosis not present

## 2020-10-21 DIAGNOSIS — S233XXA Sprain of ligaments of thoracic spine, initial encounter: Secondary | ICD-10-CM | POA: Diagnosis not present

## 2020-10-21 DIAGNOSIS — S338XXA Sprain of other parts of lumbar spine and pelvis, initial encounter: Secondary | ICD-10-CM | POA: Diagnosis not present

## 2020-10-21 DIAGNOSIS — S134XXA Sprain of ligaments of cervical spine, initial encounter: Secondary | ICD-10-CM | POA: Diagnosis not present

## 2020-11-01 DIAGNOSIS — S134XXA Sprain of ligaments of cervical spine, initial encounter: Secondary | ICD-10-CM | POA: Diagnosis not present

## 2020-11-01 DIAGNOSIS — S338XXA Sprain of other parts of lumbar spine and pelvis, initial encounter: Secondary | ICD-10-CM | POA: Diagnosis not present

## 2020-11-01 DIAGNOSIS — M9902 Segmental and somatic dysfunction of thoracic region: Secondary | ICD-10-CM | POA: Diagnosis not present

## 2020-11-01 DIAGNOSIS — M9903 Segmental and somatic dysfunction of lumbar region: Secondary | ICD-10-CM | POA: Diagnosis not present

## 2020-11-01 DIAGNOSIS — S233XXA Sprain of ligaments of thoracic spine, initial encounter: Secondary | ICD-10-CM | POA: Diagnosis not present

## 2020-11-01 DIAGNOSIS — M9901 Segmental and somatic dysfunction of cervical region: Secondary | ICD-10-CM | POA: Diagnosis not present

## 2020-11-03 ENCOUNTER — Other Ambulatory Visit: Payer: Self-pay | Admitting: Family Medicine

## 2020-11-28 ENCOUNTER — Other Ambulatory Visit: Payer: Self-pay | Admitting: Family Medicine

## 2020-11-29 NOTE — Telephone Encounter (Signed)
Last office visit 10/07/20 Last refill 10/07/20, #30, 1 refill

## 2020-12-11 DIAGNOSIS — D369 Benign neoplasm, unspecified site: Secondary | ICD-10-CM | POA: Diagnosis not present

## 2020-12-11 DIAGNOSIS — B379 Candidiasis, unspecified: Secondary | ICD-10-CM | POA: Diagnosis not present

## 2020-12-11 DIAGNOSIS — C20 Malignant neoplasm of rectum: Secondary | ICD-10-CM | POA: Diagnosis not present

## 2020-12-11 DIAGNOSIS — Z9049 Acquired absence of other specified parts of digestive tract: Secondary | ICD-10-CM | POA: Diagnosis not present

## 2021-01-27 ENCOUNTER — Other Ambulatory Visit: Payer: Self-pay

## 2021-01-27 ENCOUNTER — Other Ambulatory Visit: Payer: Self-pay | Admitting: Family Medicine

## 2021-01-27 ENCOUNTER — Ambulatory Visit (INDEPENDENT_AMBULATORY_CARE_PROVIDER_SITE_OTHER): Payer: Medicare Other | Admitting: Family

## 2021-01-27 ENCOUNTER — Encounter: Payer: Self-pay | Admitting: Family

## 2021-01-27 VITALS — BP 157/84 | HR 76 | Temp 97.7°F | Ht 61.0 in | Wt 230.8 lb

## 2021-01-27 DIAGNOSIS — G8929 Other chronic pain: Secondary | ICD-10-CM

## 2021-01-27 DIAGNOSIS — Z85038 Personal history of other malignant neoplasm of large intestine: Secondary | ICD-10-CM | POA: Diagnosis not present

## 2021-01-27 DIAGNOSIS — Z9049 Acquired absence of other specified parts of digestive tract: Secondary | ICD-10-CM | POA: Diagnosis not present

## 2021-01-27 DIAGNOSIS — R159 Full incontinence of feces: Secondary | ICD-10-CM | POA: Diagnosis not present

## 2021-01-27 DIAGNOSIS — M545 Low back pain, unspecified: Secondary | ICD-10-CM

## 2021-01-27 MED ORDER — METHYLPREDNISOLONE ACETATE 80 MG/ML IJ SUSP
80.0000 mg | Freq: Once | INTRAMUSCULAR | Status: AC
  Administered 2021-01-27: 80 mg via INTRAMUSCULAR

## 2021-01-27 NOTE — Progress Notes (Addendum)
Subjective:    Patient ID: Destiny [name], female    DOB: 03-Nov-1965, 55 y.o.   MRN: [id number]  Chief Complaint  Patient presents with   urine trouble     Ppw for Destiny [name]    Back Pain    Wants steroid shot    PT presents to the office today requesting medical supplies of incontinence briefs, pads, and gloves. She states she has a hx of colon cancer and had hx of lower anterior resection of rectum. She has reports 15 loose stools a day. She is followed by GI.   She is followed by Oncologists every 6 months hx tubular adenoma.  Back Pain This is a chronic problem. The current episode started more than 1 year ago. The problem occurs intermittently. The pain is present in the lumbar spine. The quality of the pain is described as aching. The pain is at a severity of 8/10. The symptoms are aggravated by twisting and standing. She has tried bed rest for the symptoms. The treatment provided mild relief.     Review of Systems  Musculoskeletal:  Positive for back pain.  All other systems reviewed and are negative.     Objective:   Physical Exam Vitals reviewed.  Constitutional:      General: She is not in acute distress.    Appearance: She is well-developed.  HENT:     Head: Normocephalic and atraumatic.  Eyes:     Pupils: Pupils are equal, round, and reactive to light.  Neck:     Thyroid: No thyromegaly.  Cardiovascular:     Rate and Rhythm: Normal rate and regular rhythm.     Heart sounds: Normal heart sounds. No murmur heard. Pulmonary:     Effort: Pulmonary effort is normal. No respiratory distress.     Breath sounds: Normal breath sounds. No wheezing.  Abdominal:     General: Bowel sounds are normal. There is no distension.     Palpations: Abdomen is soft.     Tenderness: There is no abdominal tenderness.  Musculoskeletal:        General: Tenderness present.     Cervical back: Normal range of motion and neck supple.     Comments: Pain in lumbar with flexion and extension   Skin:    General: Skin is warm and dry.  Neurological:     Mental Status: She is alert and oriented to person, place, and time.     Cranial Nerves: No cranial nerve deficit.     Deep Tendon Reflexes: Reflexes are normal and symmetric.  Psychiatric:        Behavior: Behavior normal.        Thought Content: Thought content normal.        Judgment: Judgment normal.      BP (!) 157/84   Pulse 76   Temp 97.7 F (36.5 C) (Temporal)   Ht '5\' 1"'$  (1.549 m)   Wt 230 lb 12.8 oz (104.7 kg)   LMP  (LMP Unknown) Comment: FULL   BMI 43.61 kg/m      Assessment & Plan:  Destiny [name] comes in today with chief complaint of urine trouble  (Ppw for [facility] ) and Back Pain (Wants steroid shot )   Diagnosis and orders addressed:  1. History of low anterior resection of rectum - For home use only DME Other see comment  2. Incontinence of feces, unspecified fecal incontinence type - For home use only DME Other see comment  3.  History of colon cancer - For home use only DME Other see comment  4. Chronic low back pain, unspecified back pain laterality, unspecified whether sciatica present - For home use only DME Other see comment - methylPREDNISolone acetate (DEPO-MEDROL) injection 80 mg   Health Maintenance reviewed Diet and exercise encouraged  Follow up plan: Keep follow up with PCP   Evelina Dun, FNP

## 2021-01-27 NOTE — Patient Instructions (Signed)
[email or web address]">  Chronic Back Pain When back pain lasts longer than 3 months, it is called chronic back pain. The cause of your back pain may not be known. Some common causes include: Wear and tear (degenerative disease) of the bones, ligaments, or disks in your back. Inflammation and stiffness in your back (arthritis). People who have chronic back pain often go through certain periods in which the pain is more intense (flare-ups). Many people can learn to manage the pain with home care. Follow these instructions at home: Pay attention to any changes in your symptoms. Take these actions to help withyour pain: Managing pain and stiffness     If directed, apply ice to the painful area. Your health care provider may recommend applying ice during the first 24-48 hours after a flare-up begins. To do this: Put ice in a plastic bag. Place a towel between your skin and the bag. Leave the ice on for 20 minutes, 2-3 times per day. If directed, apply heat to the affected area as often as told by your health care provider. Use the heat source that your health care provider recommends, such as a moist heat pack or a heating pad. Place a towel between your skin and the heat source. Leave the heat on for 20-30 minutes. Remove the heat if your skin turns bright red. This is especially important if you are unable to feel pain, heat, or cold. You may have a greater risk of getting burned. Try soaking in a warm tub. Activity  Avoid bending and other activities that make the problem worse. Maintain a proper position when standing or sitting: When standing, keep your upper back and neck straight, with your shoulders pulled back. Avoid slouching. When sitting, keep your back straight and relax your shoulders. Do not round your shoulders or pull them backward. Do not sit or stand in one place for long periods of time. Take brief periods of rest throughout the day. This will reduce your  pain. Resting in a lying or standing position is usually better than sitting to rest. When you are resting for longer periods, mix in some mild activity or stretching between periods of rest. This will help to prevent stiffness and pain. Get regular exercise. Ask your health care provider what activities are safe for you. Do not lift anything that is heavier than 10 lb (4.5 kg), or the limit that you are told, until your health care provider says that it is safe. Always use proper lifting technique, which includes: Bending your knees. Keeping the load close to your body. Avoiding twisting. Sleep on a firm mattress in a comfortable position. Try lying on your side with your knees slightly bent. If you lie on your back, put a pillow under your knees.  Medicines Treatment may include medicines for pain and inflammation taken by mouth or applied to the skin, prescription pain medicine, or muscle relaxants. Take over-the-counter and prescription medicines only as told by your health care provider. Ask your health care provider if the medicine prescribed to you: Requires you to avoid driving or using machinery. Can cause constipation. You may need to take these actions to prevent or treat constipation: Drink enough fluid to keep your urine pale yellow. Take over-the-counter or prescription medicines. Eat foods that are high in fiber, such as beans, whole grains, and fresh fruits and vegetables. Limit foods that are high in fat and processed sugars, such as fried or sweet foods. General instructions Do not use any products that contain   nicotine or tobacco, such as cigarettes, e-cigarettes, and chewing tobacco. If you need help quitting, ask your health care provider. Keep all follow-up visits as told by your health care provider. This is important. Contact a health care provider if: You have pain that is not relieved with rest or medicine. Your pain gets worse, or you have new pain. You have a high  fever. You have rapid weight loss. You have trouble doing your normal activities. Get help right away if: You have weakness or numbness in one or both of your legs or feet. You have trouble controlling your bladder or your bowels. You have severe back pain and have any of the following: Nausea or vomiting. Pain in your abdomen. Shortness of breath or you faint. Summary Chronic back pain is back pain that lasts longer than 3 months. When a flare-up begins, apply ice to the painful area for the first 24-48 hours. Apply a moist heat pad or use a heating pad on the painful area as directed by your health care provider. When you are resting for longer periods, mix in some mild activity or stretching between periods of rest. This will help to prevent stiffness and pain. This information is not intended to replace advice given to you by your health care provider. Make sure you discuss any questions you have with your healthcare provider. Document Revised: 06/28/2019 Document Reviewed: 06/28/2019 Elsevier Patient Education  2022 [employer]  

## 2021-02-09 DIAGNOSIS — Z20822 Contact with and (suspected) exposure to covid-19: Secondary | ICD-10-CM | POA: Diagnosis not present

## 2021-02-10 ENCOUNTER — Other Ambulatory Visit: Payer: Self-pay | Admitting: Family Medicine

## 2021-02-17 ENCOUNTER — Inpatient Hospital Stay: Payer: Medicare Other | Attending: Oncology | Admitting: Oncology

## 2021-02-17 ENCOUNTER — Other Ambulatory Visit: Payer: Self-pay | Admitting: *Deleted

## 2021-02-17 ENCOUNTER — Inpatient Hospital Stay: Payer: Medicare Other

## 2021-02-17 ENCOUNTER — Other Ambulatory Visit: Payer: Self-pay

## 2021-02-17 VITALS — BP 147/84 | HR 67 | Temp 98.1°F | Resp 20 | Ht 61.0 in | Wt 227.8 lb

## 2021-02-17 DIAGNOSIS — C20 Malignant neoplasm of rectum: Secondary | ICD-10-CM

## 2021-02-17 DIAGNOSIS — K91840 Postprocedural hemorrhage and hematoma of a digestive system organ or structure following a digestive system procedure: Secondary | ICD-10-CM | POA: Diagnosis not present

## 2021-02-17 LAB — CEA (ACCESS): CEA (CHCC): 1.26 ng/mL (ref 0.00–5.00)

## 2021-02-17 NOTE — Progress Notes (Signed)
Destiny [name]   Diagnosis: Rectal cancer  INTERVAL HISTORY:   Destiny [name] returns for a scheduled visit.  She continues to have rectal urgency, thin stool, and incontinence.  She reports mild rectal bleeding for the past several months.  She is followed by Dr. Hendricks Limes.  She reports a rectal nerve stimulator is being considered.  Objective:  Vital signs in last 24 hours:  Blood pressure (!) 147/84, pulse 67, temperature 98.1 F (36.7 C), temperature source Oral, resp. rate 20, height '5\' 1"'  (1.549 m), weight 227 lb 12.8 oz (103.3 kg), SpO2 98 %.    Lymphatics: No cervical, supraclavicular, axillary, or inguinal nodes.  Soft mobile fullness overlying the left clavicle without a discrete mass or lymph node Resp: Lungs clear bilaterally Cardio: Regular rate and rhythm GI: No mass, nontender, no hepatosplenomegaly Vascular: No leg edema  Lab Results:  Lab Results  Component Value Date   WBC 6.3 10/04/2020   HGB 11.9 10/04/2020   HCT 35.4 10/04/2020   MCV 88 10/04/2020   PLT 398 10/04/2020   NEUTROABS 4.0 10/04/2020    CMP  Lab Results  Component Value Date   NA 138 10/04/2020   K 4.3 10/04/2020   CL 100 10/04/2020   CO2 21 10/04/2020   GLUCOSE 148 (H) 10/04/2020   BUN 18 10/04/2020   CREATININE 0.92 10/04/2020   CALCIUM 9.6 10/04/2020   PROT 7.4 10/04/2020   ALBUMIN 4.3 10/04/2020   AST 16 10/04/2020   ALT 17 10/04/2020   ALKPHOS 78 10/04/2020   BILITOT <0.2 10/04/2020   GFRNONAA 69 03/22/2020   GFRAA 80 03/22/2020    Lab Results  Component Value Date   CEA1 1.62 06/19/2019   CEA <0.5 10/31/2015   No results found.  Medications: I have reviewed the patient's current medications.   Assessment/Plan:  Rectal cancer-clinical stage III (T3 N1) No loss of mismatch repair protein expression T3 N1 tumor beginning at 2 cm from the anal verge on an MRI of the pelvis 11/08/2015 CTs of the chest, abdomen, and pelvis 11/01/2015 with  no evidence of distant metastatic disease Initiation of neoadjuvant radiation/Xeloda 11/25/2015; completion of radiation/Xeloda 01/02/2016 Low anterior resection with a coloanal anastomosis 02/26/2016,ypT2,ypN0. Grade 3 adenocarcinoma, negative resection margins 02/26/2016 No BRAF, KRAS, NRAS mutation; Microsatellite status could not be determined CT abdomen/pelvis 03/30/2016-no evidence of recurrent rectal cancer Cycle 1 adjuvant Xeloda 04/14/2016 Cycle 2 adjuvant Xeloda 05/08/2016 Cycle 3 adjuvant Xeloda 05/29/2016 Cycle 4 adjuvant Xeloda 06/20/2016 (completed approximately 10 days) Cycle 5 adjuvant Xeloda started approximately 07/19/2016 CT 12/04/2016-no evidence of metastatic disease CT 12/08/2017- no evidence of metastatic disease CT 12/13/2018-no evidence of metastatic disease   2. History of hypertension   3. Left ureteral injury at the low anterior resection-status post a left ureteral neocystostomy and left double-J stent placement 02/26/2016   4. Hyperthyroidism status post radioactive iodine 05/12/2016   5. Ileostomy reversal 09/02/2016   6. Hospitalization 10/20/2016 through 10/22/2016 with UTI/sepsis.      Disposition: Destiny [name] is in remission from rectal cancer.  We will follow-up on the CEA from today.  She continues to have symptoms related to rectal surgery and radiation.  She will continue follow-up with gastroenterology for evaluation of the rectal bleeding and urgency.  We made a referral to pelvic physical therapy.  She reports she did not have insurance when we made this referral in the past.  She will return for an office visit in 1 year.  Betsy Coder,  MD  02/17/2021  11:19 AM

## 2021-02-18 ENCOUNTER — Telehealth: Payer: Self-pay | Admitting: *Deleted

## 2021-02-18 ENCOUNTER — Ambulatory Visit: Payer: Medicare Other

## 2021-02-18 LAB — CEA (IN HOUSE-CHCC): CEA (CHCC-In House): 1.15 ng/mL (ref 0.00–5.00)

## 2021-02-18 NOTE — Telephone Encounter (Signed)
-----   Message from Ladell Pier, MD sent at 02/18/2021  1:37 PM EDT ----- Please call patient, CEA is normal, follow-up as scheduled

## 2021-02-18 NOTE — Telephone Encounter (Signed)
Notified of normal CEA and to f/u as scheduled.  

## 2021-03-11 DIAGNOSIS — Z20822 Contact with and (suspected) exposure to covid-19: Secondary | ICD-10-CM | POA: Diagnosis not present

## 2021-03-12 ENCOUNTER — Other Ambulatory Visit: Payer: Self-pay | Admitting: Family Medicine

## 2021-03-12 DIAGNOSIS — F331 Major depressive disorder, recurrent, moderate: Secondary | ICD-10-CM

## 2021-04-18 ENCOUNTER — Other Ambulatory Visit: Payer: Self-pay | Admitting: Family Medicine

## 2021-04-18 DIAGNOSIS — F331 Major depressive disorder, recurrent, moderate: Secondary | ICD-10-CM

## 2021-04-21 DIAGNOSIS — B379 Candidiasis, unspecified: Secondary | ICD-10-CM | POA: Diagnosis not present

## 2021-04-21 DIAGNOSIS — Z9049 Acquired absence of other specified parts of digestive tract: Secondary | ICD-10-CM | POA: Diagnosis not present

## 2021-04-21 DIAGNOSIS — R195 Other fecal abnormalities: Secondary | ICD-10-CM | POA: Diagnosis not present

## 2021-04-21 DIAGNOSIS — C20 Malignant neoplasm of rectum: Secondary | ICD-10-CM | POA: Diagnosis not present

## 2021-04-21 NOTE — Telephone Encounter (Signed)
Dettinger NTBS 30 days given 03/12/21

## 2021-04-22 ENCOUNTER — Encounter: Payer: Self-pay | Admitting: Family Medicine

## 2021-04-22 NOTE — Telephone Encounter (Signed)
Pt made first available appt in January

## 2021-06-04 ENCOUNTER — Other Ambulatory Visit: Payer: Self-pay

## 2021-06-04 ENCOUNTER — Ambulatory Visit (INDEPENDENT_AMBULATORY_CARE_PROVIDER_SITE_OTHER): Payer: Commercial Managed Care - HMO

## 2021-06-04 VITALS — Ht 61.0 in | Wt 225.0 lb

## 2021-06-04 DIAGNOSIS — Z0000 Encounter for general adult medical examination without abnormal findings: Secondary | ICD-10-CM

## 2021-06-04 MED ORDER — TRAZODONE HCL 100 MG PO TABS
100.0000 mg | ORAL_TABLET | Freq: Every day | ORAL | 0 refills | Status: DC
Start: 2021-06-04 — End: 2021-06-09

## 2021-06-04 NOTE — Patient Instructions (Signed)
Destiny [name] , Thank you for taking time to come for your Medicare Wellness Visit. I appreciate your ongoing commitment to your health goals. Please review the following plan we discussed and let me know if I can assist you in the future.   Screening recommendations/referrals: Colonoscopy: Done 08/17/2019 - Repeat in 3 years Mammogram: Done 08/28/2020 - Repeat annually  Bone Density: Due at age 56 Recommended yearly ophthalmology/optometry visit for glaucoma screening and checkup Recommended yearly dental visit for hygiene and checkup  Vaccinations: Influenza vaccine: Done 01/2021 - Repeat annually  Pneumococcal vaccine: Due Tdap vaccine: Done 11/02/2013 - Repeat in 10 years Shingles vaccine: Done 01/24/2020 & 04/05/2020  Covid-19: Done 07/04/2019, 08/01/2019, & 04/23/2020  Advanced directives: Advance directive discussed with you today. Even though you declined this today, please call our office should you change your mind, and we can give you the proper paperwork for you to fill out.   Conditions/risks identified: Aim for 30 minutes of exercise or brisk walking each day, drink 6-8 glasses of water and eat lots of fruits and vegetables.   Next appointment: Follow up in one year for your annual wellness visit.   Preventive Care 40-64 Years, Female Preventive care refers to lifestyle choices and visits with your health care provider that can promote health and wellness. What does preventive care include? A yearly physical exam. This is also called an annual well check. Dental exams once or twice a year. Routine eye exams. Ask your health care provider how often you should have your eyes checked. Personal lifestyle choices, including: Daily care of your teeth and gums. Regular physical activity. Eating a healthy diet. Avoiding tobacco and drug use. Limiting alcohol use. Practicing safe sex. Taking low-dose aspirin daily starting at age 7. Taking vitamin and mineral supplements as recommended by  your health care provider. What happens during an annual well check? The services and screenings done by your health care provider during your annual well check will depend on your age, overall health, lifestyle risk factors, and family history of disease. Counseling  Your health care provider may ask you questions about your: Alcohol use. Tobacco use. Drug use. Emotional well-being. Home and relationship well-being. Sexual activity. Eating habits. Work and work Statistician. Method of birth control. Menstrual cycle. Pregnancy history. Screening  You may have the following tests or measurements: Height, weight, and BMI. Blood pressure. Lipid and cholesterol levels. These may be checked every 5 years, or more frequently if you are over 24 years old. Skin check. Lung cancer screening. You may have this screening every year starting at age 31 if you have a 30-pack-year history of smoking and currently smoke or have quit within the past 15 years. Fecal occult blood test (FOBT) of the stool. You may have this test every year starting at age 18. Flexible sigmoidoscopy or colonoscopy. You may have a sigmoidoscopy every 5 years or a colonoscopy every 10 years starting at age 1. Hepatitis C blood test. Hepatitis B blood test. Sexually transmitted disease (STD) testing. Diabetes screening. This is done by checking your blood sugar (glucose) after you have not eaten for a while (fasting). You may have this done every 1-3 years. Mammogram. This may be done every 1-2 years. Talk to your health care provider about when you should start having regular mammograms. This may depend on whether you have a family history of breast cancer. BRCA-related cancer screening. This may be done if you have a family history of breast, ovarian, tubal, or peritoneal cancers.  Pelvic exam and Pap test. This may be done every 3 years starting at age 1. Starting at age 56, this may be done every 5 years if you have a Pap  test in combination with an HPV test. Bone density scan. This is done to screen for osteoporosis. You may have this scan if you are at high risk for osteoporosis. Discuss your test results, treatment options, and if necessary, the need for more tests with your health care provider. Vaccines  Your health care provider may recommend certain vaccines, such as: Influenza vaccine. This is recommended every year. Tetanus, diphtheria, and acellular pertussis (Tdap, Td) vaccine. You may need a Td booster every 10 years. Zoster vaccine. You may need this after age 76. Pneumococcal 13-valent conjugate (PCV13) vaccine. You may need this if you have certain conditions and were not previously vaccinated. Pneumococcal polysaccharide (PPSV23) vaccine. You may need one or two doses if you smoke cigarettes or if you have certain conditions. Talk to your health care provider about which screenings and vaccines you need and how often you need them. This information is not intended to replace advice given to you by your health care provider. Make sure you discuss any questions you have with your health care provider. Document Released: 06/14/2015 Document Revised: 02/05/2016 Document Reviewed: 03/19/2015 Elsevier Interactive Patient Education  2017 [place] Prevention in the Home Falls can cause injuries. They can happen to people of all ages. There are many things you can do to make your home safe and to help prevent falls. What can I do on the outside of my home? Regularly fix the edges of walkways and driveways and fix any cracks. Remove anything that might make you trip as you walk through a door, such as a raised step or threshold. Trim any bushes or trees on the path to your home. Use bright outdoor lighting. Clear any walking paths of anything that might make someone trip, such as rocks or tools. Regularly check to see if handrails are loose or broken. Make sure that both sides of any steps  have handrails. Any raised decks and porches should have guardrails on the edges. Have any leaves, snow, or ice cleared regularly. Use sand or salt on walking paths during winter. Clean up any spills in your garage right away. This includes oil or grease spills. What can I do in the bathroom? Use night lights. Install grab bars by the toilet and in the tub and shower. Do not use towel bars as grab bars. Use non-skid mats or decals in the tub or shower. If you need to sit down in the shower, use a plastic, non-slip stool. Keep the floor dry. Clean up any water that spills on the floor as soon as it happens. Remove soap buildup in the tub or shower regularly. Attach bath mats securely with double-sided non-slip rug tape. Do not have throw rugs and other things on the floor that can make you trip. What can I do in the bedroom? Use night lights. Make sure that you have a light by your bed that is easy to reach. Do not use any sheets or blankets that are too big for your bed. They should not hang down onto the floor. Have a firm chair that has side arms. You can use this for support while you get dressed. Do not have throw rugs and other things on the floor that can make you trip. What can I do in the kitchen?  Clean up any spills right away. Avoid walking on wet floors. Keep items that you use a lot in easy-to-reach places. If you need to reach something above you, use a strong step stool that has a grab bar. Keep electrical cords out of the way. Do not use floor polish or wax that makes floors slippery. If you must use wax, use non-skid floor wax. Do not have throw rugs and other things on the floor that can make you trip. What can I do with my stairs? Do not leave any items on the stairs. Make sure that there are handrails on both sides of the stairs and use them. Fix handrails that are broken or loose. Make sure that handrails are as long as the stairways. Check any carpeting to make  sure that it is firmly attached to the stairs. Fix any carpet that is loose or worn. Avoid having throw rugs at the top or bottom of the stairs. If you do have throw rugs, attach them to the floor with carpet tape. Make sure that you have a light switch at the top of the stairs and the bottom of the stairs. If you do not have them, ask someone to add them for you. What else can I do to help prevent falls? Wear shoes that: Do not have high heels. Have rubber bottoms. Are comfortable and fit you well. Are closed at the toe. Do not wear sandals. If you use a stepladder: Make sure that it is fully opened. Do not climb a closed stepladder. Make sure that both sides of the stepladder are locked into place. Ask someone to hold it for you, if possible. Clearly mark and make sure that you can see: Any grab bars or handrails. First and last steps. Where the edge of each step is. Use tools that help you move around (mobility aids) if they are needed. These include: Canes. Walkers. Scooters. Crutches. Turn on the lights when you go into a dark area. Replace any light bulbs as soon as they burn out. Set up your furniture so you have a clear path. Avoid moving your furniture around. If any of your floors are uneven, fix them. If there are any pets around you, be aware of where they are. Review your medicines with your doctor. Some medicines can make you feel dizzy. This can increase your chance of falling. Ask your doctor what other things that you can do to help prevent falls. This information is not intended to replace advice given to you by your health care provider. Make sure you discuss any questions you have with your health care provider. Document Released: 03/14/2009 Document Revised: 10/24/2015 Document Reviewed: 06/22/2014 Elsevier Interactive Patient Education  2017 [employer].

## 2021-06-04 NOTE — Telephone Encounter (Signed)
During her AWV today, she requested Trazodone rf as she is out and appt isn't for 5 more days.  Also, she wants to know if she can get a prednisone injection for her back pain and discuss weight loss med at her appt Monday. Thanks

## 2021-06-04 NOTE — Progress Notes (Signed)
Subjective:   Destiny [name] is a 56 y.o. female who presents for Medicare Annual (Subsequent) preventive examination.  Virtual Visit via Telephone Note  I connected with  Destiny [name] on 06/04/21 at  9:00 AM EST by telephone and verified that I am speaking with the correct person using two identifiers.  Location: Patient: Home Provider: [facility] Persons participating in the virtual visit: patient/Nurse Health Advisor   I discussed the limitations, risks, security and privacy concerns of performing an evaluation and management service by telephone and the availability of in person appointments. The patient expressed understanding and agreed to proceed.  Interactive audio and video telecommunications were attempted between this nurse and patient, however failed, due to patient having technical difficulties OR patient did not have access to video capability.  We continued and completed visit with audio only.  Some vital signs may be absent or patient reported.   Taylynn Easton E Elanah Osmanovic, LPN   Review of Systems     Cardiac Risk Factors include: obesity (BMI >30kg/m2);hypertension;sedentary lifestyle     Objective:    Today's Vitals   06/04/21 0908  Weight: 225 lb (102.1 kg)  Height: '5\' 1"'  (1.549 m)   Body mass index is 42.51 kg/m.  Advanced Directives 06/04/2021 06/03/2020 06/19/2019 05/13/2017 04/12/2017 10/21/2016 10/20/2016  Does Patient Have a Medical Advance Directive? No No No No No No No  Would patient like information on creating a medical advance directive? No - Patient declined Yes (MAU/Ambulatory/Procedural Areas - Information given) No - Patient declined No - Patient declined - No - Patient declined No - Patient declined    Current Medications (verified) Outpatient Encounter Medications as of 06/04/2021  Medication Sig   acetaminophen (TYLENOL) 500 MG tablet Take 1,000 mg by mouth every 8 (eight) hours as needed for moderate pain.   azelastine (OPTIVAR) 0.05 % ophthalmic solution Apply  1 drop to eye 2 (two) times daily.   Cholecalciferol (VITAMIN D3 PO) Take by mouth.   colestipol (COLESTID) 1 g tablet Take by mouth.   cyclobenzaprine (FLEXERIL) 10 MG tablet TAKE 1 TABLET BY MOUTH AT BEDTIME AS NEEDED FOR MUSCLE SPASMS.   diclofenac (VOLTAREN) 75 MG EC tablet Take 1 tab twice daily for muscle and joint pain   diphenoxylate-atropine (LOMOTIL) 2.5-0.025 MG tablet Take 2 tablets by mouth 4 (four) times daily as needed.   fluconazole (DIFLUCAN) 200 MG tablet Take 200 mg by mouth daily.   fluticasone (FLONASE) 50 MCG/ACT nasal spray Place 1 spray into both nostrils 2 (two) times daily.   gabapentin (NEURONTIN) 300 MG capsule Take 1 capsule (300 mg total) by mouth 3 (three) times daily. (NEEDS TO BE SEEN BEFORE NEXT REFILL)   hydrochlorothiazide (HYDRODIURIL) 25 MG tablet Take 1 tablet (25 mg total) by mouth daily.   levothyroxine (SYNTHROID) 150 MCG tablet Take 1 tablet (150 mcg total) by mouth daily before breakfast.   LORazepam (ATIVAN) 0.5 MG tablet TAKE 1 TABLET (0.5 MG) BY MOUTH AT BEDTIME AS NEEDED FOR ANXIETY   Multiple Vitamins-Minerals (MULTIVITAMIN WITH MINERALS) tablet Take 1 tablet by mouth daily.   naproxen sodium (ALEVE) 220 MG tablet Take by mouth.   ondansetron (ZOFRAN) 8 MG tablet Take 1 tablet by mouth every 8 hours as needed for nausea or vomiting.   sertraline (ZOLOFT) 100 MG tablet Take 1 tablet (100 mg total) by mouth daily.   Turmeric (QC TUMERIC COMPLEX PO) Take 1 capsule by mouth daily.   vitamin B-12 (CYANOCOBALAMIN) 100 MCG tablet Take 100 mcg  by mouth daily.   vitamin C (ASCORBIC ACID) 500 MG tablet Take 500 mg by mouth daily.   zinc gluconate 50 MG tablet Take 50 mg by mouth daily.   [DISCONTINUED] traZODone (DESYREL) 100 MG tablet Take 100 mg by mouth at bedtime.   amLODipine (NORVASC) 5 MG tablet Take 1 tablet (5 mg total) by mouth daily. (Patient not taking: Reported on 06/04/2021)   buPROPion (WELLBUTRIN XL) 150 MG 24 hr tablet Take 1 tablet (150 mg  total) by mouth daily. (Patient not taking: Reported on 06/04/2021)   dicyclomine (BENTYL) 20 MG tablet Take by mouth. (Patient not taking: Reported on 06/04/2021)   methocarbamol (ROBAXIN) 500 MG tablet Take by mouth. (Patient not taking: Reported on 06/04/2021)   No facility-administered encounter medications on file as of 06/04/2021.    Allergies (verified) Latex   History: Past Medical History:  Diagnosis Date   Chronic back pain    Depression    Hypertension    Hyperthyroidism    Rectal cancer ([place]) 10/29/2015   rectal invasice adenocarcinoma,Adenocarcinoma dx 10/29/15; Stage III, T3, N1, MO   Past Surgical History:  Procedure Laterality Date   ABDOMINAL HYSTERECTOMY     vaginal    BLADDER SURGERY     EYE SURGERY     LAZY EYE CORRECTION   ILEOSTOMY     ILEOSTOMY CLOSURE N/A 09/02/2016   Procedure: ILEOSTOMY REVERSAL;  Surgeon: Leighton Ruff, MD;  Location: [facility];  Service: General;  Laterality: N/A;   RECTAL EXAM UNDER ANESTHESIA N/A 09/02/2016   Procedure: ANAL EXAM UNDER ANESTHESIA;  Surgeon: Leighton Ruff, MD;  Location: [facility];  Service: General;  Laterality: N/A;   URETERAL REIMPLANTION Left 02/26/2016   Procedure: left ureteral neocystotomy,double J stent placement, closure vaginal cuff;  Surgeon: Ardis Hughs, MD;  Location: [facility];  Service: Urology;  Laterality: Left;   VAGINAL HYSTERECTOMY     XI ROBOTIC ASSISTED LOWER ANTERIOR RESECTION N/A 02/26/2016   Procedure: XI ROBOTIC ASSISTED LOWER ANTERIOR RESECTION, diverting LOOP ILEOSTOMY, mobilization splenic flexure, omentopexy;  Surgeon: Leighton Ruff, MD;  Location: [facility];  Service: General;  Laterality: N/A;   Family History  Problem Relation Age of Onset   Thyroid disease Mother    Arthritis Mother    Lung cancer Father        lung   Cirrhosis Brother    Migraines Sister    Depression Sister    Migraines Daughter    Social History   Socioeconomic History   Marital status: Divorced    Spouse name: Not on  file   Number of children: 3   Years of education: Not on file   Highest education level: Some college, no degree  Occupational History   Occupation: Disabled  Tobacco Use   Smoking status: Never   Smokeless tobacco: Never  Vaping Use   Vaping Use: Never used  Substance and Sexual Activity   Alcohol use: No    Alcohol/week: 0.0 standard drinks    Comment: occ   Drug use: No   Sexual activity: Yes    Birth control/protection: Surgical  Other Topics Concern   Not on file  Social History Narrative   She is on disability.   Grandson lives with her.   Social Determinants of Health   Financial Resource Strain: Low Risk    Difficulty of Paying Living Expenses: Not hard at all  Food Insecurity: No Food Insecurity   Worried About Charity fundraiser in the Last Year:  Never true   Ran Out of Food in the Last Year: Never true  Transportation Needs: No Transportation Needs   Lack of Transportation (Medical): No   Lack of Transportation (Non-Medical): No  Physical Activity: Inactive   Days of Exercise per Week: 0 days   Minutes of Exercise per Session: 0 min  Stress: No Stress Concern Present   Feeling of Stress : Only a little  Social Connections: Socially Isolated   Frequency of Communication with Friends and Family: More than three times a week   Frequency of Social Gatherings with Friends and Family: Three times a week   Attends Religious Services: Never   Active Member of Clubs or Organizations: No   Attends Music therapist: Never   Marital Status: Divorced    Tobacco Counseling Counseling given: Not Answered   Clinical Intake:  Pre-visit preparation completed: Yes  Pain : No/denies pain     BMI - recorded: 45.51 Nutritional Status: BMI > 30  Obese Nutritional Risks: Nausea/ vomitting/ diarrhea (Urgent Care over the weekend with gastritis - improving now) Diabetes: No  How often do you need to have someone help you when you read instructions,  pamphlets, or other written materials from your doctor or pharmacy?: 1 - Never  Diabetic? no  Interpreter Needed?: No  Information entered by :: Nahla Lukin, LPN   Activities of Daily Living In your present state of health, do you have any difficulty performing the following activities: 06/04/2021  Hearing? N  Vision? N  Difficulty concentrating or making decisions? Y  Walking or climbing stairs? N  Dressing or bathing? N  Doing errands, shopping? N  Preparing Food and eating ? N  Using the Toilet? N  In the past six months, have you accidently leaked urine? Y  Do you have problems with loss of bowel control? Y  Managing your Medications? N  Managing your Finances? N  Housekeeping or managing your Housekeeping? N  Some recent data might be hidden    Patient Care Team: Dettinger, Fransisca Kaufmann, MD as PCP - General (Family Medicine) Patwa, Elta Guadeloupe, MD as Referring Physician (Gastroenterology) Ladell Pier, MD as Consulting Physician (Oncology)  Indicate any recent Medical Services you may have received from other than [facility] providers in the past year (date may be approximate).     Assessment:   This is a routine wellness examination for [facility].  Hearing/Vision screen Hearing Screening - Comments:: Denies hearing difficulties  Vision Screening - Comments:: Wears rx glasses mostly prn reading - up to date with annual eye exams with [facility] in [place]  Dietary issues and exercise activities discussed: Current Exercise Habits: The patient does not participate in regular exercise at present, Exercise limited by: None identified   Goals Addressed             This Visit's Progress    AWV   Not on track    06/03/2020 AWV Goal: Exercise for General Health  Patient will verbalize understanding of the benefits of increased physical activity: Exercising regularly is important. It will improve your overall fitness, flexibility, and endurance. Regular exercise also will improve  your overall health. It can help you control your weight, reduce stress, and improve your bone density. Over the next year, patient will increase physical activity as tolerated with a goal of at least 150 minutes of moderate physical activity per week.  You can tell that you are exercising at a moderate intensity if your heart starts beating faster  and you start breathing faster but can still hold a conversation. Moderate-intensity exercise ideas include: Walking 1 mile (1.6 km) in about 15 minutes Biking Hiking Golfing Dancing Water aerobics Patient will verbalize understanding of everyday activities that increase physical activity by providing examples like the following: Yard work, such as: Sales promotion account executive Gardening Washing windows or floors Patient will be able to explain general safety guidelines for exercising:  Before you start a new exercise program, talk with your health care provider. Do not exercise so much that you hurt yourself, feel dizzy, or get very short of breath. Wear comfortable clothes and wear shoes with good support. Drink plenty of water while you exercise to prevent dehydration or heat stroke. Work out until your breathing and your heartbeat get faster.        Depression Screen PHQ 2/9 Scores 06/04/2021 07/15/2020 06/03/2020 03/22/2020 10/25/2019 09/05/2019 02/01/2019  PHQ - 2 Score '2 2 2 6 6 ' 0 0  PHQ- 9 Score 6 - '7 15 10 ' - -    Fall Risk Fall Risk  06/04/2021 10/04/2020 07/15/2020 06/03/2020 03/22/2020  Falls in the past year? 0 0 0 0 0  Number falls in past yr: 0 - - - -  Injury with Fall? 0 - - - -  Risk for fall due to : No Fall Risks - - - -  Follow up Falls prevention discussed - - - -    FALL [place]:  Any stairs in or around the home? Yes  If so, are there any without handrails? No  Home free of loose throw rugs in walkways, pet beds,  electrical cords, etc? Yes  Adequate lighting in your home to reduce risk of falls? Yes   ASSISTIVE DEVICES UTILIZED TO PREVENT FALLS:  Life alert? No  Use of a cane, walker or w/c? No  Grab bars in the bathroom? No  Shower chair or bench in shower? No  Elevated toilet seat or a handicapped toilet? Yes   TIMED UP AND GO:  Was the test performed? No . Telephonic visit  Cognitive Function: Normal cognitive status assessed by direct observation by this Nurse Health Advisor. No abnormalities found.       6CIT Screen 06/03/2020  What Year? 0 points  What month? 0 points  What time? 0 points  Count back from 20 0 points  Months in reverse 0 points  Repeat phrase 0 points  Total Score 0    Immunizations Immunization History  Administered Date(s) Administered   Hepatitis A 08/08/2007, 10/28/2007, 06/06/2009   Hepatitis A, Adult 08/08/2007, 10/28/2007, 06/06/2009   Hepatitis B 08/08/2007, 10/28/2007, 06/06/2009   Influenza,inj,Quad PF,6+ Mos 02/19/2016, 05/07/2017, 03/18/2018, 04/07/2019, 03/22/2020   Influenza-Unspecified 03/18/2018   MMR 01/14/2001   Moderna Sars-Covid-2 Vaccination 07/04/2019, 08/01/2019, 04/23/2020   Td 01/14/2001, 11/02/2013   Tdap 11/02/2013   Zoster Recombinat (Shingrix) 01/24/2020, 04/05/2020    TDAP status: Up to date  Flu Vaccine status: Up to date  Pneumococcal vaccine status: Due, Education has been provided regarding the importance of this vaccine. Advised may receive this vaccine at local pharmacy or Health Dept. Aware to provide a copy of the vaccination record if obtained from local pharmacy or Health Dept. Verbalized acceptance and understanding.  Covid-19 vaccine status: Completed vaccines  Qualifies for Shingles Vaccine? Yes   Zostavax completed Yes   Shingrix Completed?: Yes  Screening Tests Health Maintenance  Topic Date Due   Pneumococcal Vaccine 35-56 Years old (1 - PCV) Never done   COVID-19 Vaccine (4 - Booster for Moderna  series) 06/18/2020   INFLUENZA VACCINE  08/29/2021 (Originally 12/30/2020)   MAMMOGRAM  08/28/2021   PAP SMEAR-Modifier  01/31/2022   COLONOSCOPY (Pts 45-29yr Insurance coverage will need to be confirmed)  08/17/2022   TETANUS/TDAP  11/03/2023   Hepatitis C Screening  Completed   HIV Screening  Completed   Zoster Vaccines- Shingrix  Completed   HPV VACCINES  Aged Out    Health Maintenance  Health Maintenance Due  Topic Date Due   Pneumococcal Vaccine 135663Years old (1 - PCV) Never done   COVID-19 Vaccine (4 - Booster for Moderna series) 06/18/2020    Colorectal cancer screening: Type of screening: Colonoscopy. Completed 08/17/2019. Repeat every 3 years  Mammogram status: Completed 08/28/2020. Repeat every year  Lung Cancer Screening: (Low Dose CT Chest recommended if Age 56-80years, 30 pack-year currently smoking OR have quit w/in 15years.) does not qualify.   Additional Screening:  Hepatitis C Screening: does qualify; Completed 03/22/2020  Vision Screening: Recommended annual ophthalmology exams for early detection of glaucoma and other disorders of the eye. Is the patient up to date with their annual eye exam?  Yes  Who is the provider or what is the name of the office in which the patient attends annual [place] exams[place] not [place] with a provider, would they like to be referred to a provider to establish care? No .   Dental Screening: Recommended annual dental exams for proper oral hygiene  Community Resource Referral / Chronic Care Management: CRR required this visit?  No   CCM required this visit?  No      Plan:     I have personally reviewed and noted the following in the patients chart:   Medical and social history Use of alcohol, tobacco or illicit drugs  Current medications and supplements including opioid prescriptions.  Functional ability and status Nutritional status Physical activity Advanced directives List of  other physicians Hospitalizations, surgeries, and ER visits in previous 12 months Vitals Screenings to include cognitive, depression, and falls Referrals and appointments  In addition, I have reviewed and discussed with patient certain preventive protocols, quality metrics, and best practice recommendations. A written personalized care plan for preventive services as well as general preventive health recommendations were provided to patient.     Kashay Cavenaugh E Abas Leicht, LPN   18/06/1912  Due to this being a telephonic visit, the after visit summary with patients personalized plan was offered to patient via mail or my-chart. Patient declined at this time.  Nurse Notes: would like a steroid shot at her visit Monday as this helps her back pain; also interested in a medication for weight loss.

## 2021-06-09 ENCOUNTER — Ambulatory Visit (INDEPENDENT_AMBULATORY_CARE_PROVIDER_SITE_OTHER): Payer: Commercial Managed Care - HMO | Admitting: Family Medicine

## 2021-06-09 ENCOUNTER — Encounter: Payer: Self-pay | Admitting: Family Medicine

## 2021-06-09 VITALS — BP 154/83 | HR 83 | Ht 61.0 in | Wt 233.0 lb

## 2021-06-09 DIAGNOSIS — I10 Essential (primary) hypertension: Secondary | ICD-10-CM

## 2021-06-09 DIAGNOSIS — D649 Anemia, unspecified: Secondary | ICD-10-CM

## 2021-06-09 DIAGNOSIS — M545 Low back pain, unspecified: Secondary | ICD-10-CM | POA: Diagnosis not present

## 2021-06-09 DIAGNOSIS — F331 Major depressive disorder, recurrent, moderate: Secondary | ICD-10-CM | POA: Diagnosis not present

## 2021-06-09 DIAGNOSIS — R739 Hyperglycemia, unspecified: Secondary | ICD-10-CM | POA: Diagnosis not present

## 2021-06-09 DIAGNOSIS — Z131 Encounter for screening for diabetes mellitus: Secondary | ICD-10-CM

## 2021-06-09 DIAGNOSIS — Z79891 Long term (current) use of opiate analgesic: Secondary | ICD-10-CM | POA: Diagnosis not present

## 2021-06-09 DIAGNOSIS — G8929 Other chronic pain: Secondary | ICD-10-CM

## 2021-06-09 DIAGNOSIS — E890 Postprocedural hypothyroidism: Secondary | ICD-10-CM

## 2021-06-09 LAB — BAYER DCA HB A1C WAIVED: HB A1C (BAYER DCA - WAIVED): 5.5 % (ref 4.8–5.6)

## 2021-06-09 MED ORDER — TRAZODONE HCL 100 MG PO TABS: 100.0000 mg | ORAL_TABLET | Freq: Every day | ORAL | 3 refills | Status: DC

## 2021-06-09 MED ORDER — METHYLPREDNISOLONE ACETATE 40 MG/ML IJ SUSP
80.0000 mg | Freq: Once | INTRAMUSCULAR | Status: AC
  Administered 2021-06-09: 80 mg via INTRAMUSCULAR

## 2021-06-09 MED ORDER — CYCLOBENZAPRINE HCL 10 MG PO TABS: ORAL_TABLET | ORAL | 3 refills | Status: DC

## 2021-06-09 MED ORDER — GABAPENTIN 300 MG PO CAPS: 300.0000 mg | ORAL_CAPSULE | Freq: Three times a day (TID) | ORAL | 3 refills | Status: DC

## 2021-06-09 NOTE — Progress Notes (Signed)
BP (!) 154/83    Pulse 83    Ht '5\' 1"'  (1.549 m)    Wt 233 lb (105.7 kg)    LMP  (LMP Unknown) Comment: FULL    SpO2 94%    BMI 44.02 kg/m    Subjective:   Patient ID: Destiny [name], female    DOB: 03-Nov-1965, 56 y.o.   MRN: [id number]  HPI: Destiny [name] is a 56 y.o. female presenting on 06/09/2021 for Medical Management of Chronic Issues   HPI Hypertension Patient is currently on hydrochlorothiazide, and their blood pressure today is 154/80. Patient denies any lightheadedness or dizziness. Patient denies headaches, blurred vision, chest pains, shortness of breath, or weakness. Denies any side effects from medication and is content with current medication.   Hypothyroidism recheck Patient is coming in for thyroid recheck today as well. They deny any issues with hair changes or heat or cold problems or diarrhea or constipation. They deny any chest pain or palpitations. They are currently on levothyroxine 150 micrograms   Anemia recheck Patient is coming in today for anemia recheck, will do blood work for her today.   Depression and anxiety Patient was not able to tolerate the Wellbutrin, she says it gave her diarrhea.  She is still taking the Zoloft.  She still uses the occasional lorazepam.  She says that does pretty well for her.  She takes gabapentin for neuropathy which also helps with her mood some as well.  She says she gets a lot of back pain and degenerative disc issues as well and would like an injection for that.  Relevant past medical, surgical, family and social history reviewed and updated as indicated. Interim medical history since our last visit reviewed. Allergies and medications reviewed and updated.  Review of Systems  Constitutional:  Negative for chills and fever.  Eyes:  Negative for visual disturbance.  Respiratory:  Negative for chest tightness and shortness of breath.   Cardiovascular:  Negative for chest pain and leg swelling.  Skin:  Negative for rash.   Neurological:  Negative for light-headedness and headaches.  Psychiatric/Behavioral:  Negative for agitation and behavioral problems.   All other systems reviewed and are negative.  Per HPI unless specifically indicated above   Allergies as of 06/09/2021       Reactions   Latex Itching, Rash        Medication List        Accurate as of June 09, 2021  2:44 PM. If you have any questions, ask your nurse or doctor.          STOP taking these medications    amLODipine 5 MG tablet Commonly known as: NORVASC Stopped by: Fransisca Kaufmann Raffael Bugarin, MD   buPROPion 150 MG 24 hr tablet Commonly known as: Wellbutrin XL Stopped by: Fransisca Kaufmann Dani Danis, MD       TAKE these medications    acetaminophen 500 MG tablet Commonly known as: TYLENOL Take 1,000 mg by mouth every 8 (eight) hours as needed for moderate pain.   azelastine 0.05 % ophthalmic solution Commonly known as: OPTIVAR Apply 1 drop to eye 2 (two) times daily.   colestipol 1 g tablet Commonly known as: COLESTID Take by mouth.   cyclobenzaprine 10 MG tablet Commonly known as: FLEXERIL TAKE 1 TABLET BY MOUTH AT BEDTIME AS NEEDED FOR MUSCLE SPASMS.   diclofenac 75 MG EC tablet Commonly known as: VOLTAREN Take 1 tab twice daily for muscle and joint pain   dicyclomine  20 MG tablet Commonly known as: BENTYL Take by mouth.   diphenoxylate-atropine 2.5-0.025 MG tablet Commonly known as: LOMOTIL Take 2 tablets by mouth 4 (four) times daily as needed.   fluconazole 200 MG tablet Commonly known as: DIFLUCAN Take 200 mg by mouth daily.   fluticasone 50 MCG/ACT nasal spray Commonly known as: FLONASE Place 1 spray into both nostrils 2 (two) times daily.   gabapentin 300 MG capsule Commonly known as: NEURONTIN Take 1 capsule (300 mg total) by mouth 3 (three) times daily. What changed: additional instructions Changed by: Fransisca Kaufmann Hollin Crewe, MD   hydrochlorothiazide 25 MG tablet Commonly known as:  HYDRODIURIL Take 1 tablet (25 mg total) by mouth daily.   levothyroxine 150 MCG tablet Commonly known as: SYNTHROID Take 1 tablet (150 mcg total) by mouth daily before breakfast.   LORazepam 0.5 MG tablet Commonly known as: ATIVAN TAKE 1 TABLET (0.5 MG) BY MOUTH AT BEDTIME AS NEEDED FOR ANXIETY   methocarbamol 500 MG tablet Commonly known as: ROBAXIN Take by mouth.   multivitamin with minerals tablet Take 1 tablet by mouth daily.   naproxen sodium 220 MG tablet Commonly known as: ALEVE Take by mouth.   ondansetron 8 MG tablet Commonly known as: ZOFRAN Take 1 tablet by mouth every 8 hours as needed for nausea or vomiting.   QC TUMERIC COMPLEX PO Take 1 capsule by mouth daily.   sertraline 100 MG tablet Commonly known as: ZOLOFT Take 1 tablet (100 mg total) by mouth daily.   traZODone 100 MG tablet Commonly known as: DESYREL Take 1 tablet (100 mg total) by mouth at bedtime.   vitamin B-12 100 MCG tablet Commonly known as: CYANOCOBALAMIN Take 100 mcg by mouth daily.   vitamin C 500 MG tablet Commonly known as: ASCORBIC ACID Take 500 mg by mouth daily.   VITAMIN D3 PO Take by mouth.   zinc gluconate 50 MG tablet Take 50 mg by mouth daily.         Objective:   BP (!) 154/83    Pulse 83    Ht '5\' 1"'  (1.549 m)    Wt 233 lb (105.7 kg)    LMP  (LMP Unknown) Comment: FULL    SpO2 94%    BMI 44.02 kg/m   Wt Readings from Last 3 Encounters:  06/09/21 233 lb (105.7 kg)  06/04/21 225 lb (102.1 kg)  02/17/21 227 lb 12.8 oz (103.3 kg)    Physical Exam Vitals and nursing note reviewed.  Constitutional:      General: She is not in acute distress.    Appearance: She is well-developed. She is not diaphoretic.  Eyes:     Conjunctiva/sclera: Conjunctivae normal.  Cardiovascular:     Rate and Rhythm: Normal rate and regular rhythm.     Heart sounds: Normal heart sounds. No murmur heard. Pulmonary:     Effort: Pulmonary effort is normal. No respiratory distress.      Breath sounds: Normal breath sounds. No wheezing.  Musculoskeletal:        General: No swelling or tenderness. Normal range of motion.  Skin:    General: Skin is warm and dry.     Findings: No rash.  Neurological:     Mental Status: She is alert and oriented to person, place, and time.     Coordination: Coordination normal.  Psychiatric:        Behavior: Behavior normal.      Assessment & Plan:   Problem List Items Addressed This  Visit       Cardiovascular and Mediastinum   Essential hypertension, benign   Relevant Orders   CMP14+EGFR   Lipid panel     Endocrine   Hypothyroidism - Primary   Relevant Orders   CMP14+EGFR   TSH     Other   Depression   Relevant Medications   traZODone (DESYREL) 100 MG tablet   gabapentin (NEURONTIN) 300 MG capsule   Other Relevant Orders   ToxASSURE Select 13 (MW), Urine   Anemia   Relevant Orders   CBC with Differential/Platelet   Other Visit Diagnoses     Diabetes mellitus screening       Relevant Orders   Bayer DCA Hb A1c Waived   Chronic low back pain, unspecified back pain laterality, unspecified whether sciatica present       Relevant Medications   cyclobenzaprine (FLEXERIL) 10 MG tablet   traZODone (DESYREL) 100 MG tablet   gabapentin (NEURONTIN) 300 MG capsule   methylPREDNISolone acetate (DEPO-MEDROL) injection 80 mg (Start on 06/09/2021  2:45 PM)       Blood pressure elevated, no change in medication today except that she had held her amlodipine and I told her to restart it and then monitor blood pressure closely come back in a couple weeks.  Patient has rarely infrequently filled her Ativan, she filled it once 6 months ago and still should have refills on it. Patient says she has been having a lot of lower back pain still and wants a cortisone injection to help with that.  She says she does get some relief when she gets these. Follow up plan: Return in about 6 months (around 12/07/2021), or if symptoms worsen or fail  to improve, for Thyroid and hypertension and depression.  Counseling provided for all of the vaccine components Orders Placed This Encounter  Procedures   ToxASSURE Select 13 (MW), Urine   CBC with Differential/Platelet   CMP14+EGFR   Lipid panel   TSH   Bayer DCA Hb A1c Waived    Caryl Pina, MD [facility] 06/09/2021, 2:44 PM

## 2021-06-10 LAB — LIPID PANEL
Chol/HDL Ratio: 4.2 ratio (ref 0.0–4.4)
Cholesterol, Total: 204 mg/dL — ABNORMAL HIGH (ref 100–199)
HDL: 49 mg/dL (ref >39)
LDL Chol Calc (NIH): 111 mg/dL — ABNORMAL HIGH (ref 0–99)
Triglycerides: 254 mg/dL — ABNORMAL HIGH (ref 0–149)
VLDL Cholesterol Cal: 44 mg/dL — ABNORMAL HIGH (ref 5–40)

## 2021-06-10 LAB — CBC WITH DIFFERENTIAL/PLATELET
Basophils Absolute: 0.1 10*3/uL (ref 0.0–0.2)
Basos: 1 %
EOS (ABSOLUTE): 0.4 10*3/uL (ref 0.0–0.4)
Eos: 6 %
Hematocrit: 35.3 % (ref 34.0–46.6)
Hemoglobin: 12.2 g/dL (ref 11.1–15.9)
Immature Grans (Abs): 0.1 10*3/uL (ref 0.0–0.1)
Immature Granulocytes: 1 %
Lymphocytes Absolute: 1.3 10*3/uL (ref 0.7–3.1)
Lymphs: 18 %
MCH: 30.4 pg (ref 26.6–33.0)
MCHC: 34.6 g/dL (ref 31.5–35.7)
MCV: 88 fL (ref 79–97)
Monocytes Absolute: 0.5 10*3/uL (ref 0.1–0.9)
Monocytes: 7 %
Neutrophils Absolute: 4.9 10*3/uL (ref 1.4–7.0)
Neutrophils: 67 %
Platelets: 372 10*3/uL (ref 150–450)
RBC: 4.01 x10E6/uL (ref 3.77–5.28)
RDW: 13.1 % (ref 11.7–15.4)
WBC: 7.2 10*3/uL (ref 3.4–10.8)

## 2021-06-10 LAB — CMP14+EGFR
ALT: 15 IU/L (ref 0–32)
AST: 17 IU/L (ref 0–40)
Albumin/Globulin Ratio: 1.6 (ref 1.2–2.2)
Albumin: 4.4 g/dL (ref 3.8–4.9)
Alkaline Phosphatase: 73 IU/L (ref 44–121)
BUN/Creatinine Ratio: 18 (ref 9–23)
BUN: 16 mg/dL (ref 6–24)
Bilirubin Total: <0.2 mg/dL (ref 0.0–1.2)
CO2: 23 mmol/L (ref 20–29)
Calcium: 9.3 mg/dL (ref 8.7–10.2)
Chloride: 100 mmol/L (ref 96–106)
Creatinine, Ser: 0.88 mg/dL (ref 0.57–1.00)
Globulin, Total: 2.8 g/dL (ref 1.5–4.5)
Glucose: 140 mg/dL — ABNORMAL HIGH (ref 70–99)
Potassium: 4.2 mmol/L (ref 3.5–5.2)
Sodium: 138 mmol/L (ref 134–144)
Total Protein: 7.2 g/dL (ref 6.0–8.5)
eGFR: 78 mL/min/{1.73_m2} (ref >59)

## 2021-06-10 LAB — TSH: TSH: 16.4 u[IU]/mL — ABNORMAL HIGH (ref 0.450–4.500)

## 2021-06-13 LAB — TOXASSURE SELECT 13 (MW), URINE

## 2021-06-16 MED ORDER — LEVOTHYROXINE SODIUM 175 MCG PO TABS: 175.0000 ug | ORAL_TABLET | Freq: Every day | ORAL | 3 refills | Status: DC

## 2021-06-16 NOTE — Addendum Note (Signed)
Addended by: Brynda Peon F on: 06/16/2021 02:55 PM   Modules accepted: Orders

## 2021-09-01 ENCOUNTER — Other Ambulatory Visit: Payer: Self-pay | Admitting: Family Medicine

## 2021-09-01 DIAGNOSIS — H25811 Combined forms of age-related cataract, right eye: Secondary | ICD-10-CM | POA: Diagnosis not present

## 2021-09-01 DIAGNOSIS — H25812 Combined forms of age-related cataract, left eye: Secondary | ICD-10-CM | POA: Diagnosis not present

## 2021-09-01 DIAGNOSIS — Z01818 Encounter for other preprocedural examination: Secondary | ICD-10-CM | POA: Diagnosis not present

## 2021-09-01 DIAGNOSIS — I10 Essential (primary) hypertension: Secondary | ICD-10-CM

## 2021-09-15 DIAGNOSIS — H25811 Combined forms of age-related cataract, right eye: Secondary | ICD-10-CM | POA: Diagnosis not present

## 2021-09-23 ENCOUNTER — Other Ambulatory Visit: Payer: Self-pay | Admitting: Family Medicine

## 2021-09-23 DIAGNOSIS — F331 Major depressive disorder, recurrent, moderate: Secondary | ICD-10-CM

## 2021-09-23 DIAGNOSIS — I10 Essential (primary) hypertension: Secondary | ICD-10-CM

## 2021-10-06 DIAGNOSIS — H25812 Combined forms of age-related cataract, left eye: Secondary | ICD-10-CM | POA: Diagnosis not present

## 2021-10-08 ENCOUNTER — Telehealth: Payer: Medicare Other | Admitting: Physician Assistant

## 2021-10-08 DIAGNOSIS — J069 Acute upper respiratory infection, unspecified: Secondary | ICD-10-CM | POA: Diagnosis not present

## 2021-10-09 MED ORDER — BENZONATATE 100 MG PO CAPS
100.0000 mg | ORAL_CAPSULE | Freq: Three times a day (TID) | ORAL | 0 refills | Status: DC | PRN
Start: 2021-10-09 — End: 2022-02-17

## 2021-10-09 NOTE — Progress Notes (Signed)

## 2021-10-09 NOTE — Progress Notes (Signed)
I have spent 5 minutes in review of e-visit questionnaire, review and updating patient chart, medical decision making and response to patient.   Juleah Paradise Cody Bayyinah Dukeman, PA-C    

## 2021-11-05 ENCOUNTER — Other Ambulatory Visit: Payer: Self-pay | Admitting: Family Medicine

## 2021-11-05 DIAGNOSIS — F331 Major depressive disorder, recurrent, moderate: Secondary | ICD-10-CM

## 2021-11-27 ENCOUNTER — Encounter: Payer: Self-pay | Admitting: Family Medicine

## 2021-11-27 ENCOUNTER — Ambulatory Visit (INDEPENDENT_AMBULATORY_CARE_PROVIDER_SITE_OTHER): Payer: Medicare Other | Admitting: Family Medicine

## 2021-11-27 VITALS — BP 138/83 | HR 83 | Temp 98.4°F | Ht 61.0 in | Wt 227.0 lb

## 2021-11-27 DIAGNOSIS — E890 Postprocedural hypothyroidism: Secondary | ICD-10-CM | POA: Diagnosis not present

## 2021-11-27 DIAGNOSIS — F331 Major depressive disorder, recurrent, moderate: Secondary | ICD-10-CM | POA: Diagnosis not present

## 2021-11-27 DIAGNOSIS — Z1231 Encounter for screening mammogram for malignant neoplasm of breast: Secondary | ICD-10-CM

## 2021-11-27 DIAGNOSIS — I10 Essential (primary) hypertension: Secondary | ICD-10-CM

## 2021-11-27 DIAGNOSIS — G8929 Other chronic pain: Secondary | ICD-10-CM

## 2021-11-27 DIAGNOSIS — M545 Low back pain, unspecified: Secondary | ICD-10-CM

## 2021-11-27 MED ORDER — GABAPENTIN 300 MG PO CAPS: ORAL_CAPSULE | ORAL | 3 refills | Status: DC

## 2021-11-27 MED ORDER — METHYLPREDNISOLONE ACETATE 40 MG/ML IJ SUSP
40.0000 mg | Freq: Once | INTRAMUSCULAR | Status: AC
  Administered 2021-11-27: 80 mg via INTRAMUSCULAR

## 2021-11-27 MED ORDER — HYDROCHLOROTHIAZIDE 25 MG PO TABS: 25.0000 mg | ORAL_TABLET | Freq: Every day | ORAL | 3 refills | Status: DC

## 2021-11-27 MED ORDER — METHYLPREDNISOLONE ACETATE 80 MG/ML IJ SUSP: 80.0000 mg | Freq: Once | INTRAMUSCULAR | Status: DC

## 2021-11-27 MED ORDER — SERTRALINE HCL 100 MG PO TABS: 150.0000 mg | ORAL_TABLET | Freq: Every day | ORAL | 3 refills | Status: DC

## 2021-11-27 MED ORDER — LORAZEPAM 0.5 MG PO TABS: ORAL_TABLET | ORAL | 5 refills | Status: DC

## 2021-11-27 NOTE — Addendum Note (Signed)
Addended by: Alphonzo Dublin on: 11/27/2021 02:49 PM   Modules accepted: Orders

## 2021-11-27 NOTE — Addendum Note (Signed)
Addended by: Caryl Pina on: 11/27/2021 02:02 PM   Modules accepted: Orders

## 2021-11-27 NOTE — Addendum Note (Signed)
Addended by: Caryl Pina on: 11/27/2021 02:22 PM   Modules accepted: Orders

## 2021-11-27 NOTE — Progress Notes (Addendum)
BP 138/83   Pulse 83   Temp 98.4 F (36.9 C)   Ht '5\' 1"'$  (1.549 m)   Wt 227 lb (103 kg)   LMP  (LMP Unknown) Comment: FULL   SpO2 97%   BMI 42.89 kg/m    Subjective:   Patient ID: Destiny [name], female    DOB: [birth year], 56 y.o.   MRN: [id number]  HPI: Destiny [name] is a 56 y.o. female presenting on 11/27/2021 for Medical Management of Chronic Issues and Hypothyroidism   HPI Depression recheck Patient is coming in for depression recheck.  She is currently on Zoloft and then uses trazodone in the evening and uses lorazepam as needed but uses it very infrequently.  Patient is also on gabapentin for neuropathy and it does help.  She feels like she is very anxious and having more stress because of strained relationship between her stepfather and her mother and her.  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  Hypertension Patient is currently on hydrochlorothiazide, and their blood pressure today is 138/83. Patient denies any lightheadedness or dizziness. Patient denies headaches, blurred vision, chest pains, shortness of breath, or weakness. Denies any side effects from medication and is content with current medication.   Hypothyroidism recheck Patient is coming in for thyroid recheck today as well. They deny any issues with hair changes or heat or cold problems or diarrhea or constipation. They deny any chest pain or palpitations. They are currently on levothyroxine 175 micrograms   Patient is having low back pain and wants a cortisone shot.  She has degenerative disc disease and she says she gets them every now and then.  Relevant past medical, surgical, family and social history reviewed and updated as indicated. Interim medical history since our last visit reviewed. Allergies and medications reviewed and updated.  Review of Systems  Constitutional:  Negative for chills and fever.  Eyes:  Negative for redness and visual  disturbance.  Respiratory:  Negative for chest tightness and shortness of breath.   Cardiovascular:  Negative for chest pain and leg swelling.  Musculoskeletal:  Positive for back pain. Negative for arthralgias and gait problem.  Skin:  Negative for rash.  Neurological:  Negative for dizziness, light-headedness and headaches.  Psychiatric/Behavioral:  Negative for agitation and behavioral problems.   All other systems reviewed and are negative.   Per HPI unless specifically indicated above   Allergies as of 11/27/2021       Reactions   Latex Itching, Rash        Medication List        Accurate as of November 27, 2021  2:01 PM. If you have any questions, ask your nurse or doctor.          acetaminophen 500 MG tablet Commonly known as: TYLENOL Take 1,000 mg by mouth every 8 (eight) hours as needed for moderate pain.   azelastine 0.05 % ophthalmic solution Commonly known as: OPTIVAR Apply 1 drop to eye 2 (two) times daily.   benzonatate 100 MG capsule Commonly known as: TESSALON Take 1 capsule (100 mg total) by mouth 3 (three) times daily as needed for cough.   colestipol 1 g tablet Commonly known as: COLESTID Take by mouth.   cyclobenzaprine 10 MG tablet Commonly known as: FLEXERIL TAKE 1 TABLET BY MOUTH AT BEDTIME AS NEEDED FOR MUSCLE SPASMS.   diclofenac 75 MG EC tablet Commonly known as: VOLTAREN Take  1 tab twice daily for muscle and joint pain   dicyclomine 20 MG tablet Commonly known as: BENTYL Take by mouth.   diphenoxylate-atropine 2.5-0.025 MG tablet Commonly known as: LOMOTIL Take 2 tablets by mouth 4 (four) times daily as needed.   fluconazole 200 MG tablet Commonly known as: DIFLUCAN Take 200 mg by mouth daily.   fluticasone 50 MCG/ACT nasal spray Commonly known as: FLONASE Place 1 spray into both nostrils 2 (two) times daily.   gabapentin 300 MG capsule Commonly known as: NEURONTIN TAKE 1 CAPSULE BY MOUTH THREE TIMES A DAY    hydrochlorothiazide 25 MG tablet Commonly known as: HYDRODIURIL Take 1 tablet (25 mg total) by mouth daily.   levothyroxine 175 MCG tablet Commonly known as: SYNTHROID Take 1 tablet (175 mcg total) by mouth daily.   LORazepam 0.5 MG tablet Commonly known as: ATIVAN TAKE 1 TABLET (0.5 MG) BY MOUTH AT BEDTIME AS NEEDED FOR ANXIETY   methocarbamol 500 MG tablet Commonly known as: ROBAXIN Take by mouth.   multivitamin with minerals tablet Take 1 tablet by mouth daily.   naproxen sodium 220 MG tablet Commonly known as: ALEVE Take by mouth.   ondansetron 8 MG tablet Commonly known as: ZOFRAN Take 1 tablet by mouth every 8 hours as needed for nausea or vomiting.   QC TUMERIC COMPLEX PO Take 1 capsule by mouth daily.   sertraline 100 MG tablet Commonly known as: ZOLOFT Take 1.5 tablets (150 mg total) by mouth daily. What changed: how much to take Changed by: Worthy Rancher, MD   traZODone 100 MG tablet Commonly known as: DESYREL Take 1 tablet (100 mg total) by mouth at bedtime.   vitamin B-12 100 MCG tablet Commonly known as: CYANOCOBALAMIN Take 100 mcg by mouth daily.   vitamin C 500 MG tablet Commonly known as: ASCORBIC ACID Take 500 mg by mouth daily.   VITAMIN D3 PO Take by mouth.   zinc gluconate 50 MG tablet Take 50 mg by mouth daily.         Objective:   BP 138/83   Pulse 83   Temp 98.4 F (36.9 C)   Ht '5\' 1"'$  (1.549 m)   Wt 227 lb (103 kg)   LMP  (LMP Unknown) Comment: FULL   SpO2 97%   BMI 42.89 kg/m   Wt Readings from Last 3 Encounters:  11/27/21 227 lb (103 kg)  06/09/21 233 lb (105.7 kg)  06/04/21 225 lb (102.1 kg)    Physical Exam Vitals and nursing note reviewed.  Constitutional:      General: She is not in acute distress.    Appearance: She is well-developed. She is not diaphoretic.  Eyes:     Conjunctiva/sclera: Conjunctivae normal.  Cardiovascular:     Rate and Rhythm: Normal rate and regular rhythm.     Heart sounds:  Normal heart sounds. No murmur heard. Pulmonary:     Effort: Pulmonary effort is normal. No respiratory distress.     Breath sounds: Normal breath sounds. No wheezing.  Musculoskeletal:        General: No tenderness. Normal range of motion.  Skin:    General: Skin is warm and dry.     Findings: No rash.  Neurological:     Mental Status: She is alert and oriented to person, place, and time.     Coordination: Coordination normal.  Psychiatric:        Behavior: Behavior normal.       Assessment & Plan:  Problem List Items Addressed This Visit       Cardiovascular and Mediastinum   Essential hypertension, benign   Relevant Medications   hydrochlorothiazide (HYDRODIURIL) 25 MG tablet     Endocrine   Hypothyroidism     Other   Depression   Relevant Medications   gabapentin (NEURONTIN) 300 MG capsule   LORazepam (ATIVAN) 0.5 MG tablet   sertraline (ZOLOFT) 100 MG tablet   Other Visit Diagnoses     Encounter for screening mammogram for malignant neoplasm of breast    -  Primary   Relevant Orders   MM 3D SCREEN BREAST BILATERAL   Chronic low back pain, unspecified back pain laterality, unspecified whether sciatica present       Relevant Medications   gabapentin (NEURONTIN) 300 MG capsule   sertraline (ZOLOFT) 100 MG tablet   methylPREDNISolone acetate (DEPO-MEDROL) injection 80 mg (Start on 11/27/2021  2:15 PM)       We will check blood work today, increase her Zoloft to 150 mg daily to see if helps little more with anxiety.  She is doing much more anxiety with her family because her stepfather will not let her see her mother because he has accused her of stealing things Follow up plan: Return in about 6 months (around 05/29/2022), or if symptoms worsen or fail to improve, for Thyroid and depression-.  Counseling provided for all of the vaccine components Orders Placed This Encounter  Procedures   MM 3D [place] Lilie Vezina, MD [facility] 11/27/2021, 2:01 PM

## 2021-11-28 LAB — CBC WITH DIFFERENTIAL/PLATELET
Basophils Absolute: 0 10*3/uL (ref 0.0–0.2)
Basos: 0 %
EOS (ABSOLUTE): 0.3 10*3/uL (ref 0.0–0.4)
Eos: 4 %
Hematocrit: 34.9 % (ref 34.0–46.6)
Hemoglobin: 11.9 g/dL (ref 11.1–15.9)
Immature Grans (Abs): 0 10*3/uL (ref 0.0–0.1)
Immature Granulocytes: 0 %
Lymphocytes Absolute: 1.4 10*3/uL (ref 0.7–3.1)
Lymphs: 20 %
MCH: 29.8 pg (ref 26.6–33.0)
MCHC: 34.1 g/dL (ref 31.5–35.7)
MCV: 88 fL (ref 79–97)
Monocytes Absolute: 0.7 10*3/uL (ref 0.1–0.9)
Monocytes: 9 %
Neutrophils Absolute: 4.9 10*3/uL (ref 1.4–7.0)
Neutrophils: 67 %
Platelets: 390 10*3/uL (ref 150–450)
RBC: 3.99 x10E6/uL (ref 3.77–5.28)
RDW: 13 % (ref 11.7–15.4)
WBC: 7.3 10*3/uL (ref 3.4–10.8)

## 2021-11-28 LAB — CMP14+EGFR
ALT: 13 IU/L (ref 0–32)
AST: 14 IU/L (ref 0–40)
Albumin/Globulin Ratio: 1.3 (ref 1.2–2.2)
Albumin: 4.1 g/dL (ref 3.8–4.9)
Alkaline Phosphatase: 67 IU/L (ref 44–121)
BUN/Creatinine Ratio: 12 (ref 9–23)
BUN: 9 mg/dL (ref 6–24)
Bilirubin Total: 0.2 mg/dL (ref 0.0–1.2)
CO2: 23 mmol/L (ref 20–29)
Calcium: 9 mg/dL (ref 8.7–10.2)
Chloride: 102 mmol/L (ref 96–106)
Creatinine, Ser: 0.78 mg/dL (ref 0.57–1.00)
Globulin, Total: 3.2 g/dL (ref 1.5–4.5)
Glucose: 78 mg/dL (ref 70–99)
Potassium: 4 mmol/L (ref 3.5–5.2)
Sodium: 141 mmol/L (ref 134–144)
Total Protein: 7.3 g/dL (ref 6.0–8.5)
eGFR: 89 mL/min/{1.73_m2} (ref >59)

## 2021-11-28 LAB — LIPID PANEL
Chol/HDL Ratio: 4.1 ratio (ref 0.0–4.4)
Cholesterol, Total: 203 mg/dL — ABNORMAL HIGH (ref 100–199)
HDL: 49 mg/dL (ref >39)
LDL Chol Calc (NIH): 115 mg/dL — ABNORMAL HIGH (ref 0–99)
Triglycerides: 225 mg/dL — ABNORMAL HIGH (ref 0–149)
VLDL Cholesterol Cal: 39 mg/dL (ref 5–40)

## 2021-11-28 LAB — TSH: TSH: 14.4 u[IU]/mL — ABNORMAL HIGH (ref 0.450–4.500)

## 2021-12-03 ENCOUNTER — Ambulatory Visit
Admission: RE | Admit: 2021-12-03 | Discharge: 2021-12-03 | Disposition: A | Discharge status: Home / Self Care | Payer: Medicare Other | Source: Ambulatory Visit | Attending: Family Medicine | Admitting: Family Medicine

## 2021-12-03 ENCOUNTER — Inpatient Hospital Stay: Admission: RE | Admit: 2021-12-03 | Payer: Medicare Other | Source: Ambulatory Visit

## 2021-12-03 DIAGNOSIS — Z1231 Encounter for screening mammogram for malignant neoplasm of breast: Secondary | ICD-10-CM | POA: Diagnosis not present

## 2021-12-22 ENCOUNTER — Other Ambulatory Visit: Payer: Self-pay | Admitting: Family Medicine

## 2021-12-22 DIAGNOSIS — F331 Major depressive disorder, recurrent, moderate: Secondary | ICD-10-CM

## 2022-01-09 DIAGNOSIS — H04123 Dry eye syndrome of bilateral lacrimal glands: Secondary | ICD-10-CM | POA: Diagnosis not present

## 2022-01-30 DIAGNOSIS — C20 Malignant neoplasm of rectum: Secondary | ICD-10-CM | POA: Diagnosis not present

## 2022-02-16 ENCOUNTER — Other Ambulatory Visit: Payer: Medicare Other

## 2022-02-16 ENCOUNTER — Ambulatory Visit: Payer: Medicare Other | Admitting: Nurse Practitioner

## 2022-02-17 ENCOUNTER — Telehealth (INDEPENDENT_AMBULATORY_CARE_PROVIDER_SITE_OTHER): Payer: Medicare Other | Admitting: Family Medicine

## 2022-02-17 DIAGNOSIS — J069 Acute upper respiratory infection, unspecified: Secondary | ICD-10-CM | POA: Diagnosis not present

## 2022-02-17 MED ORDER — FLUTICASONE PROPIONATE 50 MCG/ACT NA SUSP: 1.0000 | Freq: Two times a day (BID) | NASAL | 2 refills | Status: DC

## 2022-02-17 MED ORDER — PROMETHAZINE-DM 6.25-15 MG/5ML PO SYRP: 2.5000 mL | ORAL_SOLUTION | Freq: Four times a day (QID) | ORAL | 0 refills | Status: DC | PRN

## 2022-02-17 MED ORDER — BENZONATATE 100 MG PO CAPS: 100.0000 mg | ORAL_CAPSULE | Freq: Three times a day (TID) | ORAL | 0 refills | Status: DC | PRN

## 2022-02-17 NOTE — Progress Notes (Signed)
MyChart Video visit  Subjective: CC: sinusitis PCP: Dettinger, Fransisca Kaufmann, MD URK:YHCW Destiny [name] is a 56 y.o. female. Patient provides verbal consent for consult held via video.  Due to COVID-19 pandemic this visit was conducted virtually. This visit type was conducted due to national recommendations for restrictions regarding the COVID-19 Pandemic (e.g. social distancing, sheltering in place) in an effort to limit this patient's exposure and mitigate transmission in our community. All issues noted in this document were discussed and addressed.  A physical exam was not performed with this format.   [place] Location of provider: [facility] Others present for call: mom  1. Sinusitis She reports maxillary pressure and sinus headache.  She reports cough, hoarseness.  She has been using delsym and pseudofed.  Nothing is helping though.  She has had this for a week now and symptoms are getting worse.  She has done a home COVID test and it was negative. No purulence from nose.  No brown or bloody sputum.  No shortness of breath. No fevers.   ROS: Per HPI  Allergies  Allergen Reactions   Latex Itching and Rash   Past Medical History:  Diagnosis Date   Chronic back pain    Depression    Hypertension    Hyperthyroidism    Rectal cancer ([name]) 10/29/2015   rectal invasice adenocarcinoma,Adenocarcinoma dx 10/29/15; Stage III, T3, N1, [place]    Current Outpatient Medications:    acetaminophen (TYLENOL) 500 MG tablet, Take 1,000 mg by mouth every 8 (eight) hours as needed for moderate pain., Disp: , Rfl:    azelastine (OPTIVAR) 0.05 % ophthalmic solution, Apply 1 drop to eye 2 (two) times daily., Disp: , Rfl:    benzonatate (TESSALON) 100 MG capsule, Take 1 capsule (100 mg total) by mouth 3 (three) times daily as needed for cough., Disp: 30 capsule, Rfl: 0   Cholecalciferol (VITAMIN D3 PO), Take by mouth., Disp: , Rfl:    colestipol (COLESTID) 1 g tablet, Take by mouth., Disp: , Rfl:     cyclobenzaprine (FLEXERIL) 10 MG tablet, TAKE 1 TABLET BY MOUTH AT BEDTIME AS NEEDED FOR MUSCLE SPASMS., Disp: 30 tablet, Rfl: 2   diclofenac (VOLTAREN) 75 MG EC tablet, Take 1 tab twice daily for muscle and joint pain, Disp: 60 tablet, Rfl: 1   dicyclomine (BENTYL) 20 MG tablet, Take by mouth., Disp: , Rfl:    diphenoxylate-atropine (LOMOTIL) 2.5-0.025 MG tablet, Take 2 tablets by mouth 4 (four) times daily as needed., Disp: , Rfl:    fluconazole (DIFLUCAN) 200 MG tablet, Take 200 mg by mouth daily., Disp: , Rfl:    fluticasone (FLONASE) 50 MCG/ACT nasal spray, Place 1 spray into both nostrils 2 (two) times daily., Disp: 16 g, Rfl: 2   gabapentin (NEURONTIN) 300 MG capsule, TAKE 1 CAPSULE BY MOUTH THREE TIMES A DAY, Disp: 90 capsule, Rfl: 3   hydrochlorothiazide (HYDRODIURIL) 25 MG tablet, Take 1 tablet (25 mg total) by mouth daily., Disp: 90 tablet, Rfl: 3   levothyroxine (SYNTHROID) 175 MCG tablet, Take 1 tablet (175 mcg total) by mouth daily., Disp: 90 tablet, Rfl: 3   LORazepam (ATIVAN) 0.5 MG tablet, TAKE 1 TABLET (0.5 MG) BY MOUTH AT BEDTIME AS NEEDED FOR ANXIETY, Disp: 60 tablet, Rfl: 5   methocarbamol (ROBAXIN) 500 MG tablet, Take by mouth., Disp: , Rfl:    Multiple Vitamins-Minerals (MULTIVITAMIN WITH MINERALS) tablet, Take 1 tablet by mouth daily., Disp: , Rfl:    naproxen sodium (ALEVE) 220 MG tablet,  Take by mouth., Disp: , Rfl:    ondansetron (ZOFRAN) 8 MG tablet, Take 1 tablet by mouth every 8 hours as needed for nausea or vomiting., Disp: 30 tablet, Rfl: 0   sertraline (ZOLOFT) 100 MG tablet, TAKE 1 TABLET BY MOUTH EVERY DAY, Disp: 90 tablet, Rfl: 1   traZODone (DESYREL) 100 MG tablet, Take 1 tablet (100 mg total) by mouth at bedtime., Disp: 90 tablet, Rfl: 3   Turmeric (QC TUMERIC COMPLEX PO), Take 1 capsule by mouth daily., Disp: , Rfl:    vitamin B-12 (CYANOCOBALAMIN) 100 MCG tablet, Take 100 mcg by mouth daily., Disp: , Rfl:    vitamin C (ASCORBIC ACID) 500 MG tablet, Take 500  mg by mouth daily., Disp: , Rfl:    zinc gluconate 50 MG tablet, Take 50 mg by mouth daily., Disp: , Rfl:   Gen: Nontoxic female in no acute distress Pulm: Normal work of breathing on room air  Assessment/ Plan: 56 y.o. female   Viral URI with cough - Plan: fluticasone (FLONASE) 50 MCG/ACT nasal spray, benzonatate (TESSALON) 100 MG capsule, promethazine-dextromethorphan (PROMETHAZINE-DM) 6.25-15 MG/5ML syrup  Suspect viral mediated symptoms.  She presents no bacterial symptoms.  I did encourage her to recheck herself for COVID-19 and ensure that the COVID tests are in fact not out of date.  We have had quite a surge of COVID-19 in the community and much of her symptoms are very suspicious for that.  I am going to treat her symptomatically with Tessalon Perles, promethazine with dextromethorphan as needed and renewed her Flonase.  We discussed red flag signs and symptoms and signs and symptoms of bacterial infection which would warrant further evaluation and/or antibiotic treatment.  She voiced good understanding will follow-up as needed  Start time: 1:03p End time: 1:09pm  Total time spent on patient care (including video visit/ documentation): 6 minutes  [place], [place] [phone]

## 2022-02-18 ENCOUNTER — Other Ambulatory Visit: Payer: Self-pay | Admitting: Family Medicine

## 2022-02-18 ENCOUNTER — Other Ambulatory Visit: Payer: Self-pay | Admitting: *Deleted

## 2022-02-18 DIAGNOSIS — C20 Malignant neoplasm of rectum: Secondary | ICD-10-CM

## 2022-02-19 ENCOUNTER — Telehealth: Payer: Self-pay

## 2022-02-19 ENCOUNTER — Inpatient Hospital Stay: Payer: Medicare Other

## 2022-02-19 ENCOUNTER — Other Ambulatory Visit: Payer: Self-pay | Admitting: Nurse Practitioner

## 2022-02-19 ENCOUNTER — Inpatient Hospital Stay: Payer: Medicare Other | Attending: Nurse Practitioner | Admitting: Nurse Practitioner

## 2022-02-19 ENCOUNTER — Encounter: Payer: Self-pay | Admitting: Nurse Practitioner

## 2022-02-19 VITALS — BP 157/79 | HR 84 | Temp 98.2°F | Resp 18 | Ht 61.0 in | Wt 228.4 lb

## 2022-02-19 DIAGNOSIS — R350 Frequency of micturition: Secondary | ICD-10-CM

## 2022-02-19 DIAGNOSIS — I10 Essential (primary) hypertension: Secondary | ICD-10-CM | POA: Diagnosis not present

## 2022-02-19 DIAGNOSIS — N390 Urinary tract infection, site not specified: Secondary | ICD-10-CM

## 2022-02-19 DIAGNOSIS — C20 Malignant neoplasm of rectum: Secondary | ICD-10-CM

## 2022-02-19 DIAGNOSIS — Z85048 Personal history of other malignant neoplasm of rectum, rectosigmoid junction, and anus: Secondary | ICD-10-CM | POA: Insufficient documentation

## 2022-02-19 LAB — URINALYSIS, COMPLETE (UACMP) WITH MICROSCOPIC
Bilirubin Urine: NEGATIVE
Glucose, UA: NEGATIVE mg/dL
Hgb urine dipstick: NEGATIVE
Ketones, ur: NEGATIVE mg/dL
Nitrite: POSITIVE — AB
Protein, ur: NEGATIVE mg/dL
Specific Gravity, Urine: 1.023 (ref 1.005–1.030)
pH: 5.5 (ref 5.0–8.0)

## 2022-02-19 LAB — CEA (ACCESS): CEA (CHCC): 1.18 ng/mL (ref 0.00–5.00)

## 2022-02-19 MED ORDER — SULFAMETHOXAZOLE-TRIMETHOPRIM 800-160 MG PO TABS: 1.0000 | ORAL_TABLET | Freq: Two times a day (BID) | ORAL | 0 refills | Status: AC

## 2022-02-19 NOTE — Telephone Encounter (Signed)
Patient have verbal understanding and had no further questions or concerns 

## 2022-02-19 NOTE — Telephone Encounter (Signed)
-----   Message from Destiny Shark, NP sent at 02/19/2022  2:45 PM EDT ----- Please let her know the urinalysis is consistent with an infection.  I sent a prescription to her pharmacy for Septra.  She should follow-up with PCP if symptoms do not resolve.

## 2022-02-19 NOTE — Progress Notes (Signed)
  [facility] OFFICE PROGRESS NOTE   Diagnosis: Rectal cancer  INTERVAL HISTORY:   Ms. Benefiel returns as scheduled.  She continues to have irregular/erratic bowel habits.  No bleeding.  Occasional pain with bowel movements.  She has an intermittent discomfort which she describes as a "cramp" at the right abdomen at the site of the previous ileostomy.  She has a good appetite.  For the past 2 days she has noted urinary frequency.  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  Objective:  Vital signs in last 24 hours:  Blood pressure (!) 157/79, pulse 84, temperature 98.2 F (36.8 C), temperature source Oral, resp. rate 18, height $RemoveBe'5\' 1"'rXZAIFWfa$  (1.549 m), weight 228 lb 6.4 oz (103.6 kg), SpO2 98 %.    Lymphatics: No palpable cervical, supraclavicular, axillary or inguinal lymph nodes. Resp: Lungs clear bilaterally. Cardio: Regular rate and rhythm. GI: Abdomen soft and nontender.  No hepatosplenomegaly. Vascular: No leg edema. Neuro: Alert and oriented.   Lab Results:  Lab Results  Component Value Date   WBC 7.3 11/27/2021   HGB 11.9 11/27/2021   HCT 34.9 11/27/2021   MCV 88 11/27/2021   PLT 390 11/27/2021   NEUTROABS 4.9 11/27/2021    Imaging:  No results found.  Medications: I have reviewed the patient's current medications.  Assessment/Plan: Rectal cancer-clinical stage III (T3 N1) No loss of mismatch repair protein expression T3 N1 tumor beginning at 2 cm from the anal verge on an MRI of the pelvis 11/08/2015 CTs of the chest, abdomen, and pelvis 11/01/2015 with no evidence of distant metastatic disease Initiation of neoadjuvant radiation/Xeloda 11/25/2015; completion of radiation/Xeloda 01/02/2016 Low anterior resection with a coloanal anastomosis 02/26/2016,ypT2,ypN0. Grade 3 adenocarcinoma, negative resection margins 02/26/2016 No BRAF, KRAS, NRAS mutation; Microsatellite status could not be determined CT abdomen/pelvis 03/30/2016-no evidence of  recurrent rectal cancer Cycle 1 adjuvant Xeloda 04/14/2016 Cycle 2 adjuvant Xeloda 05/08/2016 Cycle 3 adjuvant Xeloda 05/29/2016 Cycle 4 adjuvant Xeloda 06/20/2016 (completed approximately 10 days) Cycle 5 adjuvant Xeloda started approximately 07/19/2016 CT 12/04/2016-no evidence of metastatic disease CT 12/08/2017- no evidence of metastatic disease CT 12/13/2018-no evidence of metastatic disease   2. History of hypertension   3. Left ureteral injury at the low anterior resection-status post a left ureteral neocystostomy and left double-J stent placement 02/26/2016   4. Hyperthyroidism status post radioactive iodine 05/12/2016   5. Ileostomy reversal 09/02/2016   6. Hospitalization 10/20/2016 through 10/22/2016 with UTI/sepsis.     Disposition: Ms. Lanese remains in clinical remission from rectal cancer.  We will follow-up on the CEA from today.  She continues colonoscopy surveillance with Dr. Hendricks Limes.  She submitted a urine specimen today to evaluate complaint of urinary frequency.    She will return for CEA and follow-up visit in 1 year.   Ned Card ANP/GNP-BC   02/19/2022  1:12 PM

## 2022-02-20 ENCOUNTER — Telehealth: Payer: Self-pay

## 2022-02-20 NOTE — Telephone Encounter (Signed)
-----   Message from Owens Shark, NP sent at 02/20/2022  8:49 AM EDT ----- Please let her know CEA is stable in normal range.

## 2022-02-20 NOTE — Telephone Encounter (Signed)
Patient gave verbal understanding and had no further questions or concerns  

## 2022-02-22 LAB — URINE CULTURE: Culture: >=100000 — AB

## 2022-03-16 ENCOUNTER — Other Ambulatory Visit: Payer: Self-pay

## 2022-03-16 ENCOUNTER — Encounter (HOSPITAL_COMMUNITY): Payer: Self-pay

## 2022-03-16 ENCOUNTER — Emergency Department (HOSPITAL_COMMUNITY): Payer: 59

## 2022-03-16 ENCOUNTER — Emergency Department (HOSPITAL_COMMUNITY)
Admission: EM | Admit: 2022-03-16 | Discharge: 2022-03-16 | Disposition: A | Discharge status: Home / Self Care | Payer: 59 | Attending: Emergency Medicine | Admitting: Emergency Medicine

## 2022-03-16 DIAGNOSIS — Z91040 Latex allergy status: Secondary | ICD-10-CM | POA: Diagnosis not present

## 2022-03-16 DIAGNOSIS — R059 Cough, unspecified: Secondary | ICD-10-CM | POA: Diagnosis present

## 2022-03-16 DIAGNOSIS — U071 COVID-19: Secondary | ICD-10-CM | POA: Diagnosis not present

## 2022-03-16 LAB — SARS CORONAVIRUS 2 BY RT PCR: SARS Coronavirus 2 by RT PCR: POSITIVE — AB

## 2022-03-16 MED ORDER — MOLNUPIRAVIR EUA 200MG CAPSULE: 4.0000 | ORAL_CAPSULE | Freq: Two times a day (BID) | ORAL | 0 refills | Status: AC

## 2022-03-16 MED ORDER — ALBUTEROL SULFATE HFA 108 (90 BASE) MCG/ACT IN AERS: 1.0000 | INHALATION_SPRAY | Freq: Four times a day (QID) | RESPIRATORY_TRACT | 0 refills | Status: AC | PRN

## 2022-03-16 MED ORDER — ACETAMINOPHEN 325 MG PO TABS
650.0000 mg | ORAL_TABLET | Freq: Once | ORAL | Status: AC | PRN
  Administered 2022-03-16: 650 mg via ORAL
  Filled 2022-03-16: qty 2

## 2022-03-16 MED ORDER — BENZONATATE 100 MG PO CAPS: 100.0000 mg | ORAL_CAPSULE | Freq: Three times a day (TID) | ORAL | 0 refills | Status: DC

## 2022-03-16 NOTE — Discharge Instructions (Signed)
Your COVID test today is positive.  This is likely the cause of your current symptoms.  I recommend that you alternate Tylenol and ibuprofen every 4 and 6 hours for fever and/or body aches.  Start the antiviral medications today.  Use the albuterol inhaler 1 to 2 puffs every 4-6 hours as needed.  Follow-up with your primary care provider for recheck.  Return emergency department for any new or worsening symptoms.

## 2022-03-16 NOTE — ED Triage Notes (Signed)
Pt presents to ED with complaints of generalized body aches, cough, congestion started yesterday.

## 2022-03-19 NOTE — ED Provider Notes (Signed)
[facility] EMERGENCY DEPARTMENT Provider Note   CSN: [id number] Arrival date & time: 03/16/22  1203     History  Chief Complaint  Patient presents with   Cough    Destiny [name] is a 56 y.o. female.   Cough Associated symptoms: myalgias   Associated symptoms: no chest pain, no chills, no ear pain, no fever, no rash, no shortness of breath and no sore throat         Destiny [name] is a 56 y.o. female who presents to the Emergency Department complaining of cough, generalized body aches, nasal congestion and fatigue.  Symptoms began 1 day ago.  States that she has been in and out of the hospital caring for her elderly mother.  States her cough is mostly been nonproductive.  No chest pain or shortness of breath.  She denies any sore throat or ear pain.  No known fever at home.   Home Medications Prior to Admission medications   Medication Sig Start Date End Date Taking? Authorizing Provider  albuterol (VENTOLIN HFA) 108 (90 Base) MCG/ACT inhaler Inhale 1-2 puffs into the lungs every 6 (six) hours as needed for wheezing or shortness of breath. 03/16/22  Yes Hanzel Pizzo, PA-C  benzonatate (TESSALON) 100 MG capsule Take 1 capsule (100 mg total) by mouth every 8 (eight) hours. 03/16/22  Yes Emylia Latella, PA-C  cyclobenzaprine (FLEXERIL) 10 MG tablet TAKE 1 TABLET BY MOUTH AT BEDTIME AS NEEDED FOR MUSCLE SPASMS. Patient not taking: Reported on 02/19/2022 02/18/22   Dettinger, Fransisca Kaufmann, MD  molnupiravir EUA (LAGEVRIO) 200 mg CAPS capsule Take 4 capsules (800 mg total) by mouth 2 (two) times daily for 5 days. 03/16/22 03/21/22 Yes Pearlee Arvizu, PA-C  acetaminophen (TYLENOL) 500 MG tablet Take 1,000 mg by mouth every 8 (eight) hours as needed for moderate pain.    [provider]  azelastine (OPTIVAR) 0.05 % ophthalmic solution Apply 1 drop to eye 2 (two) times daily. 01/30/21   [provider]  Cholecalciferol (VITAMIN D3 PO) Take by mouth.    [provider]   colestipol (COLESTID) 1 g tablet Take by mouth. 04/21/21   [provider]  diclofenac (VOLTAREN) 75 MG EC tablet Take 1 tab twice daily for muscle and joint pain 04/23/20   Dettinger, Fransisca Kaufmann, MD  dicyclomine (BENTYL) 20 MG tablet Take by mouth. 05/29/21 05/29/22  [provider]  diphenoxylate-atropine (LOMOTIL) 2.5-0.025 MG tablet Take 2 tablets by mouth 4 (four) times daily as needed. 05/27/21   [provider]  fluconazole (DIFLUCAN) 200 MG tablet Take 200 mg by mouth daily. 05/27/21   [provider]  fluticasone (FLONASE) 50 MCG/ACT nasal spray Place 1 spray into both nostrils 2 (two) times daily. 02/17/22   Janora Norlander, DO  gabapentin (NEURONTIN) 300 MG capsule TAKE 1 CAPSULE BY MOUTH THREE TIMES A DAY 11/27/21   Dettinger, Fransisca Kaufmann, MD  hydrochlorothiazide (HYDRODIURIL) 25 MG tablet Take 1 tablet (25 mg total) by mouth daily. 11/27/21   Dettinger, Fransisca Kaufmann, MD  levothyroxine (SYNTHROID) 175 MCG tablet Take 1 tablet (175 mcg total) by mouth daily. 06/16/21   Dettinger, Fransisca Kaufmann, MD  LORazepam (ATIVAN) 0.5 MG tablet TAKE 1 TABLET (0.5 MG) BY MOUTH AT BEDTIME AS NEEDED FOR ANXIETY 11/27/21   Dettinger, Fransisca Kaufmann, MD  Multiple Vitamins-Minerals (MULTIVITAMIN WITH MINERALS) tablet Take 1 tablet by mouth daily.    [provider]  naproxen sodium (ALEVE) 220 MG tablet Take by mouth.  [provider]  ondansetron (ZOFRAN) 8 MG tablet Take 1 tablet by mouth every 8 hours as needed for nausea or vomiting. 06/09/17   Ladell Pier, MD  promethazine-dextromethorphan (PROMETHAZINE-DM) 6.25-15 MG/5ML syrup Take 2.5 mLs by mouth 4 (four) times daily as needed for cough. 02/17/22   Ronnie Doss M, DO  sertraline (ZOLOFT) 100 MG tablet TAKE 1 TABLET BY MOUTH EVERY DAY 12/22/21   Dettinger, Fransisca Kaufmann, MD  traZODone (DESYREL) 100 MG tablet Take 1 tablet (100 mg total) by mouth at bedtime. 06/09/21   Dettinger, Fransisca Kaufmann, MD  Turmeric (QC TUMERIC  COMPLEX PO) Take 1 capsule by mouth daily.    [provider]  vitamin B-12 (CYANOCOBALAMIN) 100 MCG tablet Take 100 mcg by mouth daily.    [provider]  vitamin C (ASCORBIC ACID) 500 MG tablet Take 500 mg by mouth daily.    [provider]  zinc gluconate 50 MG tablet Take 50 mg by mouth daily.    [provider]      Allergies    Latex    Review of Systems   Review of Systems  Constitutional:  Positive for fatigue. Negative for appetite change, chills and fever.  HENT:  Positive for congestion. Negative for ear pain and sore throat.   Respiratory:  Positive for cough. Negative for shortness of breath.   Cardiovascular:  Negative for chest pain.  Gastrointestinal:  Negative for abdominal pain, diarrhea, nausea and vomiting.  Genitourinary:  Negative for dysuria.  Musculoskeletal:  Positive for myalgias. Negative for neck pain and neck stiffness.  Skin:  Negative for rash.  Neurological:  Negative for dizziness, weakness and numbness.    Physical Exam Updated Vital Signs BP (!) 143/83 (BP Location: Right Arm)   Pulse (!) 104   Temp (!) 100.5 F (38.1 C) (Oral)   Resp 20   Ht 5' 1.5" (1.562 m)   Wt 102.1 kg   LMP  (LMP Unknown) Comment: FULL   SpO2 100%   BMI 41.82 kg/m  Physical Exam Vitals and nursing note reviewed.  Constitutional:      General: She is not in acute distress.    Appearance: Normal appearance. She is not ill-appearing or toxic-appearing.  HENT:     Right Ear: Tympanic membrane and ear canal normal.     Left Ear: Tympanic membrane and ear canal normal.     Mouth/Throat:     Mouth: Mucous membranes are moist.     Pharynx: No oropharyngeal exudate or posterior oropharyngeal erythema.  Eyes:     Conjunctiva/sclera: Conjunctivae normal.     Pupils: Pupils are equal, round, and reactive to light.  Cardiovascular:     Rate and Rhythm: Normal rate and regular rhythm.     Pulses: Normal pulses.  Pulmonary:     Effort:  Pulmonary effort is normal. No respiratory distress.     Breath sounds: Normal breath sounds. No wheezing.  Musculoskeletal:        General: Normal range of motion.     Cervical back: Normal range of motion. No rigidity.  Skin:    General: Skin is warm.     Capillary Refill: Capillary refill takes less than 2 seconds.  Neurological:     General: No focal deficit present.     Mental Status: She is alert.     Sensory: No sensory deficit.     Motor: No weakness.     ED Results / Procedures / Treatments   Labs (  all labs ordered are listed, but only abnormal results are displayed) Labs Reviewed  SARS CORONAVIRUS 2 BY RT PCR - Abnormal; Notable for the following components:      Result Value   SARS Coronavirus 2 by RT PCR POSITIVE (*)    All other components within normal limits    EKG None  Radiology No results found.  Procedures Procedures    Medications Ordered in ED Medications  acetaminophen (TYLENOL) tablet 650 mg (650 mg Oral Given 03/16/22 1240)    ED Course/ Medical Decision Making/ A&P                           Medical Decision Making Patient here for evaluation of generalized body aches, cough, congestion x1 day.  On exam, she is well-appearing nontoxic.  Low-grade fever at triage.  Mild tachycardia without tachypnea or hypoxia.  No shortness of breath or pleuritic chest pain  Differential would include but not limited to viral process, pneumonia, COVID, flu.  Her symptoms are highly suspicious of viral process.  Doubt PE or ACS.  Lungs are clear to auscultation and I have low clinical suspicion for pneumonia.  Will obtain COVID  testing.  Amount and/or Complexity of Data Reviewed Labs: ordered.    Details: COVID positive Radiology: ordered.    Details: Chest x-ray without acute cardiopulmonary findings Discussion of management or test interpretation with external provider(s): COVID test positive here, discussed findings with patient.  She has been  vaccinated.  Discussed treatment plan with antiviral.  She is agreeable to this plan.  She is within the window of antiviral treatment.  Prescription for Tessalon and albuterol MDI, agreeable to isolation precautions and close outpatient follow-up.  Strict return precautions were also given.  Risk OTC drugs. Prescription drug management.           Final Clinical Impression(s) / ED Diagnoses Final diagnoses:  COVID    Rx / DC Orders ED Discharge Orders          Ordered    benzonatate (TESSALON) 100 MG capsule  Every 8 hours        03/16/22 1538    albuterol (VENTOLIN HFA) 108 (90 Base) MCG/ACT inhaler  Every 6 hours PRN        03/16/22 1538    molnupiravir EUA (LAGEVRIO) 200 mg CAPS capsule  2 times daily        03/16/22 1538              Kenneisha Cochrane, [place], PA-C 03/19/22 1514    Wyvonnia Dusky, MD 03/19/22 [phone]

## 2022-03-26 ENCOUNTER — Other Ambulatory Visit: Payer: Self-pay | Admitting: Family Medicine

## 2022-03-26 DIAGNOSIS — J069 Acute upper respiratory infection, unspecified: Secondary | ICD-10-CM

## 2022-04-19 ENCOUNTER — Other Ambulatory Visit: Payer: Self-pay | Admitting: Family Medicine

## 2022-06-03 ENCOUNTER — Encounter: Payer: Self-pay | Admitting: Family Medicine

## 2022-06-03 ENCOUNTER — Telehealth (INDEPENDENT_AMBULATORY_CARE_PROVIDER_SITE_OTHER): Payer: 59 | Admitting: Family Medicine

## 2022-06-03 DIAGNOSIS — R3915 Urgency of urination: Secondary | ICD-10-CM | POA: Diagnosis not present

## 2022-06-03 DIAGNOSIS — J0110 Acute frontal sinusitis, unspecified: Secondary | ICD-10-CM | POA: Diagnosis not present

## 2022-06-03 MED ORDER — BENZONATATE 100 MG PO CAPS: 100.0000 mg | ORAL_CAPSULE | Freq: Three times a day (TID) | ORAL | 0 refills | Status: DC

## 2022-06-03 MED ORDER — MIRABEGRON ER 25 MG PO TB24: 25.0000 mg | ORAL_TABLET | Freq: Every day | ORAL | 5 refills | Status: DC

## 2022-06-03 MED ORDER — AMOXICILLIN-POT CLAVULANATE 875-125 MG PO TABS: 1.0000 | ORAL_TABLET | Freq: Two times a day (BID) | ORAL | 0 refills | Status: DC

## 2022-06-03 NOTE — Progress Notes (Signed)
Virtual Visit via mychart video Note  I connected with Destiny [name] on 06/03/22 at 0945 by video and verified that I am speaking with the correct person using two identifiers. Destiny [name] is currently located at home and patient are currently with her during visit. The provider, Fransisca Kaufmann Kyan Giannone, MD is located in their office at time of visit.  Call ended at [phone]  I discussed the limitations, risks, security and privacy concerns of performing an evaluation and management service by video and the availability of in person appointments. I also discussed with the patient that there may be a patient responsible charge related to this service. The patient expressed understanding and agreed to proceed.   History and Present Illness: Patient is calling in for cough and congestion.  She has nonproductive cough and has been going on for a week. She has had some wheezing and is using albuterol and it helps some. She is using nyquil and dayquil.  She denies fevers or chills.   1. Acute non-recurrent frontal sinusitis   2. Urinary urgency     Outpatient Encounter Medications as of 06/03/2022  Medication Sig   amoxicillin-clavulanate (AUGMENTIN) 875-125 MG tablet Take 1 tablet by mouth 2 (two) times daily.   mirabegron ER (MYRBETRIQ) 25 MG TB24 tablet Take 1 tablet (25 mg total) by mouth daily.   acetaminophen (TYLENOL) 500 MG tablet Take 1,000 mg by mouth every 8 (eight) hours as needed for moderate pain.   albuterol (VENTOLIN HFA) 108 (90 Base) MCG/ACT inhaler Inhale 1-2 puffs into the lungs every 6 (six) hours as needed for wheezing or shortness of breath.   azelastine (OPTIVAR) 0.05 % ophthalmic solution Apply 1 drop to eye 2 (two) times daily.   benzonatate (TESSALON) 100 MG capsule Take 1 capsule (100 mg total) by mouth every 8 (eight) hours.   Cholecalciferol (VITAMIN D3 PO) Take by mouth.   colestipol (COLESTID) 1 g tablet Take by mouth.   cyclobenzaprine (FLEXERIL) 10 MG tablet TAKE 1 TABLET  BY MOUTH AT BEDTIME AS NEEDED FOR MUSCLE SPASMS.   diclofenac (VOLTAREN) 75 MG EC tablet Take 1 tab twice daily for muscle and joint pain   dicyclomine (BENTYL) 20 MG tablet Take by mouth.   diphenoxylate-atropine (LOMOTIL) 2.5-0.025 MG tablet Take 2 tablets by mouth 4 (four) times daily as needed.   fluconazole (DIFLUCAN) 200 MG tablet Take 200 mg by mouth daily.   fluticasone (FLONASE) 50 MCG/ACT nasal spray PLACE 1 SPRAY INTO BOTH NOSTRILS 2 (TWO) TIMES DAILY   gabapentin (NEURONTIN) 300 MG capsule TAKE 1 CAPSULE BY MOUTH THREE TIMES A DAY   hydrochlorothiazide (HYDRODIURIL) 25 MG tablet Take 1 tablet (25 mg total) by mouth daily.   levothyroxine (SYNTHROID) 175 MCG tablet TAKE 1 TABLET BY MOUTH EVERY DAY   LORazepam (ATIVAN) 0.5 MG tablet TAKE 1 TABLET (0.5 MG) BY MOUTH AT BEDTIME AS NEEDED FOR ANXIETY   Multiple Vitamins-Minerals (MULTIVITAMIN WITH MINERALS) tablet Take 1 tablet by mouth daily.   naproxen sodium (ALEVE) 220 MG tablet Take by mouth.   ondansetron (ZOFRAN) 8 MG tablet Take 1 tablet by mouth every 8 hours as needed for nausea or vomiting.   promethazine-dextromethorphan (PROMETHAZINE-DM) 6.25-15 MG/5ML syrup Take 2.5 mLs by mouth 4 (four) times daily as needed for cough.   sertraline (ZOLOFT) 100 MG tablet TAKE 1 TABLET BY MOUTH EVERY DAY   traZODone (DESYREL) 100 MG tablet Take 1 tablet (100 mg total) by mouth at bedtime.   Turmeric (QC  TUMERIC COMPLEX PO) Take 1 capsule by mouth daily.   vitamin B-12 (CYANOCOBALAMIN) 100 MCG tablet Take 100 mcg by mouth daily.   vitamin C (ASCORBIC ACID) 500 MG tablet Take 500 mg by mouth daily.   zinc gluconate 50 MG tablet Take 50 mg by mouth daily.   [DISCONTINUED] benzonatate (TESSALON) 100 MG capsule Take 1 capsule (100 mg total) by mouth every 8 (eight) hours.   No facility-administered encounter medications on file as of 06/03/2022.    Review of Systems  Constitutional:  Negative for chills and fever.  HENT:  Positive for  congestion, postnasal drip, rhinorrhea and sinus pressure. Negative for ear discharge, ear pain, sneezing and sore throat.   Eyes:  Negative for pain, redness and visual disturbance.  Respiratory:  Positive for cough. Negative for chest tightness, shortness of breath and wheezing.   Cardiovascular:  Negative for chest pain and leg swelling.  Genitourinary:  Negative for difficulty urinating and dysuria.  Musculoskeletal:  Negative for back pain and gait problem.  Skin:  Negative for rash.  Neurological:  Negative for light-headedness and headaches.  Psychiatric/Behavioral:  Negative for agitation and behavioral problems.   All other systems reviewed and are negative.   Observations/Objective: No acute distress  Assessment and Plan: Problem List Items Addressed This Visit   None Visit Diagnoses     Acute non-recurrent frontal sinusitis    -  Primary   Relevant Medications   amoxicillin-clavulanate (AUGMENTIN) 875-125 MG tablet   benzonatate (TESSALON) 100 MG capsule   Urinary urgency            give amoxicillin and Tessalon Perles recommend she continue with the albuterol inhaler and DayQuil and NyQuil. Follow up plan: Return if symptoms worsen or fail to improve.     I discussed the assessment and treatment plan with the patient. The patient was provided an opportunity to ask questions and all were answered. The patient agreed with the plan and demonstrated an understanding of the instructions.   The patient was advised to call back or seek an in-person evaluation if the symptoms worsen or if the condition fails to improve as anticipated.  The above assessment and management plan was discussed with the patient. The patient verbalized understanding of and has agreed to the management plan. Patient is aware to call the clinic if symptoms persist or worsen. Patient is aware when to return to the clinic for a follow-up visit. Patient educated on when it is appropriate to go to the  emergency department.    I provided 9 minutes of non-face-to-face time during this encounter.    Worthy Rancher, MD

## 2022-06-08 ENCOUNTER — Telehealth: Payer: Self-pay | Admitting: Family Medicine

## 2022-06-08 ENCOUNTER — Ambulatory Visit (INDEPENDENT_AMBULATORY_CARE_PROVIDER_SITE_OTHER): Payer: Medicare Other

## 2022-06-08 VITALS — Ht 61.0 in | Wt 225.0 lb

## 2022-06-08 DIAGNOSIS — Z0000 Encounter for general adult medical examination without abnormal findings: Secondary | ICD-10-CM | POA: Diagnosis not present

## 2022-06-08 NOTE — Progress Notes (Signed)
Subjective:   Destiny [name] is a 57 y.o. female who presents for Medicare Annual (Subsequent) preventive examination. I connected with  [name] on 06/08/22 by a audio enabled telemedicine application and verified that I am speaking with the correct person using two identifiers.  Patient Location: Home  Provider Location: Home Office  I discussed the limitations of evaluation and management by telemedicine. The patient expressed understanding and agreed to proceed.  Review of Systems     Cardiac Risk Factors include: advanced age (>66mn, >>47women)     Objective:    Today's Vitals   06/08/22 0852  Weight: 225 lb (102.1 kg)  Height: '5\' 1"'$  (1.549 m)   Body mass index is 42.51 kg/m.     06/08/2022    8:56 AM 03/16/2022   12:32 PM 02/19/2022    1:15 PM 06/04/2021    9:19 AM 06/03/2020    8:56 AM 06/19/2019   11:50 AM 05/13/2017   12:42 PM  Advanced Directives  Does Patient Have a Medical Advance Directive? No No No No No No No  Would patient like information on creating a medical advance directive? No - Patient declined  No - Patient declined No - Patient declined Yes (MAU/Ambulatory/Procedural Areas - Information given) No - Patient declined No - Patient declined    Current Medications (verified) Outpatient Encounter Medications as of 06/08/2022  Medication Sig   acetaminophen (TYLENOL) 500 MG tablet Take 1,000 mg by mouth every 8 (eight) hours as needed for moderate pain.   albuterol (VENTOLIN HFA) 108 (90 Base) MCG/ACT inhaler Inhale 1-2 puffs into the lungs every 6 (six) hours as needed for wheezing or shortness of breath.   amoxicillin-clavulanate (AUGMENTIN) 875-125 MG tablet Take 1 tablet by mouth 2 (two) times daily.   azelastine (OPTIVAR) 0.05 % ophthalmic solution Apply 1 drop to eye 2 (two) times daily.   benzonatate (TESSALON) 100 MG capsule Take 1 capsule (100 mg total) by mouth every 8 (eight) hours.   Cholecalciferol (VITAMIN D3 PO) Take by mouth.   colestipol  (COLESTID) 1 g tablet Take by mouth.   cyclobenzaprine (FLEXERIL) 10 MG tablet TAKE 1 TABLET BY MOUTH AT BEDTIME AS NEEDED FOR MUSCLE SPASMS.   diclofenac (VOLTAREN) 75 MG EC tablet Take 1 tab twice daily for muscle and joint pain   diphenoxylate-atropine (LOMOTIL) 2.5-0.025 MG tablet Take 2 tablets by mouth 4 (four) times daily as needed.   fluconazole (DIFLUCAN) 200 MG tablet Take 200 mg by mouth daily.   fluticasone (FLONASE) 50 MCG/ACT nasal spray PLACE 1 SPRAY INTO BOTH NOSTRILS 2 (TWO) TIMES DAILY   gabapentin (NEURONTIN) 300 MG capsule TAKE 1 CAPSULE BY MOUTH THREE TIMES A DAY   hydrochlorothiazide (HYDRODIURIL) 25 MG tablet Take 1 tablet (25 mg total) by mouth daily.   levothyroxine (SYNTHROID) 175 MCG tablet TAKE 1 TABLET BY MOUTH EVERY DAY   LORazepam (ATIVAN) 0.5 MG tablet TAKE 1 TABLET (0.5 MG) BY MOUTH AT BEDTIME AS NEEDED FOR ANXIETY   mirabegron ER (MYRBETRIQ) 25 MG TB24 tablet Take 1 tablet (25 mg total) by mouth daily.   Multiple Vitamins-Minerals (MULTIVITAMIN WITH MINERALS) tablet Take 1 tablet by mouth daily.   naproxen sodium (ALEVE) 220 MG tablet Take by mouth.   ondansetron (ZOFRAN) 8 MG tablet Take 1 tablet by mouth every 8 hours as needed for nausea or vomiting.   promethazine-dextromethorphan (PROMETHAZINE-DM) 6.25-15 MG/5ML syrup Take 2.5 mLs by mouth 4 (four) times daily as needed for cough.  sertraline (ZOLOFT) 100 MG tablet TAKE 1 TABLET BY MOUTH EVERY DAY   traZODone (DESYREL) 100 MG tablet Take 1 tablet (100 mg total) by mouth at bedtime.   Turmeric (QC TUMERIC COMPLEX PO) Take 1 capsule by mouth daily.   vitamin B-12 (CYANOCOBALAMIN) 100 MCG tablet Take 100 mcg by mouth daily.   vitamin C (ASCORBIC ACID) 500 MG tablet Take 500 mg by mouth daily.   zinc gluconate 50 MG tablet Take 50 mg by mouth daily.   dicyclomine (BENTYL) 20 MG tablet Take by mouth.   No facility-administered encounter medications on file as of 06/08/2022.    Allergies (verified) Latex    History: Past Medical History:  Diagnosis Date   Chronic back pain    Depression    Hypertension    Hyperthyroidism    Rectal cancer ([place]) 10/29/2015   rectal invasice adenocarcinoma,Adenocarcinoma dx 10/29/15; Stage III, T3, N1, MO   Past Surgical History:  Procedure Laterality Date   ABDOMINAL HYSTERECTOMY     vaginal    BLADDER SURGERY     EYE SURGERY     LAZY EYE CORRECTION   ILEOSTOMY     ILEOSTOMY CLOSURE N/A 09/02/2016   Procedure: ILEOSTOMY REVERSAL;  Surgeon: Leighton Ruff, MD;  Location: [facility];  Service: General;  Laterality: N/A;   RECTAL EXAM UNDER ANESTHESIA N/A 09/02/2016   Procedure: ANAL EXAM UNDER ANESTHESIA;  Surgeon: Leighton Ruff, MD;  Location: [facility];  Service: General;  Laterality: N/A;   URETERAL REIMPLANTION Left 02/26/2016   Procedure: left ureteral neocystotomy,double J stent placement, closure vaginal cuff;  Surgeon: Ardis Hughs, MD;  Location: [facility];  Service: Urology;  Laterality: Left;   VAGINAL HYSTERECTOMY     XI ROBOTIC ASSISTED LOWER ANTERIOR RESECTION N/A 02/26/2016   Procedure: XI ROBOTIC ASSISTED LOWER ANTERIOR RESECTION, diverting LOOP ILEOSTOMY, mobilization splenic flexure, omentopexy;  Surgeon: Leighton Ruff, MD;  Location: [facility];  Service: General;  Laterality: N/A;   Family History  Problem Relation Age of Onset   Thyroid disease Mother    Arthritis Mother    Lung cancer Father        lung   Migraines Sister    Depression Sister    Migraines Daughter    Cirrhosis Brother    Breast cancer Neg Hx    Social History   Socioeconomic History   Marital status: Divorced    Spouse name: Not on file   Number of children: 3   Years of education: Not on file   Highest education level: Some college, no degree  Occupational History   Occupation: Disabled  Tobacco Use   Smoking status: Never   Smokeless tobacco: Never  Vaping Use   Vaping Use: Never used  Substance and Sexual Activity   Alcohol use: No    Alcohol/week: 0.0  standard drinks of alcohol    Comment: occ   Drug use: No   Sexual activity: Yes    Birth control/protection: Surgical  Other Topics Concern   Not on file  Social History Narrative   She is on disability.   Grandson lives with her.   Social Determinants of Health   Financial Resource Strain: Low Risk  (06/08/2022)   Overall Financial Resource Strain (CARDIA)    Difficulty of Paying Living Expenses: Not hard at all  Food Insecurity: No Food Insecurity (06/08/2022)   Hunger Vital Sign    Worried About Running Out of Food in the Last Year: Never true    Ran  Out of Food in the Last Year: Never true  Transportation Needs: No Transportation Needs (06/08/2022)   PRAPARE - Hydrologist (Medical): No    Lack of Transportation (Non-Medical): No  Physical Activity: Inactive (06/08/2022)   Exercise Vital Sign    Days of Exercise per Week: 0 days    Minutes of Exercise per Session: 0 min  Stress: No Stress Concern Present (06/08/2022)   [place]    Feeling of Stress : Not at all  Social Connections: Moderately Integrated (06/08/2022)   Social Connection and Isolation Panel [NHANES]    Frequency of Communication with Friends and Family: More than three times a week    Frequency of Social Gatherings with Friends and Family: More than three times a week    Attends Religious Services: More than 4 times per year    Active Member of [employer] or Organizations: Yes    Attends Music therapist: More than 4 times per year    Marital Status: Divorced    Tobacco Counseling Counseling given: Not Answered   Clinical Intake:  Pre-visit preparation completed: Yes  Pain : No/denies pain     Nutritional Risks: None Diabetes: No  How often do you need to have someone help you when you read instructions, pamphlets, or other written materials from your doctor or pharmacy?: 1 - Never  Diabetic?no    Interpreter Needed?: No  Information entered by :: Jadene Pierini, LPN   Activities of Daily Living    06/08/2022    8:56 AM  In your present state of health, do you have any difficulty performing the following activities:  Hearing? 0  Vision? 0  Difficulty concentrating or making decisions? 0  Walking or climbing stairs? 0  Dressing or bathing? 0  Doing errands, shopping? 0  Preparing Food and eating ? N  Using the Toilet? N  In the past six months, have you accidently leaked urine? N  Do you have problems with loss of bowel control? N  Managing your Medications? N  Managing your Finances? N  Housekeeping or managing your Housekeeping? N    Patient Care Team: Dettinger, Fransisca Kaufmann, MD as PCP - General (Family Medicine) Patwa, Elta Guadeloupe, MD as Referring Physician (Gastroenterology) Ladell Pier, MD as Consulting Physician (Oncology) Harlen Labs, MD as Referring Physician (Optometry)  Indicate any recent Medical Services you may have received from other than [facility] providers in the past year (date may be approximate).     Assessment:   This is a routine wellness examination for [facility].  Hearing/Vision screen Vision Screening - Comments:: Wears rx glasses - up to date with routine eye exams with  Dr.lee  Dietary issues and exercise activities discussed: Current Exercise Habits: The patient does not participate in regular exercise at present, Exercise limited by: None identified   Goals Addressed             This Visit's Progress    DIET - INCREASE WATER INTAKE         Depression Screen    06/08/2022    8:54 AM 06/09/2021    2:36 PM 06/04/2021    9:16 AM 07/15/2020   10:59 AM 06/03/2020    8:53 AM 03/22/2020    9:23 AM 10/25/2019    4:17 PM  PHQ 2/9 Scores  PHQ - 2 Score 0 '4 2 2 2 6 6  '$ PHQ- 9 Score  14  $'6  7 15 10    'H$ Fall Risk    06/08/2022    8:53 AM 11/27/2021    1:24 PM 06/09/2021    2:36 PM 06/04/2021   10:25 AM 10/04/2020    2:42 PM  Fall Risk   Falls in  the past year? 0 0 0 0 0  Number falls in past yr: 0   0   Injury with Fall? 0   0   Risk for fall due to : No Fall Risks   No Fall Risks   Follow up Falls prevention discussed   Falls prevention discussed     FALL RISK PREVENTION PERTAINING TO THE HOME:  Any stairs in or around the home? No  If so, are there any without handrails? No  Home free of loose throw rugs in walkways, pet beds, electrical cords, etc? Yes  Adequate lighting in your home to reduce risk of falls? Yes   ASSISTIVE DEVICES UTILIZED TO PREVENT FALLS:  Life alert? No  Use of a cane, walker or w/c? No  Grab bars in the bathroom? No  Shower chair or bench in shower? No  Elevated toilet seat or a handicapped toilet? No          06/08/2022    8:56 AM 06/03/2020    8:57 AM  6CIT Screen  What Year? 0 points 0 points  What month? 0 points 0 points  What time? 0 points 0 points  Count back from 20 0 points 0 points  Months in reverse 0 points 0 points  Repeat phrase 0 points 0 points  Total Score 0 points 0 points    Immunizations Immunization History  Administered Date(s) Administered   Hepatitis A 08/08/2007, 10/28/2007, 06/06/2009   Hepatitis A, Adult 08/08/2007, 10/28/2007, 06/06/2009   Hepatitis B 08/08/2007, 10/28/2007, 06/06/2009   Influenza,inj,Quad PF,6+ Mos 02/19/2016, 05/07/2017, 03/18/2018, 04/07/2019, 03/22/2020   Influenza-Unspecified 03/18/2018   MMR 01/14/2001   Moderna Sars-Covid-2 Vaccination 07/04/2019, 08/01/2019, 04/23/2020   Td 01/14/2001, 11/02/2013   Td (Adult), 2 Lf Tetanus Toxid, Preservative Free 01/14/2001, 11/02/2013   Tdap 11/02/2013   Zoster Recombinat (Shingrix) 01/24/2020, 04/05/2020    TDAP status: Up to date  Flu Vaccine status: Up to date  Pneumococcal vaccine status: Due, Education has been provided regarding the importance of this vaccine. Advised may receive this vaccine at local pharmacy or Health Dept. Aware to provide a copy of the vaccination record if  obtained from local pharmacy or Health Dept. Verbalized acceptance and understanding.  Covid-19 vaccine status: Completed vaccines  Qualifies for Shingles Vaccine? Yes   Zostavax completed Yes   Shingrix Completed?: Yes  Screening Tests Health Maintenance  Topic Date Due   COVID-19 Vaccine (4 - 2023-24 season) 01/30/2022   PAP SMEAR-Modifier  01/31/2022   INFLUENZA VACCINE  08/30/2022 (Originally 12/30/2021)   COLONOSCOPY (Pts 45-78yr Insurance coverage will need to be confirmed)  08/17/2022   MAMMOGRAM  12/04/2022   Medicare Annual Wellness (AWV)  06/09/2023   DTaP/Tdap/Td (5 - Td or Tdap) 11/03/2023   Hepatitis C Screening  Completed   HIV Screening  Completed   Zoster Vaccines- Shingrix  Completed   HPV VACCINES  Aged Out    Health Maintenance  Health Maintenance Due  Topic Date Due   COVID-19 Vaccine (4 - 2023-24 season) 01/30/2022   PAP SMEAR-Modifier  01/31/2022    Colorectal cancer screening: Type of screening: Colonoscopy. Completed 08/14/2019. Repeat every 3 years  Mammogram status: Completed 12/03/2021. Repeat every year  Bone Density status: Ordered not of age . Pt provided with contact info and advised to call to schedule appt.  Lung Cancer Screening: (Low Dose CT Chest recommended if Age 76-80 years, 30 pack-year currently smoking OR have quit w/in 15years.) does not qualify.   Lung Cancer Screening Referral: n/a  Additional Screening:  Hepatitis C Screening: does not qualify;   Vision Screening: Recommended annual ophthalmology exams for early detection of glaucoma and other disorders of the eye. Is the patient up to date with their annual eye exam?  Yes  Who is the provider or what is the name of the office in which the patient attends annual eye exams? Dr.Lee  If pt is not established with a provider, would they like to be referred to a provider to establish care? No .   Dental Screening: Recommended annual dental exams for proper oral  hygiene  Community Resource Referral / Chronic Care Management: CRR required this visit?  No   CCM required this visit?  No      Plan:     I have personally reviewed and noted the following in the patient's chart:   Medical and social history Use of alcohol, tobacco or illicit drugs  Current medications and supplements including opioid prescriptions. Patient is not currently taking opioid prescriptions. Functional ability and status Nutritional status Physical activity Advanced directives List of other physicians Hospitalizations, surgeries, and ER visits in previous 12 months Vitals Screenings to include cognitive, depression, and falls Referrals and appointments  In addition, I have reviewed and discussed with patient certain preventive protocols, quality metrics, and best practice recommendations. A written personalized care plan for preventive services as well as general preventive health recommendations were provided to patient.     Daphane Shepherd, LPN   0/01/8118   Nurse Notes: Due Pneumonia Vaccine

## 2022-06-08 NOTE — Patient Instructions (Signed)
Destiny [name] , Thank you for taking time to come for your Medicare Wellness Visit. I appreciate your ongoing commitment to your health goals. Please review the following plan we discussed and let me know if I can assist you in the future.   These are the goals we discussed:  Goals      AWV     06/03/2020 AWV Goal: Exercise for General Health  Patient will verbalize understanding of the benefits of increased physical activity: Exercising regularly is important. It will improve your overall fitness, flexibility, and endurance. Regular exercise also will improve your overall health. It can help you control your weight, reduce stress, and improve your bone density. Over the next year, patient will increase physical activity as tolerated with a goal of at least 150 minutes of moderate physical activity per week.  You can tell that you are exercising at a moderate intensity if your heart starts beating faster and you start breathing faster but can still hold a conversation. Moderate-intensity exercise ideas include: Walking 1 mile (1.6 km) in about 15 minutes Biking Hiking Golfing Dancing Water aerobics Patient will verbalize understanding of everyday activities that increase physical activity by providing examples like the following: Yard work, such as: Sales promotion account executive Gardening Washing windows or floors Patient will be able to explain general safety guidelines for exercising:  Before you start a new exercise program, talk with your health care provider. Do not exercise so much that you hurt yourself, feel dizzy, or get very short of breath. Wear comfortable clothes and wear shoes with good support. Drink plenty of water while you exercise to prevent dehydration or heat stroke. Work out until your breathing and your heartbeat get faster.      DIET - INCREASE WATER INTAKE        This is a list of the  screening recommended for you and due dates:  Health Maintenance  Topic Date Due   COVID-19 Vaccine (4 - 2023-24 season) 01/30/2022   Pap Smear  01/31/2022   Flu Shot  08/30/2022*   Colon Cancer Screening  08/17/2022   Mammogram  12/04/2022   Medicare Annual Wellness Visit  06/09/2023   DTaP/Tdap/Td vaccine (5 - Td or Tdap) 11/03/2023   Hepatitis C Screening: USPSTF Recommendation to screen - Ages 76-79 yo.  Completed   HIV Screening  Completed   Zoster (Shingles) Vaccine  Completed   HPV Vaccine  Aged Out  *Topic was postponed. The date shown is not the original due date.    Advanced directives: Advance directive discussed with you today. I have provided a copy for you to complete at home and have notarized. Once this is complete please bring a copy in to our office so we can scan it into your chart.   Conditions/risks identified: Aim for 30 minutes of exercise or brisk walking, 6-8 glasses of water, and 5 servings of fruits and vegetables each day.   Next appointment: Follow up in one year for your annual wellness visit.   Preventive Care 40-64 Years, Female Preventive care refers to lifestyle choices and visits with your health care provider that can promote health and wellness. What does preventive care include? A yearly physical exam. This is also called an annual well check. Dental exams once or twice a year. Routine eye exams. Ask your health care provider how often you should have your eyes checked. Personal lifestyle choices, including: Daily care  of your teeth and gums. Regular physical activity. Eating a healthy diet. Avoiding tobacco and drug use. Limiting alcohol use. Practicing safe sex. Taking low-dose aspirin daily starting at age 47. Taking vitamin and mineral supplements as recommended by your health care provider. What happens during an annual well check? The services and screenings done by your health care provider during your annual well check will depend  on your age, overall health, lifestyle risk factors, and family history of disease. Counseling  Your health care provider may ask you questions about your: Alcohol use. Tobacco use. Drug use. Emotional well-being. Home and relationship well-being. Sexual activity. Eating habits. Work and work Statistician. Method of birth control. Menstrual cycle. Pregnancy history. Screening  You may have the following tests or measurements: Height, weight, and BMI. Blood pressure. Lipid and cholesterol levels. These may be checked every 5 years, or more frequently if you are over 95 years old. Skin check. Lung cancer screening. You may have this screening every year starting at age 51 if you have a 30-pack-year history of smoking and currently smoke or have quit within the past 15 years. Fecal occult blood test (FOBT) of the stool. You may have this test every year starting at age 57. Flexible sigmoidoscopy or colonoscopy. You may have a sigmoidoscopy every 5 years or a colonoscopy every 10 years starting at age 70. Hepatitis C blood test. Hepatitis B blood test. Sexually transmitted disease (STD) testing. Diabetes screening. This is done by checking your blood sugar (glucose) after you have not eaten for a while (fasting). You may have this done every 1-3 years. Mammogram. This may be done every 1-2 years. Talk to your health care provider about when you should start having regular mammograms. This may depend on whether you have a family history of breast cancer. BRCA-related cancer screening. This may be done if you have a family history of breast, ovarian, tubal, or peritoneal cancers. Pelvic exam and Pap test. This may be done every 3 years starting at age 55. Starting at age 81, this may be done every 5 years if you have a Pap test in combination with an HPV test. Bone density scan. This is done to screen for osteoporosis. You may have this scan if you are at high risk for osteoporosis. Discuss  your test results, treatment options, and if necessary, the need for more tests with your health care provider. Vaccines  Your health care provider may recommend certain vaccines, such as: Influenza vaccine. This is recommended every year. Tetanus, diphtheria, and acellular pertussis (Tdap, Td) vaccine. You may need a Td booster every 10 years. Zoster vaccine. You may need this after age 20. Pneumococcal 13-valent conjugate (PCV13) vaccine. You may need this if you have certain conditions and were not previously vaccinated. Pneumococcal polysaccharide (PPSV23) vaccine. You may need one or two doses if you smoke cigarettes or if you have certain conditions. Talk to your health care provider about which screenings and vaccines you need and how often you need them. This information is not intended to replace advice given to you by your health care provider. Make sure you discuss any questions you have with your health care provider. Document Released: 06/14/2015 Document Revised: 02/05/2016 Document Reviewed: 03/19/2015 Elsevier Interactive Patient Education  2017 [employer] Prevention in the Home Falls can cause injuries. They can happen to people of all ages. There are many things you can do to make your home safe and to help prevent falls. What  can I do on the outside of my home? Regularly fix the edges of walkways and driveways and fix any cracks. Remove anything that might make you trip as you walk through a door, such as a raised step or threshold. Trim any bushes or trees on the path to your home. Use bright outdoor lighting. Clear any walking paths of anything that might make someone trip, such as rocks or tools. Regularly check to see if handrails are loose or broken. Make sure that both sides of any steps have handrails. Any raised decks and porches should have guardrails on the edges. Have any leaves, snow, or ice cleared regularly. Use sand or salt on walking paths  during winter. Clean up any spills in your garage right away. This includes oil or grease spills. What can I do in the bathroom? Use night lights. Install grab bars by the toilet and in the tub and shower. Do not use towel bars as grab bars. Use non-skid mats or decals in the tub or shower. If you need to sit down in the shower, use a plastic, non-slip stool. Keep the floor dry. Clean up any water that spills on the floor as soon as it happens. Remove soap buildup in the tub or shower regularly. Attach bath mats securely with double-sided non-slip rug tape. Do not have throw rugs and other things on the floor that can make you trip. What can I do in the bedroom? Use night lights. Make sure that you have a light by your bed that is easy to reach. Do not use any sheets or blankets that are too big for your bed. They should not hang down onto the floor. Have a firm chair that has side arms. You can use this for support while you get dressed. Do not have throw rugs and other things on the floor that can make you trip. What can I do in the kitchen? Clean up any spills right away. Avoid walking on wet floors. Keep items that you use a lot in easy-to-reach places. If you need to reach something above you, use a strong step stool that has a grab bar. Keep electrical cords out of the way. Do not use floor polish or wax that makes floors slippery. If you must use wax, use non-skid floor wax. Do not have throw rugs and other things on the floor that can make you trip. What can I do with my stairs? Do not leave any items on the stairs. Make sure that there are handrails on both sides of the stairs and use them. Fix handrails that are broken or loose. Make sure that handrails are as long as the stairways. Check any carpeting to make sure that it is firmly attached to the stairs. Fix any carpet that is loose or worn. Avoid having throw rugs at the top or bottom of the stairs. If you do have throw  rugs, attach them to the floor with carpet tape. Make sure that you have a light switch at the top of the stairs and the bottom of the stairs. If you do not have them, ask someone to add them for you. What else can I do to help prevent falls? Wear shoes that: Do not have high heels. Have rubber bottoms. Are comfortable and fit you well. Are closed at the toe. Do not wear sandals. If you use a stepladder: Make sure that it is fully opened. Do not climb a closed stepladder. Make sure that both sides of  the stepladder are locked into place. Ask someone to hold it for you, if possible. Clearly mark and make sure that you can see: Any grab bars or handrails. First and last steps. Where the edge of each step is. Use tools that help you move around (mobility aids) if they are needed. These include: Canes. Walkers. Scooters. Crutches. Turn on the lights when you go into a dark area. Replace any light bulbs as soon as they burn out. Set up your furniture so you have a clear path. Avoid moving your furniture around. If any of your floors are uneven, fix them. If there are any pets around you, be aware of where they are. Review your medicines with your doctor. Some medicines can make you feel dizzy. This can increase your chance of falling. Ask your doctor what other things that you can do to help prevent falls. This information is not intended to replace advice given to you by your health care provider. Make sure you discuss any questions you have with your health care provider. Document Released: 03/14/2009 Document Revised: 10/24/2015 Document Reviewed: 06/22/2014 Elsevier Interactive Patient Education  2017 [employer].

## 2022-06-11 ENCOUNTER — Other Ambulatory Visit: Payer: Self-pay | Admitting: Family Medicine

## 2022-06-20 ENCOUNTER — Other Ambulatory Visit: Payer: Self-pay | Admitting: Family Medicine

## 2022-06-20 DIAGNOSIS — F331 Major depressive disorder, recurrent, moderate: Secondary | ICD-10-CM

## 2022-07-19 ENCOUNTER — Other Ambulatory Visit: Payer: Self-pay | Admitting: Family Medicine

## 2022-07-20 NOTE — Telephone Encounter (Signed)
Last OV 06/03/22. Last RF 04/20/22 # 30 no refills . Next OV no appt scheduled at this time

## 2022-07-30 DIAGNOSIS — R531 Weakness: Secondary | ICD-10-CM | POA: Diagnosis not present

## 2022-07-30 DIAGNOSIS — Z20822 Contact with and (suspected) exposure to covid-19: Secondary | ICD-10-CM | POA: Diagnosis not present

## 2022-07-30 DIAGNOSIS — J209 Acute bronchitis, unspecified: Secondary | ICD-10-CM | POA: Diagnosis not present

## 2022-08-10 DIAGNOSIS — N159 Renal tubulo-interstitial disease, unspecified: Secondary | ICD-10-CM | POA: Diagnosis not present

## 2022-08-10 DIAGNOSIS — R350 Frequency of micturition: Secondary | ICD-10-CM | POA: Diagnosis not present

## 2022-08-26 DIAGNOSIS — D123 Benign neoplasm of transverse colon: Secondary | ICD-10-CM | POA: Diagnosis not present

## 2022-08-26 DIAGNOSIS — Z85048 Personal history of other malignant neoplasm of rectum, rectosigmoid junction, and anus: Secondary | ICD-10-CM | POA: Diagnosis not present

## 2022-08-26 DIAGNOSIS — Z09 Encounter for follow-up examination after completed treatment for conditions other than malignant neoplasm: Secondary | ICD-10-CM | POA: Diagnosis not present

## 2022-08-26 DIAGNOSIS — Z86010 Personal history of colonic polyps: Secondary | ICD-10-CM | POA: Diagnosis not present

## 2022-08-26 DIAGNOSIS — D120 Benign neoplasm of cecum: Secondary | ICD-10-CM | POA: Diagnosis not present

## 2022-08-26 DIAGNOSIS — Z08 Encounter for follow-up examination after completed treatment for malignant neoplasm: Secondary | ICD-10-CM | POA: Diagnosis not present

## 2022-08-26 DIAGNOSIS — D122 Benign neoplasm of ascending colon: Secondary | ICD-10-CM | POA: Diagnosis not present

## 2022-09-01 ENCOUNTER — Other Ambulatory Visit: Payer: Self-pay | Admitting: Family Medicine

## 2022-09-01 DIAGNOSIS — F331 Major depressive disorder, recurrent, moderate: Secondary | ICD-10-CM

## 2022-09-25 ENCOUNTER — Other Ambulatory Visit: Payer: Self-pay | Admitting: Family Medicine

## 2022-09-25 DIAGNOSIS — F331 Major depressive disorder, recurrent, moderate: Secondary | ICD-10-CM

## 2022-09-29 ENCOUNTER — Other Ambulatory Visit: Payer: Self-pay | Admitting: Family Medicine

## 2022-09-29 DIAGNOSIS — F331 Major depressive disorder, recurrent, moderate: Secondary | ICD-10-CM

## 2022-09-29 MED ORDER — SERTRALINE HCL 100 MG PO TABS: 100.0000 mg | ORAL_TABLET | Freq: Every day | ORAL | 0 refills | Status: DC

## 2022-09-29 NOTE — Telephone Encounter (Signed)
Dettinger pt NTBS 30-d given 09/01/22

## 2022-09-29 NOTE — Telephone Encounter (Signed)
Route back to RX pool

## 2022-09-29 NOTE — Addendum Note (Signed)
Addended by: Julious Payer D on: 09/29/2022 01:21 PM   Modules accepted: Orders

## 2022-09-29 NOTE — Telephone Encounter (Signed)
Pt scheduled next available with Dettinger 12/02/2022

## 2022-10-13 ENCOUNTER — Other Ambulatory Visit: Payer: Self-pay | Admitting: Family Medicine

## 2022-10-13 NOTE — Telephone Encounter (Signed)
Last OV 11/27/21. Last RF 07/10/22 with 2 refills. Next OV 11/2022

## 2022-11-25 ENCOUNTER — Other Ambulatory Visit: Payer: Self-pay | Admitting: Family Medicine

## 2022-11-25 DIAGNOSIS — J0110 Acute frontal sinusitis, unspecified: Secondary | ICD-10-CM

## 2022-12-02 ENCOUNTER — Encounter: Payer: Self-pay | Admitting: Family Medicine

## 2022-12-02 ENCOUNTER — Ambulatory Visit (INDEPENDENT_AMBULATORY_CARE_PROVIDER_SITE_OTHER): Payer: 59 | Admitting: Family Medicine

## 2022-12-02 VITALS — BP 129/81 | HR 85 | Ht 61.0 in | Wt 229.0 lb

## 2022-12-02 DIAGNOSIS — E890 Postprocedural hypothyroidism: Secondary | ICD-10-CM

## 2022-12-02 DIAGNOSIS — I10 Essential (primary) hypertension: Secondary | ICD-10-CM

## 2022-12-02 DIAGNOSIS — Z79891 Long term (current) use of opiate analgesic: Secondary | ICD-10-CM | POA: Diagnosis not present

## 2022-12-02 DIAGNOSIS — F331 Major depressive disorder, recurrent, moderate: Secondary | ICD-10-CM

## 2022-12-02 MED ORDER — GABAPENTIN 300 MG PO CAPS: ORAL_CAPSULE | ORAL | 3 refills | Status: DC

## 2022-12-02 MED ORDER — SERTRALINE HCL 100 MG PO TABS: 150.0000 mg | ORAL_TABLET | Freq: Every day | ORAL | 3 refills | Status: DC

## 2022-12-02 MED ORDER — SERTRALINE HCL 100 MG PO TABS: 100.0000 mg | ORAL_TABLET | Freq: Every day | ORAL | 3 refills | Status: DC

## 2022-12-02 MED ORDER — LORAZEPAM 0.5 MG PO TABS: ORAL_TABLET | ORAL | 5 refills | Status: DC

## 2022-12-02 MED ORDER — HYDROCHLOROTHIAZIDE 25 MG PO TABS: 25.0000 mg | ORAL_TABLET | Freq: Every day | ORAL | 3 refills | Status: DC

## 2022-12-02 NOTE — Progress Notes (Signed)
BP 129/81   Pulse 85   Ht 5\' 1"  (1.549 m)   Wt 229 lb (103.9 kg)   LMP  (LMP Unknown) Comment: FULL   SpO2 93%   BMI 43.27 kg/m    Subjective:   Patient ID: Destiny [name], female    DOB: 09-Nov-1965, 57 y.o.   MRN: [id number]  HPI: Destiny [name] is a 57 y.o. female presenting on 12/02/2022 for Medical Management of Chronic Issues, Back Pain (Requesting injection), and Fall (RLE edema with small laceration)   HPI Depression recheck Today for depression recheck.  She says she is not doing well with her depression.  She says that she has been doing well since May 17, 2023 when her mother passed away and she just feeling sad.  She says she did try to see her pastor for a little while for counseling but is not currently seeing anybody anymore but she just feels sad all the time and feels like the Zoloft and lorazepam were not helping like they were before.  She denies any suicidal ideations or thoughts of hurting herself.    12/02/2022    2:29 PM 06/08/2022    8:54 AM 06/09/2021    2:36 PM 06/04/2021    9:16 AM 07/15/2020   10:59 AM  Depression screen PHQ 2/9  Decreased Interest 3 0 2 1 1   Down, Depressed, Hopeless 3 0 2 1 1   PHQ - 2 Score 6 0 4 2 2   Altered sleeping 2  2 1    Tired, decreased energy 3  3 1    Change in appetite 1  1 0   Feeling bad or failure about yourself  2  2 1    Trouble concentrating 2  2 1    Moving slowly or fidgety/restless 1  0 0   Suicidal thoughts 0  0 0   PHQ-9 Score 17  14 6    Difficult doing work/chores Very difficult   Somewhat difficult     Hypertension Patient is currently on hydrochlorothiazide, and their blood pressure today is 129/81. Patient denies any lightheadedness or dizziness. Patient denies headaches, blurred vision, chest pains, shortness of breath, or weakness. Denies any side effects from medication and is content with current medication.   Hypothyroidism recheck Patient is coming in for thyroid recheck today as well. They deny any issues with hair  changes or heat or cold problems or diarrhea or constipation. They deny any chest pain or palpitations. They are currently on levothyroxine 175 micrograms   Relevant past medical, surgical, family and social history reviewed and updated as indicated. Interim medical history since our last visit reviewed. Allergies and medications reviewed and updated.  Review of Systems  Constitutional:  Negative for chills and fever.  Eyes:  Negative for visual disturbance.  Respiratory:  Negative for chest tightness and shortness of breath.   Cardiovascular:  Negative for chest pain and leg swelling.  Genitourinary:  Negative for difficulty urinating and dysuria.  Musculoskeletal:  Negative for back pain and gait problem.  Skin:  Negative for rash.  Neurological:  Negative for dizziness, light-headedness and headaches.  Psychiatric/Behavioral:  Negative for agitation and behavioral problems.   All other systems reviewed and are negative.   Per HPI unless specifically indicated above   Allergies as of 12/02/2022       Reactions   Latex Itching, Rash        Medication List        Accurate as of December 02, 2022  2:59 PM.  If you have any questions, ask your nurse or doctor.          STOP taking these medications    amoxicillin-clavulanate 875-125 MG tablet Commonly known as: AUGMENTIN Stopped by: Elige Radon Peggye Poon, MD   benzonatate 100 MG capsule Commonly known as: TESSALON Stopped by: Nils Pyle, MD   promethazine-dextromethorphan 6.25-15 MG/5ML syrup Commonly known as: PROMETHAZINE-DM Stopped by: Nils Pyle, MD       TAKE these medications    acetaminophen 500 MG tablet Commonly known as: TYLENOL Take 1,000 mg by mouth every 8 (eight) hours as needed for moderate pain.   albuterol 108 (90 Base) MCG/ACT inhaler Commonly known as: VENTOLIN HFA Inhale 1-2 puffs into the lungs every 6 (six) hours as needed for wheezing or shortness of breath.   ascorbic acid  500 MG tablet Commonly known as: VITAMIN C Take 500 mg by mouth daily.   azelastine 0.05 % ophthalmic solution Commonly known as: OPTIVAR Apply 1 drop to eye 2 (two) times daily.   colestipol 1 g tablet Commonly known as: COLESTID Take by mouth.   cyclobenzaprine 10 MG tablet Commonly known as: FLEXERIL TAKE 1 TABLET BY MOUTH AT BEDTIME AS NEEDED FOR MUSCLE SPASMS.   diclofenac 75 MG EC tablet Commonly known as: VOLTAREN Take 1 tab twice daily for muscle and joint pain   dicyclomine 20 MG tablet Commonly known as: BENTYL Take by mouth.   diphenoxylate-atropine 2.5-0.025 MG tablet Commonly known as: LOMOTIL Take 2 tablets by mouth 4 (four) times daily as needed.   fluconazole 200 MG tablet Commonly known as: DIFLUCAN Take 200 mg by mouth daily.   fluticasone 50 MCG/ACT nasal spray Commonly known as: FLONASE PLACE 1 SPRAY INTO BOTH NOSTRILS 2 (TWO) TIMES DAILY   gabapentin 300 MG capsule Commonly known as: NEURONTIN TAKE 1 CAPSULE BY MOUTH THREE TIMES A DAY   hydrochlorothiazide 25 MG tablet Commonly known as: HYDRODIURIL Take 1 tablet (25 mg total) by mouth daily.   levothyroxine 175 MCG tablet Commonly known as: SYNTHROID TAKE 1 TABLET BY MOUTH EVERY DAY   LORazepam 0.5 MG tablet Commonly known as: ATIVAN TAKE 1 TABLET (0.5 MG) BY MOUTH AT BEDTIME AS NEEDED FOR ANXIETY   mirabegron ER 25 MG Tb24 tablet Commonly known as: Myrbetriq Take 1 tablet (25 mg total) by mouth daily.   multivitamin with minerals tablet Take 1 tablet by mouth daily.   naproxen sodium 220 MG tablet Commonly known as: ALEVE Take by mouth.   ondansetron 8 MG tablet Commonly known as: ZOFRAN Take 1 tablet by mouth every 8 hours as needed for nausea or vomiting.   QC TUMERIC COMPLEX PO Take 1 capsule by mouth daily.   sertraline 100 MG tablet Commonly known as: ZOLOFT Take 1.5 tablets (150 mg total) by mouth daily. What changed: how much to take Changed by: Nils Pyle, MD   traZODone 100 MG tablet Commonly known as: DESYREL Take 1 tablet (100 mg total) by mouth at bedtime.   vitamin B-12 100 MCG tablet Commonly known as: CYANOCOBALAMIN Take 100 mcg by mouth daily.   VITAMIN D3 PO Take by mouth.   zinc gluconate 50 MG tablet Take 50 mg by mouth daily.         Objective:   BP 129/81   Pulse 85   Ht 5\' 1"  (1.549 m)   Wt 229 lb (103.9 kg)   LMP  (LMP Unknown) Comment: FULL   SpO2 93%   BMI  43.27 kg/m   Wt Readings from Last 3 Encounters:  12/02/22 229 lb (103.9 kg)  06/08/22 225 lb (102.1 kg)  03/16/22 225 lb (102.1 kg)    Physical Exam Vitals and nursing note reviewed.  Constitutional:      General: She is not in acute distress.    Appearance: She is well-developed. She is not diaphoretic.  Eyes:     Conjunctiva/sclera: Conjunctivae normal.  Cardiovascular:     Rate and Rhythm: Normal rate and regular rhythm.     Heart sounds: Normal heart sounds. No murmur heard. Pulmonary:     Effort: Pulmonary effort is normal. No respiratory distress.     Breath sounds: Normal breath sounds. No wheezing.  Musculoskeletal:        General: Swelling (1+ edema in right lower extremity) present. No tenderness. Normal range of motion.  Skin:    General: Skin is warm and dry.     Findings: No rash.  Neurological:     Mental Status: She is alert and oriented to person, place, and time.     Coordination: Coordination normal.  Psychiatric:        Behavior: Behavior normal.       Assessment & Plan:   Problem List Items Addressed This Visit       Cardiovascular and Mediastinum   Essential hypertension, benign   Relevant Medications   hydrochlorothiazide (HYDRODIURIL) 25 MG tablet   Other Relevant Orders   CBC with Differential/Platelet   CMP14+EGFR   Lipid panel     Endocrine   Hypothyroidism - Primary   Relevant Orders   CBC with Differential/Platelet   TSH     Other   Depression   Relevant Medications    gabapentin (NEURONTIN) 300 MG capsule   sertraline (ZOLOFT) 100 MG tablet   LORazepam (ATIVAN) 0.5 MG tablet   Other Relevant Orders   CBC with Differential/Platelet   ToxASSURE Select 13 (MW), Urine    Continue current medicine, will increase her Zoloft to help more with anxiety and depression to 150 mg.  Gave refill for the lorazepam. Follow up plan: Return in about 3 months (around 03/04/2023), or if symptoms worsen or fail to improve, for Hypertension and depression and hypothyroidism.  Counseling provided for all of the vaccine components Orders Placed This Encounter  Procedures   CBC with Differential/Platelet   CMP14+EGFR   Lipid panel   ToxASSURE Select 13 (MW), Urine   TSH    Arville Care, MD [facility] 12/02/2022, 2:59 PM

## 2022-12-03 LAB — CBC WITH DIFFERENTIAL/PLATELET
Basophils Absolute: 0 10*3/uL (ref 0.0–0.2)
Basos: 0 %
EOS (ABSOLUTE): 0.2 10*3/uL (ref 0.0–0.4)
Eos: 3 %
Hematocrit: 37.1 % (ref 34.0–46.6)
Hemoglobin: 12.5 g/dL (ref 11.1–15.9)
Immature Grans (Abs): 0 10*3/uL (ref 0.0–0.1)
Immature Granulocytes: 0 %
Lymphocytes Absolute: 1.8 10*3/uL (ref 0.7–3.1)
Lymphs: 20 %
MCH: 28.7 pg (ref 26.6–33.0)
MCHC: 33.7 g/dL (ref 31.5–35.7)
MCV: 85 fL (ref 79–97)
Monocytes Absolute: 0.7 10*3/uL (ref 0.1–0.9)
Monocytes: 7 %
Neutrophils Absolute: 6.3 10*3/uL (ref 1.4–7.0)
Neutrophils: 70 %
Platelets: 428 10*3/uL (ref 150–450)
RBC: 4.36 x10E6/uL (ref 3.77–5.28)
RDW: 12.8 % (ref 11.7–15.4)
WBC: 9 10*3/uL (ref 3.4–10.8)

## 2022-12-03 LAB — LIPID PANEL
Chol/HDL Ratio: 3.6 ratio (ref 0.0–4.4)
Cholesterol, Total: 197 mg/dL (ref 100–199)
HDL: 54 mg/dL (ref >39)
LDL Chol Calc (NIH): 113 mg/dL — ABNORMAL HIGH (ref 0–99)
Triglycerides: 171 mg/dL — ABNORMAL HIGH (ref 0–149)
VLDL Cholesterol Cal: 30 mg/dL (ref 5–40)

## 2022-12-03 LAB — CMP14+EGFR
ALT: 15 IU/L (ref 0–32)
AST: 17 IU/L (ref 0–40)
Albumin: 4.4 g/dL (ref 3.8–4.9)
Alkaline Phosphatase: 70 IU/L (ref 44–121)
BUN/Creatinine Ratio: 20 (ref 9–23)
BUN: 19 mg/dL (ref 6–24)
Bilirubin Total: 0.3 mg/dL (ref 0.0–1.2)
CO2: 26 mmol/L (ref 20–29)
Calcium: 9.6 mg/dL (ref 8.7–10.2)
Chloride: 98 mmol/L (ref 96–106)
Creatinine, Ser: 0.96 mg/dL (ref 0.57–1.00)
Globulin, Total: 3 g/dL (ref 1.5–4.5)
Glucose: 100 mg/dL — ABNORMAL HIGH (ref 70–99)
Potassium: 4.1 mmol/L (ref 3.5–5.2)
Sodium: 139 mmol/L (ref 134–144)
Total Protein: 7.4 g/dL (ref 6.0–8.5)
eGFR: 69 mL/min/{1.73_m2} (ref >59)

## 2022-12-03 LAB — TSH: TSH: 5.13 u[IU]/mL — ABNORMAL HIGH (ref 0.450–4.500)

## 2022-12-04 ENCOUNTER — Encounter: Payer: Self-pay | Admitting: Family Medicine

## 2022-12-04 ENCOUNTER — Ambulatory Visit: Payer: 59 | Admitting: Family Medicine

## 2022-12-04 VITALS — BP 152/91 | HR 84 | Ht 61.0 in | Wt 229.0 lb

## 2022-12-04 DIAGNOSIS — M545 Low back pain, unspecified: Secondary | ICD-10-CM

## 2022-12-04 DIAGNOSIS — E890 Postprocedural hypothyroidism: Secondary | ICD-10-CM

## 2022-12-04 MED ORDER — METHYLPREDNISOLONE ACETATE 80 MG/ML IJ SUSP
80.0000 mg | Freq: Once | INTRAMUSCULAR | Status: AC
  Administered 2022-12-04: 80 mg via INTRA_ARTICULAR

## 2022-12-04 MED ORDER — LEVOTHYROXINE SODIUM 200 MCG PO TABS: 200.0000 ug | ORAL_TABLET | Freq: Every day | ORAL | 1 refills | Status: DC

## 2022-12-04 NOTE — Progress Notes (Signed)
BP (!) 152/91   Pulse 84   Ht 5\' 1"  (1.549 m)   Wt 229 lb (103.9 kg)   LMP  (LMP Unknown) Comment: FULL   SpO2 95%   BMI 43.27 kg/m    Subjective:   Patient ID: Destiny [name], female    DOB: 1966/03/21, 57 y.o.   MRN: [id number]  HPI: Destiny [name] is a 57 y.o. female presenting on 12/04/2022 for Back Pain   HPI Back pain Patient comes in for back pain.  She has had injections before she wants to try to see if another steroid injection will help, we did not want to do the other day with her blood work.  It does hurt chronically and the steroid injections do help some.  Hypothyroidism recheck Patient is coming in for thyroid recheck today as well. They deny any issues with hair changes or heat or cold problems or diarrhea or constipation. They deny any chest pain or palpitations. They are currently on levothyroxine 175.  Micrograms   Relevant past medical, surgical, family and social history reviewed and updated as indicated. Interim medical history since our last visit reviewed. Allergies and medications reviewed and updated.  Review of Systems  Constitutional:  Negative for chills and fever.  Eyes:  Negative for visual disturbance.  Respiratory:  Negative for chest tightness and shortness of breath.   Cardiovascular:  Negative for chest pain and leg swelling.  Genitourinary:  Negative for difficulty urinating and dysuria.  Musculoskeletal:  Positive for arthralgias and back pain. Negative for gait problem.  Skin:  Negative for rash.  Neurological:  Negative for dizziness, light-headedness and headaches.  Psychiatric/Behavioral:  Negative for agitation and behavioral problems.   All other systems reviewed and are negative.   Per HPI unless specifically indicated above   Allergies as of 12/04/2022       Reactions   Latex Itching, Rash        Medication List        Accurate as of December 04, 2022  2:44 PM. If you have any questions, ask your nurse or doctor.           acetaminophen 500 MG tablet Commonly known as: TYLENOL Take 1,000 mg by mouth every 8 (eight) hours as needed for moderate pain.   albuterol 108 (90 Base) MCG/ACT inhaler Commonly known as: VENTOLIN HFA Inhale 1-2 puffs into the lungs every 6 (six) hours as needed for wheezing or shortness of breath.   ascorbic acid 500 MG tablet Commonly known as: VITAMIN C Take 500 mg by mouth daily.   azelastine 0.05 % ophthalmic solution Commonly known as: OPTIVAR Apply 1 drop to eye 2 (two) times daily.   colestipol 1 g tablet Commonly known as: COLESTID Take by mouth.   cyclobenzaprine 10 MG tablet Commonly known as: FLEXERIL TAKE 1 TABLET BY MOUTH AT BEDTIME AS NEEDED FOR MUSCLE SPASMS.   diclofenac 75 MG EC tablet Commonly known as: VOLTAREN Take 1 tab twice daily for muscle and joint pain   dicyclomine 20 MG tablet Commonly known as: BENTYL Take by mouth.   diphenoxylate-atropine 2.5-0.025 MG tablet Commonly known as: LOMOTIL Take 2 tablets by mouth 4 (four) times daily as needed.   fluconazole 200 MG tablet Commonly known as: DIFLUCAN Take 200 mg by mouth daily.   fluticasone 50 MCG/ACT nasal spray Commonly known as: FLONASE PLACE 1 SPRAY INTO BOTH NOSTRILS 2 (TWO) TIMES DAILY   gabapentin 300 MG capsule Commonly known as: NEURONTIN  TAKE 1 CAPSULE BY MOUTH THREE TIMES A DAY   hydrochlorothiazide 25 MG tablet Commonly known as: HYDRODIURIL Take 1 tablet (25 mg total) by mouth daily.   levothyroxine 200 MCG tablet Commonly known as: SYNTHROID Take 1 tablet (200 mcg total) by mouth daily. What changed:  medication strength how much to take Changed by: Elige Radon Carrol Hougland, MD   LORazepam 0.5 MG tablet Commonly known as: ATIVAN TAKE 1 TABLET (0.5 MG) BY MOUTH AT BEDTIME AS NEEDED FOR ANXIETY   mirabegron ER 25 MG Tb24 tablet Commonly known as: Myrbetriq Take 1 tablet (25 mg total) by mouth daily.   multivitamin with minerals tablet Take 1 tablet by mouth  daily.   naproxen sodium 220 MG tablet Commonly known as: ALEVE Take by mouth.   ondansetron 8 MG tablet Commonly known as: ZOFRAN Take 1 tablet by mouth every 8 hours as needed for nausea or vomiting.   QC TUMERIC COMPLEX PO Take 1 capsule by mouth daily.   sertraline 100 MG tablet Commonly known as: ZOLOFT Take 1.5 tablets (150 mg total) by mouth daily.   traZODone 100 MG tablet Commonly known as: DESYREL Take 1 tablet (100 mg total) by mouth at bedtime.   vitamin B-12 100 MCG tablet Commonly known as: CYANOCOBALAMIN Take 100 mcg by mouth daily.   VITAMIN D3 PO Take by mouth.   zinc gluconate 50 MG tablet Take 50 mg by mouth daily.         Objective:   BP (!) 152/91   Pulse 84   Ht 5\' 1"  (1.549 m)   Wt 229 lb (103.9 kg)   LMP  (LMP Unknown) Comment: FULL   SpO2 95%   BMI 43.27 kg/m   Wt Readings from Last 3 Encounters:  12/04/22 229 lb (103.9 kg)  12/02/22 229 lb (103.9 kg)  06/08/22 225 lb (102.1 kg)    Physical Exam Vitals and nursing note reviewed.  Constitutional:      General: She is not in acute distress.    Appearance: She is well-developed. She is not diaphoretic.  Eyes:     Conjunctiva/sclera: Conjunctivae normal.  Cardiovascular:     Rate and Rhythm: Normal rate and regular rhythm.     Heart sounds: Normal heart sounds. No murmur heard. Pulmonary:     Effort: Pulmonary effort is normal. No respiratory distress.     Breath sounds: Normal breath sounds. No wheezing.  Musculoskeletal:        General: Normal range of motion.     Lumbar back: Spasms and tenderness present. No deformity. Normal range of motion. Negative right straight leg raise test and negative left straight leg raise test.  Skin:    General: Skin is warm and dry.     Findings: No rash.  Neurological:     Mental Status: She is alert and oriented to person, place, and time.     Coordination: Coordination normal.  Psychiatric:        Behavior: Behavior normal.      Results for orders placed or performed in visit on 12/02/22  CBC with Differential/Platelet  Result Value Ref Range   WBC 9.0 3.4 - 10.8 x10E3/uL   RBC 4.36 3.77 - 5.28 x10E6/uL   Hemoglobin 12.5 11.1 - 15.9 g/dL   Hematocrit 95.6 21.3 - 46.6 %   MCV 85 79 - 97 fL   MCH 28.7 26.6 - 33.0 pg   MCHC 33.7 31.5 - 35.7 g/dL   RDW 08.6 57.8 - 46.9 %  Platelets 428 150 - 450 x10E3/uL   Neutrophils 70 Not Estab. %   Lymphs 20 Not Estab. %   Monocytes 7 Not Estab. %   Eos 3 Not Estab. %   Basos 0 Not Estab. %   Neutrophils Absolute 6.3 1.4 - 7.0 x10E3/uL   Lymphocytes Absolute 1.8 0.7 - 3.1 x10E3/uL   Monocytes Absolute 0.7 0.1 - 0.9 x10E3/uL   EOS (ABSOLUTE) 0.2 0.0 - 0.4 x10E3/uL   Basophils Absolute 0.0 0.0 - 0.2 x10E3/uL   Immature Granulocytes 0 Not Estab. %   Immature Grans (Abs) 0.0 0.0 - 0.1 x10E3/uL  CMP14+EGFR  Result Value Ref Range   Glucose 100 (H) 70 - 99 mg/dL   BUN 19 6 - 24 mg/dL   Creatinine, Ser 7.82 0.57 - 1.00 mg/dL   eGFR 69 >95 AO/ZHY/8.65   BUN/Creatinine Ratio 20 9 - 23   Sodium 139 134 - 144 mmol/L   Potassium 4.1 3.5 - 5.2 mmol/L   Chloride 98 96 - 106 mmol/L   CO2 26 20 - 29 mmol/L   Calcium 9.6 8.7 - 10.2 mg/dL   Total Protein 7.4 6.0 - 8.5 g/dL   Albumin 4.4 3.8 - 4.9 g/dL   Globulin, Total 3.0 1.5 - 4.5 g/dL   Bilirubin Total 0.3 0.0 - 1.2 mg/dL   Alkaline Phosphatase 70 44 - 121 IU/L   AST 17 0 - 40 IU/L   ALT 15 0 - 32 IU/L  Lipid panel  Result Value Ref Range   Cholesterol, Total 197 100 - 199 mg/dL   Triglycerides 784 (H) 0 - 149 mg/dL   HDL 54 >69 mg/dL   VLDL Cholesterol Cal 30 5 - 40 mg/dL   LDL Chol Calc (NIH) 629 (H) 0 - 99 mg/dL   Chol/HDL Ratio 3.6 0.0 - 4.4 ratio  TSH  Result Value Ref Range   TSH 5.130 (H) 0.450 - 4.500 uIU/mL    Assessment & Plan:   Problem List Items Addressed This Visit       Endocrine   Hypothyroidism   Relevant Medications   levothyroxine (SYNTHROID) 200 MCG tablet   Other Visit  Diagnoses     Lumbar pain    -  Primary   Relevant Medications   methylPREDNISolone acetate (DEPO-MEDROL) injection 80 mg (Start on 12/04/2022  2:45 PM)     Increase levothyroxine to 200 mcg daily  Gave intramuscular steroid injection  Follow up plan: Return if symptoms worsen or fail to improve.  Counseling provided for all of the vaccine components No orders of the defined types were placed in this encounter.   Arville Care, MD [facility] 12/04/2022, 2:44 PM

## 2022-12-07 LAB — TOXASSURE SELECT 13 (MW), URINE

## 2023-01-16 ENCOUNTER — Other Ambulatory Visit: Payer: Self-pay | Admitting: Family

## 2023-01-18 ENCOUNTER — Other Ambulatory Visit: Payer: Self-pay | Admitting: Family Medicine

## 2023-01-18 DIAGNOSIS — J069 Acute upper respiratory infection, unspecified: Secondary | ICD-10-CM

## 2023-01-18 NOTE — Telephone Encounter (Signed)
[facility] patient Last office visit 12/04/22 Last refill 10/13/22 #30, 2 refills

## 2023-02-18 ENCOUNTER — Ambulatory Visit: Payer: 59

## 2023-02-18 ENCOUNTER — Inpatient Hospital Stay (HOSPITAL_BASED_OUTPATIENT_CLINIC_OR_DEPARTMENT_OTHER): Payer: 59 | Admitting: Oncology

## 2023-02-18 ENCOUNTER — Inpatient Hospital Stay: Payer: 59 | Attending: Oncology

## 2023-02-18 ENCOUNTER — Ambulatory Visit (INDEPENDENT_AMBULATORY_CARE_PROVIDER_SITE_OTHER): Payer: 59 | Admitting: Family Medicine

## 2023-02-18 ENCOUNTER — Encounter: Payer: Self-pay | Admitting: Family Medicine

## 2023-02-18 VITALS — BP 141/73 | HR 84 | Temp 98.2°F | Resp 18 | Ht 61.0 in | Wt 227.1 lb

## 2023-02-18 VITALS — BP 141/73 | HR 76 | Temp 95.2°F | Ht 61.0 in | Wt 226.8 lb

## 2023-02-18 DIAGNOSIS — E0590 Thyrotoxicosis, unspecified without thyrotoxic crisis or storm: Secondary | ICD-10-CM | POA: Diagnosis not present

## 2023-02-18 DIAGNOSIS — Z85038 Personal history of other malignant neoplasm of large intestine: Secondary | ICD-10-CM | POA: Diagnosis not present

## 2023-02-18 DIAGNOSIS — R109 Unspecified abdominal pain: Secondary | ICD-10-CM

## 2023-02-18 DIAGNOSIS — I10 Essential (primary) hypertension: Secondary | ICD-10-CM | POA: Insufficient documentation

## 2023-02-18 DIAGNOSIS — N3001 Acute cystitis with hematuria: Secondary | ICD-10-CM | POA: Diagnosis not present

## 2023-02-18 DIAGNOSIS — G8929 Other chronic pain: Secondary | ICD-10-CM | POA: Diagnosis not present

## 2023-02-18 DIAGNOSIS — M545 Low back pain, unspecified: Secondary | ICD-10-CM | POA: Diagnosis not present

## 2023-02-18 DIAGNOSIS — C20 Malignant neoplasm of rectum: Secondary | ICD-10-CM

## 2023-02-18 LAB — URINALYSIS, ROUTINE W REFLEX MICROSCOPIC
Bilirubin, UA: NEGATIVE
Glucose, UA: NEGATIVE
Ketones, UA: NEGATIVE
Nitrite, UA: POSITIVE — AB
Specific Gravity, UA: >1.03 — ABNORMAL HIGH (ref 1.005–1.030)
Urobilinogen, Ur: 0.2 mg/dL (ref 0.2–1.0)
pH, UA: 6.5 (ref 5.0–7.5)

## 2023-02-18 LAB — MICROSCOPIC EXAMINATION
Renal Epithel, UA: NONE SEEN /hpf
WBC, UA: >30 /hpf — AB (ref 0–5)
Yeast, UA: NONE SEEN

## 2023-02-18 LAB — CEA (ACCESS): CEA (CHCC): <1 ng/mL (ref 0.00–5.00)

## 2023-02-18 MED ORDER — PHENAZOPYRIDINE HCL 100 MG PO TABS: 100.0000 mg | ORAL_TABLET | Freq: Three times a day (TID) | ORAL | 0 refills | Status: AC | PRN

## 2023-02-18 MED ORDER — SULFAMETHOXAZOLE-TRIMETHOPRIM 800-160 MG PO TABS: 1.0000 | ORAL_TABLET | Freq: Two times a day (BID) | ORAL | 0 refills | Status: AC

## 2023-02-18 NOTE — Progress Notes (Signed)
[facility] OFFICE PROGRESS NOTE   Diagnosis: Rectal cancer  INTERVAL HISTORY:   Destiny [name] returns as scheduled.  She reports frequent bowel months since undergoing rectal surgery.  No bleeding.  Good appetite.  She reports chronic back pain that is increased recently.  She was scheduled for a "cortisone "injection today, but this was held after she was diagnosed with urinary tract infection.  She was prescribed Bactrim.  She does not have urinary symptoms.  She underwent a colonoscopy by Dr. Christoper Allegra 08/26/2022.  Adenomatous polyps were removed.  A 5-year follow-up colonoscopy is recommended.  She reports balance difficulty and has experienced several falls.  Objective:  Vital signs in last 24 hours:  Blood pressure (!) 141/73, pulse 84, temperature 98.2 F (36.8 C), temperature source Temporal, resp. rate 18, height 5\' 1"  (1.549 m), weight 227 lb 1.6 oz (103 kg), SpO2 100%.    HEENT: Soft mobile 2 cm structure overlying overlying the left clavicle and in the supraclavicular fossa, neck without mass Lymphatics: No cervical, supraclavicular, axillary, or inguinal nodes Resp: Mild bilateral expiratory wheeze, no respiratory distress Cardio: Regular rate and rhythm GI: No mass, nontender, no hepatosplenomegaly Vascular: No leg edema  Lab Results:  Lab Results  Component Value Date   WBC 9.0 12/02/2022   HGB 12.5 12/02/2022   HCT 37.1 12/02/2022   MCV 85 12/02/2022   PLT 428 12/02/2022   NEUTROABS 6.3 12/02/2022    CMP  Lab Results  Component Value Date   NA 139 12/02/2022   K 4.1 12/02/2022   CL 98 12/02/2022   CO2 26 12/02/2022   GLUCOSE 100 (H) 12/02/2022   BUN 19 12/02/2022   CREATININE 0.96 12/02/2022   CALCIUM 9.6 12/02/2022   PROT 7.4 12/02/2022   ALBUMIN 4.4 12/02/2022   AST 17 12/02/2022   ALT 15 12/02/2022   ALKPHOS 70 12/02/2022   BILITOT 0.3 12/02/2022   GFRNONAA 69 03/22/2020   GFRAA 80 03/22/2020    Lab Results  Component Value Date    CEA1 1.15 02/17/2021   CEA 1.18 02/19/2022   Medications: I have reviewed the patient's current medications.   Assessment/Plan: Rectal cancer-clinical stage III (T3 N1) No loss of mismatch repair protein expression T3 N1 tumor beginning at 2 cm from the anal verge on an MRI of the pelvis 11/08/2015 CTs of the chest, abdomen, and pelvis 11/01/2015 with no evidence of distant metastatic disease Initiation of neoadjuvant radiation/Xeloda 11/25/2015; completion of radiation/Xeloda 01/02/2016 Low anterior resection with a coloanal anastomosis 02/26/2016,ypT2,ypN0. Grade 3 adenocarcinoma, negative resection margins 02/26/2016 No BRAF, KRAS, NRAS mutation; Microsatellite status could not be determined CT abdomen/pelvis 03/30/2016-no evidence of recurrent rectal cancer Cycle 1 adjuvant Xeloda 04/14/2016 Cycle 2 adjuvant Xeloda 05/08/2016 Cycle 3 adjuvant Xeloda 05/29/2016 Cycle 4 adjuvant Xeloda 06/20/2016 (completed approximately 10 days) Cycle 5 adjuvant Xeloda started approximately 07/19/2016 CT 12/04/2016-no evidence of metastatic disease CT 12/08/2017- no evidence of metastatic disease CT 12/13/2018-no evidence of metastatic disease Colonoscopy 08/26/2022-adenomatous polyps removed   2. History of hypertension   3. Left ureteral injury at the low anterior resection-status post a left ureteral neocystostomy and left double-J stent placement 02/26/2016   4. Hyperthyroidism status post radioactive iodine 05/12/2016   5. Ileostomy reversal 09/02/2016   6. Hospitalization 10/20/2016 through 10/22/2016 with UTI/sepsis.       Disposition: [name] is in clinical remission from rectal cancer.  She like to continue follow-up with the cancer center.  She will return for an office visit  in 1 year.  She will continue colonoscopy surveillance with Dr. Christoper Allegra.  I recommended she follow-up with her primary provider for evaluation of the balance difficulty.  Thornton Papas, MD  02/18/2023  3:04  PM

## 2023-02-18 NOTE — Progress Notes (Signed)
Subjective:  Patient ID: Destiny [name], female    DOB: 11-02-1965, 57 y.o.   MRN: [id number]  Patient Care Team: Dettinger, Elige Radon, MD as PCP - General (Family Medicine) Patwa, Diamond Nickel, MD as Referring Physician (Gastroenterology) Ladene Artist, MD as Consulting Physician (Oncology) Michaelle Copas, MD as Referring Physician (Optometry)   Chief Complaint:  Back Pain (X 2-3 days )   HPI: Destiny [name] is a 57 y.o. female presenting on 02/18/2023 for Back Pain (X 2-3 days )   Back Pain This is a new problem. The current episode started in the past 7 days. The problem has been gradually worsening since onset. Pain location: flank. The quality of the pain is described as aching. The pain does not radiate. The pain is moderate. Associated symptoms include abdominal pain and dysuria. Pertinent negatives include no bladder incontinence, bowel incontinence, chest pain, fever, headaches, leg pain, numbness, paresis, paresthesias, pelvic pain, perianal numbness, tingling, weakness or weight loss. She has tried nothing for the symptoms.      Relevant past medical, surgical, family, and social history reviewed and updated as indicated.  Allergies and medications reviewed and updated. Data reviewed: Chart in Epic.   Past Medical History:  Diagnosis Date   Chronic back pain    Depression    Hypertension    Hyperthyroidism    Rectal cancer (HCC) 10/29/2015   rectal invasice adenocarcinoma,Adenocarcinoma dx 10/29/15; Stage III, T3, N1, MO    Past Surgical History:  Procedure Laterality Date   ABDOMINAL HYSTERECTOMY     vaginal    BLADDER SURGERY     EYE SURGERY     LAZY EYE CORRECTION   ILEOSTOMY     ILEOSTOMY CLOSURE N/A 09/02/2016   Procedure: ILEOSTOMY REVERSAL;  Surgeon: Romie Levee, MD;  Location: [facility];  Service: General;  Laterality: N/A;   RECTAL EXAM UNDER ANESTHESIA N/A 09/02/2016   Procedure: ANAL EXAM UNDER ANESTHESIA;  Surgeon: Romie Levee, MD;  Location: [facility];   Service: General;  Laterality: N/A;   URETERAL REIMPLANTION Left 02/26/2016   Procedure: left ureteral neocystotomy,double J stent placement, closure vaginal cuff;  Surgeon: Crist Fat, MD;  Location: [facility];  Service: Urology;  Laterality: Left;   VAGINAL HYSTERECTOMY     XI ROBOTIC ASSISTED LOWER ANTERIOR RESECTION N/A 02/26/2016   Procedure: XI ROBOTIC ASSISTED LOWER ANTERIOR RESECTION, diverting LOOP ILEOSTOMY, mobilization splenic flexure, omentopexy;  Surgeon: Romie Levee, MD;  Location: [facility];  Service: General;  Laterality: N/A;    Social History   Socioeconomic History   Marital status: Divorced    Spouse name: Not on file   Number of children: 3   Years of education: Not on file   Highest education level: Some college, no degree  Occupational History   Occupation: Disabled  Tobacco Use   Smoking status: Never   Smokeless tobacco: Never  Vaping Use   Vaping status: Never Used  Substance and Sexual Activity   Alcohol use: No    Alcohol/week: 0.0 standard drinks of alcohol    Comment: occ   Drug use: No   Sexual activity: Yes    Birth control/protection: Surgical  Other Topics Concern   Not on file  Social History Narrative   She is on disability.   Grandson lives with her.   Social Determinants of Health   Financial Resource Strain: Low Risk  (06/08/2022)   Overall Financial Resource Strain (CARDIA)    Difficulty of  Paying Living Expenses: Not hard at all  Food Insecurity: No Food Insecurity (06/08/2022)   Hunger Vital Sign    Worried About Running Out of Food in the Last Year: Never true    Ran Out of Food in the Last Year: Never true  Transportation Needs: No Transportation Needs (06/08/2022)   PRAPARE - Administrator, Civil Service (Medical): No    Lack of Transportation (Non-Medical): No  Physical Activity: Inactive (06/08/2022)   Exercise Vital Sign    Days of Exercise per Week: 0 days    Minutes of Exercise per Session: 0 min  Stress: No  Stress Concern Present (06/08/2022)   [employer] of Occupational Health - Occupational Stress Questionnaire    Feeling of Stress : Not at all  Social Connections: Moderately Integrated (06/08/2022)   Social Connection and Isolation Panel [NHANES]    Frequency of Communication with Friends and Family: More than three times a week    Frequency of Social Gatherings with Friends and Family: More than three times a week    Attends Religious Services: More than 4 times per year    Active Member of [employer] or Organizations: Yes    Attends Engineer, structural: More than 4 times per year    Marital Status: Divorced  Intimate Partner Violence: Not At Risk (06/08/2022)   Humiliation, Afraid, Rape, and Kick questionnaire    Fear of Current or Ex-Partner: No    Emotionally Abused: No    Physically Abused: No    Sexually Abused: No    Outpatient Encounter Medications as of 02/18/2023  Medication Sig   acetaminophen (TYLENOL) 500 MG tablet Take 1,000 mg by mouth every 8 (eight) hours as needed for moderate pain.   albuterol (VENTOLIN HFA) 108 (90 Base) MCG/ACT inhaler Inhale 1-2 puffs into the lungs every 6 (six) hours as needed for wheezing or shortness of breath.   azelastine (OPTIVAR) 0.05 % ophthalmic solution Apply 1 drop to eye 2 (two) times daily.   Cholecalciferol (VITAMIN D3 PO) Take by mouth.   colestipol (COLESTID) 1 g tablet Take by mouth.   cyclobenzaprine (FLEXERIL) 10 MG tablet TAKE 1 TABLET BY MOUTH AT BEDTIME AS NEEDED FOR MUSCLE SPASMS.   diclofenac (VOLTAREN) 75 MG EC tablet Take 1 tab twice daily for muscle and joint pain   diphenoxylate-atropine (LOMOTIL) 2.5-0.025 MG tablet Take 2 tablets by mouth 4 (four) times daily as needed.   fluconazole (DIFLUCAN) 200 MG tablet Take 200 mg by mouth daily.   fluticasone (FLONASE) 50 MCG/ACT nasal spray PLACE 1 SPRAY INTO BOTH NOSTRILS 2 (TWO) TIMES DAILY   gabapentin (NEURONTIN) 300 MG capsule TAKE 1 CAPSULE BY MOUTH THREE TIMES A  DAY   hydrochlorothiazide (HYDRODIURIL) 25 MG tablet Take 1 tablet (25 mg total) by mouth daily.   levothyroxine (SYNTHROID) 200 MCG tablet Take 1 tablet (200 mcg total) by mouth daily.   LORazepam (ATIVAN) 0.5 MG tablet TAKE 1 TABLET (0.5 MG) BY MOUTH AT BEDTIME AS NEEDED FOR ANXIETY   mirabegron ER (MYRBETRIQ) 25 MG TB24 tablet Take 1 tablet (25 mg total) by mouth daily.   Multiple Vitamins-Minerals (MULTIVITAMIN WITH MINERALS) tablet Take 1 tablet by mouth daily.   naproxen sodium (ALEVE) 220 MG tablet Take 220 mg by mouth daily as needed.   ondansetron (ZOFRAN) 8 MG tablet Take 1 tablet by mouth every 8 hours as needed for nausea or vomiting.   phenazopyridine (PYRIDIUM) 100 MG tablet Take 1 tablet (100 mg total)  by mouth 3 (three) times daily as needed for up to 3 days for pain.   sertraline (ZOLOFT) 100 MG tablet Take 1.5 tablets (150 mg total) by mouth daily.   sulfamethoxazole-trimethoprim (BACTRIM DS) 800-160 MG tablet Take 1 tablet by mouth 2 (two) times daily for 7 days.   traZODone (DESYREL) 100 MG tablet Take 1 tablet (100 mg total) by mouth at bedtime.   Turmeric (QC TUMERIC COMPLEX PO) Take 1 capsule by mouth daily.   vitamin B-12 (CYANOCOBALAMIN) 100 MCG tablet Take 100 mcg by mouth daily.   vitamin C (ASCORBIC ACID) 500 MG tablet Take 500 mg by mouth daily.   zinc gluconate 50 MG tablet Take 50 mg by mouth daily.   dicyclomine (BENTYL) 20 MG tablet Take by mouth.   No facility-administered encounter medications on file as of 02/18/2023.    Allergies  Allergen Reactions   Latex Itching and Rash    Review of Systems  Constitutional:  Negative for activity change, appetite change, chills, fatigue, fever and weight loss.  HENT: Negative.    Eyes: Negative.   Respiratory:  Negative for cough, chest tightness and shortness of breath.   Cardiovascular:  Negative for chest pain, palpitations and leg swelling.  Gastrointestinal:  Positive for abdominal pain. Negative for blood  in stool, bowel incontinence, constipation, diarrhea, nausea and vomiting.  Endocrine: Negative.   Genitourinary:  Positive for dysuria. Negative for bladder incontinence, decreased urine volume, difficulty urinating, frequency, pelvic pain and urgency.  Musculoskeletal:  Positive for back pain. Negative for arthralgias and myalgias.  Skin: Negative.   Allergic/Immunologic: Negative.   Neurological:  Negative for dizziness, tingling, weakness, numbness, headaches and paresthesias.  Hematological: Negative.   Psychiatric/Behavioral:  Negative for confusion, hallucinations, sleep disturbance and suicidal ideas.   All other systems reviewed and are negative.       Objective:  BP (!) 141/73   Pulse 76   Temp (!) 95.2 F (35.1 C) (Temporal)   Ht 5\' 1"  (1.549 m)   Wt 226 lb 12.8 oz (102.9 kg)   LMP  (LMP Unknown) Comment: FULL   SpO2 92%   BMI 42.85 kg/m    Wt Readings from Last 3 Encounters:  02/18/23 227 lb 1.6 oz (103 kg)  02/18/23 226 lb 12.8 oz (102.9 kg)  12/04/22 229 lb (103.9 kg)    Physical Exam Vitals and nursing note reviewed.  Constitutional:      General: She is not in acute distress.    Appearance: Normal appearance. She is well-developed and well-groomed. She is not ill-appearing, toxic-appearing or diaphoretic.  HENT:     Head: Normocephalic and atraumatic.     Jaw: There is normal jaw occlusion.     Right Ear: Hearing normal.     Left Ear: Hearing normal.     Nose: Nose normal.     Mouth/Throat:     Lips: Pink.     Mouth: Mucous membranes are moist.     Pharynx: Oropharynx is clear. Uvula midline.  Eyes:     General: Lids are normal.     Extraocular Movements: Extraocular movements intact.     Conjunctiva/sclera: Conjunctivae normal.     Pupils: Pupils are equal, round, and reactive to light.  Neck:     Thyroid: No thyroid mass, thyromegaly or thyroid tenderness.     Vascular: No carotid bruit or JVD.     Trachea: Trachea and phonation normal.   Cardiovascular:     Rate and Rhythm: Normal rate and  regular rhythm.     Chest Wall: PMI is not displaced.     Pulses: Normal pulses.     Heart sounds: Normal heart sounds. No murmur heard.    No friction rub. No gallop.  Pulmonary:     Effort: Pulmonary effort is normal. No respiratory distress.     Breath sounds: Normal breath sounds. No wheezing.  Abdominal:     General: Bowel sounds are normal. There is no distension or abdominal bruit.     Palpations: Abdomen is soft. There is no hepatomegaly or splenomegaly.     Tenderness: There is no abdominal tenderness. There is no right CVA tenderness or left CVA tenderness.     Hernia: No hernia is present.  Musculoskeletal:        General: Normal range of motion.     Cervical back: Normal range of motion and neck supple.     Right lower leg: No edema.     Left lower leg: No edema.  Lymphadenopathy:     Cervical: No cervical adenopathy.  Skin:    General: Skin is warm and dry.     Capillary Refill: Capillary refill takes less than 2 seconds.     Coloration: Skin is not cyanotic, jaundiced or pale.     Findings: No rash.  Neurological:     General: No focal deficit present.     Mental Status: She is alert and oriented to person, place, and time.     Sensory: Sensation is intact.     Motor: Motor function is intact.     Coordination: Coordination is intact.     Gait: Gait is intact.     Deep Tendon Reflexes: Reflexes are normal and symmetric.  Psychiatric:        Attention and Perception: Attention and perception normal.        Mood and Affect: Mood and affect normal.        Speech: Speech normal.        Behavior: Behavior normal. Behavior is cooperative.        Thought Content: Thought content normal.        Cognition and Memory: Cognition and memory normal.        Judgment: Judgment normal.     Results for orders placed or performed in visit on 02/18/23  Microscopic Examination   Urine  Result Value Ref Range   WBC, UA  >30 (A) 0 - 5 /hpf   RBC, Urine 0-2 0 - 2 /hpf   Epithelial Cells (non renal) 0-10 0 - 10 /hpf   Renal Epithel, UA None seen None seen /hpf   Bacteria, UA Many (A) None seen/Few   Yeast, UA None seen None seen  Urinalysis, Routine w reflex microscopic  Result Value Ref Range   Specific Gravity, UA >1.030 (H) 1.005 - 1.030   pH, UA 6.5 5.0 - 7.5   Color, UA Yellow Yellow   Appearance Ur Cloudy (A) Clear   Leukocytes,UA 3+ (A) Negative   Protein,UA 2+ (A) Negative/Trace   Glucose, UA Negative Negative   Ketones, UA Negative Negative   RBC, UA Trace (A) Negative   Bilirubin, UA Negative Negative   Urobilinogen, Ur 0.2 0.2 - 1.0 mg/dL   Nitrite, UA Positive (A) Negative   Microscopic Examination See below:        Pertinent labs & imaging results that were available during my care of the patient were reviewed by me and considered in my medical decision making.  Assessment & Plan:  Elaf "MaryJane" was seen today for back pain.  Diagnoses and all orders for this visit:  Flank pain Urinalysis significant for acute cystitis with hematuria. Will start antibiotics. Culture pending. Will change if warranted. Will check renal function and CBC.  -     Urinalysis, Routine w reflex microscopic -     Urine Culture -     CBC with Differential/Platelet -     BMP8+EGFR -     Microscopic Examination  Acute cystitis with hematuria -     sulfamethoxazole-trimethoprim (BACTRIM DS) 800-160 MG tablet; Take 1 tablet by mouth 2 (two) times daily for 7 days. -     phenazopyridine (PYRIDIUM) 100 MG tablet; Take 1 tablet (100 mg total) by mouth 3 (three) times daily as needed for up to 3 days for pain.     Continue all other maintenance medications.  Follow up plan: Return if symptoms worsen or fail to improve.   Continue healthy lifestyle choices, including diet (rich in fruits, vegetables, and lean proteins, and low in salt and simple carbohydrates) and exercise (at least 30 minutes of moderate  physical activity daily).  Educational handout given for UTI  The above assessment and management plan was discussed with the patient. The patient verbalized understanding of and has agreed to the management plan. Patient is aware to call the clinic if they develop any new symptoms or if symptoms persist or worsen. Patient is aware when to return to the clinic for a follow-up visit. Patient educated on when it is appropriate to go to the emergency department.   Kari Baars, FNP-C [facility] [phone]

## 2023-02-19 LAB — CBC WITH DIFFERENTIAL/PLATELET
Basophils Absolute: 0.1 10*3/uL (ref 0.0–0.2)
Basos: 1 %
EOS (ABSOLUTE): 0.2 10*3/uL (ref 0.0–0.4)
Eos: 3 %
Hematocrit: 36.5 % (ref 34.0–46.6)
Hemoglobin: 11.9 g/dL (ref 11.1–15.9)
Immature Grans (Abs): 0 10*3/uL (ref 0.0–0.1)
Immature Granulocytes: 0 %
Lymphocytes Absolute: 1.6 10*3/uL (ref 0.7–3.1)
Lymphs: 20 %
MCH: 29.4 pg (ref 26.6–33.0)
MCHC: 32.6 g/dL (ref 31.5–35.7)
MCV: 90 fL (ref 79–97)
Monocytes Absolute: 0.7 10*3/uL (ref 0.1–0.9)
Monocytes: 8 %
Neutrophils Absolute: 5.4 10*3/uL (ref 1.4–7.0)
Neutrophils: 68 %
Platelets: 368 10*3/uL (ref 150–450)
RBC: 4.05 x10E6/uL (ref 3.77–5.28)
RDW: 13.9 % (ref 11.7–15.4)
WBC: 8 10*3/uL (ref 3.4–10.8)

## 2023-02-19 LAB — BMP8+EGFR
BUN/Creatinine Ratio: 21 (ref 9–23)
BUN: 18 mg/dL (ref 6–24)
CO2: 25 mmol/L (ref 20–29)
Calcium: 9.3 mg/dL (ref 8.7–10.2)
Chloride: 102 mmol/L (ref 96–106)
Creatinine, Ser: 0.85 mg/dL (ref 0.57–1.00)
Glucose: 102 mg/dL — ABNORMAL HIGH (ref 70–99)
Potassium: 4 mmol/L (ref 3.5–5.2)
Sodium: 143 mmol/L (ref 134–144)
eGFR: 80 mL/min/{1.73_m2} (ref >59)

## 2023-02-22 LAB — URINE CULTURE

## 2023-03-05 ENCOUNTER — Encounter: Payer: Self-pay | Admitting: Family Medicine

## 2023-03-05 ENCOUNTER — Ambulatory Visit (INDEPENDENT_AMBULATORY_CARE_PROVIDER_SITE_OTHER): Payer: 59 | Admitting: Family Medicine

## 2023-03-05 VITALS — BP 171/90 | HR 77 | Ht 61.0 in | Wt 232.0 lb

## 2023-03-05 DIAGNOSIS — F331 Major depressive disorder, recurrent, moderate: Secondary | ICD-10-CM

## 2023-03-05 DIAGNOSIS — E890 Postprocedural hypothyroidism: Secondary | ICD-10-CM

## 2023-03-05 DIAGNOSIS — Z23 Encounter for immunization: Secondary | ICD-10-CM

## 2023-03-05 DIAGNOSIS — M7062 Trochanteric bursitis, left hip: Secondary | ICD-10-CM | POA: Diagnosis not present

## 2023-03-05 DIAGNOSIS — I10 Essential (primary) hypertension: Secondary | ICD-10-CM | POA: Diagnosis not present

## 2023-03-05 MED ORDER — DULOXETINE HCL 30 MG PO CPEP: ORAL_CAPSULE | ORAL | 1 refills | Status: DC

## 2023-03-05 NOTE — Patient Instructions (Addendum)
Taper down off the Zoloft by taking 100 mg or 1 tablet daily for a week and then go down to 50 mg or a half a tablet daily for a week and then start the Cymbalta at 30 mg daily for 1 week and then go up to 60 mg daily until you see me

## 2023-03-05 NOTE — Progress Notes (Signed)
BP (!) 171/90   Pulse 77   Ht 5\' 1"  (1.549 m)   Wt 232 lb (105.2 kg)   LMP  (LMP Unknown) Comment: FULL   SpO2 94%   BMI 43.84 kg/m    Subjective:   Patient ID: Destiny [name], female    DOB: 1966/05/04, 57 y.o.   MRN: [id number]  HPI: Destiny [name] is a 57 y.o. female presenting on 03/05/2023 for Medical Management of Chronic Issues, Hypothyroidism, Hypertension, Anxiety, and Depression   HPI Hypertension Patient is currently on hydrochlorothiazide, and their blood pressure today is 147/75. Patient denies any lightheadedness or dizziness. Patient denies headaches, blurred vision, chest pains, shortness of breath, or weakness. Denies any side effects from medication and is content with current medication.   Hypothyroidism recheck Patient is coming in for thyroid recheck today as well. They deny any issues with hair changes or heat or cold problems or diarrhea or constipation. They deny any chest pain or palpitations. They are currently on levothyroxine 200 micrograms   Depression Patient takes lorazepam and zoloft and trazodone and she says she has been still crying a lot and having anxiety a lot and depression a lot and not doing as well.  Patient denies any suicidal ideations.  She is says she has not been doing as well with anxiety depression and the Zoloft trazodone and lorazepam, they do help with sleep but they do not help her through the daytime as much.    03/05/2023    3:34 PM 12/02/2022    2:29 PM 06/08/2022    8:54 AM 06/09/2021    2:36 PM 06/04/2021    9:16 AM  Depression screen PHQ 2/9  Decreased Interest 2 3 0 2 1  Down, Depressed, Hopeless 2 3 0 2 1  PHQ - 2 Score 4 6 0 4 2  Altered sleeping 2 2  2 1   Tired, decreased energy 3 3  3 1   Change in appetite 1 1  1  0  Feeling bad or failure about yourself  1 2  2 1   Trouble concentrating 1 2  2 1   Moving slowly or fidgety/restless 2 1  0 0  Suicidal thoughts 0 0  0 0  PHQ-9 Score 14 17  14 6   Difficult doing work/chores  Somewhat difficult Very difficult   Somewhat difficult     Relevant past medical, surgical, family and social history reviewed and updated as indicated. Interim medical history since our last visit reviewed. Allergies and medications reviewed and updated.  Review of Systems  Constitutional:  Negative for chills and fever.  Eyes:  Negative for visual disturbance.  Respiratory:  Negative for chest tightness and shortness of breath.   Cardiovascular:  Negative for chest pain and leg swelling.  Genitourinary:  Negative for difficulty urinating and dysuria.  Musculoskeletal:  Positive for arthralgias, back pain, gait problem and myalgias.  Skin:  Negative for rash.  Neurological:  Negative for dizziness, light-headedness and headaches.  Psychiatric/Behavioral:  Positive for dysphoric mood and sleep disturbance. Negative for agitation, behavioral problems, self-injury and suicidal ideas. The patient is nervous/anxious.   All other systems reviewed and are negative.   Per HPI unless specifically indicated above   Allergies as of 03/05/2023       Reactions   Latex Itching, Rash        Medication List        Accurate as of March 05, 2023  3:54 PM. If you have any questions, ask  your nurse or doctor.          STOP taking these medications    sertraline 100 MG tablet Commonly known as: ZOLOFT Stopped by: Elige Radon Bellarose Burtt       TAKE these medications    acetaminophen 500 MG tablet Commonly known as: TYLENOL Take 1,000 mg by mouth every 8 (eight) hours as needed for moderate pain.   albuterol 108 (90 Base) MCG/ACT inhaler Commonly known as: VENTOLIN HFA Inhale 1-2 puffs into the lungs every 6 (six) hours as needed for wheezing or shortness of breath.   ascorbic acid 500 MG tablet Commonly known as: VITAMIN C Take 500 mg by mouth daily.   azelastine 0.05 % ophthalmic solution Commonly known as: OPTIVAR Apply 1 drop to eye 2 (two) times daily.   colestipol 1 g  tablet Commonly known as: COLESTID Take by mouth.   cyclobenzaprine 10 MG tablet Commonly known as: FLEXERIL TAKE 1 TABLET BY MOUTH AT BEDTIME AS NEEDED FOR MUSCLE SPASMS.   diclofenac 75 MG EC tablet Commonly known as: VOLTAREN Take 1 tab twice daily for muscle and joint pain   dicyclomine 20 MG tablet Commonly known as: BENTYL Take by mouth.   diphenoxylate-atropine 2.5-0.025 MG tablet Commonly known as: LOMOTIL Take 2 tablets by mouth 4 (four) times daily as needed.   DULoxetine 30 MG capsule Commonly known as: Cymbalta Take 1 capsule (30 mg total) by mouth daily for 7 days, THEN 2 capsules (60 mg total) daily. Start taking on: March 05, 2023 Started by: Ivin Booty A Carmyn Hamm   fluconazole 200 MG tablet Commonly known as: DIFLUCAN Take 200 mg by mouth daily.   fluticasone 50 MCG/ACT nasal spray Commonly known as: FLONASE PLACE 1 SPRAY INTO BOTH NOSTRILS 2 (TWO) TIMES DAILY   gabapentin 300 MG capsule Commonly known as: NEURONTIN TAKE 1 CAPSULE BY MOUTH THREE TIMES A DAY   hydrochlorothiazide 25 MG tablet Commonly known as: HYDRODIURIL Take 1 tablet (25 mg total) by mouth daily.   levothyroxine 200 MCG tablet Commonly known as: SYNTHROID Take 1 tablet (200 mcg total) by mouth daily.   LORazepam 0.5 MG tablet Commonly known as: ATIVAN TAKE 1 TABLET (0.5 MG) BY MOUTH AT BEDTIME AS NEEDED FOR ANXIETY   mirabegron ER 25 MG Tb24 tablet Commonly known as: Myrbetriq Take 1 tablet (25 mg total) by mouth daily.   multivitamin with minerals tablet Take 1 tablet by mouth daily.   naproxen sodium 220 MG tablet Commonly known as: ALEVE Take 220 mg by mouth daily as needed.   ondansetron 8 MG tablet Commonly known as: ZOFRAN Take 1 tablet by mouth every 8 hours as needed for nausea or vomiting.   QC TUMERIC COMPLEX PO Take 1 capsule by mouth daily.   traZODone 100 MG tablet Commonly known as: DESYREL Take 1 tablet (100 mg total) by mouth at bedtime.   vitamin  B-12 100 MCG tablet Commonly known as: CYANOCOBALAMIN Take 100 mcg by mouth daily.   VITAMIN D3 PO Take by mouth.   zinc gluconate 50 MG tablet Take 50 mg by mouth daily.         Objective:   BP (!) 171/90   Pulse 77   Ht 5\' 1"  (1.549 m)   Wt 232 lb (105.2 kg)   LMP  (LMP Unknown) Comment: FULL   SpO2 94%   BMI 43.84 kg/m   Wt Readings from Last 3 Encounters:  03/05/23 232 lb (105.2 kg)  02/18/23 227 lb 1.6 oz (  103 kg)  02/18/23 226 lb 12.8 oz (102.9 kg)    Physical Exam Vitals and nursing note reviewed.  Constitutional:      General: She is not in acute distress.    Appearance: She is well-developed. She is not diaphoretic.  Eyes:     Conjunctiva/sclera: Conjunctivae normal.  Cardiovascular:     Rate and Rhythm: Normal rate and regular rhythm.     Heart sounds: Normal heart sounds. No murmur heard. Pulmonary:     Effort: Pulmonary effort is normal. No respiratory distress.     Breath sounds: Normal breath sounds. No wheezing.  Musculoskeletal:        General: No swelling. Normal range of motion.     Right hip: No tenderness, bony tenderness or crepitus. Normal range of motion.     Left hip: Tenderness (Lateral tenderness over left trochanteric) present. No deformity, bony tenderness or crepitus. Normal range of motion.  Skin:    General: Skin is warm and dry.     Findings: No rash.  Neurological:     Mental Status: She is alert and oriented to person, place, and time.     Coordination: Coordination normal.  Psychiatric:        Behavior: Behavior normal.       Assessment & Plan:   Problem List Items Addressed This Visit       Cardiovascular and Mediastinum   Essential hypertension, benign - Primary   Relevant Orders   CMP14+EGFR   Lipid panel     Endocrine   Hypothyroidism   Relevant Orders   TSH     Other   Depression   Relevant Medications   DULoxetine (CYMBALTA) 30 MG capsule   Other Visit Diagnoses     Greater trochanteric  bursitis of left hip       Relevant Orders   Ambulatory referral to Physical Therapy       Taper down off the Zoloft by taking 100 mg or 1 tablet daily for a week and then go down to 50 mg or a half a tablet daily for a week and then start the Cymbalta at 30 mg daily for 1 week and then go up to 60 mg daily until you see me  Will check blood work today.  Referral to physical therapy for left trochanteric bursitis Follow up plan: Return in about 3 months (around 06/05/2023), or if symptoms worsen or fail to improve, for Hypertension and anxiety depression recheck.  Counseling provided for all of the vaccine components Orders Placed This Encounter  Procedures   CMP14+EGFR   TSH   Lipid panel   Ambulatory referral to Physical Therapy    Arville Care, MD [facility] 03/05/2023, 3:54 PM

## 2023-03-17 ENCOUNTER — Telehealth: Payer: Self-pay | Admitting: *Deleted

## 2023-03-17 NOTE — Telephone Encounter (Signed)
Called patient to f/u on PT referral. Left on VM the phone # [phone] to call to schedule her appointment. They have reached out to her to schedule and left message.

## 2023-03-27 DIAGNOSIS — R82998 Other abnormal findings in urine: Secondary | ICD-10-CM | POA: Diagnosis not present

## 2023-03-27 DIAGNOSIS — M549 Dorsalgia, unspecified: Secondary | ICD-10-CM | POA: Diagnosis not present

## 2023-03-27 DIAGNOSIS — M545 Low back pain, unspecified: Secondary | ICD-10-CM | POA: Diagnosis not present

## 2023-03-27 DIAGNOSIS — M48061 Spinal stenosis, lumbar region without neurogenic claudication: Secondary | ICD-10-CM | POA: Diagnosis not present

## 2023-03-27 DIAGNOSIS — M51369 Other intervertebral disc degeneration, lumbar region without mention of lumbar back pain or lower extremity pain: Secondary | ICD-10-CM | POA: Diagnosis not present

## 2023-03-27 DIAGNOSIS — M47816 Spondylosis without myelopathy or radiculopathy, lumbar region: Secondary | ICD-10-CM | POA: Diagnosis not present

## 2023-04-19 ENCOUNTER — Other Ambulatory Visit: Payer: Self-pay | Admitting: Family Medicine

## 2023-04-22 ENCOUNTER — Other Ambulatory Visit: Payer: Self-pay | Admitting: Nurse Practitioner

## 2023-05-31 DIAGNOSIS — J988 Other specified respiratory disorders: Secondary | ICD-10-CM | POA: Diagnosis not present

## 2023-05-31 DIAGNOSIS — R051 Acute cough: Secondary | ICD-10-CM | POA: Diagnosis not present

## 2023-05-31 DIAGNOSIS — H1033 Unspecified acute conjunctivitis, bilateral: Secondary | ICD-10-CM | POA: Diagnosis not present

## 2023-05-31 DIAGNOSIS — R1110 Vomiting, unspecified: Secondary | ICD-10-CM | POA: Diagnosis not present

## 2023-05-31 DIAGNOSIS — U071 COVID-19: Secondary | ICD-10-CM | POA: Diagnosis not present

## 2023-05-31 DIAGNOSIS — R509 Fever, unspecified: Secondary | ICD-10-CM | POA: Diagnosis not present

## 2023-05-31 DIAGNOSIS — B9689 Other specified bacterial agents as the cause of diseases classified elsewhere: Secondary | ICD-10-CM | POA: Diagnosis not present

## 2023-06-06 ENCOUNTER — Other Ambulatory Visit: Payer: Self-pay | Admitting: Family Medicine

## 2023-06-06 DIAGNOSIS — E890 Postprocedural hypothyroidism: Secondary | ICD-10-CM

## 2023-06-07 ENCOUNTER — Encounter: Payer: Self-pay | Admitting: Family Medicine

## 2023-06-07 ENCOUNTER — Ambulatory Visit (INDEPENDENT_AMBULATORY_CARE_PROVIDER_SITE_OTHER): Payer: 59 | Admitting: Family Medicine

## 2023-06-07 VITALS — BP 177/76 | HR 88 | Ht 61.0 in | Wt 231.0 lb

## 2023-06-07 DIAGNOSIS — I10 Essential (primary) hypertension: Secondary | ICD-10-CM | POA: Diagnosis not present

## 2023-06-07 DIAGNOSIS — E890 Postprocedural hypothyroidism: Secondary | ICD-10-CM | POA: Diagnosis not present

## 2023-06-07 DIAGNOSIS — D649 Anemia, unspecified: Secondary | ICD-10-CM | POA: Diagnosis not present

## 2023-06-07 DIAGNOSIS — K625 Hemorrhage of anus and rectum: Secondary | ICD-10-CM | POA: Diagnosis not present

## 2023-06-07 LAB — LIPID PANEL

## 2023-06-07 MED ORDER — MIRABEGRON ER 25 MG PO TB24: 25.0000 mg | ORAL_TABLET | Freq: Every day | ORAL | 0 refills | Status: DC

## 2023-06-07 NOTE — Progress Notes (Signed)
 BP (!) 177/76   Pulse 88   Ht 5' 1 (1.549 m)   Wt 231 lb (104.8 kg)   LMP  (LMP Unknown) Comment: FULL   SpO2 98%   BMI 43.65 kg/m    Subjective:   Patient ID: Destiny [name], female    DOB: [birth year], 58 y.o.   MRN: [id number]  HPI: Destiny [name] is a 59 y.o. female presenting on 06/07/2023 for Medical Management of Chronic Issues, Anxiety, Shortness of Breath (Dx with Covid last Monday), Rectal Bleeding (BRB with clots), and Hypertension   HPI Hypertension Patient is currently on hydrochlorothiazide , and their blood pressure today is 177/76. Patient denies any lightheadedness or dizziness. Patient denies headaches, blurred vision, chest pains, shortness of breath, or weakness. Denies any side effects from medication and is content with current medication.   Hypothyroidism recheck Patient is coming in for thyroid  recheck today as well. They deny any issues with hair changes or heat or cold problems or diarrhea or constipation. They deny any chest pain or palpitations. They are currently on levothyroxine  200 micrograms   Anemia recheck Patient is coming in today for anemia recheck.  She feels fatigue now but recently had COVID.  She did have a little bit of rectal bleeding on Saturday and we will recheck her blood counts today.  Respiratory infection follow-up Patient was diagnosed 1 week ago with COVID and is having a lot of sinus congestion and cough and was treated for COVID with an antiviral and she finished that just 2 days ago.  Her cough and congestion is feeling a lot better but she is still having a lot of nasal congestion at nighttime and having some dizziness/balance disruption and decreased energy that is been going on since she was diagnosed with COVID.  She denies any fevers or chills.  She denies any shortness of breath, mainly she is dealing with the nasal congestion especially at night when she tries to lay down.  Relevant past medical, surgical, family and social history  reviewed and updated as indicated. Interim medical history since our last visit reviewed. Allergies and medications reviewed and updated.  Review of Systems  Constitutional:  Negative for chills and fever.  HENT:  Positive for congestion. Negative for ear discharge, ear pain and sinus pain.   Eyes:  Negative for visual disturbance.  Respiratory:  Positive for cough. Negative for chest tightness, shortness of breath and wheezing.   Cardiovascular:  Negative for chest pain and leg swelling.  Gastrointestinal:  Positive for anal bleeding. Negative for blood in stool, constipation, nausea, rectal pain and vomiting.  Genitourinary:  Negative for difficulty urinating and dysuria.  Skin:  Negative for rash.  Neurological:  Positive for dizziness. Negative for weakness, light-headedness and headaches.  Psychiatric/Behavioral:  Negative for agitation and behavioral problems.   All other systems reviewed and are negative.   Per HPI unless specifically indicated above   Allergies as of 06/07/2023       Reactions   Latex Itching, Rash        Medication List        Accurate as of June 07, 2023  2:57 PM. If you have any questions, ask your nurse or doctor.          STOP taking these medications    DULoxetine  30 MG capsule Commonly known as: Cymbalta  Stopped by: [facility] Kodi Guerrera   gabapentin  300 MG capsule Commonly known as: NEURONTIN  Stopped by: [facility] Daryle Amis  TAKE these medications    acetaminophen  500 MG tablet Commonly known as: TYLENOL  Take 1,000 mg by mouth every 8 (eight) hours as needed for moderate pain.   albuterol  108 (90 Base) MCG/ACT inhaler Commonly known as: VENTOLIN  HFA Inhale 1-2 puffs into the lungs every 6 (six) hours as needed for wheezing or shortness of breath.   ascorbic acid  500 MG tablet Commonly known as: VITAMIN C  Take 500 mg by mouth daily.   azelastine 0.05 % ophthalmic solution Commonly known as: OPTIVAR Apply 1 drop to eye  2 (two) times daily.   colestipol 1 g tablet Commonly known as: COLESTID Take by mouth.   cyclobenzaprine  10 MG tablet Commonly known as: FLEXERIL  TAKE 1 TABLET BY MOUTH AT BEDTIME AS NEEDED FOR MUSCLE SPASMS.   diclofenac  75 MG EC tablet Commonly known as: VOLTAREN  Take 1 tab twice daily for muscle and joint pain   dicyclomine  20 MG tablet Commonly known as: BENTYL  Take by mouth.   diphenoxylate -atropine  2.5-0.025 MG tablet Commonly known as: LOMOTIL  Take 2 tablets by mouth 4 (four) times daily as needed.   fluconazole  200 MG tablet Commonly known as: DIFLUCAN  Take 200 mg by mouth daily.   fluticasone  50 MCG/ACT nasal spray Commonly known as: FLONASE  PLACE 1 SPRAY INTO BOTH NOSTRILS 2 (TWO) TIMES DAILY   hydrochlorothiazide  25 MG tablet Commonly known as: HYDRODIURIL  Take 1 tablet (25 mg total) by mouth daily.   levothyroxine  200 MCG tablet Commonly known as: SYNTHROID  Take 1 tablet (200 mcg total) by mouth daily.   LORazepam  0.5 MG tablet Commonly known as: ATIVAN  TAKE 1 TABLET (0.5 MG) BY MOUTH AT BEDTIME AS NEEDED FOR ANXIETY   mirabegron  ER 25 MG Tb24 tablet Commonly known as: Myrbetriq  Take 1 tablet (25 mg total) by mouth daily.   multivitamin with minerals tablet Take 1 tablet by mouth daily.   naproxen sodium 220 MG tablet Commonly known as: ALEVE Take 220 mg by mouth daily as needed.   ondansetron  8 MG tablet Commonly known as: ZOFRAN  Take 1 tablet by mouth every 8 hours as needed for nausea or vomiting.   QC TUMERIC COMPLEX PO Take 1 capsule by mouth daily.   traZODone  100 MG tablet Commonly known as: DESYREL  Take 1 tablet (100 mg total) by mouth at bedtime.   vitamin B-12 100 MCG tablet Commonly known as: CYANOCOBALAMIN  Take 100 mcg by mouth daily.   VITAMIN D3 PO Take by mouth.   zinc  gluconate 50 MG tablet Take 50 mg by mouth daily.         Objective:   BP (!) 177/76   Pulse 88   Ht 5' 1 (1.549 m)   Wt 231 lb (104.8 kg)    LMP  (LMP Unknown) Comment: FULL   SpO2 98%   BMI 43.65 kg/m   Wt Readings from Last 3 Encounters:  06/07/23 231 lb (104.8 kg)  03/05/23 232 lb (105.2 kg)  02/18/23 227 lb 1.6 oz (103 kg)    Physical Exam Vitals and nursing note reviewed.  Constitutional:      General: She is not in acute distress.    Appearance: She is well-developed. She is not diaphoretic.  Eyes:     Conjunctiva/sclera: Conjunctivae normal.  Cardiovascular:     Rate and Rhythm: Normal rate and regular rhythm.     Heart sounds: Normal heart sounds. No murmur heard. Pulmonary:     Effort: Pulmonary effort is normal. No respiratory distress.     Breath sounds: Normal  breath sounds. No wheezing.  Musculoskeletal:        General: No tenderness. Normal range of motion.  Skin:    General: Skin is warm and dry.     Findings: No rash.  Neurological:     Mental Status: She is alert and oriented to person, place, and time.     Coordination: Coordination normal.  Psychiatric:        Behavior: Behavior normal.      Assessment & Plan:   Problem List Items Addressed This Visit       Cardiovascular and Mediastinum   Essential hypertension, benign   Relevant Orders   CBC with Differential/Platelet   CMP14+EGFR   Lipid panel   TSH   Fecal occult blood, imunochemical     Endocrine   Hypothyroidism - Primary   Relevant Orders   CBC with Differential/Platelet   CMP14+EGFR   Lipid panel   TSH   Fecal occult blood, imunochemical     Other   Anemia   Relevant Orders   Fecal occult blood, imunochemical   Other Visit Diagnoses       Rectal bleeding       Relevant Orders   CBC with Differential/Platelet   CMP14+EGFR   Fecal occult blood, imunochemical     Along with blood work and testing for anemia will do Hemoccult card to see if she is still having rectal bleeding.  Follow up plan: Return in about 3 months (around 09/05/2023), or if symptoms worsen or fail to improve, for Thyroid  and  hypertension and anemia recheck.  Counseling provided for all of the vaccine components Orders Placed This Encounter  Procedures   Fecal occult blood, imunochemical   CBC with Differential/Platelet   CMP14+EGFR   Lipid panel   TSH    Fonda Levins, MD [facility] 06/07/2023, 2:57 PM

## 2023-06-08 LAB — CBC WITH DIFFERENTIAL/PLATELET
Basophils Absolute: 0 10*3/uL (ref 0.0–0.2)
Basos: 0 %
EOS (ABSOLUTE): 0.4 10*3/uL (ref 0.0–0.4)
Eos: 4 %
Hematocrit: 37.9 % (ref 34.0–46.6)
Hemoglobin: 12.4 g/dL (ref 11.1–15.9)
Immature Grans (Abs): 0.1 10*3/uL (ref 0.0–0.1)
Immature Granulocytes: 1 %
Lymphocytes Absolute: 1.9 10*3/uL (ref 0.7–3.1)
Lymphs: 20 %
MCH: 29.5 pg (ref 26.6–33.0)
MCHC: 32.7 g/dL (ref 31.5–35.7)
MCV: 90 fL (ref 79–97)
Monocytes Absolute: 0.7 10*3/uL (ref 0.1–0.9)
Monocytes: 7 %
Neutrophils Absolute: 6.7 10*3/uL (ref 1.4–7.0)
Neutrophils: 68 %
Platelets: 403 10*3/uL (ref 150–450)
RBC: 4.21 x10E6/uL (ref 3.77–5.28)
RDW: 13 % (ref 11.7–15.4)
WBC: 9.8 10*3/uL (ref 3.4–10.8)

## 2023-06-08 LAB — TSH: TSH: 7.58 u[IU]/mL — ABNORMAL HIGH (ref 0.450–4.500)

## 2023-06-08 LAB — CMP14+EGFR
ALT: 12 IU/L (ref 0–32)
AST: 11 IU/L (ref 0–40)
Albumin: 4 g/dL (ref 3.8–4.9)
Alkaline Phosphatase: 79 IU/L (ref 44–121)
BUN/Creatinine Ratio: 27 — ABNORMAL HIGH (ref 9–23)
BUN: 21 mg/dL (ref 6–24)
Bilirubin Total: 0.2 mg/dL (ref 0.0–1.2)
CO2: 27 mmol/L (ref 20–29)
Calcium: 9.6 mg/dL (ref 8.7–10.2)
Chloride: 98 mmol/L (ref 96–106)
Creatinine, Ser: 0.77 mg/dL (ref 0.57–1.00)
Globulin, Total: 2.8 g/dL (ref 1.5–4.5)
Glucose: 106 mg/dL — ABNORMAL HIGH (ref 70–99)
Potassium: 4.1 mmol/L (ref 3.5–5.2)
Sodium: 140 mmol/L (ref 134–144)
Total Protein: 6.8 g/dL (ref 6.0–8.5)
eGFR: 90 mL/min/{1.73_m2} (ref >59)

## 2023-06-08 LAB — LIPID PANEL
Cholesterol, Total: 173 mg/dL (ref 100–199)
HDL: 64 mg/dL (ref >39)
LDL CALC COMMENT:: 2.7 ratio (ref 0.0–4.4)
LDL Chol Calc (NIH): 70 mg/dL (ref 0–99)
Triglycerides: 246 mg/dL — ABNORMAL HIGH (ref 0–149)
VLDL Cholesterol Cal: 39 mg/dL (ref 5–40)

## 2023-06-09 ENCOUNTER — Emergency Department (HOSPITAL_COMMUNITY): Payer: 59

## 2023-06-09 ENCOUNTER — Other Ambulatory Visit: Payer: Self-pay

## 2023-06-09 ENCOUNTER — Ambulatory Visit: Payer: Self-pay | Admitting: Family Medicine

## 2023-06-09 ENCOUNTER — Encounter (HOSPITAL_COMMUNITY): Payer: Self-pay

## 2023-06-09 ENCOUNTER — Observation Stay (HOSPITAL_COMMUNITY)
Admission: EM | Admit: 2023-06-09 | Discharge: 2023-06-10 | Disposition: A | Discharge status: Home / Self Care | Payer: 59 | Attending: Internal Medicine | Admitting: Internal Medicine

## 2023-06-09 DIAGNOSIS — R42 Dizziness and giddiness: Secondary | ICD-10-CM | POA: Diagnosis not present

## 2023-06-09 DIAGNOSIS — E1159 Type 2 diabetes mellitus with other circulatory complications: Secondary | ICD-10-CM | POA: Diagnosis present

## 2023-06-09 DIAGNOSIS — R531 Weakness: Secondary | ICD-10-CM | POA: Diagnosis not present

## 2023-06-09 DIAGNOSIS — I639 Cerebral infarction, unspecified: Secondary | ICD-10-CM | POA: Diagnosis not present

## 2023-06-09 DIAGNOSIS — I6302 Cerebral infarction due to thrombosis of basilar artery: Secondary | ICD-10-CM

## 2023-06-09 DIAGNOSIS — Z79899 Other long term (current) drug therapy: Secondary | ICD-10-CM | POA: Diagnosis not present

## 2023-06-09 DIAGNOSIS — E890 Postprocedural hypothyroidism: Secondary | ICD-10-CM

## 2023-06-09 DIAGNOSIS — E039 Hypothyroidism, unspecified: Secondary | ICD-10-CM | POA: Diagnosis not present

## 2023-06-09 DIAGNOSIS — I6522 Occlusion and stenosis of left carotid artery: Secondary | ICD-10-CM | POA: Diagnosis not present

## 2023-06-09 DIAGNOSIS — Z91040 Latex allergy status: Secondary | ICD-10-CM | POA: Insufficient documentation

## 2023-06-09 DIAGNOSIS — I6350 Cerebral infarction due to unspecified occlusion or stenosis of unspecified cerebral artery: Secondary | ICD-10-CM | POA: Diagnosis not present

## 2023-06-09 DIAGNOSIS — Z6841 Body Mass Index (BMI) 40.0 and over, adult: Secondary | ICD-10-CM | POA: Insufficient documentation

## 2023-06-09 DIAGNOSIS — R6889 Other general symptoms and signs: Secondary | ICD-10-CM | POA: Diagnosis not present

## 2023-06-09 DIAGNOSIS — C20 Malignant neoplasm of rectum: Secondary | ICD-10-CM | POA: Insufficient documentation

## 2023-06-09 DIAGNOSIS — I10 Essential (primary) hypertension: Secondary | ICD-10-CM | POA: Insufficient documentation

## 2023-06-09 DIAGNOSIS — K921 Melena: Secondary | ICD-10-CM | POA: Diagnosis not present

## 2023-06-09 DIAGNOSIS — R519 Headache, unspecified: Secondary | ICD-10-CM | POA: Diagnosis not present

## 2023-06-09 DIAGNOSIS — R197 Diarrhea, unspecified: Secondary | ICD-10-CM | POA: Diagnosis not present

## 2023-06-09 DIAGNOSIS — R7302 Impaired glucose tolerance (oral): Secondary | ICD-10-CM | POA: Diagnosis not present

## 2023-06-09 DIAGNOSIS — I6329 Cerebral infarction due to unspecified occlusion or stenosis of other precerebral arteries: Principal | ICD-10-CM

## 2023-06-09 DIAGNOSIS — I6381 Other cerebral infarction due to occlusion or stenosis of small artery: Secondary | ICD-10-CM | POA: Diagnosis not present

## 2023-06-09 DIAGNOSIS — R200 Anesthesia of skin: Secondary | ICD-10-CM | POA: Diagnosis not present

## 2023-06-09 DIAGNOSIS — F32A Depression, unspecified: Secondary | ICD-10-CM | POA: Diagnosis present

## 2023-06-09 DIAGNOSIS — Z743 Need for continuous supervision: Secondary | ICD-10-CM | POA: Diagnosis not present

## 2023-06-09 LAB — T4, FREE: Free T4: 0.96 ng/dL (ref 0.61–1.12)

## 2023-06-09 LAB — BASIC METABOLIC PANEL
Anion gap: 8 (ref 5–15)
BUN: 21 mg/dL — ABNORMAL HIGH (ref 6–20)
CO2: 28 mmol/L (ref 22–32)
Calcium: 8.9 mg/dL (ref 8.9–10.3)
Chloride: 100 mmol/L (ref 98–111)
Creatinine, Ser: 0.77 mg/dL (ref 0.44–1.00)
GFR, Estimated: >60 mL/min (ref >60)
Glucose, Bld: 91 mg/dL (ref 70–99)
Potassium: 3.8 mmol/L (ref 3.5–5.1)
Sodium: 136 mmol/L (ref 135–145)

## 2023-06-09 LAB — CBC
HCT: 37.3 % (ref 36.0–46.0)
Hemoglobin: 12.5 g/dL (ref 12.0–15.0)
MCH: 30.3 pg (ref 26.0–34.0)
MCHC: 33.5 g/dL (ref 30.0–36.0)
MCV: 90.5 fL (ref 80.0–100.0)
Platelets: 393 10*3/uL (ref 150–400)
RBC: 4.12 MIL/uL (ref 3.87–5.11)
RDW: 13.5 % (ref 11.5–15.5)
WBC: 9.2 10*3/uL (ref 4.0–10.5)
nRBC: 0 % (ref 0.0–0.2)

## 2023-06-09 LAB — TSH: TSH: 8.463 u[IU]/mL — ABNORMAL HIGH (ref 0.350–4.500)

## 2023-06-09 LAB — HEMOGLOBIN A1C
Hgb A1c MFr Bld: 6.3 % — ABNORMAL HIGH (ref 4.8–5.6)
Mean Plasma Glucose: 134.11 mg/dL

## 2023-06-09 LAB — CBG MONITORING, ED: Glucose-Capillary: 96 mg/dL (ref 70–99)

## 2023-06-09 MED ORDER — LORAZEPAM 0.5 MG PO TABS
0.5000 mg | ORAL_TABLET | Freq: Every evening | ORAL | Status: DC | PRN
  Administered 2023-06-09: 0.5 mg via ORAL
  Filled 2023-06-09: qty 1

## 2023-06-09 MED ORDER — POLYETHYLENE GLYCOL 3350 17 G PO PACK: 17.0000 g | PACK | Freq: Every day | ORAL | Status: DC | PRN

## 2023-06-09 MED ORDER — ORAL CARE MOUTH RINSE: 15.0000 mL | OROMUCOSAL | Status: DC | PRN

## 2023-06-09 MED ORDER — ONDANSETRON HCL 4 MG/2ML IJ SOLN: 4.0000 mg | Freq: Four times a day (QID) | INTRAMUSCULAR | Status: DC | PRN

## 2023-06-09 MED ORDER — ASPIRIN 81 MG PO TBEC
81.0000 mg | DELAYED_RELEASE_TABLET | Freq: Every day | ORAL | Status: DC
Start: 2023-06-09 — End: 2023-06-09

## 2023-06-09 MED ORDER — ASPIRIN 300 MG RE SUPP: 300.0000 mg | Freq: Every day | RECTAL | Status: DC

## 2023-06-09 MED ORDER — CYCLOBENZAPRINE HCL 10 MG PO TABS
10.0000 mg | ORAL_TABLET | Freq: Three times a day (TID) | ORAL | Status: DC | PRN
  Administered 2023-06-09: 10 mg via ORAL
  Filled 2023-06-09: qty 1

## 2023-06-09 MED ORDER — CLOPIDOGREL BISULFATE 75 MG PO TABS
300.0000 mg | ORAL_TABLET | Freq: Once | ORAL | Status: AC
  Administered 2023-06-09: 300 mg via ORAL
  Filled 2023-06-09: qty 4

## 2023-06-09 MED ORDER — KETOROLAC TROMETHAMINE 15 MG/ML IJ SOLN
15.0000 mg | Freq: Once | INTRAMUSCULAR | Status: AC
  Administered 2023-06-09: 15 mg via INTRAVENOUS
  Filled 2023-06-09: qty 1

## 2023-06-09 MED ORDER — STROKE: EARLY STAGES OF RECOVERY BOOK
Freq: Once | Status: DC
  Filled 2023-06-09: qty 1

## 2023-06-09 MED ORDER — PROCHLORPERAZINE EDISYLATE 10 MG/2ML IJ SOLN
10.0000 mg | Freq: Once | INTRAMUSCULAR | Status: AC
  Administered 2023-06-09: 10 mg via INTRAVENOUS
  Filled 2023-06-09: qty 2

## 2023-06-09 MED ORDER — FLUTICASONE PROPIONATE 50 MCG/ACT NA SUSP
1.0000 | Freq: Two times a day (BID) | NASAL | Status: DC
  Administered 2023-06-09 – 2023-06-10 (×2): 1 via NASAL
  Filled 2023-06-09: qty 16

## 2023-06-09 MED ORDER — ASPIRIN 325 MG PO TBEC
325.0000 mg | DELAYED_RELEASE_TABLET | Freq: Once | ORAL | Status: AC
  Administered 2023-06-09: 325 mg via ORAL
  Filled 2023-06-09: qty 1

## 2023-06-09 MED ORDER — LORAZEPAM 2 MG/ML IJ SOLN
0.5000 mg | Freq: Once | INTRAMUSCULAR | Status: DC
Start: 2023-06-09 — End: 2023-06-10

## 2023-06-09 MED ORDER — ACETAMINOPHEN 650 MG RE SUPP: 650.0000 mg | Freq: Four times a day (QID) | RECTAL | Status: DC | PRN

## 2023-06-09 MED ORDER — LEVOTHYROXINE SODIUM 100 MCG PO TABS
200.0000 ug | ORAL_TABLET | Freq: Every day | ORAL | Status: DC
  Administered 2023-06-10: 200 ug via ORAL
  Filled 2023-06-09: qty 2

## 2023-06-09 MED ORDER — ACETAMINOPHEN 325 MG PO TABS
650.0000 mg | ORAL_TABLET | Freq: Four times a day (QID) | ORAL | Status: DC | PRN
Start: 2023-06-09 — End: 2023-06-10

## 2023-06-09 MED ORDER — DIPHENHYDRAMINE HCL 50 MG/ML IJ SOLN
25.0000 mg | Freq: Once | INTRAMUSCULAR | Status: AC
  Administered 2023-06-09: 25 mg via INTRAVENOUS
  Filled 2023-06-09: qty 1

## 2023-06-09 MED ORDER — ONDANSETRON HCL 4 MG PO TABS: 4.0000 mg | ORAL_TABLET | Freq: Four times a day (QID) | ORAL | Status: DC | PRN

## 2023-06-09 MED ORDER — ENOXAPARIN SODIUM 40 MG/0.4ML IJ SOSY: 40.0000 mg | PREFILLED_SYRINGE | INTRAMUSCULAR | Status: DC

## 2023-06-09 MED ORDER — ASPIRIN 81 MG PO TBEC
81.0000 mg | DELAYED_RELEASE_TABLET | Freq: Every day | ORAL | Status: DC
  Administered 2023-06-10: 81 mg via ORAL
  Filled 2023-06-09: qty 1

## 2023-06-09 MED ORDER — HYDROCHLOROTHIAZIDE 25 MG PO TABS
25.0000 mg | ORAL_TABLET | Freq: Every day | ORAL | Status: DC
Start: 2023-06-10 — End: 2023-06-10

## 2023-06-09 MED ORDER — CLOPIDOGREL BISULFATE 75 MG PO TABS
75.0000 mg | ORAL_TABLET | Freq: Every day | ORAL | Status: DC
  Administered 2023-06-10: 75 mg via ORAL
  Filled 2023-06-09: qty 1

## 2023-06-09 MED ORDER — DICYCLOMINE HCL 20 MG PO TABS
20.0000 mg | ORAL_TABLET | Freq: Three times a day (TID) | ORAL | Status: DC
  Filled 2023-06-09 (×5): qty 1

## 2023-06-09 MED ORDER — ENOXAPARIN SODIUM 60 MG/0.6ML IJ SOSY
0.5000 mg/kg | PREFILLED_SYRINGE | INTRAMUSCULAR | Status: DC
  Administered 2023-06-09: 50 mg via SUBCUTANEOUS
  Filled 2023-06-09: qty 0.6

## 2023-06-09 MED ORDER — MIRABEGRON ER 25 MG PO TB24
25.0000 mg | ORAL_TABLET | Freq: Every day | ORAL | Status: DC
  Administered 2023-06-10: 25 mg via ORAL
  Filled 2023-06-09: qty 1

## 2023-06-09 MED ORDER — IOHEXOL 350 MG/ML SOLN
75.0000 mL | Freq: Once | INTRAVENOUS | Status: AC | PRN
  Administered 2023-06-09: 75 mL via INTRAVENOUS

## 2023-06-09 MED ORDER — DEXAMETHASONE SODIUM PHOSPHATE 10 MG/ML IJ SOLN
10.0000 mg | Freq: Once | INTRAMUSCULAR | Status: AC
  Administered 2023-06-09: 10 mg via INTRAVENOUS
  Filled 2023-06-09: qty 1

## 2023-06-09 NOTE — ED Notes (Signed)
 Code stroke called by Dr. Andria Meuse EDP.

## 2023-06-09 NOTE — Consult Note (Signed)
 Code stroke activated at 1525. Pt in ED for HTN and HA. L side weakness and numbness and slurred speech began about 1510.   Dr Lindzen on another code stroke and requested this case go to back up neurologist. Dr Jerri paged at [phone] and was on camera at 1530. Pt also left for CT at this time.   Returned from CT at 1544. Dr Jerri examined pt in ED at this time.   Off camera at 1556.  Gwenyth Ina, Telestroke RN

## 2023-06-09 NOTE — Consult Note (Signed)
 [facility] Telemedicine Consult   Requesting Provider: Dr. Albertina  Chief Complaint: imbalance, headache and high BP  HPI: 58 yo F with history of hypertension, hyperthyroidism status post radioactive iodine 2017 and now hypothyroidism, rectal cancer stage III status post surgery and chemo, recent COVID infection 10 days ago presented to ED for imbalance, headache and high blood pressure.  Patient stated that she had a COVID 10 days ago, had cough and headache but not much sore throat.  She recovered well.  Doing well until Monday morning she woke up with imbalance walking, stumbles around, sometimes need to hold onto things for walking.  Denies vertigo but feeling some lightheadedness with walking.  Has been having high blood pressure for the last 2 3 days.  This morning she started have headache and called her PCP/triage nurse and was directed to the ED.  ED BP 190/110, glucose 96.  CT head no acute abnormality.  Around 1525 she complained left arm numbness.  RN found she also had left hand grip weakness and some left leg drift.  Also felt some slurred speech.  Code stroke activated.  CT head repeat no acute abnormality.  CT head and neck no LVO.  On my exam, patient AOx3, no focal deficit.  RN reported patient symptoms seems improved.  LKW: Sunday night before went to bed tpa given?: No, outside window, NIHSS 0 on exam IR Thrombectomy? No, no LVO Modified Rankin Scale: 0-Completely asymptomatic and back to baseline post- stroke   Exam: Vitals:   06/09/23 1445 06/09/23 1515  BP: (!) 167/122 (!) 169/96  Pulse: 81 80  Resp: 15 20  Temp:    SpO2: 95% 94%     Temp:  [98 F (36.7 C)] 98 F (36.7 C) (01/08 1235) Pulse Rate:  [80-84] 80 (01/08 1515) Resp:  [15-20] 20 (01/08 1515) BP: (166-169)/(91-122) 169/96 (01/08 1515) SpO2:  [93 %-95 %] 94 % (01/08 1515) Weight:  [102.1 kg] 102.1 kg (01/08 1236)  General - Well nourished, well developed, in no apparent distress, but  intermittent restless asking for ice water and going to bathroom.  Ophthalmologic - fundi not visualized due to noncooperation.  Cardiovascular - Regular rhythm and rate.  Neuro - awake, alert, eyes open, orientated to age, place, time. No aphasia, fluent language, following all simple commands. Able to name and repeat and read.  No significant dysarthria.  No gaze palsy, tracking bilaterally, visual field full. No facial droop. Tongue midline. Bilateral UEs 5/5, no drift.  Finger grip equal bilaterally.  Bilaterally LEs 5/5, no drift. Sensation symmetrical bilaterally, b/l FTN intact, gait not tested.   NIH Stroke Scale = 0  Imaging Reviewed:  CT ANGIO HEAD NECK W WO CM (CODE STROKE) Result Date: 06/09/2023 CLINICAL DATA:  Stroke, follow-up. Left arm and leg numbness. Dizziness. EXAM: CT ANGIOGRAPHY HEAD AND NECK WITH AND WITHOUT CONTRAST TECHNIQUE: Multidetector CT imaging of the head and neck was performed using the standard protocol during bolus administration of intravenous contrast. Multiplanar CT image reconstructions and MIPs were obtained to evaluate the vascular anatomy. Carotid stenosis measurements (when applicable) are obtained utilizing NASCET criteria, using the distal internal carotid diameter as the denominator. RADIATION DOSE REDUCTION: This exam was performed according to the departmental dose-optimization program which includes automated exposure control, adjustment of the mA and/or kV according to patient size and/or use of iterative reconstruction technique. CONTRAST:  75mL OMNIPAQUE  IOHEXOL  350 MG/ML SOLN COMPARISON:  CT head without contrast 06/09/2023 FINDINGS: CTA NECK FINDINGS Aortic arch: A  3 vessel arch configuration is present. No significant atherosclerotic changes are present. No aneurysm or dissection is present. Right carotid system: Right common carotid artery is within normal limits. The bifurcation is unremarkable. The cervical right ICA is normal. Left carotid system:  The left carotid artery is within normal limits. Bifurcation is unremarkable. The cervical left ICA is normal. Vertebral arteries: The vertebral arteries are codominant. Both vertebral arteries originate from the subclavian arteries without significant stenosis. No significant stenosis is present in either vertebral artery in the neck. Skeleton: The vertebral body heights and alignment are normal. No focal osseous lesions are present. The patient is edentulous. Other neck: Soft tissues the neck are otherwise unremarkable. Salivary glands are within normal limits. Thyroid  is normal. No significant adenopathy is present. No focal mucosal or submucosal lesions are present. Upper chest: The lung apices are clear. The thoracic inlet is within normal limits. Review of the MIP images confirms the above findings CTA HEAD FINDINGS Anterior circulation: Atherosclerotic calcifications are present within the cavernous left internal carotid artery. No significant stenosis is present in either internal carotid artery through the ICA termini. The right A1 segment is dominant. Hypoplastic left A1 segment is present. The anterior communicating artery is patent. M1 segments are normal bilaterally. The MCA bifurcations are within normal limits bilaterally. Mild distal segmental narrowing is present within ACA and MCA branch vessels without a significant proximal stenosis or occlusion. No aneurysm is present. Posterior circulation: The PICA origins are visualized and normal. The vertebrobasilar junction basilar artery normal. The superior cerebellar arteries are patent. Both posterior cerebral arteries originate from basilar tip. The PCA branch vessels are normal bilaterally. Venous sinuses: The dural sinuses are patent. The straight sinus and deep cerebral veins are intact. Cortical veins are within normal limits. No significant vascular malformation is evident. Anatomic variants: None Review of the MIP images confirms the above  findings IMPRESSION: 1. No emergent large vessel occlusion. 2. Mild distal segmental narrowing within ACA and MCA branch vessels without a significant proximal stenosis or occlusion. This likely reflects intracranial atherosclerosis. 3. Normal CTA of the neck. Electronically Signed   By: Lonni Necessary M.D.   On: 06/09/2023 15:56   CT HEAD CODE STROKE WO CONTRAST Result Date: 06/09/2023 CLINICAL DATA:  Code stroke. Dizziness and headache for 2 days. Left-sided weakness began today. EXAM: CT HEAD WITHOUT CONTRAST TECHNIQUE: Contiguous axial images were obtained from the base of the skull through the vertex without intravenous contrast. RADIATION DOSE REDUCTION: This exam was performed according to the departmental dose-optimization program which includes automated exposure control, adjustment of the mA and/or kV according to patient size and/or use of iterative reconstruction technique. COMPARISON:  CT head without contrast 06/09/2023 at 2:39 p.m. FINDINGS: Brain: No acute infarct, hemorrhage, or mass lesion is present. No significant white matter lesions are present. Deep brain nuclei are within normal limits. The ventricles are of normal size. No significant extraaxial fluid collection is present. The brainstem and cerebellum are within normal limits. Midline structures are within normal limits. Vascular: Atherosclerotic calcifications are present within the left cavernous internal carotid artery. No hyperdense vessel is present. Skull: Calvarium is intact. No focal lytic or blastic lesions are present. No significant extracranial soft tissue lesion is present. Sinuses/Orbits: Minimal fluid is again noted in the right maxillary sinus. The paranasal sinuses and mastoid air cells are otherwise clear. Bilateral lens replacements are noted. Globes and orbits are otherwise unremarkable. ASPECTS [facility] Stroke Program Early CT Score) - Ganglionic level infarction (caudate,  lentiform nuclei, internal capsule,  insula, M1-M3 cortex): 7/7 - Supraganglionic infarction (M4-M6 cortex): 3/3 Total score (0-10 with 10 being normal): 10/10 IMPRESSION: 1. Normal CT appearance of the brain. No acute or focal lesion to explain the patient's symptoms. 2. Aspects is 10/10. 3. Minimal right maxillary sinus disease. These results were called by telephone at the time of interpretation on 06/09/2023 at 3:49 pm to provider Dr. Mannie, who verbally acknowledged these results. Electronically Signed   By: Lonni Necessary M.D.   On: 06/09/2023 15:50   CT Head Wo Contrast Result Date: 06/09/2023 CLINICAL DATA:  New onset headache.  Hypertension.  Dizziness. EXAM: CT HEAD WITHOUT CONTRAST TECHNIQUE: Contiguous axial images were obtained from the base of the skull through the vertex without intravenous contrast. RADIATION DOSE REDUCTION: This exam was performed according to the departmental dose-optimization program which includes automated exposure control, adjustment of the mA and/or kV according to patient size and/or use of iterative reconstruction technique. COMPARISON:  10/20/2016 FINDINGS: Brain: The brain shows a normal appearance without evidence of malformation, atrophy, old or acute small or large vessel infarction, mass lesion, hemorrhage, hydrocephalus or extra-axial collection. Vascular: No hyperdense vessel. No evidence of atherosclerotic calcification. Skull: Normal.  No traumatic finding.  No focal bone lesion. Sinuses/Orbits: Clear sinuses except for a small air-fluid level and mild mucosal thickening in the right maxillary sinus. Other: None significant IMPRESSION: 1. Normal appearance of the brain itself. 2. Small air-fluid level and mild mucosal thickening in the right maxillary sinus. Correlate clinically for any signs or symptoms of acute sinusitis. Electronically Signed   By: Oneil Officer M.D.   On: 06/09/2023 14:53     Labs reviewed in epic and pertinent values follow: Creatinine 0.77, platelet  393  Assessment: 58 yo F with history of hypertension, hyperthyroidism status post radioactive iodine 2017 and now hypothyroidism, rectal cancer stage III status post surgery and chemo, recent COVID infection 10 days ago presented to ED for imbalance since Monday, high blood pressure for the last 2 days and HA since this morning. ED BP 190/110, glucose 96.  CT head no acute abnormality.  Around 1525 she complained left arm numbness.  RN found she also had left hand grip weakness and some left leg drift.  Also felt some slurred speech.  Code stroke activated.  CT head repeat no acute abnormality.  CT head and neck no LVO.  On my exam, patient AOx3, no focal deficit.  NIHSS = 0. RN reported patient symptoms seems improved. Etiology likely complicated migraine vs. Hypertensive encephalopathy vs. Anxiety. Will recommend further stroke work up including MRI and echo.   Recommendations:  Continue further stroke work up  Frequent neuro checks Telemetry monitoring MRI brain  Echocardiogram  UDS, fasting lipid panel and HgbA1C Check TSH and FT4 given history PT/OT/speech consult Permissive hypertension (only treat if BP > 220/120 unless a lower blood pressure is clinically necessary) for 24 hours post stroke/TIA onset GI and DVT prophylaxis  ASA PR 300 mg if no po access or ASA 81 mg if passed swallow screening  Stroke risk factor modification Discussed with Smoot PA Will follow  Consult Participants: RN, patient, me and stroke response RN Location of the provider: [facility] Location of the patient: [facility]  Time Code Stroke Page received:  3:50 pm Time neurologist arrived:  3:51 Time NIHSS completed: 4:10    This consult was provided via telemedicine with 2-way video and audio communication. The patient/family was informed that care would be provided in  this way and agreed to receive care in this manner.   This patient is receiving care for possible acute neurological changes. There was 60 minutes of  care by this provider at the time of service, including time for direct evaluation via telemedicine, review of medical records, imaging studies and discussion of findings with providers, the patient and/or family.  Ary Cummins, MD PhD Stroke Neurology 06/09/2023 3:59 PM

## 2023-06-09 NOTE — ED Notes (Signed)
 Upon return from CT scan, tele neuro performed assessment. Patient's symptoms resolved. See updated NIH.

## 2023-06-09 NOTE — Progress Notes (Signed)
 PHARMACIST - PHYSICIAN COMMUNICATION  CONCERNING:  Enoxaparin  (Lovenox ) for DVT Prophylaxis    RECOMMENDATION: Patient was prescribed enoxaprin 40mg  q24 hours for VTE prophylaxis.   Filed Weights   06/09/23 1236  Weight: 102.1 kg (225 lb)    Body mass index is 42.51 kg/m.  Estimated Creatinine Clearance: 85.1 mL/min (by C-G formula based on SCr of 0.77 mg/dL).   Based on [facility] policy patient is candidate for enoxaparin  0.5mg /kg TBW SQ every 24 hours based on BMI being >30.  DESCRIPTION: Pharmacy has adjusted enoxaparin  dose per [facility] policy.  Patient is now receiving enoxaparin  50 mg every 24 hours    Lum VEAR Mania, PharmD Clinical Pharmacist  06/09/2023 7:13 PM

## 2023-06-09 NOTE — Assessment & Plan Note (Signed)
 Elevated blood pressure systolic up to 160s. - Hydrochlorothiazide and resume in a.m. - PRN labetalol 10mg  for systolic greater than 200

## 2023-06-09 NOTE — Assessment & Plan Note (Addendum)
 Presenting with headache, dizziness.  Reported slurred speech, left arm grip weakness, left leg drip in ED. on my evaluation, she has 5 of 5 strength to bilateral upper and lower extremities, speech is fluent without evidence of aphasia, patient tells me her speech is at baseline and denies abnormal speech.  Code stroke called.  Head CT without acute abnormality.  - MRI brain without contrast- acute infarct in the left pons, also middle cerebellar peduncle sign favoring subacute infarcts, wallerian degeneration after pontine infarct.  Atypical press also listed in body of report. - Evaluated by teleneurologist Dr. Elodie admission for stroke workup, feels findings are more likely subacute stroke probably 2 days ago. -Per neurologist start aspirin , Plavix , stroke workup -Echocardiogram -Lipid panel, A1c -CTA neck obtained carotid Dopplers deferred -Passed swallow eval -PT, speech therapy eval -Code Stroke called, evaluated by teleneurologist -Allow for permissive hypertension tonight, resume home antihypertensives in a.m.

## 2023-06-09 NOTE — Assessment & Plan Note (Signed)
Resume Synthroid ?

## 2023-06-09 NOTE — ED Provider Notes (Signed)
 [facility] EMERGENCY DEPARTMENT AT [facility] Provider Note   CSN: [id number] Arrival date & time: 06/09/23  1220     History  Chief Complaint  Patient presents with   Hypertension   Dizziness    Destiny [name] is a 58 y.o. female.  Patient with history of hypertension presents today with complaints of hypertension, dizziness. Patient states that she had COVID last week, took paxlovid and felt better until Monday when she noticed she 'just didn't feel right.' She notes that her feet feel heavy and she feels like simple movements require more effort than they did previously. This began Monday morning and has been persistent. Denies history of similar symptoms previously. She went to her pcp and was told that her blood pressure was high and to just monitor her blood pressures at home and check back at a follow-up appointment. Since then her blood pressures have continued to run higher than normal running consistently at greater than 160 over greater than 110. She has been complaint with her home blood pressure medications. Earlier today she did a phone visit with the triage nurse who recommended she come here for evaluation. She presents today for same. She denies chest pain or shortness of breath, does endorse headache that is frontal in nature and feels like pressure. Denies any changes in her vision, unilateral weakness or numbness/tingling. No room spinning sensation or diplopia. No nausea or vomiting. She is able to walk without assistance.  The history is provided by the patient. No language interpreter was used.  Hypertension Associated symptoms include headaches.  Dizziness Associated symptoms: headaches        Home Medications Prior to Admission medications   Medication Sig Start Date End Date Taking? Authorizing Provider  acetaminophen  (TYLENOL ) 500 MG tablet Take 1,000 mg by mouth every 8 (eight) hours as needed for moderate pain.    [provider]   albuterol  (VENTOLIN  HFA) 108 (90 Base) MCG/ACT inhaler Inhale 1-2 puffs into the lungs every 6 (six) hours as needed for wheezing or shortness of breath. 03/16/22   Triplett, Tammy, PA-C  azelastine (OPTIVAR) 0.05 % ophthalmic solution Apply 1 drop to eye 2 (two) times daily. 01/30/21   [provider]  Cholecalciferol (VITAMIN D3 PO) Take by mouth.    [provider]  colestipol (COLESTID) 1 g tablet Take by mouth. 04/21/21   [provider]  cyclobenzaprine  (FLEXERIL ) 10 MG tablet TAKE 1 TABLET BY MOUTH AT BEDTIME AS NEEDED FOR MUSCLE SPASMS. 04/22/23   Dettinger, Fonda LABOR, MD  diclofenac  (VOLTAREN ) 75 MG EC tablet Take 1 tab twice daily for muscle and joint pain 04/23/20   Dettinger, Fonda LABOR, MD  dicyclomine  (BENTYL ) 20 MG tablet Take by mouth. 05/29/21 02/18/23  [provider]  diphenoxylate -atropine  (LOMOTIL ) 2.5-0.025 MG tablet Take 2 tablets by mouth 4 (four) times daily as needed. 05/27/21   [provider]  fluconazole  (DIFLUCAN ) 200 MG tablet Take 200 mg by mouth daily. 05/27/21   [provider]  fluticasone  (FLONASE ) 50 MCG/ACT nasal spray PLACE 1 SPRAY INTO BOTH NOSTRILS 2 (TWO) TIMES DAILY 01/18/23   Dettinger, Fonda LABOR, MD  hydrochlorothiazide  (HYDRODIURIL ) 25 MG tablet Take 1 tablet (25 mg total) by mouth daily. 12/02/22   Dettinger, Fonda LABOR, MD  levothyroxine  (SYNTHROID ) 200 MCG tablet Take 1 tablet (200 mcg total) by mouth daily. 12/04/22   Dettinger, Fonda LABOR, MD  LORazepam  (ATIVAN ) 0.5 MG tablet TAKE 1 TABLET (0.5 MG) BY MOUTH AT BEDTIME AS  NEEDED FOR ANXIETY 12/02/22   Dettinger, Fonda LABOR, MD  mirabegron  ER (MYRBETRIQ ) 25 MG TB24 tablet Take 1 tablet (25 mg total) by mouth daily. 06/07/23   Dettinger, Fonda LABOR, MD  Multiple Vitamins-Minerals (MULTIVITAMIN WITH MINERALS) tablet Take 1 tablet by mouth daily.    [provider]  naproxen sodium (ALEVE) 220 MG tablet Take 220 mg by mouth daily as needed.    [provider]  ondansetron  (ZOFRAN ) 8 MG tablet Take 1 tablet by mouth every 8 hours as needed for nausea or vomiting. 06/09/17   Cloretta Arley NOVAK, MD  traZODone  (DESYREL ) 100 MG tablet Take 1 tablet (100 mg total) by mouth at bedtime. 06/09/21   Dettinger, Fonda LABOR, MD  Turmeric (QC TUMERIC COMPLEX PO) Take 1 capsule by mouth daily.    [provider]  vitamin B-12 (CYANOCOBALAMIN ) 100 MCG tablet Take 100 mcg by mouth daily.    [provider]  vitamin C  (ASCORBIC ACID ) 500 MG tablet Take 500 mg by mouth daily.    [provider]  zinc  gluconate 50 MG tablet Take 50 mg by mouth daily.    [provider]      Allergies    Latex    Review of Systems   Review of Systems  Neurological:  Positive for dizziness and headaches.  All other systems reviewed and are negative.   Physical Exam Updated Vital Signs BP (!) 169/96   Pulse 80   Temp 98 F (36.7 C) (Oral)   Resp 20   Ht 5' 1 (1.549 m)   Wt 102.1 kg   LMP  (LMP Unknown) Comment: FULL   SpO2 94%   BMI 42.51 kg/m  Physical Exam Vitals and nursing note reviewed.  Constitutional:      General: She is not in acute distress.    Appearance: Normal appearance. She is normal weight. She is not ill-appearing, toxic-appearing or diaphoretic.  HENT:     Head: Normocephalic and atraumatic.  Neck:     Comments: No meningismus Cardiovascular:     Rate and Rhythm: Normal rate and regular rhythm.     Heart sounds: Normal heart sounds.  Pulmonary:     Effort: Pulmonary effort is normal. No respiratory distress.     Breath sounds: Normal breath sounds.  Abdominal:     General: Abdomen is flat.     Palpations: Abdomen is soft.     Tenderness: There is no abdominal tenderness.  Musculoskeletal:        General: Normal range of motion.     Cervical back: Normal range of motion and neck supple.     Right lower leg: No edema.     Left lower leg: No edema.  Skin:    General: Skin is warm and dry.  Neurological:      General: No focal deficit present.     Mental Status: She is alert and oriented to person, place, and time.     GCS: GCS eye subscore is 4. GCS verbal subscore is 5. GCS motor subscore is 6.     Sensory: Sensation is intact.     Motor: Motor function is intact.     Coordination: Coordination is intact.     Gait: Gait is intact.     Comments: Alert and oriented to self, place, time and event.    Speech is fluent, clear without dysarthria or dysphasia.    Strength 5/5 in upper/lower extremities   Sensation intact in upper/lower extremities  Patient observed to be ambulatory with steady gait  Normal finger-to-nose and heel-to-shin testing   CN I not tested  CN II grossly intact visual fields bilaterally. Did not visualize posterior eye.  CN III, IV, VI PERRLA and EOMs intact bilaterally  CN V Intact sensation to sharp and light touch to the face  CN VII facial movements symmetric  CN VIII not tested  CN IX, X no uvula deviation, symmetric rise of soft palate  CN XI 5/5 SCM and trapezius strength bilaterally  CN XII Midline tongue protrusion, symmetric L/R movements   Psychiatric:        Mood and Affect: Mood normal.        Behavior: Behavior normal.     ED Results / Procedures / Treatments   Labs (all labs ordered are listed, but only abnormal results are displayed) Labs Reviewed  BASIC METABOLIC PANEL - Abnormal; Notable for the following components:      Result Value   BUN 21 (*)    All other components within normal limits  TSH - Abnormal; Notable for the following components:   TSH 8.463 (*)    All other components within normal limits  CBC  HEMOGLOBIN A1C  T4, FREE  URINE DRUGS OF ABUSE SCREEN W ALC, ROUTINE (REF LAB)  LIPID PANEL  HIV ANTIBODY (ROUTINE TESTING W REFLEX)  CBG MONITORING, ED    EKG None  Radiology MR BRAIN WO CONTRAST Result Date: 06/09/2023 CLINICAL DATA:  Stroke suspected EXAM: MRI HEAD WITHOUT CONTRAST TECHNIQUE: Multiplanar, multiecho  pulse sequences of the brain and surrounding structures were obtained without intravenous contrast. COMPARISON:  No prior MRI available, correlation is made with 06/09/2023 CT head FINDINGS: Brain: Restricted diffusion with ADC correlate in the left pons (series 5, images 14-15), which is associated with increased T2 hyperintense signal. Additional increased signal on diffusion-weighted imaging and T2 weighted sequences without definite ADC correlate in the bilateral middle cerebellar peduncles (series 5, image 12-13). No acute hemorrhage, mass, mass effect, or midline shift. No hydrocephalus or extra-axial collection. Pituitary and craniocervical junction within normal limits. Foci of hemosiderin deposition in the pons, likely sequela of remote hypertensive microhemorrhages. Scattered T2 hyperintense signal in the periventricular white matter, likely the sequela of mild chronic small vessel ischemic disease. Vascular: Normal arterial flow voids. Skull and upper cervical spine: Normal marrow signal. Sinuses/Orbits: Mucosal thickening in the right maxillary sinus with air-fluid level. No acute finding in the orbits. Status post bilateral lens replacements. No acute finding in the orbits. Other: The mastoid air cells are well aerated. IMPRESSION: 1. Acute infarct in the left pons. 2. Additional increased signal in the bilateral middle cerebellar peduncles on diffusion-weighted imaging and T2 weighted sequences without definite ADC correlate. This middle cerebellar peduncle sign  is nonspecific and favored to represent subacute infarcts or wallerian degeneration after pontine infarct, although the differential is broad and contains metabolic, neoplastic and inflammatory conditions, among others. The arc sign, with increased T2 signal extending from one middle cerebellar peduncle across the pons into the other, can be seen in CARASIL. Atypical PRES is also a consideration. 3. Mucosal thickening in the right  maxillary sinus with air-fluid level, which can be seen in the setting of acute sinusitis. These findings were discussed by telephone on 06/09/2023 at 5:13 pm with provider [facility]. Electronically Signed   By: Donald Campion M.D.   On: 06/09/2023 17:14   CT ANGIO HEAD NECK W WO CM (CODE STROKE) Result Date: 06/09/2023 CLINICAL DATA:  Stroke, follow-up. Left arm and leg numbness. Dizziness. EXAM: CT ANGIOGRAPHY HEAD AND NECK WITH AND WITHOUT CONTRAST TECHNIQUE: Multidetector CT imaging of the head and neck was performed using the standard protocol during bolus administration of intravenous contrast. Multiplanar CT image reconstructions and MIPs were obtained to evaluate the vascular anatomy. Carotid stenosis measurements (when applicable) are obtained utilizing NASCET criteria, using the distal internal carotid diameter as the denominator. RADIATION DOSE REDUCTION: This exam was performed according to the departmental dose-optimization program which includes automated exposure control, adjustment of the mA and/or kV according to patient size and/or use of iterative reconstruction technique. CONTRAST:  75mL OMNIPAQUE  IOHEXOL  350 MG/ML SOLN COMPARISON:  CT head without contrast 06/09/2023 FINDINGS: CTA NECK FINDINGS Aortic arch: A 3 vessel arch configuration is present. No significant atherosclerotic changes are present. No aneurysm or dissection is present. Right carotid system: Right common carotid artery is within normal limits. The bifurcation is unremarkable. The cervical right ICA is normal. Left carotid system: The left carotid artery is within normal limits. Bifurcation is unremarkable. The cervical left ICA is normal. Vertebral arteries: The vertebral arteries are codominant. Both vertebral arteries originate from the subclavian arteries without significant stenosis. No significant stenosis is present in either vertebral artery in the neck. Skeleton: The vertebral body heights and alignment are normal. No  focal osseous lesions are present. The patient is edentulous. Other neck: Soft tissues the neck are otherwise unremarkable. Salivary glands are within normal limits. Thyroid  is normal. No significant adenopathy is present. No focal mucosal or submucosal lesions are present. Upper chest: The lung apices are clear. The thoracic inlet is within normal limits. Review of the MIP images confirms the above findings CTA HEAD FINDINGS Anterior circulation: Atherosclerotic calcifications are present within the cavernous left internal carotid artery. No significant stenosis is present in either internal carotid artery through the ICA termini. The right A1 segment is dominant. Hypoplastic left A1 segment is present. The anterior communicating artery is patent. M1 segments are normal bilaterally. The MCA bifurcations are within normal limits bilaterally. Mild distal segmental narrowing is present within ACA and MCA branch vessels without a significant proximal stenosis or occlusion. No aneurysm is present. Posterior circulation: The PICA origins are visualized and normal. The vertebrobasilar junction basilar artery normal. The superior cerebellar arteries are patent. Both posterior cerebral arteries originate from basilar tip. The PCA branch vessels are normal bilaterally. Venous sinuses: The dural sinuses are patent. The straight sinus and deep cerebral veins are intact. Cortical veins are within normal limits. No significant vascular malformation is evident. Anatomic variants: None Review of the MIP images confirms the above findings IMPRESSION: 1. No emergent large vessel occlusion. 2. Mild distal segmental narrowing within ACA and MCA branch vessels without a significant proximal stenosis or occlusion. This likely reflects intracranial atherosclerosis. 3. Normal CTA of the neck. Electronically Signed   By: Lonni Necessary M.D.   On: 06/09/2023 15:56   CT HEAD CODE STROKE WO CONTRAST Result Date: 06/09/2023 CLINICAL  DATA:  Code stroke. Dizziness and headache for 2 days. Left-sided weakness began today. EXAM: CT HEAD WITHOUT CONTRAST TECHNIQUE: Contiguous axial images were obtained from the base of the skull through the vertex without intravenous contrast. RADIATION DOSE REDUCTION: This exam was performed according to the departmental dose-optimization program which includes automated exposure control, adjustment of the mA and/or kV according to patient size and/or use of iterative reconstruction technique. COMPARISON:  CT head without contrast 06/09/2023 at 2:39 p.m. FINDINGS: Brain: No acute infarct, hemorrhage,  or mass lesion is present. No significant white matter lesions are present. Deep brain nuclei are within normal limits. The ventricles are of normal size. No significant extraaxial fluid collection is present. The brainstem and cerebellum are within normal limits. Midline structures are within normal limits. Vascular: Atherosclerotic calcifications are present within the left cavernous internal carotid artery. No hyperdense vessel is present. Skull: Calvarium is intact. No focal lytic or blastic lesions are present. No significant extracranial soft tissue lesion is present. Sinuses/Orbits: Minimal fluid is again noted in the right maxillary sinus. The paranasal sinuses and mastoid air cells are otherwise clear. Bilateral lens replacements are noted. Globes and orbits are otherwise unremarkable. ASPECTS [facility] Stroke Program Early CT Score) - Ganglionic level infarction (caudate, lentiform nuclei, internal capsule, insula, M1-M3 cortex): 7/7 - Supraganglionic infarction (M4-M6 cortex): 3/3 Total score (0-10 with 10 being normal): 10/10 IMPRESSION: 1. Normal CT appearance of the brain. No acute or focal lesion to explain the patient's symptoms. 2. Aspects is 10/10. 3. Minimal right maxillary sinus disease. These results were called by telephone at the time of interpretation on 06/09/2023 at 3:49 pm to provider Dr.  Mannie, who verbally acknowledged these results. Electronically Signed   By: Lonni Necessary M.D.   On: 06/09/2023 15:50   CT Head Wo Contrast Result Date: 06/09/2023 CLINICAL DATA:  New onset headache.  Hypertension.  Dizziness. EXAM: CT HEAD WITHOUT CONTRAST TECHNIQUE: Contiguous axial images were obtained from the base of the skull through the vertex without intravenous contrast. RADIATION DOSE REDUCTION: This exam was performed according to the departmental dose-optimization program which includes automated exposure control, adjustment of the mA and/or kV according to patient size and/or use of iterative reconstruction technique. COMPARISON:  10/20/2016 FINDINGS: Brain: The brain shows a normal appearance without evidence of malformation, atrophy, old or acute small or large vessel infarction, mass lesion, hemorrhage, hydrocephalus or extra-axial collection. Vascular: No hyperdense vessel. No evidence of atherosclerotic calcification. Skull: Normal.  No traumatic finding.  No focal bone lesion. Sinuses/Orbits: Clear sinuses except for a small air-fluid level and mild mucosal thickening in the right maxillary sinus. Other: None significant IMPRESSION: 1. Normal appearance of the brain itself. 2. Small air-fluid level and mild mucosal thickening in the right maxillary sinus. Correlate clinically for any signs or symptoms of acute sinusitis. Electronically Signed   By: Oneil Officer M.D.   On: 06/09/2023 14:53    Procedures .Critical Care  Performed by: Nora Lauraine LABOR, PA-C Authorized by: Donzell Coller A, PA-C   Critical care provider statement:    Critical care time (minutes):  35   Critical care was necessary to treat or prevent imminent or life-threatening deterioration of the following conditions:  Circulatory failure (acute stroke)   Critical care was time spent personally by me on the following activities:  Development of treatment plan with patient or surrogate, discussions with consultants,  discussions with primary provider, evaluation of patient's response to treatment, examination of patient, obtaining history from patient or surrogate, ordering and review of laboratory studies, ordering and review of radiographic studies, pulse oximetry, re-evaluation of patient's condition and review of old charts   Care discussed with: admitting provider       Medications Ordered in ED Medications  LORazepam  (ATIVAN ) injection 0.5 mg (has no administration in time range)   stroke: early stages of recovery book (has no administration in time range)  aspirin  EC tablet 81 mg (has no administration in time range)    Or  aspirin  suppository 300  mg (has no administration in time range)  prochlorperazine  (COMPAZINE ) injection 10 mg (10 mg Intravenous Given 06/09/23 1512)  diphenhydrAMINE  (BENADRYL ) injection 25 mg (25 mg Intravenous Given 06/09/23 1512)  dexamethasone  (DECADRON ) injection 10 mg (10 mg Intravenous Given 06/09/23 1512)  ketorolac  (TORADOL ) 15 MG/ML injection 15 mg (15 mg Intravenous Given 06/09/23 1512)  iohexol  (OMNIPAQUE ) 350 MG/ML injection 75 mL (75 mLs Intravenous Contrast Given 06/09/23 1533)    ED Course/ Medical Decision Making/ A&P                                 Medical Decision Making Amount and/or Complexity of Data Reviewed Labs: ordered. Radiology: ordered.  Risk Prescription drug management. Decision regarding hospitalization.   This patient is a 58 y.o. female who presents to the ED for concern of headache, hypertension, dizziness, this involves an extensive number of treatment options, and is a complaint that carries with it a high risk of complications and morbidity. The emergent differential diagnosis prior to evaluation includes, but is not limited to,  subarachnoid hemorrhage, cerebral ischemia, carotid/vertebral dissection, intracranial tumor, Venous sinus thrombosis, acute or chronic subdural hemorrhage.  Other considerations include: Migraine, Cluster  headache, Hypertension, Caffeine, alcohol, or drug withdrawal, Pseudotumor cerebri, Arteriovenous malformation, Tension headache, Cervical arthritis, BPPV, vestibular migraine, head trauma, AVM, intracranial tumor, multiple sclerosis, drug-related, CVA, vasovagal syncope, orthostatic hypotension, sepsis, hypoglycemia, electrolyte disturbance, anemia, anxiety/panic attack   This is not an exhaustive differential.   Past Medical History / Co-morbidities / Social History:  has a past medical history of Chronic back pain, Depression, Hypertension, Hyperthyroidism, and Rectal cancer (HCC) (10/29/2015).  Additional history: Chart reviewed.  Physical Exam: Physical exam performed. The pertinent findings include: alert and oriented and neurologically intact without focal deficits.  Lab Tests: I ordered, and personally interpreted labs.  The pertinent results include:  TSH 8.463. no other acute laboratory abnormalities   Imaging Studies: I ordered imaging studies including Ct head, CTA head and neck, MRI brain. I independently visualized and interpreted imaging which showed   CT:   1. Normal appearance of the brain itself. 2. Small air-fluid level and mild mucosal thickening in the right maxillary sinus. Correlate clinically for any signs or symptoms of acute sinusitis.  CTA:   1. No emergent large vessel occlusion. 2. Mild distal segmental narrowing within ACA and MCA branch vessels without a significant proximal stenosis or occlusion. This likely reflects intracranial atherosclerosis. 3. Normal CTA of the neck.  MRI:  1. Acute infarct in the left pons. 2. Additional increased signal in the bilateral middle cerebellar peduncles on diffusion-weighted imaging and T2 weighted sequences without definite ADC correlate. This middle cerebellar peduncle sign  is nonspecific and favored to represent subacute infarcts or wallerian degeneration after pontine infarct, although the differential is  broad and contains metabolic, neoplastic and inflammatory conditions, among others. The arc sign, with increased T2 signal extending from one middle cerebellar peduncle across the pons into the other, can be seen in CARASIL. Atypical PRES is also a consideration. 3. Mucosal thickening in the right maxillary sinus with air-fluid level, which can be seen in the setting of acute sinusitis.  I agree with the radiologist interpretation.   Cardiac Monitoring:  The patient was maintained on a cardiac monitor.  My attending physician Dr. Mannie viewed and interpreted the cardiac monitored which showed an underlying rhythm of: no STEMI. I agree with this interpretation.   Medications: I ordered  medication including compazine , benadryl , toradol , decadron   for headache cocktail. Reevaluation of the patient after these medicines showed that the patient improved. I have reviewed the patients home medicines and have made adjustments as needed.  Consultations Obtained: I requested consultation with the neurology Dr. Jerri,  and discussed lab and imaging findings as well as pertinent plan - they recommend: acute stroke on MRI, not a TNK candidate, will need admission, aspirin , plavix . They will see the patient tomorrow.   Disposition: After consideration of the diagnostic results and the patients response to treatment, I feel that patient will require admission for acute stroke.   At 1525, patient called out stating that she was having left arm numbness and weakness. My attending Dr. Mannie was informed, did reportedly have some left arm weakness on exam that was not present during my initial neurologic exam.  There was also some concern for slurred speech and therefore code stroke was activated and CT and CTA ordered for evaluation.  Teleneurology at bedside as well who recommends MRI.   Upon my assessment around 1600 when patient had returned to CT, she notes that her left arm weakness and numbness had  completely resolved and notes I think I just fell asleep on my left arm and that's what happened. Unclear about the slurred speech, patients speech does not seem any different from my previous exam and I am able to fully understand her without issue. Given MRI findings of pontine stroke which is not consistent with deficits of left-sided weakness, suspect that this is the cause of her dizziness that began on Monday morning.  Therefore well out of the window for any intervention. Regardless, patient will require admission. She is understanding and in agreement.   Discussed patient with hospitalist Dr. Pearlean who accepts patient for admission.   This is a shared visit with supervising physician Dr. Mannie who has independently evaluated patient & provided guidance in evaluation/management/disposition, in agreement with care   Final Clinical Impression(s) / ED Diagnoses Final diagnoses:  Cerebrovascular accident (CVA) due to occlusion of left pontine artery [facility])    Rx / DC Orders ED Discharge Orders     None         Nora Lauraine DELENA DEVONNA 06/09/23 2202    Mannie Pac T, DO 06/10/23 2241

## 2023-06-09 NOTE — ED Notes (Signed)
 While starting IV, pt began reporting L arm numbness. Slurred speech also noted by this RN. Neuro exam performed, see NIH documented. EDP called to room to assess patient.

## 2023-06-09 NOTE — ED Notes (Signed)
 ED TO INPATIENT HANDOFF REPORT  ED Nurse Name and Phone #: Loraina Stauffer   S Name/Age/Gender Destiny [name] 57 y.o. female Room/Bed: APA12/APA12  Code Status   Code Status: Prior  Home/SNF/Other Home Patient oriented to: self, place, time, and situation Is this baseline? Yes   Triage Complete: Triage complete  Chief Complaint Fall [W19.XXXA] Left pontine stroke [facility]) [I63.9]  Triage Note Per EMS, Pt, from home, c/o hypertension x2-3 days.  Pt reports headache and dizziness when pressure is high.  Pain score 5/10.   Pt reports her PCP directed her to the ED.  Pt took her HTN medications around 0700 and took Tylenol  prior to arrival.   High pressure at home 178/103.    Pt reports she was diagnosed w/ COVID x9 ago.  Also, of note, Pt does not tolerate BP cuff well.    Allergies Allergies  Allergen Reactions   Latex Itching and Rash    Level of Care/Admitting Diagnosis ED Disposition     ED Disposition  Admit   Condition  --   Comment  Hospital Area: [facility] [[id number]]  Level of Care: Telemetry [5]  Covid Evaluation: Asymptomatic - no recent exposure (last 10 days) testing not required  Diagnosis: Left pontine stroke [facility]) [[id number]]  Admitting Physician: EMOKPAE, EJIROGHENE E [[id number]]  Attending Physician: EMOKPAE, EJIROGHENE E [phone]  Certification:: I certify this patient will need inpatient services for at least 2 midnights  Expected Medical Readiness: 06/11/2023          B Medical/Surgery History Past Medical History:  Diagnosis Date   Chronic back pain    Depression    Hypertension    Hyperthyroidism    Rectal cancer (HCC) 10/29/2015   rectal invasice adenocarcinoma,Adenocarcinoma dx 10/29/15; Stage III, T3, N1, MO   Past Surgical History:  Procedure Laterality Date   ABDOMINAL HYSTERECTOMY     vaginal    BLADDER SURGERY     EYE SURGERY     LAZY EYE CORRECTION   ILEOSTOMY     ILEOSTOMY CLOSURE N/A 09/02/2016   Procedure: ILEOSTOMY REVERSAL;   Surgeon: Bernarda Ned, MD;  Location: [facility];  Service: General;  Laterality: N/A;   RECTAL EXAM UNDER ANESTHESIA N/A 09/02/2016   Procedure: ANAL EXAM UNDER ANESTHESIA;  Surgeon: Bernarda Ned, MD;  Location: [facility];  Service: General;  Laterality: N/A;   URETERAL REIMPLANTION Left 02/26/2016   Procedure: left ureteral neocystotomy,double J stent placement, closure vaginal cuff;  Surgeon: Morene LELON Salines, MD;  Location: [facility];  Service: Urology;  Laterality: Left;   VAGINAL HYSTERECTOMY     XI ROBOTIC ASSISTED LOWER ANTERIOR RESECTION N/A 02/26/2016   Procedure: XI ROBOTIC ASSISTED LOWER ANTERIOR RESECTION, diverting LOOP ILEOSTOMY, mobilization splenic flexure, omentopexy;  Surgeon: Bernarda Ned, MD;  Location: [facility];  Service: General;  Laterality: N/A;     A IV Location/Drains/Wounds Patient Lines/Drains/Airways Status     Active Line/Drains/Airways     Name Placement date Placement time Site Days   Peripheral IV 06/09/23 18 G Left Forearm 06/09/23  1511  Forearm  less than 1   Closed System Drain 1 Right Abdomen Bulb (JP) 19 Fr. 02/26/16  --  Abdomen  2660   Ileostomy Loop RLQ 02/26/16  1409  RLQ  2660   Ureteral Drain/Stent Left ureter 6 Fr. 02/26/16  1346  Left ureter  2660   Incision (Closed) 02/26/16 Abdomen Other (Comment) 02/26/16  1457  -- 2660   Incision (Closed) 09/02/16 Abdomen 09/02/16  1401  --  2471   Incision - 3 Ports Abdomen 1: Right;Upper 2: Right;Lower 3: Left 02/26/16  1427  -- 2660            Intake/Output Last 24 hours No intake or output data in the 24 hours ending 06/09/23 1759  Labs/Imaging Results for orders placed or performed during the hospital encounter of 06/09/23 (from the past 48 hours)  CBC     Status: None   Collection Time: 06/09/23  2:50 PM  Result Value Ref Range   WBC 9.2 4.0 - 10.5 K/uL   RBC 4.12 3.87 - 5.11 MIL/uL   Hemoglobin 12.5 12.0 - 15.0 g/dL   HCT 62.6 63.9 - 53.9 %   MCV 90.5 80.0 - 100.0 fL   MCH 30.3 26.0 - 34.0 pg    MCHC 33.5 30.0 - 36.0 g/dL   RDW 86.4 88.4 - 84.4 %   Platelets 393 150 - 400 K/uL   nRBC 0.0 0.0 - 0.2 %    Comment: Performed at [facility], [address], [place], [place] [address]  Basic metabolic panel     Status: Abnormal   Collection Time: 06/09/23  2:50 PM  Result Value Ref Range   Sodium 136 135 - 145 mmol/L   Potassium 3.8 3.5 - 5.1 mmol/L   Chloride 100 98 - 111 mmol/L   CO2 28 22 - 32 mmol/L   Glucose, Bld 91 70 - 99 mg/dL    Comment: Glucose reference range applies only to samples taken after fasting for at least 8 hours.   BUN 21 (H) 6 - 20 mg/dL   Creatinine, Ser 9.22 0.44 - 1.00 mg/dL   Calcium  8.9 8.9 - 10.3 mg/dL   GFR, Estimated >39 >39 mL/min    Comment: (NOTE) Calculated using the CKD-EPI Creatinine Equation (2021)    Anion gap 8 5 - 15    Comment: Performed at [facility], [address], [place], [place] [address]  CBG monitoring, ED     Status: None   Collection Time: 06/09/23  3:27 PM  Result Value Ref Range   Glucose-Capillary 96 70 - 99 mg/dL    Comment: Glucose reference range applies only to samples taken after fasting for at least 8 hours.   MR BRAIN WO CONTRAST Result Date: 06/09/2023 CLINICAL DATA:  Stroke suspected EXAM: MRI HEAD WITHOUT CONTRAST TECHNIQUE: Multiplanar, multiecho pulse sequences of the brain and surrounding structures were obtained without intravenous contrast. COMPARISON:  No prior MRI available, correlation is made with 06/09/2023 CT head FINDINGS: Brain: Restricted diffusion with ADC correlate in the left pons (series 5, images 14-15), which is associated with increased T2 hyperintense signal. Additional increased signal on diffusion-weighted imaging and T2 weighted sequences without definite ADC correlate in the bilateral middle cerebellar peduncles (series 5, image 12-13). No acute hemorrhage, mass, mass effect, or midline shift. No hydrocephalus or extra-axial collection. Pituitary and craniocervical junction within normal  limits. Foci of hemosiderin deposition in the pons, likely sequela of remote hypertensive microhemorrhages. Scattered T2 hyperintense signal in the periventricular white matter, likely the sequela of mild chronic small vessel ischemic disease. Vascular: Normal arterial flow voids. Skull and upper cervical spine: Normal marrow signal. Sinuses/Orbits: Mucosal thickening in the right maxillary sinus with air-fluid level. No acute finding in the orbits. Status post bilateral lens replacements. No acute finding in the orbits. Other: The mastoid air cells are well aerated. IMPRESSION: 1. Acute infarct in the left pons. 2. Additional increased signal in the bilateral middle cerebellar  peduncles on diffusion-weighted imaging and T2 weighted sequences without definite ADC correlate. This middle cerebellar peduncle sign  is nonspecific and favored to represent subacute infarcts or wallerian degeneration after pontine infarct, although the differential is broad and contains metabolic, neoplastic and inflammatory conditions, among others. The arc sign, with increased T2 signal extending from one middle cerebellar peduncle across the pons into the other, can be seen in CARASIL. Atypical PRES is also a consideration. 3. Mucosal thickening in the right maxillary sinus with air-fluid level, which can be seen in the setting of acute sinusitis. These findings were discussed by telephone on 06/09/2023 at 5:13 pm with provider [facility]. Electronically Signed   By: Donald Campion M.D.   On: 06/09/2023 17:14   CT ANGIO HEAD NECK W WO CM (CODE STROKE) Result Date: 06/09/2023 CLINICAL DATA:  Stroke, follow-up. Left arm and leg numbness. Dizziness. EXAM: CT ANGIOGRAPHY HEAD AND NECK WITH AND WITHOUT CONTRAST TECHNIQUE: Multidetector CT imaging of the head and neck was performed using the standard protocol during bolus administration of intravenous contrast. Multiplanar CT image reconstructions and MIPs were obtained to evaluate the  vascular anatomy. Carotid stenosis measurements (when applicable) are obtained utilizing NASCET criteria, using the distal internal carotid diameter as the denominator. RADIATION DOSE REDUCTION: This exam was performed according to the departmental dose-optimization program which includes automated exposure control, adjustment of the mA and/or kV according to patient size and/or use of iterative reconstruction technique. CONTRAST:  75mL OMNIPAQUE  IOHEXOL  350 MG/ML SOLN COMPARISON:  CT head without contrast 06/09/2023 FINDINGS: CTA NECK FINDINGS Aortic arch: A 3 vessel arch configuration is present. No significant atherosclerotic changes are present. No aneurysm or dissection is present. Right carotid system: Right common carotid artery is within normal limits. The bifurcation is unremarkable. The cervical right ICA is normal. Left carotid system: The left carotid artery is within normal limits. Bifurcation is unremarkable. The cervical left ICA is normal. Vertebral arteries: The vertebral arteries are codominant. Both vertebral arteries originate from the subclavian arteries without significant stenosis. No significant stenosis is present in either vertebral artery in the neck. Skeleton: The vertebral body heights and alignment are normal. No focal osseous lesions are present. The patient is edentulous. Other neck: Soft tissues the neck are otherwise unremarkable. Salivary glands are within normal limits. Thyroid  is normal. No significant adenopathy is present. No focal mucosal or submucosal lesions are present. Upper chest: The lung apices are clear. The thoracic inlet is within normal limits. Review of the MIP images confirms the above findings CTA HEAD FINDINGS Anterior circulation: Atherosclerotic calcifications are present within the cavernous left internal carotid artery. No significant stenosis is present in either internal carotid artery through the ICA termini. The right A1 segment is dominant. Hypoplastic  left A1 segment is present. The anterior communicating artery is patent. M1 segments are normal bilaterally. The MCA bifurcations are within normal limits bilaterally. Mild distal segmental narrowing is present within ACA and MCA branch vessels without a significant proximal stenosis or occlusion. No aneurysm is present. Posterior circulation: The PICA origins are visualized and normal. The vertebrobasilar junction basilar artery normal. The superior cerebellar arteries are patent. Both posterior cerebral arteries originate from basilar tip. The PCA branch vessels are normal bilaterally. Venous sinuses: The dural sinuses are patent. The straight sinus and deep cerebral veins are intact. Cortical veins are within normal limits. No significant vascular malformation is evident. Anatomic variants: None Review of the MIP images confirms the above findings IMPRESSION: 1. No emergent large vessel  occlusion. 2. Mild distal segmental narrowing within ACA and MCA branch vessels without a significant proximal stenosis or occlusion. This likely reflects intracranial atherosclerosis. 3. Normal CTA of the neck. Electronically Signed   By: Lonni Necessary M.D.   On: 06/09/2023 15:56   CT HEAD CODE STROKE WO CONTRAST Result Date: 06/09/2023 CLINICAL DATA:  Code stroke. Dizziness and headache for 2 days. Left-sided weakness began today. EXAM: CT HEAD WITHOUT CONTRAST TECHNIQUE: Contiguous axial images were obtained from the base of the skull through the vertex without intravenous contrast. RADIATION DOSE REDUCTION: This exam was performed according to the departmental dose-optimization program which includes automated exposure control, adjustment of the mA and/or kV according to patient size and/or use of iterative reconstruction technique. COMPARISON:  CT head without contrast 06/09/2023 at 2:39 p.m. FINDINGS: Brain: No acute infarct, hemorrhage, or mass lesion is present. No significant white matter lesions are present.  Deep brain nuclei are within normal limits. The ventricles are of normal size. No significant extraaxial fluid collection is present. The brainstem and cerebellum are within normal limits. Midline structures are within normal limits. Vascular: Atherosclerotic calcifications are present within the left cavernous internal carotid artery. No hyperdense vessel is present. Skull: Calvarium is intact. No focal lytic or blastic lesions are present. No significant extracranial soft tissue lesion is present. Sinuses/Orbits: Minimal fluid is again noted in the right maxillary sinus. The paranasal sinuses and mastoid air cells are otherwise clear. Bilateral lens replacements are noted. Globes and orbits are otherwise unremarkable. ASPECTS [facility] Stroke Program Early CT Score) - Ganglionic level infarction (caudate, lentiform nuclei, internal capsule, insula, M1-M3 cortex): 7/7 - Supraganglionic infarction (M4-M6 cortex): 3/3 Total score (0-10 with 10 being normal): 10/10 IMPRESSION: 1. Normal CT appearance of the brain. No acute or focal lesion to explain the patient's symptoms. 2. Aspects is 10/10. 3. Minimal right maxillary sinus disease. These results were called by telephone at the time of interpretation on 06/09/2023 at 3:49 pm to provider Dr. Mannie, who verbally acknowledged these results. Electronically Signed   By: Lonni Necessary M.D.   On: 06/09/2023 15:50   CT Head Wo Contrast Result Date: 06/09/2023 CLINICAL DATA:  New onset headache.  Hypertension.  Dizziness. EXAM: CT HEAD WITHOUT CONTRAST TECHNIQUE: Contiguous axial images were obtained from the base of the skull through the vertex without intravenous contrast. RADIATION DOSE REDUCTION: This exam was performed according to the departmental dose-optimization program which includes automated exposure control, adjustment of the mA and/or kV according to patient size and/or use of iterative reconstruction technique. COMPARISON:  10/20/2016 FINDINGS: Brain:  The brain shows a normal appearance without evidence of malformation, atrophy, old or acute small or large vessel infarction, mass lesion, hemorrhage, hydrocephalus or extra-axial collection. Vascular: No hyperdense vessel. No evidence of atherosclerotic calcification. Skull: Normal.  No traumatic finding.  No focal bone lesion. Sinuses/Orbits: Clear sinuses except for a small air-fluid level and mild mucosal thickening in the right maxillary sinus. Other: None significant IMPRESSION: 1. Normal appearance of the brain itself. 2. Small air-fluid level and mild mucosal thickening in the right maxillary sinus. Correlate clinically for any signs or symptoms of acute sinusitis. Electronically Signed   By: Oneil Officer M.D.   On: 06/09/2023 14:53    Pending Labs Unresulted Labs (From admission, onward)     Start     Ordered   06/10/23 0500  Lipid panel  (Labs)  Tomorrow morning,   R       Comments: Fasting  06/09/23 1628   06/09/23 1627  Urine drugs of abuse scrn w alc, routine (Ref Lab)  Once,   URGENT        06/09/23 1628   06/09/23 1626  Hemoglobin A1c  (Labs)  Once,   URGENT       Comments: To assess prior glycemic control    06/09/23 1628   06/09/23 1626  TSH  Once,   URGENT        06/09/23 1628   06/09/23 1626  T4, free  Once,   URGENT        06/09/23 1628            Vitals/Pain Today's Vitals   06/09/23 1235 06/09/23 1236 06/09/23 1445 06/09/23 1515  BP: (!) 166/91  (!) 167/122 (!) 169/96  Pulse: 84  81 80  Resp: 20  15 20   Temp: 98 F (36.7 C)     TempSrc: Oral     SpO2: 93%  95% 94%  Weight:  225 lb (102.1 kg)    Height:  5' 1 (1.549 m)    PainSc:  5       Isolation Precautions No active isolations  Medications Medications  LORazepam  (ATIVAN ) injection 0.5 mg (has no administration in time range)   stroke: early stages of recovery book (has no administration in time range)  aspirin  EC tablet 325 mg (has no administration in time range)    Followed by  aspirin   EC tablet 81 mg (has no administration in time range)  clopidogrel  (PLAVIX ) tablet 300 mg (has no administration in time range)    Followed by  clopidogrel  (PLAVIX ) tablet 75 mg (has no administration in time range)  prochlorperazine  (COMPAZINE ) injection 10 mg (10 mg Intravenous Given 06/09/23 1512)  diphenhydrAMINE  (BENADRYL ) injection 25 mg (25 mg Intravenous Given 06/09/23 1512)  dexamethasone  (DECADRON ) injection 10 mg (10 mg Intravenous Given 06/09/23 1512)  ketorolac  (TORADOL ) 15 MG/ML injection 15 mg (15 mg Intravenous Given 06/09/23 1512)  iohexol  (OMNIPAQUE ) 350 MG/ML injection 75 mL (75 mLs Intravenous Contrast Given 06/09/23 1533)    Mobility walks     Focused Assessments Neuro Assessment Handoff:  Swallow screen pass? Yes    NIH Stroke Scale  Dizziness Present: Yes Headache Present: Yes Interval: Neuro change Level of Consciousness (1a.)   : Alert, keenly responsive LOC Questions (1b. )   : Answers both questions correctly LOC Commands (1c. )   : Performs both tasks correctly Best Gaze (2. )  : Normal Visual (3. )  : No visual loss Facial Palsy (4. )    : Normal symmetrical movements Motor Arm, Left (5a. )   : No drift Motor Arm, Right (5b. ) : No drift Motor Leg, Left (6a. )  : No drift Motor Leg, Right (6b. ) : No drift Limb Ataxia (7. ): Absent Sensory (8. )  : Normal, no sensory loss Best Language (9. )  : No aphasia Dysarthria (10. ): Normal Extinction/Inattention (11.)   : No Abnormality Complete NIHSS TOTAL: 0     Neuro Assessment: Exceptions to WDL Neuro Checks:   Initial (06/09/23 1510)  Has TPA been given? No If patient is a Neuro Trauma and patient is going to OR before floor call report to 4N Charge nurse: [phone] or [phone]   R Recommendations: See Admitting Provider Note  Report given to:   Additional Notes:

## 2023-06-09 NOTE — H&P (Signed)
 History and Physical    Destiny [name] FMW:[id number] DOB: [birth year] DOA: 06/09/2023  PCP: Dettinger, Fonda LABOR, MD   Patient coming from: Home  I have personally briefly reviewed patient's old medical records in [facility]  Chief Complaint: Dizziness  HPI: Destiny [name] is a 58 y.o. female with medical history significant for stroke, hypertension, obesity, colon cancer. Patient presented to the ED with complaints of headache, dizziness, and hypotension that started about 3 days ago.  She went to her outpatient providers office and her blood pressure was found to be high, she was told to monitor this and get back to them.  She denies slurred speech, she has been able to ambulate without difficulty, no weakness of extremities, no facial asymmetry.  At time of my evaluation, she tells me her speech is at baseline-and she had not noted any changes for arrival in the ED and in the ED.  She was diagnosed with COVID 9 days ago, symptoms mostly cough, nasal congestion ?? Paxlovid, steroids and doxycycline.  She has completed treatment and is back to baseline.  ED Course: On arrival to the ED, patient's nurse reported slurred speech, also reported left arm grip weakness and left leg drift.  Blood pressure systolic 166-169. Code stroke called in the ED. Head CT-without acute abnormality. MRI brain without contrast-acute infarct in the left pons, also middle cerebellar peduncle sign favoring subacute infarcts wallerian degeneration after pontine infarct.  Atypical press also listed in body of report. Evaluated by teleneurologist Dr. Elodie admission for stroke workup, feels findings are more likely subacute stroke probably 2 days ago. Aspirin , Plavix .  Review of Systems: As per HPI all other systems reviewed and negative.  Past Medical History:  Diagnosis Date   Chronic back pain    Depression    Hypertension    Hyperthyroidism    Rectal cancer (HCC) 10/29/2015   rectal invasice  adenocarcinoma,Adenocarcinoma dx 10/29/15; Stage III, T3, N1, MO    Past Surgical History:  Procedure Laterality Date   ABDOMINAL HYSTERECTOMY     vaginal    BLADDER SURGERY     EYE SURGERY     LAZY EYE CORRECTION   ILEOSTOMY     ILEOSTOMY CLOSURE N/A 09/02/2016   Procedure: ILEOSTOMY REVERSAL;  Surgeon: Bernarda Ned, MD;  Location: [facility];  Service: General;  Laterality: N/A;   RECTAL EXAM UNDER ANESTHESIA N/A 09/02/2016   Procedure: ANAL EXAM UNDER ANESTHESIA;  Surgeon: Bernarda Ned, MD;  Location: [facility];  Service: General;  Laterality: N/A;   URETERAL REIMPLANTION Left 02/26/2016   Procedure: left ureteral neocystotomy,double J stent placement, closure vaginal cuff;  Surgeon: Morene LELON Salines, MD;  Location: [facility];  Service: Urology;  Laterality: Left;   VAGINAL HYSTERECTOMY     XI ROBOTIC ASSISTED LOWER ANTERIOR RESECTION N/A 02/26/2016   Procedure: XI ROBOTIC ASSISTED LOWER ANTERIOR RESECTION, diverting LOOP ILEOSTOMY, mobilization splenic flexure, omentopexy;  Surgeon: Bernarda Ned, MD;  Location: [facility];  Service: General;  Laterality: N/A;     reports that she has never smoked. She has never used smokeless tobacco. She reports that she does not drink alcohol and does not use drugs.  Allergies  Allergen Reactions   Latex Itching and Rash    Family History  Problem Relation Age of Onset   Thyroid  disease Mother    Arthritis Mother    Lung cancer Father        lung   Migraines Sister    Depression Sister  Migraines Daughter    Cirrhosis Brother    Breast cancer Neg Hx     Prior to Admission medications   Medication Sig Start Date End Date Taking? Authorizing Provider  acetaminophen  (TYLENOL ) 500 MG tablet Take 1,000 mg by mouth every 8 (eight) hours as needed for moderate pain.   Yes [provider]  albuterol  (VENTOLIN  HFA) 108 (90 Base) MCG/ACT inhaler Inhale 1-2 puffs into the lungs every 6 (six) hours as needed for wheezing or shortness of breath.  03/16/22  Yes Triplett, Tammy, PA-C  azelastine (OPTIVAR) 0.05 % ophthalmic solution Apply 1 drop to eye 2 (two) times daily. 01/30/21  Yes [provider]  Cholecalciferol (VITAMIN D3 PO) Take 1 tablet by mouth daily.   Yes [provider]  colestipol (COLESTID) 1 g tablet Take 1 g by mouth daily. 04/21/21  Yes [provider]  cyclobenzaprine  (FLEXERIL ) 10 MG tablet TAKE 1 TABLET BY MOUTH AT BEDTIME AS NEEDED FOR MUSCLE SPASMS. 04/22/23  Yes Dettinger, Fonda LABOR, MD  diclofenac  (VOLTAREN ) 75 MG EC tablet Take 1 tab twice daily for muscle and joint pain 04/23/20  Yes Dettinger, Fonda LABOR, MD  dicyclomine  (BENTYL ) 20 MG tablet Take 20 mg by mouth 3 (three) times daily before meals. 05/29/21 06/09/23 Yes [provider]  diphenoxylate -atropine  (LOMOTIL ) 2.5-0.025 MG tablet Take 2 tablets by mouth daily. 05/27/21  Yes [provider]  fluconazole  (DIFLUCAN ) 200 MG tablet Take 200 mg by mouth daily. 05/27/21  Yes [provider]  fluticasone  (FLONASE ) 50 MCG/ACT nasal spray PLACE 1 SPRAY INTO BOTH NOSTRILS 2 (TWO) TIMES DAILY 01/18/23  Yes Dettinger, Fonda LABOR, MD  hydrochlorothiazide  (HYDRODIURIL ) 25 MG tablet Take 1 tablet (25 mg total) by mouth daily. 12/02/22  Yes Dettinger, Fonda LABOR, MD  levothyroxine  (SYNTHROID ) 200 MCG tablet Take 1 tablet (200 mcg total) by mouth daily. 12/04/22  Yes Dettinger, Fonda LABOR, MD  LORazepam  (ATIVAN ) 0.5 MG tablet TAKE 1 TABLET (0.5 MG) BY MOUTH AT BEDTIME AS NEEDED FOR ANXIETY 12/02/22  Yes Dettinger, Fonda LABOR, MD  mirabegron  ER (MYRBETRIQ ) 25 MG TB24 tablet Take 1 tablet (25 mg total) by mouth daily. 06/07/23  Yes Dettinger, Fonda LABOR, MD  ondansetron  (ZOFRAN ) 8 MG tablet Take 1 tablet by mouth every 8 hours as needed for nausea or vomiting. 06/09/17  Yes Cloretta Arley NOVAK, MD  Turmeric (QC TUMERIC COMPLEX PO) Take 1 capsule by mouth daily.   Yes [provider]  vitamin B-12 (CYANOCOBALAMIN ) 100 MCG tablet Take 100 mcg by  mouth daily.   Yes [provider]  vitamin C  (ASCORBIC ACID ) 500 MG tablet Take 500 mg by mouth daily.   Yes [provider]  zinc  gluconate 50 MG tablet Take 50 mg by mouth daily.   Yes [provider]    Physical Exam: Vitals:   06/09/23 1235 06/09/23 1236 06/09/23 1445 06/09/23 1515  BP: (!) 166/91  (!) 167/122 (!) 169/96  Pulse: 84  81 80  Resp: 20  15 20   Temp: 98 F (36.7 C)     TempSrc: Oral     SpO2: 93%  95% 94%  Weight:  102.1 kg    Height:  5' 1 (1.549 m)      Constitutional: NAD, calm, comfortable Vitals:   06/09/23 1235 06/09/23 1236 06/09/23 1445 06/09/23 1515  BP: (!) 166/91  (!) 167/122 (!) 169/96  Pulse: 84  81 80  Resp: 20  15 20   Temp: 98 F (36.7 C)  TempSrc: Oral     SpO2: 93%  95% 94%  Weight:  102.1 kg    Height:  5' 1 (1.549 m)     Eyes: PERRL, lids and conjunctivae normal ENMT: Mucous membranes are moist.   Neck: normal, supple, no masses, no thyromegaly Respiratory: clear to auscultation bilaterally, no wheezing, no crackles. Normal respiratory effort. No accessory muscle use.  Cardiovascular: Regular rate and rhythm, no murmurs / rubs / gallops. No extremity edema. Extremities warm Abdomen: no tenderness, no masses palpated. No hepatosplenomegaly. Bowel sounds positive.  Musculoskeletal: no clubbing / cyanosis. No joint deformity upper and lower extremities.  Skin: no rashes, lesions, ulcers. No induration Neurologic: 5/5 strength bilateral upper and lower extremities, no facial asymmetry, speech fluent without evidence of aphasia. Psychiatric: Normal judgment and insight. Alert and oriented x 3. Normal mood.   Labs on Admission: I have personally reviewed following labs and imaging studies  CBC: Recent Labs  Lab 06/07/23 1503 06/09/23 1450  WBC 9.8 9.2  NEUTROABS 6.7  --   HGB 12.4 12.5  HCT 37.9 37.3  MCV 90 90.5  PLT 403 393   Basic Metabolic Panel: Recent Labs  Lab 06/07/23 1503 06/09/23 1450   NA 140 136  K 4.1 3.8  CL 98 100  CO2 27 28  GLUCOSE 106* 91  BUN 21 21*  CREATININE 0.77 0.77  CALCIUM  9.6 8.9   GFR: Estimated Creatinine Clearance: 85.1 mL/min (by C-G formula based on SCr of 0.77 mg/dL). Liver Function Tests: Recent Labs  Lab 06/07/23 1503  AST 11  ALT 12  ALKPHOS 79  BILITOT 0.2  PROT 6.8  ALBUMIN 4.0   CBG: Recent Labs  Lab 06/09/23 1527  GLUCAP 96   Lipid Profile: Recent Labs    06/07/23 1503  CHOL 173  HDL 64  LDLCALC 70  TRIG 246*  CHOLHDL 2.7   Thyroid  Function Tests: Recent Labs    06/07/23 1503  TSH 7.580*   Anemia Panel: No results for input(s): VITAMINB12, FOLATE, FERRITIN, TIBC, IRON, RETICCTPCT in the last 72 hours. Urine analysis:    Component Value Date/Time   COLORURINE YELLOW 02/19/2022 1336   APPEARANCEUR Cloudy (A) 02/18/2023 1228   LABSPEC 1.023 02/19/2022 1336   LABSPEC 1.005 07/09/2016 1038   PHURINE 5.5 02/19/2022 1336   GLUCOSEU Negative 02/18/2023 1228   GLUCOSEU Negative 07/09/2016 1038   HGBUR NEGATIVE 02/19/2022 1336   BILIRUBINUR Negative 02/18/2023 1228   BILIRUBINUR Negative 07/09/2016 1038   KETONESUR NEGATIVE 02/19/2022 1336   PROTEINUR 2+ (A) 02/18/2023 1228   PROTEINUR NEGATIVE 02/19/2022 1336   UROBILINOGEN 0.2 07/09/2016 1038   NITRITE Positive (A) 02/18/2023 1228   NITRITE POSITIVE (A) 02/19/2022 1336   LEUKOCYTESUR 3+ (A) 02/18/2023 1228   LEUKOCYTESUR LARGE (A) 02/19/2022 1336   LEUKOCYTESUR Moderate 07/09/2016 1038    Radiological Exams on Admission: MR BRAIN WO CONTRAST Result Date: 06/09/2023 CLINICAL DATA:  Stroke suspected EXAM: MRI HEAD WITHOUT CONTRAST TECHNIQUE: Multiplanar, multiecho pulse sequences of the brain and surrounding structures were obtained without intravenous contrast. COMPARISON:  No prior MRI available, correlation is made with 06/09/2023 CT head FINDINGS: Brain: Restricted diffusion with ADC correlate in the left pons (series 5, images 14-15), which  is associated with increased T2 hyperintense signal. Additional increased signal on diffusion-weighted imaging and T2 weighted sequences without definite ADC correlate in the bilateral middle cerebellar peduncles (series 5, image 12-13). No acute hemorrhage, mass, mass effect, or midline shift. No hydrocephalus or extra-axial collection. Pituitary and  craniocervical junction within normal limits. Foci of hemosiderin deposition in the pons, likely sequela of remote hypertensive microhemorrhages. Scattered T2 hyperintense signal in the periventricular white matter, likely the sequela of mild chronic small vessel ischemic disease. Vascular: Normal arterial flow voids. Skull and upper cervical spine: Normal marrow signal. Sinuses/Orbits: Mucosal thickening in the right maxillary sinus with air-fluid level. No acute finding in the orbits. Status post bilateral lens replacements. No acute finding in the orbits. Other: The mastoid air cells are well aerated. IMPRESSION: 1. Acute infarct in the left pons. 2. Additional increased signal in the bilateral middle cerebellar peduncles on diffusion-weighted imaging and T2 weighted sequences without definite ADC correlate. This middle cerebellar peduncle sign  is nonspecific and favored to represent subacute infarcts or wallerian degeneration after pontine infarct, although the differential is broad and contains metabolic, neoplastic and inflammatory conditions, among others. The arc sign, with increased T2 signal extending from one middle cerebellar peduncle across the pons into the other, can be seen in CARASIL. Atypical PRES is also a consideration. 3. Mucosal thickening in the right maxillary sinus with air-fluid level, which can be seen in the setting of acute sinusitis. These findings were discussed by telephone on 06/09/2023 at 5:13 pm with provider [facility]. Electronically Signed   By: Donald Campion M.D.   On: 06/09/2023 17:14   CT ANGIO HEAD NECK W WO CM (CODE  STROKE) Result Date: 06/09/2023 CLINICAL DATA:  Stroke, follow-up. Left arm and leg numbness. Dizziness. EXAM: CT ANGIOGRAPHY HEAD AND NECK WITH AND WITHOUT CONTRAST TECHNIQUE: Multidetector CT imaging of the head and neck was performed using the standard protocol during bolus administration of intravenous contrast. Multiplanar CT image reconstructions and MIPs were obtained to evaluate the vascular anatomy. Carotid stenosis measurements (when applicable) are obtained utilizing NASCET criteria, using the distal internal carotid diameter as the denominator. RADIATION DOSE REDUCTION: This exam was performed according to the departmental dose-optimization program which includes automated exposure control, adjustment of the mA and/or kV according to patient size and/or use of iterative reconstruction technique. CONTRAST:  75mL OMNIPAQUE  IOHEXOL  350 MG/ML SOLN COMPARISON:  CT head without contrast 06/09/2023 FINDINGS: CTA NECK FINDINGS Aortic arch: A 3 vessel arch configuration is present. No significant atherosclerotic changes are present. No aneurysm or dissection is present. Right carotid system: Right common carotid artery is within normal limits. The bifurcation is unremarkable. The cervical right ICA is normal. Left carotid system: The left carotid artery is within normal limits. Bifurcation is unremarkable. The cervical left ICA is normal. Vertebral arteries: The vertebral arteries are codominant. Both vertebral arteries originate from the subclavian arteries without significant stenosis. No significant stenosis is present in either vertebral artery in the neck. Skeleton: The vertebral body heights and alignment are normal. No focal osseous lesions are present. The patient is edentulous. Other neck: Soft tissues the neck are otherwise unremarkable. Salivary glands are within normal limits. Thyroid  is normal. No significant adenopathy is present. No focal mucosal or submucosal lesions are present. Upper chest: The  lung apices are clear. The thoracic inlet is within normal limits. Review of the MIP images confirms the above findings CTA HEAD FINDINGS Anterior circulation: Atherosclerotic calcifications are present within the cavernous left internal carotid artery. No significant stenosis is present in either internal carotid artery through the ICA termini. The right A1 segment is dominant. Hypoplastic left A1 segment is present. The anterior communicating artery is patent. M1 segments are normal bilaterally. The MCA bifurcations are within normal limits bilaterally. Mild distal  segmental narrowing is present within ACA and MCA branch vessels without a significant proximal stenosis or occlusion. No aneurysm is present. Posterior circulation: The PICA origins are visualized and normal. The vertebrobasilar junction basilar artery normal. The superior cerebellar arteries are patent. Both posterior cerebral arteries originate from basilar tip. The PCA branch vessels are normal bilaterally. Venous sinuses: The dural sinuses are patent. The straight sinus and deep cerebral veins are intact. Cortical veins are within normal limits. No significant vascular malformation is evident. Anatomic variants: None Review of the MIP images confirms the above findings IMPRESSION: 1. No emergent large vessel occlusion. 2. Mild distal segmental narrowing within ACA and MCA branch vessels without a significant proximal stenosis or occlusion. This likely reflects intracranial atherosclerosis. 3. Normal CTA of the neck. Electronically Signed   By: Lonni Necessary M.D.   On: 06/09/2023 15:56   CT HEAD CODE STROKE WO CONTRAST Result Date: 06/09/2023 CLINICAL DATA:  Code stroke. Dizziness and headache for 2 days. Left-sided weakness began today. EXAM: CT HEAD WITHOUT CONTRAST TECHNIQUE: Contiguous axial images were obtained from the base of the skull through the vertex without intravenous contrast. RADIATION DOSE REDUCTION: This exam was performed  according to the departmental dose-optimization program which includes automated exposure control, adjustment of the mA and/or kV according to patient size and/or use of iterative reconstruction technique. COMPARISON:  CT head without contrast 06/09/2023 at 2:39 p.m. FINDINGS: Brain: No acute infarct, hemorrhage, or mass lesion is present. No significant white matter lesions are present. Deep brain nuclei are within normal limits. The ventricles are of normal size. No significant extraaxial fluid collection is present. The brainstem and cerebellum are within normal limits. Midline structures are within normal limits. Vascular: Atherosclerotic calcifications are present within the left cavernous internal carotid artery. No hyperdense vessel is present. Skull: Calvarium is intact. No focal lytic or blastic lesions are present. No significant extracranial soft tissue lesion is present. Sinuses/Orbits: Minimal fluid is again noted in the right maxillary sinus. The paranasal sinuses and mastoid air cells are otherwise clear. Bilateral lens replacements are noted. Globes and orbits are otherwise unremarkable. ASPECTS [facility] Stroke Program Early CT Score) - Ganglionic level infarction (caudate, lentiform nuclei, internal capsule, insula, M1-M3 cortex): 7/7 - Supraganglionic infarction (M4-M6 cortex): 3/3 Total score (0-10 with 10 being normal): 10/10 IMPRESSION: 1. Normal CT appearance of the brain. No acute or focal lesion to explain the patient's symptoms. 2. Aspects is 10/10. 3. Minimal right maxillary sinus disease. These results were called by telephone at the time of interpretation on 06/09/2023 at 3:49 pm to provider Dr. Mannie, who verbally acknowledged these results. Electronically Signed   By: Lonni Necessary M.D.   On: 06/09/2023 15:50   CT Head Wo Contrast Result Date: 06/09/2023 CLINICAL DATA:  New onset headache.  Hypertension.  Dizziness. EXAM: CT HEAD WITHOUT CONTRAST TECHNIQUE: Contiguous axial  images were obtained from the base of the skull through the vertex without intravenous contrast. RADIATION DOSE REDUCTION: This exam was performed according to the departmental dose-optimization program which includes automated exposure control, adjustment of the mA and/or kV according to patient size and/or use of iterative reconstruction technique. COMPARISON:  10/20/2016 FINDINGS: Brain: The brain shows a normal appearance without evidence of malformation, atrophy, old or acute small or large vessel infarction, mass lesion, hemorrhage, hydrocephalus or extra-axial collection. Vascular: No hyperdense vessel. No evidence of atherosclerotic calcification. Skull: Normal.  No traumatic finding.  No focal bone lesion. Sinuses/Orbits: Clear sinuses except for a small air-fluid level  and mild mucosal thickening in the right maxillary sinus. Other: None significant IMPRESSION: 1. Normal appearance of the brain itself. 2. Small air-fluid level and mild mucosal thickening in the right maxillary sinus. Correlate clinically for any signs or symptoms of acute sinusitis. Electronically Signed   By: Oneil Officer M.D.   On: 06/09/2023 14:53    EKG: Independently reviewed.  Sinus rhythm, rate 78, QTc 462.  No significant change from prior.  Assessment/Plan Principal Problem:   Left pontine stroke [facility]) Active Problems:   Depression   Essential hypertension, benign   Morbid obesity (HCC)   Hypothyroidism   Assessment and Plan: * Left pontine stroke (HCC) Presenting with headache, dizziness.  Reported slurred speech, left arm grip weakness, left leg drip in ED. on my evaluation, she has 5 of 5 strength to bilateral upper and lower extremities, speech is fluent without evidence of aphasia, patient tells me her speech is at baseline and denies abnormal speech.  Code stroke called.  Head CT without acute abnormality.  - MRI brain without contrast- acute infarct in the left pons, also middle cerebellar peduncle sign  favoring subacute infarcts, wallerian degeneration after pontine infarct.  Atypical press also listed in body of report. - Evaluated by teleneurologist Dr. Elodie admission for stroke workup, feels findings are more likely subacute stroke probably 2 days ago. -Per neurologist start aspirin , Plavix , stroke workup -Echocardiogram -Lipid panel, A1c -CTA neck obtained carotid Dopplers deferred -Passed swallow eval -PT, speech therapy eval -Code Stroke called, evaluated by teleneurologist -Allow for permissive hypertension tonight, resume home antihypertensives in a.m.  Hypothyroidism Resume Synthroid   Essential hypertension, benign Elevated blood pressure systolic up to 160s. - Hydrochlorothiazide  and resume in a.m. - PRN labetalol  10mg  for systolic greater than 200   DVT prophylaxis: Lovenox  Code Status: Full code Family Communication: None at bedside Disposition Plan: ~ 1-2 days Consults called: Neurology Admission status: inpt tele I certify that at the point of admission it is my clinical judgment that the patient will require inpatient hospital care spanning beyond 2 midnights from the point of admission due to high intensity of service, high risk for further deterioration and high frequency of surveillance required.   Author: Tully FORBES Carwin, MD 06/09/2023 6:47 PM  For on call review [email or web address].

## 2023-06-09 NOTE — Progress Notes (Signed)
 Addendum:  MRI preliminary review showed left mid pontine infarct, no hemorrhage. T2 FLAIR also showed T2 changes, indicating the stroke probably more subacute. If go by her gait imbalance from Monday, probably her stroke has been for 2 days. But anyway, she needs to be admitted for stroke work up including echo, lipid panel, A1C, TSH. Needs PT and OT and speech. Stroke risk factor modification.   If she passed swallow bedside screening, will recommend ASA 325 and plavix  300 loading, followed by ASA 81 and plavix  75 daily from tomorrow. If not passing swallow yet, will need ASA PR 300. Discussed with ED PA and RN.   Ary Cummins, MD PhD Stroke Neurology 06/09/2023 5:04 PM

## 2023-06-09 NOTE — ED Notes (Signed)
 Pt transported to MRI via stretcher.

## 2023-06-09 NOTE — ED Triage Notes (Addendum)
 Per EMS, Pt, from home, c/o hypertension x2-3 days.  Pt reports headache and dizziness when pressure is high.  Pain score 5/10.   Pt reports her PCP directed her to the ED.  Pt took her HTN medications around 0700 and took Tylenol  prior to arrival.   High pressure at home 178/103.    Pt reports she was diagnosed w/ COVID x9 ago.  Also, of note, Pt does not tolerate BP cuff well.

## 2023-06-09 NOTE — Telephone Encounter (Signed)
 Copied from CRM [id number]. Topic: Clinical - Red Word Triage >> Jun 09, 2023 10:10 AM Delon DASEN wrote: [facility] that prompted transfer to Nurse Triage: high blood pressure 195/103, dizziness/lightheaded, feet feel heavy   Chief Complaint: Hypertension  Symptoms: Elevated blood pressure, heaviness in feet, dizziness/lightheadedness Frequency: Constant  Disposition: [x] ED /[] Urgent Care (no appt availability in office) / [] Appointment(In office/virtual)/ []  [facility]/ [] Home Care/ [] Refused Recommended Disposition /[] [facility]/ []  Follow-up with PCP Additional Notes: Patient reports that her blood pressure has been elevated in the last few days, stating today it was 195/103. She states that today she has also been experiencing dizziness and heaviness in her feet. She states that I feel like I'm drunk when I'm moving around. Patient states that she was seen 2 days ago by her PCP for her blood pressure. Patient advised that due to her blood pressure along with the dizziness and heaviness in her feet that she should seek evaluation and treatment at the ED. Patient verbalized understanding of this.      Reason for Disposition  [1] Systolic BP  >= 160 OR Diastolic >= 100 AND [2] cardiac (e.g., breathing difficulty, chest pain) or neurologic symptoms (e.g., new-onset blurred or double vision, unsteady gait)  Answer Assessment - Initial Assessment Questions 1. BLOOD PRESSURE: What is the blood pressure? Did you take at least two measurements 5 minutes apart?     195/103 2. ONSET: When did you take your blood pressure?     Today, but going on for a few days  3. HOW: How did you take your blood pressure? (e.g., automatic home BP monitor, visiting nurse)     Automatic machine at pharmacy 4. HISTORY: Do you have a history of high blood pressure?     Yes 5. MEDICINES: Are you taking any medicines for blood pressure? Have you missed any doses recently?     Yes   6. OTHER SYMPTOMS: Do you have any symptoms? (e.g., blurred vision, chest pain, difficulty breathing, headache, weakness)     Dizziness/lightheadedness, feels like my feet weigh a hundred pounds 7. PREGNANCY: Is there any chance you are pregnant? When was your last menstrual period?     No  Protocols used: Blood Pressure - High-A-AH

## 2023-06-09 NOTE — ED Notes (Signed)
 CODE STROKE paged out at [facility]

## 2023-06-10 ENCOUNTER — Inpatient Hospital Stay (HOSPITAL_COMMUNITY): Payer: 59

## 2023-06-10 DIAGNOSIS — I10 Essential (primary) hypertension: Secondary | ICD-10-CM | POA: Diagnosis not present

## 2023-06-10 DIAGNOSIS — I639 Cerebral infarction, unspecified: Secondary | ICD-10-CM | POA: Diagnosis not present

## 2023-06-10 DIAGNOSIS — I6389 Other cerebral infarction: Secondary | ICD-10-CM | POA: Diagnosis not present

## 2023-06-10 DIAGNOSIS — I6381 Other cerebral infarction due to occlusion or stenosis of small artery: Secondary | ICD-10-CM | POA: Diagnosis not present

## 2023-06-10 LAB — LIPID PANEL
Cholesterol: 225 mg/dL — ABNORMAL HIGH (ref 0–200)
HDL: 71 mg/dL (ref >40)
LDL Cholesterol: 115 mg/dL — ABNORMAL HIGH (ref 0–99)
Total CHOL/HDL Ratio: 3.2 {ratio}
Triglycerides: 197 mg/dL — ABNORMAL HIGH (ref <150)
VLDL: 39 mg/dL (ref 0–40)

## 2023-06-10 LAB — ECHOCARDIOGRAM COMPLETE
AR max vel: 2.35 cm2
AV Area VTI: 2.4 cm2
AV Area mean vel: 2.4 cm2
AV Mean grad: 4 mm[Hg]
AV Peak grad: 8 mm[Hg]
Ao pk vel: 1.41 m/s
Area-P 1/2: 5.13 cm2
Height: 61 in
S' Lateral: 3.3 cm
Weight: 3600 [oz_av]

## 2023-06-10 LAB — OCCULT BLOOD X 1 CARD TO LAB, STOOL: Fecal Occult Bld: NEGATIVE

## 2023-06-10 LAB — C DIFFICILE QUICK SCREEN W PCR REFLEX
C Diff antigen: NEGATIVE
C Diff interpretation: NOT DETECTED
C Diff toxin: NEGATIVE

## 2023-06-10 LAB — HIV ANTIBODY (ROUTINE TESTING W REFLEX): HIV Screen 4th Generation wRfx: NONREACTIVE

## 2023-06-10 MED ORDER — ATORVASTATIN CALCIUM 40 MG PO TABS: 40.0000 mg | ORAL_TABLET | Freq: Every day | ORAL | 1 refills | Status: DC

## 2023-06-10 MED ORDER — ASPIRIN 81 MG PO TBEC: 81.0000 mg | DELAYED_RELEASE_TABLET | Freq: Every day | ORAL | Status: AC

## 2023-06-10 MED ORDER — CLOPIDOGREL BISULFATE 75 MG PO TABS: 75.0000 mg | ORAL_TABLET | Freq: Every day | ORAL | 0 refills | Status: DC

## 2023-06-10 MED ORDER — ATORVASTATIN CALCIUM 40 MG PO TABS: 40.0000 mg | ORAL_TABLET | Freq: Every day | ORAL | Status: DC

## 2023-06-10 NOTE — Hospital Course (Signed)
 58 y/o female with history of hypertension, rectal cancer, stroke, and hypothyroidism presenting with dizziness and vertigo that began on 06/07/2023.  The patient went to see her PCP.  She was noted to have elevated blood pressure in the office.  She was told to follow her blood pressure for the next several days and if remains elevated to follow back up.  On 06/09/2023, the patient had some numbness in her left hand and left foot with some decreased grip strength.  She checked her blood pressure at home and it was 195/103.  As result, she presented for further evaluation and treatment.  She denied any visual disturbance, slurred speech, chest pain, shortness breath, abdominal pain.  There is no dysuria or hematuria.  She denies any fevers, chills, headache, neck pain.  Notably, the patient has chronic loose stools.  She states that she has about 5 stools on a daily basis.  Since her ileostomy surgery and reversal.  She noted a number of episodes of large-volume hematochezia on 06/04/2023 and 06/05/2023.  She continues to have loose stool since then without any further blood.  Notably, the patient went to urgent care on 05/31/2023 and was diagnosed with COVID-19.  She was placed on doxycycline, prednisone , and molnupiravir .  She states that her symptoms have since improved.  In the ED, the patient was afebrile hemodynamically stable with oxygen  saturation 97% room air.  WBC 9.2, hemoglobin 12.5, platelets 393.  Sodium 136, potassium 3.8, bicarbonate 28, serum creatinine 0.77.  TSH 8.463.  CT brain negative for acute findings.  MRI brain shows an acute infarct in the left pons.  CTA head and neck was negative for LVO.  Showed mild distal narrowing in the ACA and MCA branch vessels.  There is no hemodynamic significant stenosis in the neck.  The patient was started on aspirin  Plavix .

## 2023-06-10 NOTE — Telephone Encounter (Signed)
 Yes well please get an appointment as soon as possible after she leaves the hospital because I will want to see her.  We will want to get the blood pressure down.

## 2023-06-10 NOTE — Telephone Encounter (Signed)
 HFU scheduled 1/10 at 2:05pm. Pt made aware.

## 2023-06-10 NOTE — Discharge Summary (Signed)
 Physician Discharge Summary   Patient: Destiny [name] MRN: [id number] DOB: [birth year]  Admit date:     06/09/2023  Discharge date: 06/10/23  Discharge Physician: Alm Dwight Adamczak   PCP: Dettinger, Fonda LABOR, MD   Recommendations at discharge:   Please follow up with primary care provider within 1-2 weeks  Please repeat BMP and CBC in one week     Hospital Course: 58 y/o female with history of hypertension, rectal cancer, stroke, and hypothyroidism presenting with dizziness and vertigo that began on 06/07/2023.  The patient went to see her PCP.  She was noted to have elevated blood pressure in the office.  She was told to follow her blood pressure for the next several days and if remains elevated to follow back up.  On 06/09/2023, the patient had some numbness in her left hand and left foot with some decreased grip strength.  She checked her blood pressure at home and it was 195/103.  As result, she presented for further evaluation and treatment.  She denied any visual disturbance, slurred speech, chest pain, shortness breath, abdominal pain.  There is no dysuria or hematuria.  She denies any fevers, chills, headache, neck pain.  Notably, the patient has chronic loose stools.  She states that she has about 5 stools on a daily basis.  Since her ileostomy surgery and reversal.  She noted a number of episodes of large-volume hematochezia on 06/04/2023 and 06/05/2023.  She continues to have loose stool since then without any further blood.  Notably, the patient went to urgent care on 05/31/2023 and was diagnosed with COVID-19.  She was placed on doxycycline, prednisone , and molnupiravir .  She states that her symptoms have since improved.  In the ED, the patient was afebrile hemodynamically stable with oxygen  saturation 97% room air.  WBC 9.2, hemoglobin 12.5, platelets 393.  Sodium 136, potassium 3.8, bicarbonate 28, serum creatinine 0.77.  TSH 8.463.  CT brain negative for acute findings.  MRI brain shows an acute  infarct in the left pons.  CTA head and neck was negative for LVO.  Showed mild distal narrowing in the ACA and MCA branch vessels.  There is no hemodynamic significant stenosis in the neck.  The patient was started on aspirin  Plavix .  Assessment and Plan:  Acute ischemic stroke -Likely small vessel disease -Appreciate Neurology Consult -PT/OT evaluation -Speech therapy eval -CT brain--neg -MRI brain--acute infarct left pons; increased signal bilateral middle cerebellar peduncles -CTA H&N--no hemodynamically significant stenosis in the carotids.  Negative LVO.  Mild distal narrowing ACA and MCA branch vessels. -Echo--EF 60-65%, no WMA, G1DD, normal RVF, no PFO -LDL--115>>start lipitor 40 mg daily -HbA1C--6.3 -Antiplatelet--ASA 81 and plavix  75 mg daily x 3 weeks, then ASA 81 mg daily -30 day cardiac monitor has been requested through [facility]  Impaired glucose tolerance -A1C = 6.3 -follow up with PCP   Essential hypertension -Holding HCTZ temporarily to allow for permissive hypertension -restart after dc   Hematochezia -Patient had hematochezia 07/01/2023 and 06/05/2023 -Hemoglobin stable -Monitor with the patient starting aspirin  and Plavix    Diarrhea -Chronic since the patient's ileocolic anastomosis -Stool pathogen panel -Check C. Difficile   Hypothyroidism -Continue Synthroid  -TSH 8.463 -Repeat TSH in 3 to 4 weeks   Morbid obesity -BMI 42.51 -Left modification   Stage III rectal cancer -Currently in remission -Patient follows Dr. Cloretta -Last colonoscopy 08/26/2022 with adenomatous polyps removed           Consultants: neurology Procedures performed: none  Disposition: Home  Diet recommendation:  Carb modified diet DISCHARGE MEDICATION: Allergies as of 06/10/2023       Reactions   Latex Itching, Rash        Medication List     STOP taking these medications    diclofenac  75 MG EC tablet Commonly known as: VOLTAREN    fluconazole  200 MG  tablet Commonly known as: DIFLUCAN        TAKE these medications    acetaminophen  500 MG tablet Commonly known as: TYLENOL  Take 1,000 mg by mouth every 8 (eight) hours as needed for moderate pain.   albuterol  108 (90 Base) MCG/ACT inhaler Commonly known as: VENTOLIN  HFA Inhale 1-2 puffs into the lungs every 6 (six) hours as needed for wheezing or shortness of breath.   ascorbic acid  500 MG tablet Commonly known as: VITAMIN C  Take 500 mg by mouth daily.   aspirin  EC 81 MG tablet Take 1 tablet (81 mg total) by mouth daily. Swallow whole. Start taking on: June 11, 2023   atorvastatin  40 MG tablet Commonly known as: LIPITOR Take 1 tablet (40 mg total) by mouth at bedtime.   azelastine 0.05 % ophthalmic solution Commonly known as: OPTIVAR Apply 1 drop to eye 2 (two) times daily.   clopidogrel  75 MG tablet Commonly known as: PLAVIX  Take 1 tablet (75 mg total) by mouth daily. Start taking on: June 11, 2023   colestipol 1 g tablet Commonly known as: COLESTID Take 1 g by mouth daily.   cyclobenzaprine  10 MG tablet Commonly known as: FLEXERIL  TAKE 1 TABLET BY MOUTH AT BEDTIME AS NEEDED FOR MUSCLE SPASMS.   dicyclomine  20 MG tablet Commonly known as: BENTYL  Take 20 mg by mouth 3 (three) times daily before meals.   diphenoxylate -atropine  2.5-0.025 MG tablet Commonly known as: LOMOTIL  Take 2 tablets by mouth daily.   fluticasone  50 MCG/ACT nasal spray Commonly known as: FLONASE  PLACE 1 SPRAY INTO BOTH NOSTRILS 2 (TWO) TIMES DAILY   hydrochlorothiazide  25 MG tablet Commonly known as: HYDRODIURIL  Take 1 tablet (25 mg total) by mouth daily.   levothyroxine  200 MCG tablet Commonly known as: SYNTHROID  Take 1 tablet (200 mcg total) by mouth daily.   LORazepam  0.5 MG tablet Commonly known as: ATIVAN  TAKE 1 TABLET (0.5 MG) BY MOUTH AT BEDTIME AS NEEDED FOR ANXIETY   mirabegron  ER 25 MG Tb24 tablet Commonly known as: Myrbetriq  Take 1 tablet (25 mg total) by mouth  daily.   ondansetron  8 MG tablet Commonly known as: ZOFRAN  Take 1 tablet by mouth every 8 hours as needed for nausea or vomiting.   QC TUMERIC COMPLEX PO Take 1 capsule by mouth daily.   vitamin B-12 100 MCG tablet Commonly known as: CYANOCOBALAMIN  Take 100 mcg by mouth daily.   VITAMIN D3 PO Take 1 tablet by mouth daily.   zinc  gluconate 50 MG tablet Take 50 mg by mouth daily.        Discharge Exam: Filed Weights   06/09/23 1236  Weight: 102.1 kg  HEENT:  [place]/AT, No thrush, no icterus CV:  RRR, no rub, no S3, no S4 Lung:  CTA, no wheeze, no rhonchi Abd:  soft/+BS, NT Ext:  No edema, no lymphangitis, no synovitis, no rash  Neuro:  CN II-XII intact, strength 4/5 in RUE, RLE, strength 4/5 LUE, LLE; sensation intact bilateral; no dysmetria; babinski equivocal   Condition at discharge: stable  The results of significant diagnostics from this hospitalization (including imaging, microbiology, ancillary and laboratory) are listed below for reference.   Imaging  Studies: ECHOCARDIOGRAM COMPLETE Result Date: 06/10/2023    ECHOCARDIOGRAM REPORT   Patient Name:   KIYO HEAL Date of Exam: 06/10/2023 Medical Rec #:  [id number]   Height:       61.0 in Accession #:    [id number]  Weight:       225.0 lb Date of Birth:  Aug 11, 1965   BSA:          1.986 m Patient Age:    57 years    BP:           138/80 mmHg Patient Gender: F           HR:           92 bpm. Exam Location:  [facility] Procedure: 2D Echo, Cardiac Doppler and Color Doppler Indications:    Stroke I63.9  History:        Patient has prior history of Echocardiogram examinations, most                 recent 11/20/2015. Stroke; Risk Factors:Hypertension and                 Non-Smoker.  Sonographer:    Tillman Nora RVT RCS Referring Phys: [phone] TULLY BRAVO [facility]  Sonographer Comments: Technically challenging study due to limited acoustic windows, Technically difficult study due to poor echo windows, suboptimal parasternal window, suboptimal  apical window, suboptimal subcostal window and patient is obese. Image acquisition challenging due to patient body habitus and Image acquisition challenging due to uncooperative patient. IMPRESSIONS  1. Left ventricular ejection fraction, by estimation, is 60 to 65%. The left ventricle has normal function. Left ventricular endocardial border not optimally defined to evaluate regional wall motion. There is moderate left ventricular hypertrophy. Left ventricular diastolic parameters are consistent with Grade I diastolic dysfunction (impaired relaxation).  2. Right ventricular systolic function is normal. The right ventricular size is normal. Tricuspid regurgitation signal is inadequate for assessing PA pressure.  3. Left atrial size was mildly dilated.  4. The mitral valve was not well visualized. No evidence of mitral valve regurgitation. No evidence of mitral stenosis.  5. The aortic valve was not well visualized. Aortic valve regurgitation is not visualized. No aortic stenosis is present.  6. Aortic dilatation noted. There is mild dilatation of the ascending aorta, measuring 37 mm.  7. The inferior vena cava is normal in size with greater than 50% respiratory variability, suggesting right atrial pressure of 3 mmHg. FINDINGS  Left Ventricle: Left ventricular ejection fraction, by estimation, is 60 to 65%. The left ventricle has normal function. Left ventricular endocardial border not optimally defined to evaluate regional wall motion. The left ventricular internal cavity size was normal in size. There is moderate left ventricular hypertrophy. Left ventricular diastolic parameters are consistent with Grade I diastolic dysfunction (impaired relaxation). Normal left ventricular filling pressure. Right Ventricle: The right ventricular size is normal. Right vetricular wall thickness was not well visualized. Right ventricular systolic function is normal. Tricuspid regurgitation signal is inadequate for assessing PA  pressure. Left Atrium: Left atrial size was mildly dilated. Right Atrium: Right atrial size was normal in size. Pericardium: There is no evidence of pericardial effusion. Mitral Valve: The mitral valve was not well visualized. No evidence of mitral valve regurgitation. No evidence of mitral valve stenosis. Tricuspid Valve: The tricuspid valve is not well visualized. Tricuspid valve regurgitation is not demonstrated. No evidence of tricuspid stenosis. Aortic Valve: The aortic valve was not well visualized. Aortic valve regurgitation is  not visualized. No aortic stenosis is present. Aortic valve mean gradient measures 4.0 mmHg. Aortic valve peak gradient measures 8.0 mmHg. Aortic valve area, by VTI measures 2.40 cm. Pulmonic Valve: The pulmonic valve was not well visualized. Pulmonic valve regurgitation is not visualized. No evidence of pulmonic stenosis. Aorta: The aortic root is normal in size and structure and aortic dilatation noted. There is mild dilatation of the ascending aorta, measuring 37 mm. Venous: The inferior vena cava is normal in size with greater than 50% respiratory variability, suggesting right atrial pressure of 3 mmHg. IAS/Shunts: No atrial level shunt detected by color flow Doppler.  LEFT VENTRICLE PLAX 2D LVIDd:         4.60 cm   Diastology LVIDs:         3.30 cm   LV e' medial:    5.03 cm/s LV PW:         1.30 cm   LV E/e' medial:  12.4 LV IVS:        1.30 cm   LV e' lateral:   7.30 cm/s LVOT diam:     1.80 cm   LV E/e' lateral: 8.5 LV SV:         55 LV SV Index:   28 LVOT Area:     2.54 cm  RIGHT VENTRICLE RV S prime:     12.60 cm/s TAPSE (M-mode): 2.0 cm LEFT ATRIUM             Index        RIGHT ATRIUM          Index LA diam:        3.90 cm 1.96 cm/m   RA Area:     8.76 cm LA Vol (A2C):   66.4 ml 33.44 ml/m  RA Volume:   14.80 ml 7.45 ml/m LA Vol (A4C):   76.8 ml 38.68 ml/m LA Biplane Vol: 73.9 ml 37.22 ml/m  AORTIC VALVE                    PULMONIC VALVE AV Area (Vmax):    2.35 cm      PV Vmax:       1.10 m/s AV Area (Vmean):   2.40 cm     PV Peak grad:  4.8 mmHg AV Area (VTI):     2.40 cm AV Vmax:           141.00 cm/s AV Vmean:          95.400 cm/s AV VTI:            0.230 m AV Peak Grad:      8.0 mmHg AV Mean Grad:      4.0 mmHg LVOT Vmax:         130.00 cm/s LVOT Vmean:        90.000 cm/s LVOT VTI:          0.217 m LVOT/AV VTI ratio: 0.94  AORTA Ao Root diam: 2.50 cm Ao Asc diam:  3.70 cm MITRAL VALVE MV Area (PHT): 5.13 cm     SHUNTS MV Decel Time: 148 msec     Systemic VTI:  0.22 m MV E velocity: 62.40 cm/s   Systemic Diam: 1.80 cm MV A velocity: 111.00 cm/s MV E/A ratio:  0.56 Dorn Ross MD Electronically signed by Dorn Ross MD Signature Date/Time: 06/10/2023/3:36:02 PM    Final    MR BRAIN WO CONTRAST Result Date: 06/09/2023 CLINICAL DATA:  Stroke suspected EXAM: MRI  HEAD WITHOUT CONTRAST TECHNIQUE: Multiplanar, multiecho pulse sequences of the brain and surrounding structures were obtained without intravenous contrast. COMPARISON:  No prior MRI available, correlation is made with 06/09/2023 CT head FINDINGS: Brain: Restricted diffusion with ADC correlate in the left pons (series 5, images 14-15), which is associated with increased T2 hyperintense signal. Additional increased signal on diffusion-weighted imaging and T2 weighted sequences without definite ADC correlate in the bilateral middle cerebellar peduncles (series 5, image 12-13). No acute hemorrhage, mass, mass effect, or midline shift. No hydrocephalus or extra-axial collection. Pituitary and craniocervical junction within normal limits. Foci of hemosiderin deposition in the pons, likely sequela of remote hypertensive microhemorrhages. Scattered T2 hyperintense signal in the periventricular white matter, likely the sequela of mild chronic small vessel ischemic disease. Vascular: Normal arterial flow voids. Skull and upper cervical spine: Normal marrow signal. Sinuses/Orbits: Mucosal thickening in the right  maxillary sinus with air-fluid level. No acute finding in the orbits. Status post bilateral lens replacements. No acute finding in the orbits. Other: The mastoid air cells are well aerated. IMPRESSION: 1. Acute infarct in the left pons. 2. Additional increased signal in the bilateral middle cerebellar peduncles on diffusion-weighted imaging and T2 weighted sequences without definite ADC correlate. This middle cerebellar peduncle sign  is nonspecific and favored to represent subacute infarcts or wallerian degeneration after pontine infarct, although the differential is broad and contains metabolic, neoplastic and inflammatory conditions, among others. The arc sign, with increased T2 signal extending from one middle cerebellar peduncle across the pons into the other, can be seen in CARASIL. Atypical PRES is also a consideration. 3. Mucosal thickening in the right maxillary sinus with air-fluid level, which can be seen in the setting of acute sinusitis. These findings were discussed by telephone on 06/09/2023 at 5:13 pm with provider [facility]. Electronically Signed   By: Donald Campion M.D.   On: 06/09/2023 17:14   CT ANGIO HEAD NECK W WO CM (CODE STROKE) Result Date: 06/09/2023 CLINICAL DATA:  Stroke, follow-up. Left arm and leg numbness. Dizziness. EXAM: CT ANGIOGRAPHY HEAD AND NECK WITH AND WITHOUT CONTRAST TECHNIQUE: Multidetector CT imaging of the head and neck was performed using the standard protocol during bolus administration of intravenous contrast. Multiplanar CT image reconstructions and MIPs were obtained to evaluate the vascular anatomy. Carotid stenosis measurements (when applicable) are obtained utilizing NASCET criteria, using the distal internal carotid diameter as the denominator. RADIATION DOSE REDUCTION: This exam was performed according to the departmental dose-optimization program which includes automated exposure control, adjustment of the mA and/or kV according to patient size and/or use  of iterative reconstruction technique. CONTRAST:  75mL OMNIPAQUE  IOHEXOL  350 MG/ML SOLN COMPARISON:  CT head without contrast 06/09/2023 FINDINGS: CTA NECK FINDINGS Aortic arch: A 3 vessel arch configuration is present. No significant atherosclerotic changes are present. No aneurysm or dissection is present. Right carotid system: Right common carotid artery is within normal limits. The bifurcation is unremarkable. The cervical right ICA is normal. Left carotid system: The left carotid artery is within normal limits. Bifurcation is unremarkable. The cervical left ICA is normal. Vertebral arteries: The vertebral arteries are codominant. Both vertebral arteries originate from the subclavian arteries without significant stenosis. No significant stenosis is present in either vertebral artery in the neck. Skeleton: The vertebral body heights and alignment are normal. No focal osseous lesions are present. The patient is edentulous. Other neck: Soft tissues the neck are otherwise unremarkable. Salivary glands are within normal limits. Thyroid  is normal. No significant adenopathy is  present. No focal mucosal or submucosal lesions are present. Upper chest: The lung apices are clear. The thoracic inlet is within normal limits. Review of the MIP images confirms the above findings CTA HEAD FINDINGS Anterior circulation: Atherosclerotic calcifications are present within the cavernous left internal carotid artery. No significant stenosis is present in either internal carotid artery through the ICA termini. The right A1 segment is dominant. Hypoplastic left A1 segment is present. The anterior communicating artery is patent. M1 segments are normal bilaterally. The MCA bifurcations are within normal limits bilaterally. Mild distal segmental narrowing is present within ACA and MCA branch vessels without a significant proximal stenosis or occlusion. No aneurysm is present. Posterior circulation: The PICA origins are visualized and  normal. The vertebrobasilar junction basilar artery normal. The superior cerebellar arteries are patent. Both posterior cerebral arteries originate from basilar tip. The PCA branch vessels are normal bilaterally. Venous sinuses: The dural sinuses are patent. The straight sinus and deep cerebral veins are intact. Cortical veins are within normal limits. No significant vascular malformation is evident. Anatomic variants: None Review of the MIP images confirms the above findings IMPRESSION: 1. No emergent large vessel occlusion. 2. Mild distal segmental narrowing within ACA and MCA branch vessels without a significant proximal stenosis or occlusion. This likely reflects intracranial atherosclerosis. 3. Normal CTA of the neck. Electronically Signed   By: Lonni Necessary M.D.   On: 06/09/2023 15:56   CT HEAD CODE STROKE WO CONTRAST Result Date: 06/09/2023 CLINICAL DATA:  Code stroke. Dizziness and headache for 2 days. Left-sided weakness began today. EXAM: CT HEAD WITHOUT CONTRAST TECHNIQUE: Contiguous axial images were obtained from the base of the skull through the vertex without intravenous contrast. RADIATION DOSE REDUCTION: This exam was performed according to the departmental dose-optimization program which includes automated exposure control, adjustment of the mA and/or kV according to patient size and/or use of iterative reconstruction technique. COMPARISON:  CT head without contrast 06/09/2023 at 2:39 p.m. FINDINGS: Brain: No acute infarct, hemorrhage, or mass lesion is present. No significant white matter lesions are present. Deep brain nuclei are within normal limits. The ventricles are of normal size. No significant extraaxial fluid collection is present. The brainstem and cerebellum are within normal limits. Midline structures are within normal limits. Vascular: Atherosclerotic calcifications are present within the left cavernous internal carotid artery. No hyperdense vessel is present. Skull:  Calvarium is intact. No focal lytic or blastic lesions are present. No significant extracranial soft tissue lesion is present. Sinuses/Orbits: Minimal fluid is again noted in the right maxillary sinus. The paranasal sinuses and mastoid air cells are otherwise clear. Bilateral lens replacements are noted. Globes and orbits are otherwise unremarkable. ASPECTS [facility] Stroke Program Early CT Score) - Ganglionic level infarction (caudate, lentiform nuclei, internal capsule, insula, M1-M3 cortex): 7/7 - Supraganglionic infarction (M4-M6 cortex): 3/3 Total score (0-10 with 10 being normal): 10/10 IMPRESSION: 1. Normal CT appearance of the brain. No acute or focal lesion to explain the patient's symptoms. 2. Aspects is 10/10. 3. Minimal right maxillary sinus disease. These results were called by telephone at the time of interpretation on 06/09/2023 at 3:49 pm to provider Dr. Mannie, who verbally acknowledged these results. Electronically Signed   By: Lonni Necessary M.D.   On: 06/09/2023 15:50   CT Head Wo Contrast Result Date: 06/09/2023 CLINICAL DATA:  New onset headache.  Hypertension.  Dizziness. EXAM: CT HEAD WITHOUT CONTRAST TECHNIQUE: Contiguous axial images were obtained from the base of the skull through the vertex without intravenous  contrast. RADIATION DOSE REDUCTION: This exam was performed according to the departmental dose-optimization program which includes automated exposure control, adjustment of the mA and/or kV according to patient size and/or use of iterative reconstruction technique. COMPARISON:  10/20/2016 FINDINGS: Brain: The brain shows a normal appearance without evidence of malformation, atrophy, old or acute small or large vessel infarction, mass lesion, hemorrhage, hydrocephalus or extra-axial collection. Vascular: No hyperdense vessel. No evidence of atherosclerotic calcification. Skull: Normal.  No traumatic finding.  No focal bone lesion. Sinuses/Orbits: Clear sinuses except for a  small air-fluid level and mild mucosal thickening in the right maxillary sinus. Other: None significant IMPRESSION: 1. Normal appearance of the brain itself. 2. Small air-fluid level and mild mucosal thickening in the right maxillary sinus. Correlate clinically for any signs or symptoms of acute sinusitis. Electronically Signed   By: Oneil Officer M.D.   On: 06/09/2023 14:53    Microbiology: Results for orders placed or performed during the hospital encounter of 06/09/23  C Difficile Quick Screen w PCR reflex     Status: None   Collection Time: 06/10/23  7:20 AM   Specimen: STOOL  Result Value Ref Range Status   C Diff antigen NEGATIVE NEGATIVE Final   C Diff toxin NEGATIVE NEGATIVE Final   C Diff interpretation No C. difficile detected.  Final    Comment: Performed at [facility], [address], [place], [place] [address]    Labs: CBC: Recent Labs  Lab 06/07/23 1503 06/09/23 1450  WBC 9.8 9.2  NEUTROABS 6.7  --   HGB 12.4 12.5  HCT 37.9 37.3  MCV 90 90.5  PLT 403 393   Basic Metabolic Panel: Recent Labs  Lab 06/07/23 1503 06/09/23 1450  NA 140 136  K 4.1 3.8  CL 98 100  CO2 27 28  GLUCOSE 106* 91  BUN 21 21*  CREATININE 0.77 0.77  CALCIUM  9.6 8.9   Liver Function Tests: Recent Labs  Lab 06/07/23 1503  AST 11  ALT 12  ALKPHOS 79  BILITOT 0.2  PROT 6.8  ALBUMIN 4.0   CBG: Recent Labs  Lab 06/09/23 1527  GLUCAP 96    Discharge time spent: greater than 30 minutes.  Signed: Alm Schneider, MD [facility] 06/10/2023

## 2023-06-10 NOTE — Evaluation (Signed)
 Occupational Therapy Evaluation Patient Details Name: Destiny [name] MRN: [id number] DOB: 1965/09/10 Today's Date: 06/10/2023   History of Present Illness Destiny [name] is a 58 y.o. female with medical history significant for stroke, hypertension, obesity, colon cancer.  Patient presented to the ED with complaints of headache, dizziness, and hypotension that started about 3 days ago.  She went to her outpatient providers office and her blood pressure was found to be high, she was told to monitor this and get back to them.   She denies slurred speech, she has been able to ambulate without difficulty, no weakness of extremities, no facial asymmetry.  At time of my evaluation, she tells me her speech is at baseline-and she had not noted any changes for arrival in the ED and in the ED.   She was diagnosed with COVID 9 days ago, symptoms mostly cough, nasal congestion ?? Paxlovid, steroids and doxycycline.  She has completed treatment and is back to baseline.   Clinical Impression   Pt agreeable to OT and PT evaluation. Pt appears to be at or near baseline function. Good B UE strength and coordination. Able to ambulate and dress independently. Pt is not recommended for further acute OT services and will be discharged to care of nursing staff for remaining length of stay.                Functional Status Assessment  Patient has not had a recent decline in their functional status  Equipment Recommendations  None recommended by OT           Precautions / Restrictions Precautions Precautions: Fall Restrictions Weight Bearing Restrictions Per Provider Order: No      Mobility Bed Mobility Overal bed mobility: Independent                  Transfers Overall transfer level: Independent                        Balance Overall balance assessment: Independent                                         ADL either performed or assessed with clinical judgement    ADL Overall ADL's : Independent                                             Vision Baseline Vision/History: 1 Wears glasses Ability to See in Adequate Light: 1 Impaired Patient Visual Report: No change from baseline Vision Assessment?: No apparent visual deficits     Perception Perception: Not tested       Praxis Praxis: Not tested       Pertinent Vitals/Pain Pain Assessment Pain Assessment: No/denies pain     Extremity/Trunk Assessment Upper Extremity Assessment Upper Extremity Assessment: Overall WFL for tasks assessed   Lower Extremity Assessment Lower Extremity Assessment: Defer to PT evaluation   Cervical / Trunk Assessment Cervical / Trunk Assessment: Normal   Communication Communication Communication: No apparent difficulties   Cognition Arousal: Alert Behavior During Therapy: WFL for tasks assessed/performed Overall Cognitive Status: Within Functional Limits for tasks assessed  Home Living Family/patient expects to be discharged to:: Private residence Living Arrangements: Spouse/significant other Available Help at Discharge: Friend(s);Available PRN/intermittently (lives with boyfriend who works the opposite shift) Type of Home: House Home Access: Stairs to enter;Ramped entrance Secretary/administrator of Steps: 4 Entrance Stairs-Rails: Can reach both Home Layout: One level     Bathroom Shower/Tub: Producer, Television/film/video: Handicapped height Bathroom Accessibility: No   Home Equipment: Agricultural Consultant (2 wheels);Cane - single point          Prior Functioning/Environment Prior Level of Function : Independent/Modified Independent             Mobility Comments: Works; 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 ADLs Comments: Independent                                Co-evaluation PT/OT/SLP Co-Evaluation/Treatment: Yes Reason for  Co-Treatment: To address functional/ADL transfers   OT goals addressed during session: ADL's and self-care                       End of Session    Activity Tolerance: Patient tolerated treatment well Patient left: in bed;with call bell/phone within reach;with family/visitor present  OT Visit Diagnosis: Unsteadiness on feet (R26.81);Other abnormalities of gait and mobility (R26.89);Other symptoms and signs involving the nervous system (R29.898)                Time: 0812-0822 OT Time Calculation (min): 10 min Charges:  OT General Charges $OT Visit: 1 Visit OT Evaluation $OT Eval Low Complexity: 1 Low  Linell Meldrum OT, MOT   Jayson Person 06/10/2023, 9:17 AM

## 2023-06-10 NOTE — Evaluation (Signed)
 Physical Therapy Evaluation Patient Details Name: Destiny [name] MRN: [id number] DOB: Apr 09, 1966 Today's Date: 06/10/2023  History of Present Illness  Destiny [name] is a 58 y.o. female with medical history significant for stroke, hypertension, obesity, colon cancer.  Patient presented to the ED with complaints of headache, dizziness, and hypotension that started about 3 days ago.  She went to her outpatient providers office and her blood pressure was found to be high, she was told to monitor this and get back to them.   She denies slurred speech, she has been able to ambulate without difficulty, no weakness of extremities, no facial asymmetry.  At time of my evaluation, she tells me her speech is at baseline-and she had not noted any changes for arrival in the ED and in the ED.   She was diagnosed with COVID 9 days ago, symptoms mostly cough, nasal congestion ?? Paxlovid, steroids and doxycycline.  She has completed treatment and is back to baseline.   Clinical Impression  Patient functioning near baseline for functional mobility and gait other than occasional drifting to the right, able to self correct without assistance and demonstrated good return for ambulating in room/hallways without loss of balance.  Plan:  Patient discharged from physical therapy to care of nursing for ambulation daily as tolerated for length of stay.          If plan is discharge home, recommend the following: Help with stairs or ramp for entrance   Can travel by private vehicle        Equipment Recommendations None recommended by PT  Recommendations for Other Services       Functional Status Assessment Patient has not had a recent decline in their functional status     Precautions / Restrictions Precautions Precautions: Fall Restrictions Weight Bearing Restrictions Per Provider Order: No      Mobility  Bed Mobility Overal bed mobility: Independent                  Transfers Overall transfer level:  Independent                      Ambulation/Gait Ambulation/Gait assistance: Modified independent (Device/Increase time) Gait Distance (Feet): 150 Feet Assistive device: None Gait Pattern/deviations: WFL(Within Functional Limits), Drifts right/left Gait velocity: decreased     General Gait Details: grossly WFL with occasional drifting to the right, able to self correct without assistance and demonstrates good return for ambulating in room/hallways without loss of balance  Stairs            Wheelchair Mobility     Tilt Bed    Modified Rankin (Stroke Patients Only)       Balance Overall balance assessment: Mild deficits observed, not formally tested                                           Pertinent Vitals/Pain Pain Assessment Pain Assessment: No/denies pain    Home Living Family/patient expects to be discharged to:: Private residence Living Arrangements: Spouse/significant other Available Help at Discharge: Friend(s);Available PRN/intermittently Type of Home: House Home Access: Stairs to enter;Ramped entrance Entrance Stairs-Rails: Can reach both Entrance Stairs-Number of Steps: 4   Home Layout: One level Home Equipment: Agricultural Consultant (2 wheels);Cane - single point      Prior Function Prior Level of Function : Independent/Modified Independent  Mobility Comments: Works; 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 ADLs Comments: Independent     Extremity/Trunk Assessment   Upper Extremity Assessment Upper Extremity Assessment: Defer to OT evaluation    Lower Extremity Assessment Lower Extremity Assessment: Overall WFL for tasks assessed    Cervical / Trunk Assessment Cervical / Trunk Assessment: Normal  Communication   Communication Communication: No apparent difficulties  Cognition Arousal: Alert Behavior During Therapy: WFL for tasks assessed/performed Overall Cognitive Status: Within Functional Limits for tasks  assessed                                          General Comments      Exercises     Assessment/Plan    PT Assessment Patient does not need any further PT services  PT Problem List         PT Treatment Interventions      PT Goals (Current goals can be found in the Care Plan section)  Acute Rehab PT Goals Patient Stated Goal: return home PT Goal Formulation: With patient/family Time For Goal Achievement: 06/10/23 Potential to Achieve Goals: Good    Frequency       Co-evaluation PT/OT/SLP Co-Evaluation/Treatment: Yes Reason for Co-Treatment: To address functional/ADL transfers PT goals addressed during session: Mobility/safety with mobility;Balance OT goals addressed during session: ADL's and self-care       AM-PAC PT 6 Clicks Mobility  Outcome Measure Help needed turning from your back to your side while in a flat bed without using bedrails?: None Help needed moving from lying on your back to sitting on the side of a flat bed without using bedrails?: None Help needed moving to and from a bed to a chair (including a wheelchair)?: None Help needed standing up from a chair using your arms (e.g., wheelchair or bedside chair)?: None Help needed to walk in hospital room?: None Help needed climbing 3-5 steps with a railing? : A Little 6 Click Score: 23    End of Session   Activity Tolerance: Patient tolerated treatment well Patient left: in bed;with call bell/phone within reach;with family/visitor present Nurse Communication: Mobility status PT Visit Diagnosis: Unsteadiness on feet (R26.81);Other abnormalities of gait and mobility (R26.89);Muscle weakness (generalized) (M62.81)    Time: 9198-9177 PT Time Calculation (min) (ACUTE ONLY): 21 min   Charges:   PT Evaluation $PT Eval Moderate Complexity: 1 Mod PT Treatments $Therapeutic Activity: 8-22 mins PT General Charges $$ ACUTE PT VISIT: 1 Visit         12:17 PM, 06/10/23 Lynwood Music, MPT Physical Therapist with [facility] [phone] office [phone] mobile phone

## 2023-06-10 NOTE — Progress Notes (Deleted)
 Patient has shown no deficits today from stroke.  IV removed and dc instructions reviewed.  Ride is here and will be transported by [facility] to entrance.

## 2023-06-10 NOTE — Consult Note (Signed)
 Received consult for rectal bleeding. Per attending, patient had hematochezia on January 3 and 4 but none since then.  She has a history of rectal adenocarcinoma stage III T3N1 in May 2017 status post neoadjuvant chemo/radiation completed September 2017 followed by [facility] September 2017 with end ileostomy; subsequently with ileostomy reversal in April 2018.  She has a history of chronic loose stools since her ileostomy surgery and reversal.  Patient completed colonoscopy in March 2024.  While op note not available, pathology results and letter to the patient from her gastroenterologist report for small adenomatous polyp tissue removed, rectum appeared healthy.  No signs of cancer.  Plans for repeat colonoscopy in 5 years.  Patient diagnosed with COVID on May 31, 2023.  Treated with doxycycline, prednisone , molnupiravir .  Presented to the ED with left upper and lower extremity weakness, dizziness.  MRI brain showed acute infarct in the left pons.  Aspirin  81 mg and Plavix  75 mg daily recommended.  Discussed with Dr. Cinderella today.  Communicated with Dr. Evonnie electronically.  Patient presented with history of hematochezia last week which has resolved.  Chronic loose stools since her ileostomy surgery and reversal.  Colonoscopy up-to-date.  Stool Hemoccult negative.  C. difficile quick screen negative.  GI pathogen panel pending.  Given acute stroke, would not recommend colonoscopy within the next 3 to 6 months if possible.  Would consider only if more urgent/emergent issue such as significant drop in hemoglobin, significant GI bleeding.  Would monitor for further bleeding in the setting of aspirin  and antiplatelet therapy but recent bleeding would not be an absolute contraindication in the setting of recommended anticoagulation/antiplatelet therapy.  Would recommend she follow-up with her primary gastroenterologist at [facility] upon discharge.    If patient develops any significant anemia or bleeding during  admission, please feel free to request formal GI consult.  Sonny RAMAN. Ezzard RIGGERS [facility] [phone] 1/9/20253:28 PM

## 2023-06-10 NOTE — Care Management CC44 (Signed)
 Condition Code 44 Documentation Completed  Patient Details  Name: Destiny [name] MRN: [id number] Date of Birth: [birth year]   Condition Code 44 given:  Yes Patient signature on Condition Code 44 notice:  Yes Documentation of 2 MD's agreement:  Yes Code 44 added to claim:  Yes    Rollo Petri, LCSW 06/10/2023, 4:45 PM

## 2023-06-10 NOTE — Progress Notes (Signed)
   06/10/23 0939  TOC Brief Assessment  Insurance and Status Reviewed  Patient has primary care physician Yes  Home environment has been reviewed from home  Prior level of function: independent  Prior/Current Home Services No current home services  Social Drivers of Health Review SDOH reviewed no interventions necessary  Readmission risk has been reviewed Yes  Transition of care needs no transition of care needs at this time    Transition of Care Department [facility]) has reviewed patient and no TOC needs have been identified at this time. We will continue to monitor patient advancement through interdisciplinary progression rounds. If new patient transition needs arise, please place a TOC consult.

## 2023-06-10 NOTE — Telephone Encounter (Signed)
 Copied from CRM [id number]. Topic: Clinical - Medical Advice >> Jun 10, 2023  9:22 AM Alfonso ORN wrote: Reason for CRM: patient want Dr. Fonda Dettinger patient had a stroke 2 days ago , patient blood pressure was real high it was 190/110 patient is admitted to [facility]  Patient had an appointment with dr Dettinger about her blood pressure early this week and was told to keep an eye on it

## 2023-06-10 NOTE — Progress Notes (Addendum)
 PROGRESS NOTE  Destiny [name] FMW:[id number] DOB: 07-17-1965 DOA: 06/09/2023 PCP: Dettinger, Fonda LABOR, MD  Brief History:  58 y/o female with history of hypertension, rectal cancer, stroke, and hypothyroidism presenting with dizziness and vertigo that began on 06/07/2023.  The patient went to see her PCP.  She was noted to have elevated blood pressure in the office.  She was told to follow her blood pressure for the next several days and if remains elevated to follow back up.  On 06/09/2023, the patient had some numbness in her left hand and left foot with some decreased grip strength.  She checked her blood pressure at home and it was 195/103.  As result, she presented for further evaluation and treatment.  She denied any visual disturbance, slurred speech, chest pain, shortness breath, abdominal pain.  There is no dysuria or hematuria.  She denies any fevers, chills, headache, neck pain.  Notably, the patient has chronic loose stools.  She states that she has about 5 stools on a daily basis.  Since her ileostomy surgery and reversal.  She noted a number of episodes of large-volume hematochezia on 06/04/2023 and 06/05/2023.  She continues to have loose stool since then without any further blood.  Notably, the patient went to urgent care on 05/31/2023 and was diagnosed with COVID-19.  She was placed on doxycycline, prednisone , and molnupiravir .  She states that her symptoms have since improved.  In the ED, the patient was afebrile hemodynamically stable with oxygen  saturation 97% room air.  WBC 9.2, hemoglobin 12.5, platelets 393.  Sodium 136, potassium 3.8, bicarbonate 28, serum creatinine 0.77.  TSH 8.463.  CT brain negative for acute findings.  MRI brain shows an acute infarct in the left pons.  CTA head and neck was negative for LVO.  Showed mild distal narrowing in the ACA and MCA branch vessels.  There is no hemodynamic significant stenosis in the neck.  The patient was started on aspirin  Plavix .    Assessment/Plan: Acute ischemic stroke -Likely small vessel disease -Appreciate Neurology Consult -PT/OT evaluation -Speech therapy eval -CT brain--neg -MRI brain--acute infarct left pons; increased signal bilateral middle cerebellar peduncles -CTA H&N--no hemodynamically significant stenosis in the carotids.  Negative LVO.  Mild distal narrowing ACA and MCA branch vessels. -Echo-- -LDL--115 -HbA1C-- -Antiplatelet--ASA 81 and plavix  75 mg daily  Essential hypertension -Holding HCTZ temporarily to allow for permissive hypertension  Hematochezia -Patient had hematochezia 07/01/2023 and 06/05/2023 -Hemoglobin stable -Monitor with the patient starting aspirin  and Plavix   Diarrhea -Chronic since the patient's ileocolic anastomosis -Stool pathogen panel -Check C. Difficile  Hypothyroidism -Continue Synthroid  -TSH 8.463 -Repeat TSH in 3 to 4 weeks  Morbid obesity -BMI 42.51 -Left modification  Stage III rectal cancer -Currently in remission -Patient follows Dr. Cloretta -Last colonoscopy 08/26/2022 with adenomatous polyps removed          Family Communication:   daughter updated at bedside 1/9  Consultants:  neurology, GI  Code Status:  FULL   DVT Prophylaxis:   Ruthven Lovenox    Procedures: As Listed in Progress Note Above  Antibiotics: None        Subjective: Patient states that her left-sided numbness and heaviness are improving.  She denies any fevers, chills, chest pain, shortness breath, nausea, vomiting, diarrhea, abdominal pain.  She denies any blurry vision or dysarthria.  Objective: Vitals:   06/09/23 1815 06/09/23 2010 06/09/23 2351 06/10/23 0422  BP: (!) 161/108 (!) 157/86 139/82 (!) 161/82  Pulse: 84 95 94 (!) 103  Resp: 19 20 20 20   Temp:  97.6 F (36.4 C) 98.2 F (36.8 C) 97.6 F (36.4 C)  TempSrc:    Oral  SpO2: 96% 95% 95% 97%  Weight:      Height:       No intake or output data in the 24 hours ending 06/10/23 0815 Weight  change:  Exam:  General:  Pt is alert, follows commands appropriately, not in acute distress HEENT: No icterus, No thrush, No neck mass, [place]/AT Cardiovascular: RRR, S1/S2, no rubs, no gallops Respiratory: CTA bilaterally, no wheezing, no crackles, no rhonchi Abdomen: Soft/+BS, non tender, non distended, no guarding Extremities: No edema, No lymphangitis, No petechiae, No rashes, no synovitis Neuro:  CN II-XII intact, strength 4/5 in RUE, RLE, strength 4/5 LUE, LLE; sensation intact bilateral; no dysmetria; babinski equivocal    Data Reviewed: I have personally reviewed following labs and imaging studies Basic Metabolic Panel: Recent Labs  Lab 06/07/23 1503 06/09/23 1450  NA 140 136  K 4.1 3.8  CL 98 100  CO2 27 28  GLUCOSE 106* 91  BUN 21 21*  CREATININE 0.77 0.77  CALCIUM  9.6 8.9   Liver Function Tests: Recent Labs  Lab 06/07/23 1503  AST 11  ALT 12  ALKPHOS 79  BILITOT 0.2  PROT 6.8  ALBUMIN 4.0   No results for input(s): LIPASE, AMYLASE in the last 168 hours. No results for input(s): AMMONIA in the last 168 hours. Coagulation Profile: No results for input(s): INR, PROTIME in the last 168 hours. CBC: Recent Labs  Lab 06/07/23 1503 06/09/23 1450  WBC 9.8 9.2  NEUTROABS 6.7  --   HGB 12.4 12.5  HCT 37.9 37.3  MCV 90 90.5  PLT 403 393   Cardiac Enzymes: No results for input(s): CKTOTAL, CKMB, CKMBINDEX, TROPONINI in the last 168 hours. BNP: Invalid input(s): POCBNP CBG: Recent Labs  Lab 06/09/23 1527  GLUCAP 96   HbA1C: Recent Labs    06/09/23 1659  HGBA1C 6.3*   Urine analysis:    Component Value Date/Time   COLORURINE YELLOW 02/19/2022 1336   APPEARANCEUR Cloudy (A) 02/18/2023 1228   LABSPEC 1.023 02/19/2022 1336   LABSPEC 1.005 07/09/2016 1038   PHURINE 5.5 02/19/2022 1336   GLUCOSEU Negative 02/18/2023 1228   GLUCOSEU Negative 07/09/2016 1038   HGBUR NEGATIVE 02/19/2022 1336   BILIRUBINUR Negative 02/18/2023 1228    BILIRUBINUR Negative 07/09/2016 1038   KETONESUR NEGATIVE 02/19/2022 1336   PROTEINUR 2+ (A) 02/18/2023 1228   PROTEINUR NEGATIVE 02/19/2022 1336   UROBILINOGEN 0.2 07/09/2016 1038   NITRITE Positive (A) 02/18/2023 1228   NITRITE POSITIVE (A) 02/19/2022 1336   LEUKOCYTESUR 3+ (A) 02/18/2023 1228   LEUKOCYTESUR LARGE (A) 02/19/2022 1336   LEUKOCYTESUR Moderate 07/09/2016 1038   Sepsis Labs: @LABRCNTIP (procalcitonin:4,lacticidven:4) )No results found for this or any previous visit (from the past 240 hours).   Scheduled Meds:   stroke: early stages of recovery book   Does not apply Once   aspirin  EC  81 mg Oral Daily   clopidogrel   75 mg Oral Daily   dicyclomine   20 mg Oral TID AC   enoxaparin  (LOVENOX ) injection  0.5 mg/kg Subcutaneous Q24H   fluticasone   1 spray Each Nare BID   levothyroxine   200 mcg Oral Q0600   LORazepam   0.5 mg Intravenous Once   mirabegron  ER  25 mg Oral Daily   Continuous Infusions:  Procedures/Studies: MR BRAIN WO CONTRAST Result Date: 06/09/2023  CLINICAL DATA:  Stroke suspected EXAM: MRI HEAD WITHOUT CONTRAST TECHNIQUE: Multiplanar, multiecho pulse sequences of the brain and surrounding structures were obtained without intravenous contrast. COMPARISON:  No prior MRI available, correlation is made with 06/09/2023 CT head FINDINGS: Brain: Restricted diffusion with ADC correlate in the left pons (series 5, images 14-15), which is associated with increased T2 hyperintense signal. Additional increased signal on diffusion-weighted imaging and T2 weighted sequences without definite ADC correlate in the bilateral middle cerebellar peduncles (series 5, image 12-13). No acute hemorrhage, mass, mass effect, or midline shift. No hydrocephalus or extra-axial collection. Pituitary and craniocervical junction within normal limits. Foci of hemosiderin deposition in the pons, likely sequela of remote hypertensive microhemorrhages. Scattered T2 hyperintense signal in the  periventricular white matter, likely the sequela of mild chronic small vessel ischemic disease. Vascular: Normal arterial flow voids. Skull and upper cervical spine: Normal marrow signal. Sinuses/Orbits: Mucosal thickening in the right maxillary sinus with air-fluid level. No acute finding in the orbits. Status post bilateral lens replacements. No acute finding in the orbits. Other: The mastoid air cells are well aerated. IMPRESSION: 1. Acute infarct in the left pons. 2. Additional increased signal in the bilateral middle cerebellar peduncles on diffusion-weighted imaging and T2 weighted sequences without definite ADC correlate. This middle cerebellar peduncle sign  is nonspecific and favored to represent subacute infarcts or wallerian degeneration after pontine infarct, although the differential is broad and contains metabolic, neoplastic and inflammatory conditions, among others. The arc sign, with increased T2 signal extending from one middle cerebellar peduncle across the pons into the other, can be seen in CARASIL. Atypical PRES is also a consideration. 3. Mucosal thickening in the right maxillary sinus with air-fluid level, which can be seen in the setting of acute sinusitis. These findings were discussed by telephone on 06/09/2023 at 5:13 pm with provider [facility]. Electronically Signed   By: Donald Campion M.D.   On: 06/09/2023 17:14   CT ANGIO HEAD NECK W WO CM (CODE STROKE) Result Date: 06/09/2023 CLINICAL DATA:  Stroke, follow-up. Left arm and leg numbness. Dizziness. EXAM: CT ANGIOGRAPHY HEAD AND NECK WITH AND WITHOUT CONTRAST TECHNIQUE: Multidetector CT imaging of the head and neck was performed using the standard protocol during bolus administration of intravenous contrast. Multiplanar CT image reconstructions and MIPs were obtained to evaluate the vascular anatomy. Carotid stenosis measurements (when applicable) are obtained utilizing NASCET criteria, using the distal internal carotid diameter as  the denominator. RADIATION DOSE REDUCTION: This exam was performed according to the departmental dose-optimization program which includes automated exposure control, adjustment of the mA and/or kV according to patient size and/or use of iterative reconstruction technique. CONTRAST:  75mL OMNIPAQUE  IOHEXOL  350 MG/ML SOLN COMPARISON:  CT head without contrast 06/09/2023 FINDINGS: CTA NECK FINDINGS Aortic arch: A 3 vessel arch configuration is present. No significant atherosclerotic changes are present. No aneurysm or dissection is present. Right carotid system: Right common carotid artery is within normal limits. The bifurcation is unremarkable. The cervical right ICA is normal. Left carotid system: The left carotid artery is within normal limits. Bifurcation is unremarkable. The cervical left ICA is normal. Vertebral arteries: The vertebral arteries are codominant. Both vertebral arteries originate from the subclavian arteries without significant stenosis. No significant stenosis is present in either vertebral artery in the neck. Skeleton: The vertebral body heights and alignment are normal. No focal osseous lesions are present. The patient is edentulous. Other neck: Soft tissues the neck are otherwise unremarkable. Salivary glands are within normal limits.  Thyroid  is normal. No significant adenopathy is present. No focal mucosal or submucosal lesions are present. Upper chest: The lung apices are clear. The thoracic inlet is within normal limits. Review of the MIP images confirms the above findings CTA HEAD FINDINGS Anterior circulation: Atherosclerotic calcifications are present within the cavernous left internal carotid artery. No significant stenosis is present in either internal carotid artery through the ICA termini. The right A1 segment is dominant. Hypoplastic left A1 segment is present. The anterior communicating artery is patent. M1 segments are normal bilaterally. The MCA bifurcations are within normal  limits bilaterally. Mild distal segmental narrowing is present within ACA and MCA branch vessels without a significant proximal stenosis or occlusion. No aneurysm is present. Posterior circulation: The PICA origins are visualized and normal. The vertebrobasilar junction basilar artery normal. The superior cerebellar arteries are patent. Both posterior cerebral arteries originate from basilar tip. The PCA branch vessels are normal bilaterally. Venous sinuses: The dural sinuses are patent. The straight sinus and deep cerebral veins are intact. Cortical veins are within normal limits. No significant vascular malformation is evident. Anatomic variants: None Review of the MIP images confirms the above findings IMPRESSION: 1. No emergent large vessel occlusion. 2. Mild distal segmental narrowing within ACA and MCA branch vessels without a significant proximal stenosis or occlusion. This likely reflects intracranial atherosclerosis. 3. Normal CTA of the neck. Electronically Signed   By: Lonni Necessary M.D.   On: 06/09/2023 15:56   CT HEAD CODE STROKE WO CONTRAST Result Date: 06/09/2023 CLINICAL DATA:  Code stroke. Dizziness and headache for 2 days. Left-sided weakness began today. EXAM: CT HEAD WITHOUT CONTRAST TECHNIQUE: Contiguous axial images were obtained from the base of the skull through the vertex without intravenous contrast. RADIATION DOSE REDUCTION: This exam was performed according to the departmental dose-optimization program which includes automated exposure control, adjustment of the mA and/or kV according to patient size and/or use of iterative reconstruction technique. COMPARISON:  CT head without contrast 06/09/2023 at 2:39 p.m. FINDINGS: Brain: No acute infarct, hemorrhage, or mass lesion is present. No significant white matter lesions are present. Deep brain nuclei are within normal limits. The ventricles are of normal size. No significant extraaxial fluid collection is present. The brainstem and  cerebellum are within normal limits. Midline structures are within normal limits. Vascular: Atherosclerotic calcifications are present within the left cavernous internal carotid artery. No hyperdense vessel is present. Skull: Calvarium is intact. No focal lytic or blastic lesions are present. No significant extracranial soft tissue lesion is present. Sinuses/Orbits: Minimal fluid is again noted in the right maxillary sinus. The paranasal sinuses and mastoid air cells are otherwise clear. Bilateral lens replacements are noted. Globes and orbits are otherwise unremarkable. ASPECTS [facility] Stroke Program Early CT Score) - Ganglionic level infarction (caudate, lentiform nuclei, internal capsule, insula, M1-M3 cortex): 7/7 - Supraganglionic infarction (M4-M6 cortex): 3/3 Total score (0-10 with 10 being normal): 10/10 IMPRESSION: 1. Normal CT appearance of the brain. No acute or focal lesion to explain the patient's symptoms. 2. Aspects is 10/10. 3. Minimal right maxillary sinus disease. These results were called by telephone at the time of interpretation on 06/09/2023 at 3:49 pm to provider Dr. Mannie, who verbally acknowledged these results. Electronically Signed   By: Lonni Necessary M.D.   On: 06/09/2023 15:50   CT Head Wo Contrast Result Date: 06/09/2023 CLINICAL DATA:  New onset headache.  Hypertension.  Dizziness. EXAM: CT HEAD WITHOUT CONTRAST TECHNIQUE: Contiguous axial images were obtained from the base of  the skull through the vertex without intravenous contrast. RADIATION DOSE REDUCTION: This exam was performed according to the departmental dose-optimization program which includes automated exposure control, adjustment of the mA and/or kV according to patient size and/or use of iterative reconstruction technique. COMPARISON:  10/20/2016 FINDINGS: Brain: The brain shows a normal appearance without evidence of malformation, atrophy, old or acute small or large vessel infarction, mass lesion, hemorrhage,  hydrocephalus or extra-axial collection. Vascular: No hyperdense vessel. No evidence of atherosclerotic calcification. Skull: Normal.  No traumatic finding.  No focal bone lesion. Sinuses/Orbits: Clear sinuses except for a small air-fluid level and mild mucosal thickening in the right maxillary sinus. Other: None significant IMPRESSION: 1. Normal appearance of the brain itself. 2. Small air-fluid level and mild mucosal thickening in the right maxillary sinus. Correlate clinically for any signs or symptoms of acute sinusitis. Electronically Signed   By: Oneil Officer M.D.   On: 06/09/2023 14:53    Alm Schneider, DO  [facility]  If 7PM-7AM, please contact night-coverage [email or web address] Password [email or web address] 06/10/2023, 8:15 AM   LOS: 1 day

## 2023-06-10 NOTE — Progress Notes (Signed)
 SLP Cancellation Note  Patient Details Name: Destiny [name] MRN: [id number] DOB: February 09, 1966   Cancelled treatment:       Reason Eval/Treat Not Completed: SLP screened, no needs identified, will sign off (Pt back to baseline from cognitive linguistic standpoint. MD stated no need for SLE at this time.)  Thank you,  Lamar Candy, CCC-SLP [phone]  Ronie Barnhart 06/10/2023, 4:07 PM

## 2023-06-10 NOTE — Care Management Obs Status (Signed)
 MEDICARE OBSERVATION STATUS NOTIFICATION   Patient Details  Name: Destiny [name] MRN: [id number] Date of Birth: [birth year]   Medicare Observation Status Notification Given:  Yes    Elliot Gault, LCSW 06/10/2023, 4:45 PM

## 2023-06-10 NOTE — Progress Notes (Addendum)
 I connected with  Destiny [name] on 06/10/23 by a video enabled telemedicine application and verified that I am speaking with the correct person using two identifiers.   I discussed the limitations of evaluation and management by telemedicine. The patient expressed understanding and agreed to proceed.  Location of patient: [facility] Location of physician: [facility]  Subjective: No acute events overnight.  States she is feeling much better today.  ROS: negative except above  Examination  Vital signs in last 24 hours: Temp:  [97.6 F (36.4 C)-98.2 F (36.8 C)] 97.6 F (36.4 C) (01/09 0422) Pulse Rate:  [80-103] 103 (01/09 0422) Resp:  [15-21] 20 (01/09 0422) BP: (139-169)/(82-122) 161/82 (01/09 0422) SpO2:  [93 %-97 %] 97 % (01/09 0422) Weight:  [102.1 kg] 102.1 kg (01/08 1236)  General: lying in bed, NAD Neuro: MS: Alert, oriented, follows commands CN: pupils equal and reactive,  EOMI, face symmetric, tongue midline, normal sensation over face, Motor: Antigravity strength in all 4 extremities Sensory: Intact to light touch Coordination: normal Gait: not tested  Basic Metabolic Panel: Recent Labs  Lab 06/07/23 1503 06/09/23 1450  NA 140 136  K 4.1 3.8  CL 98 100  CO2 27 28  GLUCOSE 106* 91  BUN 21 21*  CREATININE 0.77 0.77  CALCIUM  9.6 8.9    CBC: Recent Labs  Lab 06/07/23 1503 06/09/23 1450  WBC 9.8 9.2  NEUTROABS 6.7  --   HGB 12.4 12.5  HCT 37.9 37.3  MCV 90 90.5  PLT 403 393     Coagulation Studies: No results for input(s): LABPROT, INR in the last 72 hours.  Imaging personally reviewed  CTA head and neck with and without contrast 06/09/2023:  No emergent large vessel occlusion. Mild distal segmental narrowing within ACA and MCA branch vessels without a significant proximal stenosis or occlusion. This likely reflects intracranial atherosclerosis. Normal CTA of the neck.  MRI brain without contrast 06/21/2023:  Acute infarct in  the left pons.  Additional increased signal in the bilateral middle cerebellar peduncles on diffusion-weighted imaging and T2 weighted sequences without definite ADC correlate. This middle cerebellar peduncle sign  is nonspecific and favored to represent subacute infarcts or wallerian degeneration after pontine infarct, although the differential is broad and contains metabolic, neoplastic and inflammatory conditions, among others. The arc sign, with increased T2 signal extending from one middle cerebellar peduncle across the pons into the other, can be seen in CARASIL. Atypical PRES is also a consideration.    ASSESSMENT AND PLAN: 58 year old female who presented with headache, imbalance.  MRI brain showed left pontine stroke.  Additionally there was restricted diffusion without ADC correlate in bilateral cerebellum as described above  Acute ischemic stroke -Etiology: Likely small disease versus embolic  -Patient's - DAPT with Aspirin  81 mg daily and Plavix  75 mg daily for 3 weeks followed by monotherapy with aspirin  80 mg daily -Atorvastatin  40 mg daily -If TTE negative, recommend 30-day cardiac monitor to look for paroxysmal A-fib -Goal blood pressure: Normotension -PT/OT -Stroke education -Will repeat MRI brain without contrast in 1 month (order placed) to see if the cerebellar changes resolved -Follow-up with neurology in 3 months (order placed) -Discussed plan with patient Discussed plan with Dr. Evonnie via secure chat  I have spent a total of   36 minutes with the patient reviewing hospital notes,  test results, labs and examining the patient as well as establishing an assessment and plan that was discussed personally with the patient.  >  50% of time was spent in direct patient care.       Arlin Krebs Epilepsy [facility] For questions after 5pm please refer to AMION to reach the Neurologist on call

## 2023-06-10 NOTE — Progress Notes (Signed)
 PICC removed left upper arm.  Length 45 cm.  No bleeding at site.  Gauze and tegaderm placed.

## 2023-06-10 NOTE — Progress Notes (Signed)
*  PRELIMINARY RESULTS* Echocardiogram 2D Echocardiogram has been performed.  Destiny [name] 06/10/2023, 3:25 PM

## 2023-06-10 NOTE — Progress Notes (Signed)
 Showed no deficits from stroke today.  Echo completed.  IV removed and Dc instructions reviewed.  Transported by WC to main entrance for ride homel

## 2023-06-11 ENCOUNTER — Telehealth: Payer: Self-pay | Admitting: *Deleted

## 2023-06-11 ENCOUNTER — Ambulatory Visit (INDEPENDENT_AMBULATORY_CARE_PROVIDER_SITE_OTHER): Payer: 59 | Admitting: Family Medicine

## 2023-06-11 ENCOUNTER — Encounter: Payer: Self-pay | Admitting: Family Medicine

## 2023-06-11 ENCOUNTER — Inpatient Hospital Stay: Payer: 59

## 2023-06-11 VITALS — BP 200/98 | HR 85 | Ht 61.0 in | Wt 229.0 lb

## 2023-06-11 DIAGNOSIS — Z8673 Personal history of transient ischemic attack (TIA), and cerebral infarction without residual deficits: Secondary | ICD-10-CM | POA: Diagnosis not present

## 2023-06-11 DIAGNOSIS — I10 Essential (primary) hypertension: Secondary | ICD-10-CM

## 2023-06-11 DIAGNOSIS — E890 Postprocedural hypothyroidism: Secondary | ICD-10-CM | POA: Diagnosis not present

## 2023-06-11 DIAGNOSIS — I6302 Cerebral infarction due to thrombosis of basilar artery: Secondary | ICD-10-CM

## 2023-06-11 LAB — GASTROINTESTINAL PANEL BY PCR, STOOL (REPLACES STOOL CULTURE)

## 2023-06-11 LAB — URINE DRUGS OF ABUSE SCREEN W ALC, ROUTINE (REF LAB)
Amphetamines, Urine: NEGATIVE ng/mL
Barbiturate, Ur: NEGATIVE ng/mL
Benzodiazepine Quant, Ur: NEGATIVE ng/mL
Cannabinoid Quant, Ur: NEGATIVE ng/mL
Cocaine (Metab.): NEGATIVE ng/mL
Ethanol U, Quan: NEGATIVE %
Methadone Screen, Urine: NEGATIVE ng/mL
Opiate Quant, Ur: NEGATIVE ng/mL
Phencyclidine, Ur: NEGATIVE ng/mL
Propoxyphene, Urine: NEGATIVE ng/mL

## 2023-06-11 MED ORDER — LEVOTHYROXINE SODIUM 112 MCG PO TABS: 225.0000 ug | ORAL_TABLET | Freq: Every day | ORAL | 3 refills | Status: DC

## 2023-06-11 MED ORDER — AMLODIPINE BESYLATE 5 MG PO TABS: 5.0000 mg | ORAL_TABLET | Freq: Every day | ORAL | 1 refills | Status: DC

## 2023-06-11 NOTE — Telephone Encounter (Signed)
-----   Message from Onalee Hua Tat sent at 06/10/2023  5:51 PM EST ----- Regarding: 30 day monitor Good Afternoon,  Can you please set up a 30 day event monitor for this patient for stroke?  Results to Dr. Louanne Skye  Thank you,  Onalee Hua

## 2023-06-11 NOTE — Progress Notes (Signed)
 SUBJECTIVE: HPI: Destiny [name] is a 58 y.o. female comes in on 06/11/2023 for follow-up visit for CVA. She was admitted on 06/09/2023 and discharged on 06/10/2023. Last visit in office on Monday for dizziness and congestion after testing positive for COVID. Symptoms continued and worsened so she went to the Emergency Department. An MRI was performed and she was diagnosed with a PONS CVA. She reports feeling better and no similar symptoms prior to admission. She was prescribed Plavix  for 3 week and to take aspirin . Blood pressure remains elevated. She denies weakness, dizziness or balance issues.  HYPERTENSION: Currently takes HCTZ and her blood pressure today is 200/98. Denies any dizziness or weakness. No vision changes or headaches. No issues with medications.  HYPOTHYROIDISM: Currently taking Levothyroxine . Denies any changes with hair or heat and cold intolerance. No myalgia or pain.  PMH, Surgical, Family, and Social history reviewed and no change. Current Medications and allergies reviewed.  Review of Symptoms: Constitutional: No fever or chills Skin: No rash HEENT: No vision changes, Cardio: No chest pain or leg swelling Pulm: No SOB or cough. GU: No changes in urinary habits or characteristic. MSK: No back pain or gait issues. Neuro: No dizziness, slurred speech, or weakness. Psych: No agitation.  OBJECTIVE: Vitals: HR 85, BP 200.98, RR 16, SpO2 99% on Ra, Ht 5'1, Wt 103.9 kg, BMI 43.27  Physical Exam: Constitutional: No fatigue or diaphoresis General: Well developed, no distress noted Skin: Warm and dry Cardiovascular: Normal rate and rhythm, heart sounds,  Pulmonary: Normal breath sounds. Normal effort and no accessory muscle use. MSK: No swelling Neuro: A/A x 4/4. CN II-XII tested with no abnormalities, CN I and VII not tested. Coordination normal. Sensory and Motor with no abnormalities. Psych: Normal behavior.  ASSESSMENT: Appears to be doing better since admission for  STATUS POST CVA. Hypertension with documented BP, and Hypothyroidism issues discussed.  PLAN: Restart Amlodipine  5MG  tablet for Hypertension management. Increase levothyroxine  to 224MCG tablet daily. Check CBC with differential and CMP. Follow up appointment in 4 weeks and will recheck thyroid  panel at that time. Notify PCP if any symptoms come about or you start feeling worse.

## 2023-06-11 NOTE — Telephone Encounter (Signed)
 Pt enrolled in [employer] and order placed.

## 2023-06-11 NOTE — Progress Notes (Signed)
 BP (!) 200/98   Pulse 85   Ht 5' 1 (1.549 m)   Wt 229 lb (103.9 kg)   LMP  (LMP Unknown) Comment: FULL   SpO2 99%   BMI 43.27 kg/m    Subjective:   Patient ID: Destiny [name], female    DOB: [birth year], 58 y.o.   MRN: [id number]  HPI: Destiny [name] is a 58 y.o. female presenting on 06/11/2023 for Hospitalization Follow-up (CVA)   HPI Patient is coming in today for hospital follow-up for CVA.  She was diagnosed with a pons CVA.  She was admitted on 06/09/2023 and discharged on 06/10/2023.  Patient was seen 4 days ago in office and had some dizziness and congestion and was getting over COVID but then she said the dizziness continued to worsen and her blood pressure continued to go up and she went into the emergency department 2 days ago and had an MRI and was diagnosed with a pons CVA.  She says since leaving the hospital she is no longer having dizziness and feeling better.  They did start her on Plavix  for 3 weeks and to do the aspirin .  Her blood pressure continues to remain elevated at 200/98.  She denies any numbness or weakness outside of her normal.  She says she is not feeling like she is off balance at this point.  Hypertension Patient is currently on hctz, and their blood pressure today is 200/98. Patient denies any lightheadedness or dizziness. Patient denies headaches, blurred vision, chest pains, shortness of breath, or weakness. Denies any side effects from medication and is content with current medication.   Hypothyroidism recheck Patient is coming in for thyroid  recheck today as well. They deny any issues with hair changes or heat or cold problems or diarrhea or constipation. They deny any chest pain or palpitations. They are currently on levothyroxine  200 micrograms   Relevant past medical, surgical, family and social history reviewed and updated as indicated. Interim medical history since our last visit reviewed. Allergies and medications reviewed and updated.  Review of Systems   Constitutional:  Negative for chills and fever.  Eyes:  Negative for redness and visual disturbance.  Respiratory:  Negative for chest tightness and shortness of breath.   Cardiovascular:  Negative for chest pain and leg swelling.  Genitourinary:  Negative for difficulty urinating and dysuria.  Musculoskeletal:  Negative for back pain and gait problem.  Skin:  Negative for rash.  Neurological:  Negative for dizziness (She says she is feeling better today), speech difficulty, weakness, light-headedness, numbness and headaches.  Psychiatric/Behavioral:  Negative for agitation and behavioral problems.   All other systems reviewed and are negative.   Per HPI unless specifically indicated above   Allergies as of 06/11/2023       Reactions   Latex Itching, Rash        Medication List        Accurate as of June 11, 2023 11:21 AM. If you have any questions, ask your nurse or doctor.          acetaminophen  500 MG tablet Commonly known as: TYLENOL  Take 1,000 mg by mouth every 8 (eight) hours as needed for moderate pain.   albuterol  108 (90 Base) MCG/ACT inhaler Commonly known as: VENTOLIN  HFA Inhale 1-2 puffs into the lungs every 6 (six) hours as needed for wheezing or shortness of breath.   amLODipine  5 MG tablet Commonly known as: NORVASC  Take 1 tablet (5 mg total) by mouth daily.  Started by: Fonda LABOR Denaja Verhoeven   ascorbic acid  500 MG tablet Commonly known as: VITAMIN C  Take 500 mg by mouth daily.   aspirin  EC 81 MG tablet Take 1 tablet (81 mg total) by mouth daily. Swallow whole.   atorvastatin  40 MG tablet Commonly known as: LIPITOR Take 1 tablet (40 mg total) by mouth at bedtime.   azelastine 0.05 % ophthalmic solution Commonly known as: OPTIVAR Apply 1 drop to eye 2 (two) times daily.   clopidogrel  75 MG tablet Commonly known as: PLAVIX  Take 1 tablet (75 mg total) by mouth daily.   colestipol 1 g tablet Commonly known as: COLESTID Take 1 g by mouth  daily.   cyclobenzaprine  10 MG tablet Commonly known as: FLEXERIL  TAKE 1 TABLET BY MOUTH AT BEDTIME AS NEEDED FOR MUSCLE SPASMS.   dicyclomine  20 MG tablet Commonly known as: BENTYL  Take 20 mg by mouth 3 (three) times daily before meals.   diphenoxylate -atropine  2.5-0.025 MG tablet Commonly known as: LOMOTIL  Take 2 tablets by mouth daily.   fluticasone  50 MCG/ACT nasal spray Commonly known as: FLONASE  PLACE 1 SPRAY INTO BOTH NOSTRILS 2 (TWO) TIMES DAILY   hydrochlorothiazide  25 MG tablet Commonly known as: HYDRODIURIL  Take 1 tablet (25 mg total) by mouth daily.   levothyroxine  112 MCG tablet Commonly known as: SYNTHROID  Take 2 tablets (225 mcg total) by mouth daily before breakfast. What changed:  medication strength how much to take when to take this Changed by: Fonda LABOR Danee Soller   LORazepam  0.5 MG tablet Commonly known as: ATIVAN  TAKE 1 TABLET (0.5 MG) BY MOUTH AT BEDTIME AS NEEDED FOR ANXIETY   mirabegron  ER 25 MG Tb24 tablet Commonly known as: Myrbetriq  Take 1 tablet (25 mg total) by mouth daily.   ondansetron  8 MG tablet Commonly known as: ZOFRAN  Take 1 tablet by mouth every 8 hours as needed for nausea or vomiting.   QC TUMERIC COMPLEX PO Take 1 capsule by mouth daily.   vitamin B-12 100 MCG tablet Commonly known as: CYANOCOBALAMIN  Take 100 mcg by mouth daily.   VITAMIN D3 PO Take 1 tablet by mouth daily.   zinc  gluconate 50 MG tablet Take 50 mg by mouth daily.         Objective:   BP (!) 200/98   Pulse 85   Ht 5' 1 (1.549 m)   Wt 229 lb (103.9 kg)   LMP  (LMP Unknown) Comment: FULL   SpO2 99%   BMI 43.27 kg/m   Wt Readings from Last 3 Encounters:  06/11/23 229 lb (103.9 kg)  06/09/23 225 lb (102.1 kg)  06/07/23 231 lb (104.8 kg)    Physical Exam Vitals and nursing note reviewed.  Constitutional:      General: She is not in acute distress.    Appearance: She is well-developed. She is not diaphoretic.  Eyes:      Conjunctiva/sclera: Conjunctivae normal.  Cardiovascular:     Rate and Rhythm: Normal rate and regular rhythm.     Heart sounds: Normal heart sounds. No murmur heard. Pulmonary:     Effort: Pulmonary effort is normal. No respiratory distress.     Breath sounds: Normal breath sounds. No wheezing.  Musculoskeletal:        General: No swelling.  Skin:    General: Skin is warm and dry.     Findings: No rash.  Neurological:     Mental Status: She is alert and oriented to person, place, and time.     Cranial Nerves:  No cranial nerve deficit.     Sensory: No sensory deficit.     Motor: No weakness.     Coordination: Coordination normal.  Psychiatric:        Behavior: Behavior normal.       Assessment & Plan:   Problem List Items Addressed This Visit       Cardiovascular and Mediastinum   Essential hypertension, benign - Primary   Relevant Medications   amLODipine  (NORVASC ) 5 MG tablet   levothyroxine  (SYNTHROID ) 112 MCG tablet   Other Relevant Orders   CBC with Differential/Platelet   CMP14+EGFR     Endocrine   Hypothyroidism   Relevant Medications   amLODipine  (NORVASC ) 5 MG tablet   levothyroxine  (SYNTHROID ) 112 MCG tablet   Other Relevant Orders   CBC with Differential/Platelet   CMP14+EGFR   Other Visit Diagnoses       Status post CVA       Relevant Medications   amLODipine  (NORVASC ) 5 MG tablet   levothyroxine  (SYNTHROID ) 112 MCG tablet   Other Relevant Orders   CBC with Differential/Platelet   CMP14+EGFR       Will increase the levothyroxine  to 224 mcg daily and add amlodipine  for her blood pressure.  She says her dizziness is doing well today so no adjustments as far as.  Will do some blood work as well today. Follow up plan: Return in about 4 weeks (around 07/09/2023), or if symptoms worsen or fail to improve, for thyroid  recheck.  Counseling provided for all of the vaccine components Orders Placed This Encounter  Procedures   CBC with  Differential/Platelet   CMP14+EGFR    Fonda Levins, MD [facility] 06/11/2023, 11:21 AM

## 2023-06-12 LAB — CBC WITH DIFFERENTIAL/PLATELET
Basophils Absolute: 0 10*3/uL (ref 0.0–0.2)
Basos: 0 %
EOS (ABSOLUTE): 0.1 10*3/uL (ref 0.0–0.4)
Eos: 1 %
Hematocrit: 40.4 % (ref 34.0–46.6)
Hemoglobin: 13 g/dL (ref 11.1–15.9)
Immature Grans (Abs): 0.2 10*3/uL — ABNORMAL HIGH (ref 0.0–0.1)
Immature Granulocytes: 1 %
Lymphocytes Absolute: 2.5 10*3/uL (ref 0.7–3.1)
Lymphs: 21 %
MCH: 29.5 pg (ref 26.6–33.0)
MCHC: 32.2 g/dL (ref 31.5–35.7)
MCV: 92 fL (ref 79–97)
Monocytes Absolute: 1 10*3/uL — ABNORMAL HIGH (ref 0.1–0.9)
Monocytes: 8 %
Neutrophils Absolute: 8.4 10*3/uL — ABNORMAL HIGH (ref 1.4–7.0)
Neutrophils: 69 %
Platelets: 454 10*3/uL — ABNORMAL HIGH (ref 150–450)
RBC: 4.41 x10E6/uL (ref 3.77–5.28)
RDW: 13 % (ref 11.7–15.4)
WBC: 12.2 10*3/uL — ABNORMAL HIGH (ref 3.4–10.8)

## 2023-06-12 LAB — CMP14+EGFR
ALT: 14 [IU]/L (ref 0–32)
AST: 21 [IU]/L (ref 0–40)
Albumin: 4.4 g/dL (ref 3.8–4.9)
Alkaline Phosphatase: 66 [IU]/L (ref 44–121)
BUN/Creatinine Ratio: 31 — ABNORMAL HIGH (ref 9–23)
BUN: 25 mg/dL — ABNORMAL HIGH (ref 6–24)
Bilirubin Total: 0.3 mg/dL (ref 0.0–1.2)
CO2: 25 mmol/L (ref 20–29)
Calcium: 9.2 mg/dL (ref 8.7–10.2)
Chloride: 100 mmol/L (ref 96–106)
Creatinine, Ser: 0.81 mg/dL (ref 0.57–1.00)
Globulin, Total: 2.9 g/dL (ref 1.5–4.5)
Glucose: 102 mg/dL — ABNORMAL HIGH (ref 70–99)
Potassium: 4.2 mmol/L (ref 3.5–5.2)
Sodium: 141 mmol/L (ref 134–144)
Total Protein: 7.3 g/dL (ref 6.0–8.5)
eGFR: 85 mL/min/{1.73_m2} (ref >59)

## 2023-06-14 NOTE — Telephone Encounter (Signed)
 Copied from CRM [id number]. Topic: Clinical - Prescription Issue >> Jun 11, 2023  3:48 PM Carlatta H wrote: Reason for CRM: [facility] amLODipine  (NORVASC ) 5 MG tablet [[id number]]//She needs to prescription sent [facility] [address] [place], [place] [address]

## 2023-06-15 ENCOUNTER — Telehealth: Payer: Self-pay | Admitting: Family Medicine

## 2023-06-15 DIAGNOSIS — Z8673 Personal history of transient ischemic attack (TIA), and cerebral infarction without residual deficits: Secondary | ICD-10-CM

## 2023-06-15 DIAGNOSIS — E890 Postprocedural hypothyroidism: Secondary | ICD-10-CM

## 2023-06-15 DIAGNOSIS — I10 Essential (primary) hypertension: Secondary | ICD-10-CM

## 2023-06-15 NOTE — Telephone Encounter (Signed)
 Please review and advise.

## 2023-06-16 NOTE — Telephone Encounter (Signed)
 I do not see any blood pressure readings from her on my desk?  Does anybody have a copy of it?

## 2023-06-17 DIAGNOSIS — G459 Transient cerebral ischemic attack, unspecified: Secondary | ICD-10-CM | POA: Diagnosis not present

## 2023-06-17 MED ORDER — AMLODIPINE BESYLATE 10 MG PO TABS: 10.0000 mg | ORAL_TABLET | Freq: Every day | ORAL | 1 refills | Status: DC

## 2023-06-17 NOTE — Telephone Encounter (Signed)
Left message making pt aware and to call back if needed.

## 2023-06-17 NOTE — Telephone Encounter (Signed)
I found the papers for the blood pressure readings, please let her know that I want her to increase her amlodipine to 10 mg and I sent a new prescription for that.

## 2023-06-17 NOTE — Telephone Encounter (Signed)
Yes please let her know that today I sent in amlodipine 10 mg which is a higher dose for her I want her to start taking that daily.  She can double up on the amlodipine 5 mg that she has right now until she gets the new prescription.

## 2023-06-17 NOTE — Telephone Encounter (Signed)
Left message making pt aware of Dettinger's recommendations. Advised to give the 10mg  daily a week and to call back sooner if needed.

## 2023-06-17 NOTE — Telephone Encounter (Signed)
Pt called after hours service reports that she is having issues with high blood pressure, currently BP was 173/79, Hr 106

## 2023-06-18 ENCOUNTER — Ambulatory Visit (INDEPENDENT_AMBULATORY_CARE_PROVIDER_SITE_OTHER): Payer: 59

## 2023-06-18 ENCOUNTER — Telehealth: Payer: Self-pay

## 2023-06-18 VITALS — Ht 61.0 in | Wt 229.0 lb

## 2023-06-18 DIAGNOSIS — Z0000 Encounter for general adult medical examination without abnormal findings: Secondary | ICD-10-CM

## 2023-06-18 NOTE — Patient Instructions (Signed)
Destiny [name] , Thank you for taking time to come for your Medicare Wellness Visit. I appreciate your ongoing commitment to your health goals. Please review the following plan we discussed and let me know if I can assist you in the future.   Referrals/Orders/Follow-Ups/Clinician Recommendations: Aim for 30 minutes of exercise or brisk walking, 6-8 glasses of water, and 5 servings of fruits and vegetables each day.   This is a list of the screening recommended for you and due dates:  Health Maintenance  Topic Date Due   Pap with HPV screening  01/31/2022   Mammogram  12/04/2022   COVID-19 Vaccine (4 - 2024-25 season) 01/31/2023   DTaP/Tdap/Td vaccine (5 - Td or Tdap) 11/03/2023   Medicare Annual Wellness Visit  06/17/2024   Colon Cancer Screening  08/25/2025   Flu Shot  Completed   Hepatitis C Screening  Completed   HIV Screening  Completed   Zoster (Shingles) Vaccine  Completed   HPV Vaccine  Aged Out    Advanced directives: (ACP Link)Information on Advanced Care Planning can be found at [facility] Advance Health Care Directives Advance Health Care Directives ([email or web address]   Next Medicare Annual Wellness Visit scheduled for next year: Yes

## 2023-06-18 NOTE — Telephone Encounter (Signed)
Pt aware to give her new medication till middle of next week to see how it does, and to sit and relax before taking her BP, pt verbalizes understanding.

## 2023-06-18 NOTE — Telephone Encounter (Signed)
Patient seen for AWV and states that she started increased dose of Amlodipine at 10mg .  She has had 2 doses but states that her diastolic bp is still staying in the 100s.  As well as her HR is staying in the 100s.

## 2023-06-18 NOTE — Telephone Encounter (Signed)
Please let her know that she has to give it at least a week on this new dose to see how it is going.  I would also have her sit down for 5 or 6 minutes before checking it and relax or do something that calms her down and then check it.  It does take some time for the new medicine to fully take effect.

## 2023-06-18 NOTE — Progress Notes (Signed)
Subjective:   Destiny [name] is a 58 y.o. female who presents for Medicare Annual (Subsequent) preventive examination.  Visit Complete: Virtual I connected with  Davene Costain on 06/18/23 by a audio enabled telemedicine application and verified that I am speaking with the correct person using two identifiers.  Patient Location: Home  Provider Location: Home Office  This patient declined Interactive audio and video telecommunications. Therefore the visit was completed with audio only.  I discussed the limitations of evaluation and management by telemedicine. The patient expressed understanding and agreed to proceed.  Vital Signs: Because this visit was a virtual/telehealth visit, some criteria may be missing or patient reported. Any vitals not documented were not able to be obtained and vitals that have been documented are patient reported.  Cardiac Risk Factors include: advanced age (>54men, >82 women);hypertension     Objective:    Today's Vitals   06/18/23 1510  Weight: 229 lb (103.9 kg)  Height: 5\' 1"  (1.549 m)   Body mass index is 43.27 kg/m.     06/18/2023    3:18 PM 06/09/2023    6:48 PM 06/09/2023   12:37 PM 02/18/2023    2:39 PM 06/08/2022    8:56 AM 03/16/2022   12:32 PM 02/19/2022    1:15 PM  Advanced Directives  Does Patient Have a Medical Advance Directive? No No No No No No No  Would patient like information on creating a medical advance directive? Yes (MAU/Ambulatory/Procedural Areas - Information given) No - Patient declined No - Patient declined No - Patient declined No - Patient declined  No - Patient declined    Current Medications (verified) Outpatient Encounter Medications as of 06/18/2023  Medication Sig   acetaminophen (TYLENOL) 500 MG tablet Take 1,000 mg by mouth every 8 (eight) hours as needed for moderate pain.   albuterol (VENTOLIN HFA) 108 (90 Base) MCG/ACT inhaler Inhale 1-2 puffs into the lungs every 6 (six) hours as needed for wheezing or shortness of  breath.   amLODipine (NORVASC) 10 MG tablet Take 1 tablet (10 mg total) by mouth daily.   aspirin EC 81 MG tablet Take 1 tablet (81 mg total) by mouth daily. Swallow whole.   atorvastatin (LIPITOR) 40 MG tablet Take 1 tablet (40 mg total) by mouth at bedtime.   azelastine (OPTIVAR) 0.05 % ophthalmic solution Apply 1 drop to eye 2 (two) times daily.   Cholecalciferol (VITAMIN D3 PO) Take 1 tablet by mouth daily.   clopidogrel (PLAVIX) 75 MG tablet Take 1 tablet (75 mg total) by mouth daily.   colestipol (COLESTID) 1 g tablet Take 1 g by mouth daily.   cyclobenzaprine (FLEXERIL) 10 MG tablet TAKE 1 TABLET BY MOUTH AT BEDTIME AS NEEDED FOR MUSCLE SPASMS.   diphenoxylate-atropine (LOMOTIL) 2.5-0.025 MG tablet Take 2 tablets by mouth daily.   fluticasone (FLONASE) 50 MCG/ACT nasal spray PLACE 1 SPRAY INTO BOTH NOSTRILS 2 (TWO) TIMES DAILY   hydrochlorothiazide (HYDRODIURIL) 25 MG tablet Take 1 tablet (25 mg total) by mouth daily.   levothyroxine (SYNTHROID) 112 MCG tablet Take 2 tablets (225 mcg total) by mouth daily before breakfast.   LORazepam (ATIVAN) 0.5 MG tablet TAKE 1 TABLET (0.5 MG) BY MOUTH AT BEDTIME AS NEEDED FOR ANXIETY   mirabegron ER (MYRBETRIQ) 25 MG TB24 tablet Take 1 tablet (25 mg total) by mouth daily.   ondansetron (ZOFRAN) 8 MG tablet Take 1 tablet by mouth every 8 hours as needed for nausea or vomiting.   Turmeric (QC TUMERIC  COMPLEX PO) Take 1 capsule by mouth daily.   vitamin B-12 (CYANOCOBALAMIN) 100 MCG tablet Take 100 mcg by mouth daily.   vitamin C (ASCORBIC ACID) 500 MG tablet Take 500 mg by mouth daily.   zinc gluconate 50 MG tablet Take 50 mg by mouth daily.   dicyclomine (BENTYL) 20 MG tablet Take 20 mg by mouth 3 (three) times daily before meals.   No facility-administered encounter medications on file as of 06/18/2023.    Allergies (verified) Latex   History: Past Medical History:  Diagnosis Date   Chronic back pain    Depression    Hypertension     Hyperthyroidism    Rectal cancer (HCC) 10/29/2015   rectal invasice adenocarcinoma,Adenocarcinoma dx 10/29/15; Stage III, T3, N1, MO   Past Surgical History:  Procedure Laterality Date   ABDOMINAL HYSTERECTOMY     vaginal    BLADDER SURGERY     EYE SURGERY     LAZY EYE CORRECTION   ILEOSTOMY     ILEOSTOMY CLOSURE N/A 09/02/2016   Procedure: ILEOSTOMY REVERSAL;  Surgeon: Romie Levee, MD;  Location: [facility];  Service: General;  Laterality: N/A;   RECTAL EXAM UNDER ANESTHESIA N/A 09/02/2016   Procedure: ANAL EXAM UNDER ANESTHESIA;  Surgeon: Romie Levee, MD;  Location: [facility];  Service: General;  Laterality: N/A;   URETERAL REIMPLANTION Left 02/26/2016   Procedure: left ureteral neocystotomy,double J stent placement, closure vaginal cuff;  Surgeon: Crist Fat, MD;  Location: [facility];  Service: Urology;  Laterality: Left;   VAGINAL HYSTERECTOMY     XI ROBOTIC ASSISTED LOWER ANTERIOR RESECTION N/A 02/26/2016   Procedure: XI ROBOTIC ASSISTED LOWER ANTERIOR RESECTION, diverting LOOP ILEOSTOMY, mobilization splenic flexure, omentopexy;  Surgeon: Romie Levee, MD;  Location: [facility];  Service: General;  Laterality: N/A;   Family History  Problem Relation Age of Onset   Thyroid disease Mother    Arthritis Mother    Lung cancer Father        lung   Migraines Sister    Depression Sister    Migraines Daughter    Cirrhosis Brother    Breast cancer Neg Hx    Social History   Socioeconomic History   Marital status: Divorced    Spouse name: Not on file   Number of children: 3   Years of education: Not on file   Highest education level: Some college, no degree  Occupational History   Occupation: Disabled  Tobacco Use   Smoking status: Never   Smokeless tobacco: Never  Vaping Use   Vaping status: Never Used  Substance and Sexual Activity   Alcohol use: No    Alcohol/week: 0.0 standard drinks of alcohol    Comment: occ   Drug use: No   Sexual activity: Yes    Birth  control/protection: Surgical  Other Topics Concern   Not on file  Social History Narrative   She is on disability.   Grandson lives with her.   Social Drivers of Corporate investment banker Strain: Low Risk  (06/18/2023)   Overall Financial Resource Strain (CARDIA)    Difficulty of Paying Living Expenses: Not hard at all  Food Insecurity: No Food Insecurity (06/18/2023)   Hunger Vital Sign    Worried About Running Out of Food in the Last Year: Never true    Ran Out of Food in the Last Year: Never true  Transportation Needs: No Transportation Needs (06/18/2023)   PRAPARE - Transportation    Lack of  Transportation (Medical): No    Lack of Transportation (Non-Medical): No  Physical Activity: Inactive (06/18/2023)   Exercise Vital Sign    Days of Exercise per Week: 0 days    Minutes of Exercise per Session: 0 min  Stress: No Stress Concern Present (06/18/2023)   [employer] of Occupational Health - Occupational Stress Questionnaire    Feeling of Stress : Only a little  Social Connections: Moderately Integrated (06/18/2023)   Social Connection and Isolation Panel [NHANES]    Frequency of Communication with Friends and Family: More than three times a week    Frequency of Social Gatherings with Friends and Family: Three times a week    Attends Religious Services: More than 4 times per year    Active Member of Clubs or Organizations: Yes    Attends Engineer, structural: More than 4 times per year    Marital Status: Divorced    Tobacco Counseling Counseling given: Not Answered   Clinical Intake:  Pre-visit preparation completed: Yes  Pain : No/denies pain     Diabetes: No  How often do you need to have someone help you when you read instructions, pamphlets, or other written materials from your doctor or pharmacy?: 1 - Never  Interpreter Needed?: No  Information entered by :: Kandis Fantasia LPN   Activities of Daily Living    06/18/2023    3:18 PM 06/09/2023     6:48 PM  In your present state of health, do you have any difficulty performing the following activities:  Hearing? 0 0  Vision? 0 0  Difficulty concentrating or making decisions? 0 0  Walking or climbing stairs? 0   Dressing or bathing? 0   Doing errands, shopping? 0 0  Preparing Food and eating ? N   Using the Toilet? N   In the past six months, have you accidently leaked urine? N   Do you have problems with loss of bowel control? N   Managing your Medications? N   Managing your Finances? N   Housekeeping or managing your Housekeeping? N     Patient Care Team: Dettinger, Elige Radon, MD as PCP - General (Family Medicine) Patwa, Diamond Nickel, MD as Referring Physician (Gastroenterology) Ladene Artist, MD as Consulting Physician (Oncology) Michaelle Copas, MD as Referring Physician (Optometry)  Indicate any recent Medical Services you may have received from other than [facility] providers in the past year (date may be approximate).     Assessment:   This is a routine wellness examination for [facility].  Hearing/Vision screen Hearing Screening - Comments:: Denies hearing difficulties   Vision Screening - Comments:: No vision problems; will schedule routine eye exam soon     Goals Addressed             This Visit's Progress    COMPLETED: AWV       06/03/2020 AWV Goal: Exercise for General Health  Patient will verbalize understanding of the benefits of increased physical activity: Exercising regularly is important. It will improve your overall fitness, flexibility, and endurance. Regular exercise also will improve your overall health. It can help you control your weight, reduce stress, and improve your bone density. Over the next year, patient will increase physical activity as tolerated with a goal of at least 150 minutes of moderate physical activity per week.  You can tell that you are exercising at a moderate intensity if your heart starts beating faster and you start breathing  faster but can still hold  a conversation. Moderate-intensity exercise ideas include: Walking 1 mile (1.6 km) in about 15 minutes Biking Hiking Golfing Dancing Water aerobics Patient will verbalize understanding of everyday activities that increase physical activity by providing examples like the following: Yard work, such as: Insurance underwriter Gardening Washing windows or floors Patient will be able to explain general safety guidelines for exercising:  Before you start a new exercise program, talk with your health care provider. Do not exercise so much that you hurt yourself, feel dizzy, or get very short of breath. Wear comfortable clothes and wear shoes with good support. Drink plenty of water while you exercise to prevent dehydration or heat stroke. Work out until your breathing and your heartbeat get faster.      Prevent falls        Depression Screen    06/18/2023    3:17 PM 06/11/2023   10:36 AM 06/07/2023    2:41 PM 03/05/2023    3:34 PM 12/04/2022    2:33 PM 12/02/2022    2:29 PM 06/08/2022    8:54 AM  PHQ 2/9 Scores  PHQ - 2 Score 4 4 4 4  6  0  PHQ- 9 Score 12 13 13 14  17    Exception Documentation     Patient refusal      Fall Risk    06/18/2023    3:18 PM 06/11/2023   10:36 AM 06/07/2023    2:41 PM 03/05/2023    3:34 PM 12/02/2022    2:29 PM  Fall Risk   Falls in the past year? 0 0 0 1 0  Number falls in past yr: 0   1   Injury with Fall? 0   1   Risk for fall due to : No Fall Risks   Impaired balance/gait   Follow up Falls prevention discussed;Education provided;Falls evaluation completed   Falls evaluation completed     MEDICARE RISK AT HOME: Medicare Risk at Home Any stairs in or around the home?: No If so, are there any without handrails?: No Home free of loose throw rugs in walkways, pet beds, electrical cords, etc?: Yes Adequate lighting in your home to reduce risk of falls?:  Yes Life alert?: No Use of a cane, walker or w/c?: No Grab bars in the bathroom?: Yes Shower chair or bench in shower?: No Elevated toilet seat or a handicapped toilet?: Yes  TIMED UP AND GO:  Was the test performed?  No    Cognitive Function:        06/18/2023    3:18 PM 06/08/2022    8:56 AM 06/03/2020    8:57 AM  6CIT Screen  What Year? 0 points 0 points 0 points  What month? 0 points 0 points 0 points  What time? 0 points 0 points 0 points  Count back from 20 0 points 0 points 0 points  Months in reverse 0 points 0 points 0 points  Repeat phrase 0 points 0 points 0 points  Total Score 0 points 0 points 0 points    Immunizations Immunization History  Administered Date(s) Administered   Hepatitis A 08/08/2007, 10/28/2007, 06/06/2009   Hepatitis A, Adult 08/08/2007, 10/28/2007, 06/06/2009   Hepatitis B 08/08/2007, 10/28/2007, 06/06/2009   Influenza, Seasonal, Injecte, Preservative Fre 03/05/2023   Influenza,inj,Quad PF,6+ Mos 02/19/2016, 05/07/2017, 03/18/2018, 04/07/2019, 03/22/2020   Influenza-Unspecified 03/18/2018   MMR 01/14/2001   Moderna Sars-Covid-2 Vaccination 07/04/2019, 08/01/2019, 04/23/2020  Td 01/14/2001, 11/02/2013   Td (Adult), 2 Lf Tetanus Toxid, Preservative Free 01/14/2001, 11/02/2013   Tdap 11/02/2013   Zoster Recombinant(Shingrix) 01/24/2020, 04/05/2020    TDAP status: Up to date  Flu Vaccine status: Up to date  Pneumococcal vaccine status: Up to date  Covid-19 vaccine status: Information provided on how to obtain vaccines.   Qualifies for Shingles Vaccine? Yes   Zostavax completed No   Shingrix Completed?: Yes  Screening Tests Health Maintenance  Topic Date Due   Cervical Cancer Screening (HPV/Pap Cotest)  01/31/2022   MAMMOGRAM  12/04/2022   COVID-19 Vaccine (4 - 2024-25 season) 01/31/2023   DTaP/Tdap/Td (5 - Td or Tdap) 11/03/2023   Medicare Annual Wellness (AWV)  06/17/2024   Colonoscopy  08/25/2025   INFLUENZA VACCINE   Completed   Hepatitis C Screening  Completed   HIV Screening  Completed   Zoster Vaccines- Shingrix  Completed   HPV VACCINES  Aged Out    Health Maintenance  Health Maintenance Due  Topic Date Due   Cervical Cancer Screening (HPV/Pap Cotest)  01/31/2022   MAMMOGRAM  12/04/2022   COVID-19 Vaccine (4 - 2024-25 season) 01/31/2023    Colorectal cancer screening: Type of screening: Colonoscopy. Completed 08/26/22. Repeat every 3 years  Mammogram status:  Patient declines at this time   Lung Cancer Screening: (Low Dose CT Chest recommended if Age 33-80 years, 20 pack-year currently smoking OR have quit w/in 15years.) does not qualify.   Lung Cancer Screening Referral: n/a  Additional Screening:  Hepatitis C Screening: does qualify; Completed 03/22/20  Vision Screening: Recommended annual ophthalmology exams for early detection of glaucoma and other disorders of the eye. Is the patient up to date with their annual eye exam?  No  Who is the provider or what is the name of the office in which the patient attends annual eye exams? none If pt is not established with a provider, would they like to be referred to a provider to establish care? No .   Dental Screening: Recommended annual dental exams for proper oral hygiene  Community Resource Referral / Chronic Care Management: CRR required this visit?  No   CCM required this visit?  No     Plan:     I have personally reviewed and noted the following in the patient's chart:   Medical and social history Use of alcohol, tobacco or illicit drugs  Current medications and supplements including opioid prescriptions. Patient is not currently taking opioid prescriptions. Functional ability and status Nutritional status Physical activity Advanced directives List of other physicians Hospitalizations, surgeries, and ER visits in previous 12 months Vitals Screenings to include cognitive, depression, and falls Referrals and  appointments  In addition, I have reviewed and discussed with patient certain preventive protocols, quality metrics, and best practice recommendations. A written personalized care plan for preventive services as well as general preventive health recommendations were provided to patient.     Kandis Fantasia [place], [place]   [id number]   After Visit Summary: (MyChart) Due to this being a telephonic visit, the after visit summary with patients personalized plan was offered to patient via MyChart   Nurse Notes: No concerns at this time

## 2023-06-22 ENCOUNTER — Ambulatory Visit: Payer: 59 | Attending: Internal Medicine

## 2023-06-23 NOTE — Patient Instructions (Signed)
Our records indicate that you are due for your screening mammogram.  Please call the imaging center that does your yearly mammograms to make an appointment for a mammogram at your earliest convenience. Our office also has a mobile unit through the [facility] that comes to our location. Please call our office if you would like to make an appointment.   

## 2023-06-24 ENCOUNTER — Ambulatory Visit: Payer: 59 | Admitting: Family Medicine

## 2023-06-24 ENCOUNTER — Encounter: Payer: Self-pay | Admitting: Family Medicine

## 2023-06-24 VITALS — BP 151/77 | HR 82 | Ht 61.0 in | Wt 232.0 lb

## 2023-06-24 DIAGNOSIS — I10 Essential (primary) hypertension: Secondary | ICD-10-CM

## 2023-06-24 DIAGNOSIS — Z8673 Personal history of transient ischemic attack (TIA), and cerebral infarction without residual deficits: Secondary | ICD-10-CM | POA: Diagnosis not present

## 2023-06-24 NOTE — Progress Notes (Signed)
BP (!) 151/77   Pulse 82   Ht 5\' 1"  (1.549 m)   Wt 232 lb (105.2 kg)   LMP  (LMP Unknown) Comment: FULL   SpO2 97%   BMI 43.84 kg/m    Subjective:   Patient ID: Destiny [name], female    DOB: 1966/03/03, 58 y.o.   MRN: [id number]  HPI: Destiny [name] is a 58 y.o. female presenting on 06/24/2023 for Medical Management of Chronic Issues and Hypertension   Hypertension Associated symptoms include headaches. Pertinent negatives include no chest pain, palpitations or shortness of breath.   Coming in for re-check on HYPERTENSION. Currently using Lipitor 40mg , Hydrochlorothiazide 25 mg and Amlodipine 10mg  daily. Blood pressure today is 151/77. She is tracking her BP and heart rate at home and appears SBP is between 120-150's DBP 80-90's and HR 85-100. Has experienced headaches after Amlodipine dosage increase but Tylenol improves symptoms. Reports she feels about the same as before with some fatigue. Asking current heart rate and going to the gym to exercise. Denies lightheadedness, dizziness, or vision changes. Denies weakness, palpation or chest pain.   She has an appointment to see a Neurologist on 07/02/2023 for previous CVA encounter and an MRI ordered. Currently no symptoms bothering her bedsides some mild tingling in her left thumb. Reports left thumb "feels like it's asleep". She was ordered a cardiac heart monitor by Neurology, which she is currently wearing. Denies any trauma to left hand.  Relevant past medical, surgical, family and social history reviewed and updated as indicated. Interim medical history since our last visit reviewed. Allergies and medications reviewed and updated.  Review of Systems  Constitutional:  Positive for fatigue. Negative for chills and fever.  Respiratory:  Negative for cough, chest tightness and shortness of breath.   Cardiovascular:  Negative for chest pain and palpitations.  Skin:  Negative for rash.  Neurological:  Positive for headaches. Negative for  dizziness, weakness and light-headedness.       Positive for tingling in Left Thumb  Psychiatric/Behavioral:  Negative for agitation.   All other systems reviewed and are negative.   Per HPI unless specifically indicated above   Allergies as of 06/24/2023       Reactions   Latex Itching, Rash        Medication List        Accurate as of June 24, 2023 12:58 PM. If you have any questions, ask your nurse or doctor.          acetaminophen 500 MG tablet Commonly known as: TYLENOL Take 1,000 mg by mouth every 8 (eight) hours as needed for moderate pain.   albuterol 108 (90 Base) MCG/ACT inhaler Commonly known as: VENTOLIN HFA Inhale 1-2 puffs into the lungs every 6 (six) hours as needed for wheezing or shortness of breath.   amLODipine 10 MG tablet Commonly known as: NORVASC Take 1 tablet (10 mg total) by mouth daily.   ascorbic acid 500 MG tablet Commonly known as: VITAMIN C Take 500 mg by mouth daily.   aspirin EC 81 MG tablet Take 1 tablet (81 mg total) by mouth daily. Swallow whole.   atorvastatin 40 MG tablet Commonly known as: LIPITOR Take 1 tablet (40 mg total) by mouth at bedtime.   azelastine 0.05 % ophthalmic solution Commonly known as: OPTIVAR Apply 1 drop to eye 2 (two) times daily.   clopidogrel 75 MG tablet Commonly known as: PLAVIX Take 1 tablet (75 mg total) by mouth daily.  colestipol 1 g tablet Commonly known as: COLESTID Take 1 g by mouth daily.   cyclobenzaprine 10 MG tablet Commonly known as: FLEXERIL TAKE 1 TABLET BY MOUTH AT BEDTIME AS NEEDED FOR MUSCLE SPASMS.   dicyclomine 20 MG tablet Commonly known as: BENTYL Take 20 mg by mouth 3 (three) times daily before meals.   diphenoxylate-atropine 2.5-0.025 MG tablet Commonly known as: LOMOTIL Take 2 tablets by mouth daily.   fluticasone 50 MCG/ACT nasal spray Commonly known as: FLONASE PLACE 1 SPRAY INTO BOTH NOSTRILS 2 (TWO) TIMES DAILY   hydrochlorothiazide 25 MG  tablet Commonly known as: HYDRODIURIL Take 1 tablet (25 mg total) by mouth daily.   levothyroxine 112 MCG tablet Commonly known as: SYNTHROID Take 2 tablets (225 mcg total) by mouth daily before breakfast.   LORazepam 0.5 MG tablet Commonly known as: ATIVAN TAKE 1 TABLET (0.5 MG) BY MOUTH AT BEDTIME AS NEEDED FOR ANXIETY   mirabegron ER 25 MG Tb24 tablet Commonly known as: Myrbetriq Take 1 tablet (25 mg total) by mouth daily.   ondansetron 8 MG tablet Commonly known as: ZOFRAN Take 1 tablet by mouth every 8 hours as needed for nausea or vomiting.   QC TUMERIC COMPLEX PO Take 1 capsule by mouth daily.   vitamin B-12 100 MCG tablet Commonly known as: CYANOCOBALAMIN Take 100 mcg by mouth daily.   VITAMIN D3 PO Take 1 tablet by mouth daily.   zinc gluconate 50 MG tablet Take 50 mg by mouth daily.         Objective:   BP (!) 151/77   Pulse 82   Ht 5\' 1"  (1.549 m)   Wt 232 lb (105.2 kg)   LMP  (LMP Unknown) Comment: FULL   SpO2 97%   BMI 43.84 kg/m   Wt Readings from Last 3 Encounters:  06/24/23 232 lb (105.2 kg)  06/18/23 229 lb (103.9 kg)  06/11/23 229 lb (103.9 kg)    Physical Exam Constitutional:      General: She is not in acute distress.    Appearance: Normal appearance. She is obese. She is not diaphoretic.  Cardiovascular:     Rate and Rhythm: Normal rate and regular rhythm.     Pulses: Normal pulses.     Heart sounds: Normal heart sounds. No murmur heard. Pulmonary:     Effort: Pulmonary effort is normal. No respiratory distress.     Breath sounds: Normal breath sounds. No wheezing.  Skin:    General: Skin is warm and dry.  Neurological:     Mental Status: She is alert and oriented to person, place, and time.     Comments: Left thumb is negative for sensory and motor deficit.  Psychiatric:        Behavior: Behavior normal.       Assessment & Plan:   Problem List Items Addressed This Visit       Cardiovascular and Mediastinum    Essential hypertension, benign - Primary   Other Visit Diagnoses       History of CVA (cerebrovascular accident)           Hypertension is being well controlled on Lipitor, hydrochlorothiazide, and current dose of amlodipine. Blood pressure is 151/77 and her home recordings have been good. Discussed exercising and that her heart rate is in normal range, just go slow at first and rest as needed. Encouraged hydration. For headaches she has been using tylenol and that appears to give her relief, continue use as needed. She is  experiencing no deficits from CVA besides her thumb feeling numb which is probably residual from CVA. She returns to Neurologist on 07/02/2023. Follow up plan: Return in about 2 months (around 08/22/2023), or if symptoms worsen or fail to improve, for Hypertension recheck.  Counseling provided for all of the vaccine components No orders of the defined types were placed in this encounter.   Maximino Sarin PA-S [facility] 06/24/2023, 12:58 PM  I was personally present for all components of the history, physical exam and/or medical decision making.  I agree with the documentation performed by the student and agree with assessment and plan above.  Patient's blood pressure looks a lot better and I think that a good range for her right now especially status post CVA.  She is going to continue to monitor at home and continue current medicine.Arville Care, MD [facility] 06/24/2023, 12:58 PM

## 2023-06-26 ENCOUNTER — Other Ambulatory Visit: Payer: Self-pay | Admitting: Family Medicine

## 2023-07-03 ENCOUNTER — Other Ambulatory Visit: Payer: Self-pay | Admitting: Family Medicine

## 2023-07-03 DIAGNOSIS — F331 Major depressive disorder, recurrent, moderate: Secondary | ICD-10-CM

## 2023-07-21 ENCOUNTER — Other Ambulatory Visit: Payer: Self-pay | Admitting: Family Medicine

## 2023-07-22 ENCOUNTER — Other Ambulatory Visit: Payer: Self-pay | Admitting: Family Medicine

## 2023-07-22 DIAGNOSIS — J069 Acute upper respiratory infection, unspecified: Secondary | ICD-10-CM

## 2023-07-22 NOTE — Telephone Encounter (Signed)
This medicine can be very hard on the liver so typically we would not continue it for long-term courses like this or send a refill for it unless the problem is still there, if she still having the problem then please have her be seen sooner so we can evaluate because this is also a very potent medicine so it should be healed up if she was still having issues.

## 2023-07-22 NOTE — Telephone Encounter (Signed)
Last OV 06/24/23. Last RF another provider. Next OV 08/25/23

## 2023-08-04 ENCOUNTER — Encounter: Payer: Self-pay | Admitting: Family Medicine

## 2023-08-04 ENCOUNTER — Ambulatory Visit (INDEPENDENT_AMBULATORY_CARE_PROVIDER_SITE_OTHER): Admitting: Family Medicine

## 2023-08-04 VITALS — BP 132/75 | HR 89 | Temp 98.2°F | Ht 61.0 in | Wt 233.0 lb

## 2023-08-04 DIAGNOSIS — H00012 Hordeolum externum right lower eyelid: Secondary | ICD-10-CM | POA: Diagnosis not present

## 2023-08-04 DIAGNOSIS — H1031 Unspecified acute conjunctivitis, right eye: Secondary | ICD-10-CM | POA: Diagnosis not present

## 2023-08-04 DIAGNOSIS — M545 Low back pain, unspecified: Secondary | ICD-10-CM | POA: Diagnosis not present

## 2023-08-04 MED ORDER — METHYLPREDNISOLONE ACETATE 80 MG/ML IJ SUSP
80.0000 mg | Freq: Once | INTRAMUSCULAR | Status: AC
  Administered 2023-08-04: 80 mg via INTRAMUSCULAR

## 2023-08-04 MED ORDER — POLYMYXIN B-TRIMETHOPRIM 10000-0.1 UNIT/ML-% OP SOLN: 1.0000 [drp] | OPHTHALMIC | 0 refills | Status: AC

## 2023-08-04 NOTE — Progress Notes (Signed)
 Acute Office Visit  Subjective:     Patient ID: Destiny [name], female    DOB: Jan 30, 1966, 58 y.o.   MRN: [id number]  Chief Complaint  Patient presents with   Back Pain   Eye Problem    Eye Problem  The right eye is affected. This is a new problem. Episode onset: 2 days. The problem has been gradually worsening. There was no injury mechanism. The patient is experiencing no pain. There is No known exposure to pink eye. She Does not wear contacts. Associated symptoms include eye redness (lower lid) and photophobia. Pertinent negatives include no blurred vision, eye discharge, double vision, fever, foreign body sensation, itching, nausea, recent URI or vomiting. She has tried commercial eye wash for the symptoms. The treatment provided moderate relief.   Chronic bilateral lower back pain. Increased pain lately, not new symptoms. Achy pain. Pain does not radiate. No numbness/tingling, saddle anesthesia, changes in bowel or bladder control. She has gotten steroid IM injections in the past with some relief and would like one today. Last done in 5-6 months ago.   Review of Systems  Constitutional:  Negative for fever.  Eyes:  Positive for photophobia and redness (lower lid). Negative for blurred vision, double vision, discharge and itching.  Gastrointestinal:  Negative for nausea and vomiting.        Objective:    BP 132/75   Pulse 89   Temp 98.2 F (36.8 C) (Temporal)   Ht 5\' 1"  (1.549 m)   Wt 233 lb (105.7 kg)   LMP  (LMP Unknown) Comment: FULL   SpO2 98%   BMI 44.02 kg/m    Physical Exam Vitals and nursing note reviewed.  Constitutional:      General: She is not in acute distress.    Appearance: She is not ill-appearing, toxic-appearing or diaphoretic.  Eyes:     General: Lids are everted, no foreign bodies appreciated. Vision grossly intact. Gaze aligned appropriately.        Right eye: Hordeolum (lower outer lid) present. No foreign body.     Extraocular Movements:  Extraocular movements intact.     Conjunctiva/sclera:     Right eye: Right conjunctiva is injected. Exudate (dried yellow crusting) present.     Left eye: Left conjunctiva is not injected. No exudate. Pulmonary:     Effort: Pulmonary effort is normal. No respiratory distress.  Musculoskeletal:     Right lower leg: No edema.     Left lower leg: No edema.  Skin:    General: Skin is warm and dry.  Neurological:     General: No focal deficit present.     Mental Status: She is alert and oriented to person, place, and time.     Motor: No weakness.     Gait: Gait normal.  Psychiatric:        Mood and Affect: Mood normal.        Behavior: Behavior normal.     No results found for any visits on 08/04/23.      Assessment & Plan:   Destiny "MaryJane" was seen today for back pain and eye problem.  Diagnoses and all orders for this visit:  Hordeolum externum of right lower eyelid Discussed warm compresses.   Acute bacterial conjunctivitis of right eye Polytrim as below. Discussed prevention of spread.  -     trimethoprim-polymyxin b (POLYTRIM) ophthalmic solution; Place 1 drop into the right eye every 4 (four) hours for 7 days.  Chronic bilateral low  back pain without sciatica No red flags symptoms. Steroid IM injection today for increased pain.  -     methylPREDNISolone acetate (DEPO-MEDROL) injection 80 mg  Return if symptoms worsen or fail to improve.  The patient indicates understanding of these issues and agrees with the plan.  Gabriel Earing, FNP

## 2023-08-13 ENCOUNTER — Other Ambulatory Visit: Payer: Self-pay | Admitting: Family Medicine

## 2023-08-13 MED ORDER — ATORVASTATIN CALCIUM 40 MG PO TABS: 40.0000 mg | ORAL_TABLET | Freq: Every day | ORAL | 1 refills | Status: DC

## 2023-08-13 NOTE — Telephone Encounter (Signed)
  Prescription Request  08/13/2023  Is this a "Controlled Substance" medicine? no  Have you seen your PCP in the last 2 weeks? 06/24/23  If YES, route message to pool  -  If NO, patient needs to be scheduled for appointment.  What is the name of the medication or equipment? atorvastatin  Have you contacted your pharmacy to request a refill? no   Which pharmacy would you like this sent to? [facility]   Patient notified that their request is being sent to the clinical staff for review and that they should receive a response within 2 business days.

## 2023-08-13 NOTE — Telephone Encounter (Signed)
 Atorvastatin was sent in by ER doc-ok to refill?

## 2023-08-22 ENCOUNTER — Other Ambulatory Visit: Payer: Self-pay | Admitting: Family Medicine

## 2023-08-25 ENCOUNTER — Encounter: Payer: Self-pay | Admitting: Family Medicine

## 2023-08-25 ENCOUNTER — Ambulatory Visit (INDEPENDENT_AMBULATORY_CARE_PROVIDER_SITE_OTHER): Payer: 59 | Admitting: Family Medicine

## 2023-08-25 VITALS — BP 138/82 | HR 85 | Ht 61.0 in | Wt 233.0 lb

## 2023-08-25 DIAGNOSIS — I10 Essential (primary) hypertension: Secondary | ICD-10-CM | POA: Diagnosis not present

## 2023-08-25 DIAGNOSIS — F331 Major depressive disorder, recurrent, moderate: Secondary | ICD-10-CM

## 2023-08-25 DIAGNOSIS — E890 Postprocedural hypothyroidism: Secondary | ICD-10-CM | POA: Diagnosis not present

## 2023-08-25 MED ORDER — DULOXETINE HCL 60 MG PO CPEP
120.0000 mg | ORAL_CAPSULE | Freq: Every day | ORAL | 3 refills | Status: DC
Start: 2023-08-25 — End: 2023-09-21

## 2023-08-25 NOTE — Progress Notes (Signed)
 BP 138/82   Pulse 85   Ht 5\' 1"  (1.549 m)   Wt 233 lb (105.7 kg)   LMP  (LMP Unknown) Comment: FULL   SpO2 95%   BMI 44.02 kg/m    Subjective:   Patient ID: Davene Costain, female    DOB: December 17, 1965, 58 y.o.   MRN: [id number]  HPI: DONITA NEWLAND is a 58 y.o. female presenting on 08/25/2023 for Medical Management of Chronic Issues, Hypertension, and Depression   HPI Hypertension Patient is currently on amlodipine and hydrochlorothiazide, and their blood pressure today is 138/82. Patient denies any lightheadedness or dizziness. Patient denies headaches, blurred vision, chest pains, shortness of breath, or weakness. Denies any side effects from medication and is content with current medication.   Hypothyroidism recheck Patient is coming in for thyroid recheck today as well. They deny any issues with hair changes or heat or cold problems or diarrhea or constipation. They deny any chest pain or palpitations. They are currently on levothyroxine 112 micrograms   Depression recheck Patient is coming in for depression recheck today.  She currently uses some lorazepam as needed but very infrequently.  Her last refill was 6 months ago.  Patient has been taking duloxetine 2 tablets of the 30 mg for a total of 60 mg daily and she feels like it is better than her Zoloft was does help a little bit with pain but is not completely where she like to be.  She says she still feels like she has been crying a lot all the time even at small stuff.  She denies any suicidal ideations or thoughts of himself.    08/25/2023    1:11 PM 06/24/2023   10:29 AM 06/18/2023    3:17 PM 06/11/2023   10:36 AM 06/07/2023    2:41 PM  Depression screen PHQ 2/9  Decreased Interest 2 2 2 2 2   Down, Depressed, Hopeless 2 2 2 2 2   PHQ - 2 Score 4 4 4 4 4   Altered sleeping 2 2 2 2 2   Tired, decreased energy 2 2 2 2 2   Change in appetite 1 1 1 2 2   Feeling bad or failure about yourself  1 1 1 1 1   Trouble concentrating 2 2 2 2 2    Moving slowly or fidgety/restless 0 0 0 0 0  Suicidal thoughts 0 0 0 0 0  PHQ-9 Score 12 12 12 13 13   Difficult doing work/chores Somewhat difficult Not difficult at all Not difficult at all Not difficult at all Not difficult at all     Relevant past medical, surgical, family and social history reviewed and updated as indicated. Interim medical history since our last visit reviewed. Allergies and medications reviewed and updated.  Review of Systems  Constitutional:  Negative for chills and fever.  Eyes:  Negative for visual disturbance.  Respiratory:  Negative for chest tightness and shortness of breath.   Cardiovascular:  Positive for leg swelling. Negative for chest pain.  Genitourinary:  Negative for difficulty urinating and dysuria.  Musculoskeletal:  Positive for arthralgias and back pain. Negative for gait problem.  Skin:  Negative for rash.  Neurological:  Negative for dizziness, light-headedness and headaches.  Psychiatric/Behavioral:  Positive for dysphoric mood. Negative for agitation, behavioral problems, self-injury, sleep disturbance and suicidal ideas. The patient is nervous/anxious.   All other systems reviewed and are negative.   Per HPI unless specifically indicated above   Allergies as of 08/25/2023  Reactions   Latex Itching, Rash        Medication List        Accurate as of August 25, 2023  1:27 PM. If you have any questions, ask your nurse or doctor.          acetaminophen 500 MG tablet Commonly known as: TYLENOL Take 1,000 mg by mouth every 8 (eight) hours as needed for moderate pain.   albuterol 108 (90 Base) MCG/ACT inhaler Commonly known as: VENTOLIN HFA Inhale 1-2 puffs into the lungs every 6 (six) hours as needed for wheezing or shortness of breath.   amLODipine 10 MG tablet Commonly known as: NORVASC Take 1 tablet (10 mg total) by mouth daily.   ascorbic acid 500 MG tablet Commonly known as: VITAMIN C Take 500 mg by mouth  daily.   aspirin EC 81 MG tablet Take 1 tablet (81 mg total) by mouth daily. Swallow whole.   atorvastatin 40 MG tablet Commonly known as: LIPITOR Take 1 tablet (40 mg total) by mouth at bedtime.   azelastine 0.05 % ophthalmic solution Commonly known as: OPTIVAR Apply 1 drop to eye 2 (two) times daily.   clopidogrel 75 MG tablet Commonly known as: PLAVIX Take 1 tablet (75 mg total) by mouth daily.   colestipol 1 g tablet Commonly known as: COLESTID Take 1 g by mouth daily.   cyclobenzaprine 10 MG tablet Commonly known as: FLEXERIL TAKE 1 TABLET BY MOUTH AT BEDTIME AS NEEDED FOR MUSCLE SPASMS.   dicyclomine 20 MG tablet Commonly known as: BENTYL Take 20 mg by mouth 3 (three) times daily before meals.   diphenoxylate-atropine 2.5-0.025 MG tablet Commonly known as: LOMOTIL Take 2 tablets by mouth daily.   DULoxetine 60 MG capsule Commonly known as: Cymbalta Take 2 capsules (120 mg total) by mouth daily. Started by: Elige Radon Jakeria Caissie   fluticasone 50 MCG/ACT nasal spray Commonly known as: FLONASE PLACE 1 SPRAY INTO BOTH NOSTRILS 2 (TWO) TIMES DAILY   gabapentin 300 MG capsule Commonly known as: NEURONTIN Take 300 mg by mouth 3 (three) times daily.   hydrochlorothiazide 25 MG tablet Commonly known as: HYDRODIURIL Take 1 tablet (25 mg total) by mouth daily.   levothyroxine 112 MCG tablet Commonly known as: SYNTHROID Take 2 tablets (225 mcg total) by mouth daily before breakfast.   LORazepam 0.5 MG tablet Commonly known as: ATIVAN TAKE 1 TABLET (0.5 MG) BY MOUTH AT BEDTIME AS NEEDED FOR ANXIETY   mirabegron ER 25 MG Tb24 tablet Commonly known as: Myrbetriq Take 1 tablet (25 mg total) by mouth daily.   ondansetron 8 MG tablet Commonly known as: ZOFRAN Take 1 tablet by mouth every 8 hours as needed for nausea or vomiting.   QC TUMERIC COMPLEX PO Take 1 capsule by mouth daily.   vitamin B-12 100 MCG tablet Commonly known as: CYANOCOBALAMIN Take 100 mcg by  mouth daily.   VITAMIN D3 PO Take 1 tablet by mouth daily.   zinc gluconate 50 MG tablet Take 50 mg by mouth daily.         Objective:   BP 138/82   Pulse 85   Ht 5\' 1"  (1.549 m)   Wt 233 lb (105.7 kg)   LMP  (LMP Unknown) Comment: FULL   SpO2 95%   BMI 44.02 kg/m   Wt Readings from Last 3 Encounters:  08/25/23 233 lb (105.7 kg)  08/04/23 233 lb (105.7 kg)  06/24/23 232 lb (105.2 kg)    Physical Exam Vitals and  nursing note reviewed.  Constitutional:      General: She is not in acute distress.    Appearance: She is well-developed. She is not diaphoretic.  Eyes:     Conjunctiva/sclera: Conjunctivae normal.  Cardiovascular:     Rate and Rhythm: Normal rate and regular rhythm.     Heart sounds: Normal heart sounds. No murmur heard. Pulmonary:     Effort: Pulmonary effort is normal. No respiratory distress.     Breath sounds: Normal breath sounds. No wheezing.  Musculoskeletal:        General: Swelling (1+ peripheral edema bilateral lower extremities) present. Normal range of motion.  Skin:    General: Skin is warm and dry.     Findings: No rash.  Neurological:     Mental Status: She is alert and oriented to person, place, and time.     Coordination: Coordination normal.  Psychiatric:        Mood and Affect: Mood is anxious and depressed.        Behavior: Behavior normal.        Thought Content: Thought content does not include suicidal ideation. Thought content does not include suicidal plan.       Assessment & Plan:   Problem List Items Addressed This Visit       Cardiovascular and Mediastinum   Essential hypertension, benign   Relevant Orders   CMP14+EGFR     Endocrine   Hypothyroidism   Relevant Orders   TSH     Other   Depression - Primary   Relevant Medications   DULoxetine (CYMBALTA) 60 MG capsule    Will increase duloxetine to 120 mg daily to see if that helps more with the pain and depression.  Will do blood work today.  Blood  pressure looks good today. Follow up plan: Return if symptoms worsen or fail to improve, for 1 to 2 months follow-up with anxiety depression.  Counseling provided for all of the vaccine components Orders Placed This Encounter  Procedures   TSH   CMP14+EGFR    Arville Care, MD [facility] 08/25/2023, 1:27 PM

## 2023-08-26 ENCOUNTER — Other Ambulatory Visit: Payer: Self-pay | Admitting: *Deleted

## 2023-08-26 ENCOUNTER — Encounter: Payer: Self-pay | Admitting: Family Medicine

## 2023-08-26 LAB — TSH: TSH: 0.043 u[IU]/mL — ABNORMAL LOW (ref 0.450–4.500)

## 2023-08-26 LAB — CMP14+EGFR
ALT: 17 IU/L (ref 0–32)
AST: 19 IU/L (ref 0–40)
Albumin: 4.4 g/dL (ref 3.8–4.9)
Alkaline Phosphatase: 90 IU/L (ref 44–121)
BUN/Creatinine Ratio: 21 (ref 9–23)
BUN: 19 mg/dL (ref 6–24)
Bilirubin Total: 0.4 mg/dL (ref 0.0–1.2)
CO2: 24 mmol/L (ref 20–29)
Calcium: 9.5 mg/dL (ref 8.7–10.2)
Chloride: 98 mmol/L (ref 96–106)
Creatinine, Ser: 0.92 mg/dL (ref 0.57–1.00)
Globulin, Total: 2.9 g/dL (ref 1.5–4.5)
Glucose: 97 mg/dL (ref 70–99)
Potassium: 4.2 mmol/L (ref 3.5–5.2)
Sodium: 138 mmol/L (ref 134–144)
Total Protein: 7.3 g/dL (ref 6.0–8.5)
eGFR: 73 mL/min/{1.73_m2} (ref >59)

## 2023-08-26 MED ORDER — LEVOTHYROXINE SODIUM 100 MCG PO TABS: 100.0000 ug | ORAL_TABLET | Freq: Every day | ORAL | 1 refills | Status: DC

## 2023-08-30 ENCOUNTER — Ambulatory Visit (INDEPENDENT_AMBULATORY_CARE_PROVIDER_SITE_OTHER): Payer: 59 | Admitting: Neurology

## 2023-08-30 ENCOUNTER — Encounter: Payer: Self-pay | Admitting: Neurology

## 2023-08-30 VITALS — BP 159/82 | HR 69 | Ht 61.0 in | Wt 234.0 lb

## 2023-08-30 DIAGNOSIS — E669 Obesity, unspecified: Secondary | ICD-10-CM

## 2023-08-30 DIAGNOSIS — Z9189 Other specified personal risk factors, not elsewhere classified: Secondary | ICD-10-CM | POA: Diagnosis not present

## 2023-08-30 DIAGNOSIS — R7303 Prediabetes: Secondary | ICD-10-CM

## 2023-08-30 DIAGNOSIS — I639 Cerebral infarction, unspecified: Secondary | ICD-10-CM

## 2023-08-30 DIAGNOSIS — I679 Cerebrovascular disease, unspecified: Secondary | ICD-10-CM

## 2023-08-30 DIAGNOSIS — E782 Mixed hyperlipidemia: Secondary | ICD-10-CM | POA: Diagnosis not present

## 2023-08-30 NOTE — Patient Instructions (Signed)
 I had a long d/w patient and her friend about his recent pontine lacunar stroke, risk for recurrent stroke/TIAs, personally independently reviewed imaging studies and stroke evaluation results and answered questions.Continue aspirin 81 mg daily  for secondary stroke prevention and maintain strict control of hypertension with blood pressure goal below 130/90, diabetes with hemoglobin A1c goal below 6.5% and lipids with LDL cholesterol goal below 70 mg/dL. I also advised the patient to eat a healthy diet with plenty of whole grains, cereals, fruits and vegetables, exercise regularly and maintain ideal body weight .check follow-up lipid profile, hemoglobin A1c and defer to sleep MD for polysomnogram for obstructive sleep apnea.  Followup in the future with  my nurse practitioner in 6 months or call earlier if necessary.  Stroke Prevention Some medical conditions and behaviors can lead to a higher chance of having a stroke. You can help prevent a stroke by eating healthy, exercising, not smoking, and managing any medical conditions you have. Stroke is a leading cause of functional impairment. Primary prevention is particularly important because a majority of strokes are first-time events. Stroke changes the lives of not only those who experience a stroke but also their family and other caregivers. How can this condition affect me? A stroke is a medical emergency and should be treated right away. A stroke can lead to brain damage and can sometimes be life-threatening. If a person gets medical treatment right away, there is a better chance of surviving and recovering from a stroke. What can increase my risk? The following medical conditions may increase your risk of a stroke: Cardiovascular disease. High blood pressure (hypertension). Diabetes. High cholesterol. Sickle cell disease. Blood clotting disorders (hypercoagulable state). Obesity. Sleep disorders (obstructive sleep apnea). Other risk factors  include: Being older than age 52. Having a history of blood clots, stroke, or mini-stroke (transient ischemic attack, TIA). Genetic factors, such as race, ethnicity, or a family history of stroke. Smoking cigarettes or using other tobacco products. Taking birth control pills, especially if you also use tobacco. Heavy use of alcohol or drugs, especially cocaine and methamphetamine. Physical inactivity. What actions can I take to prevent this? Manage your health conditions High cholesterol levels. Eating a healthy diet is important for preventing high cholesterol. If cholesterol cannot be managed through diet alone, you may need to take medicines. Take any prescribed medicines to control your cholesterol as told by your health care provider. Hypertension. To reduce your risk of stroke, try to keep your blood pressure below 130/80. Eating a healthy diet and exercising regularly are important for controlling blood pressure. If these steps are not enough to manage your blood pressure, you may need to take medicines. Take any prescribed medicines to control hypertension as told by your health care provider. Ask your health care provider if you should monitor your blood pressure at home. Have your blood pressure checked every year, even if your blood pressure is normal. Blood pressure increases with age and some medical conditions. Diabetes. Eating a healthy diet and exercising regularly are important parts of managing your blood sugar (glucose). If your blood sugar cannot be managed through diet and exercise, you may need to take medicines. Take any prescribed medicines to control your diabetes as told by your health care provider. Get evaluated for obstructive sleep apnea. Talk to your health care provider about getting a sleep evaluation if you snore a lot or have excessive sleepiness. Make sure that any other medical conditions you have, such as atrial fibrillation or atherosclerosis, are  managed. Nutrition Follow instructions from your health care provider about what to eat or drink to help manage your health condition. These instructions may include: Reducing your daily calorie intake. Limiting how much salt (sodium) you use to 1,500 milligrams (mg) each day. Using only healthy fats for cooking, such as olive oil, canola oil, or sunflower oil. Eating healthy foods. You can do this by: Choosing foods that are high in fiber, such as whole grains, and fresh fruits and vegetables. Eating at least 5 servings of fruits and vegetables a day. Try to fill one-half of your plate with fruits and vegetables at each meal. Choosing lean protein foods, such as lean cuts of meat, poultry without skin, fish, tofu, beans, and nuts. Eating low-fat dairy products. Avoiding foods that are high in sodium. This can help lower blood pressure. Avoiding foods that have saturated fat, trans fat, and cholesterol. This can help prevent high cholesterol. Avoiding processed and prepared foods. Counting your daily carbohydrate intake.  Lifestyle If you drink alcohol: Limit how much you have to: 0-1 drink a day for women who are not pregnant. 0-2 drinks a day for men. Know how much alcohol is in your drink. In the [place] one drink equals one 12 oz bottle of beer ( ), one 5 oz glass of wine ( ), or one 1 oz glass of hard liquor (44mL). Do not use any products that contain nicotine or tobacco. These products include cigarettes, chewing tobacco, and vaping devices, such as e-cigarettes. If you need help quitting, ask your health care provider. Avoid secondhand smoke. Do not use drugs. Activity  Try to stay at a healthy weight. Get at least 30 minutes of exercise on most days, such as: Fast walking. Biking. Swimming. Medicines Take over-the-counter and prescription medicines only as told by your health care provider. Aspirin or blood thinners (antiplatelets or anticoagulants) may be recommended  to reduce your risk of forming blood clots that can lead to stroke. Avoid taking birth control pills. Talk to your health care provider about the risks of taking birth control pills if: You are over 58 years old. You smoke. You get very bad headaches. You have had a blood clot. Where to find more information American Stroke Association: www.strokeassociation.org Get help right away if: You or a loved one has any symptoms of a stroke. "BE FAST" is an easy way to remember the main warning signs of a stroke: B - Balance. Signs are dizziness, sudden trouble walking, or loss of balance. E - Eyes. Signs are trouble seeing or a sudden change in vision. F - Face. Signs are sudden weakness or numbness of the face, or the face or eyelid drooping on one side. A - Arms. Signs are weakness or numbness in an arm. This happens suddenly and usually on one side of the body. S - Speech. Signs are sudden trouble speaking, slurred speech, or trouble understanding what people say. T - Time. Time to call emergency services. Write down what time symptoms started. You or a loved one has other signs of a stroke, such as: A sudden, severe headache with no known cause. Nausea or vomiting. Seizure. These symptoms may represent a serious problem that is an emergency. Do not wait to see if the symptoms will go away. Get medical help right away. Call your local emergency services (911 in the [place] Do not drive yourself to the hospital. Summary You can help to prevent a stroke by eating healthy, exercising, not smoking, limiting alcohol intake, and managing  any medical conditions you may have. Do not use any products that contain nicotine or tobacco. These include cigarettes, chewing tobacco, and vaping devices, such as e-cigarettes. If you need help quitting, ask your health care provider. Remember "BE FAST" for warning signs of a stroke. Get help right away if you or a loved one has any of these signs. This information  is not intended to replace advice given to you by your health care provider. Make sure you discuss any questions you have with your health care provider. Document Revised: 04/20/2022 Document Reviewed: 04/20/2022 Elsevier Patient Education  2024 [employer].

## 2023-08-30 NOTE — Progress Notes (Signed)
 [facility] [address] [place]. [place] [address] [phone]       OFFICE CONSULT NOTE  [name] Date of Birth:  02-14-1966 Medical Record Number:  [id number]   Referring MD: Lindie Spruce  Reason for Referral: Stroke  HPI: Destiny [name] is a 58 year old pleasant Caucasian lady seen today for office consultation visit for stroke.  History is obtained from the patient, her friend Cordelia Pen who is accompanying her and review of electronic medical records.  I personally reviewed pertinent available imaging films in PACS. She has past medical history of hypertension, hyperthyroidism status post radioactive iodine treatment in 2017 and now hypothyroidism, rectal cancer stage III status post surgery and chemo and COVID infection.  She presented to the ED on 06/09/2023 with a 10-day history of headache with imbalance and elevated blood pressure.  CT head showed no acute abnormality and blood pressure was elevated at 190/110 in the ED.  She complained of left arm numbness and also left hand grip weakness and left leg drift and some slurred speech.  Code stroke was activated.  CT head and CT angiogram showed no significant acute abnormality or large vessel stenosis or occlusion.  MRI scan showed a left pontine acute infarct along with subtle diffusion abnormalities in bilateral middle cerebellar peduncles probably from small vessel disease as well.  Echocardiogram showed ejection fraction of 60 to 65% with moderate concentric hypertrophy.  LDL cholesterol was 70 mg percent and hemoglobin A1c 6.3.  Patient subsequently had 30-day outpatient heart monitor which was negative for paroxysmal A-fib.  She was discharged on aspirin Plavix for 3 weeks followed by aspirin alone.  She states she is doing well her gait and balance have improved.  She has slight numbness in the left hands and some diminished fine motor skills but it is improving.  She has some good days and bad days and balance may  not be the great.  She is tolerating aspirin well with minor bruising and no bleeding.  She is tolerating Lipitor well without muscle aches and pains.  She started eating healthy and has lost some weight.  She has never been tested for sleep apnea.  She has had no recurrent TIA or stroke symptoms. ROS:   14 system review of systems is positive for gait instability, dizziness, bruising, numbness all other systems negative  PMH:  Past Medical History:  Diagnosis Date   Chronic back pain    Depression    Hypertension    Hyperthyroidism    Rectal cancer (HCC) 10/29/2015   rectal invasice adenocarcinoma,Adenocarcinoma dx 10/29/15; Stage III, T3, N1, MO    Social History:  Social History   Socioeconomic History   Marital status: Divorced    Spouse name: Not on file   Number of children: 3   Years of education: Not on file   Highest education level: Some college, no degree  Occupational History   Occupation: Disabled  Tobacco Use   Smoking status: Never   Smokeless tobacco: Never  Vaping Use   Vaping status: Never Used  Substance and Sexual Activity   Alcohol use: No    Alcohol/week: 0.0 standard drinks of alcohol    Comment: occ   Drug use: No   Sexual activity: Yes    Birth control/protection: Surgical  Other Topics Concern   Not on file  Social History Narrative   She is on disability.   Grandson lives with her.   Social Drivers of Corporate investment banker  Strain: Low Risk  (06/18/2023)   Overall Financial Resource Strain (CARDIA)    Difficulty of Paying Living Expenses: Not hard at all  Food Insecurity: No Food Insecurity (06/18/2023)   Hunger Vital Sign    Worried About Running Out of Food in the Last Year: Never true    Ran Out of Food in the Last Year: Never true  Transportation Needs: No Transportation Needs (06/18/2023)   PRAPARE - Administrator, Civil Service (Medical): No    Lack of Transportation (Non-Medical): No  Physical Activity: Inactive  (06/18/2023)   Exercise Vital Sign    Days of Exercise per Week: 0 days    Minutes of Exercise per Session: 0 min  Stress: No Stress Concern Present (06/18/2023)   [employer] of Occupational Health - Occupational Stress Questionnaire    Feeling of Stress : Only a little  Social Connections: Moderately Integrated (06/18/2023)   Social Connection and Isolation Panel [NHANES]    Frequency of Communication with Friends and Family: More than three times a week    Frequency of Social Gatherings with Friends and Family: Three times a week    Attends Religious Services: More than 4 times per year    Active Member of Clubs or Organizations: Yes    Attends Banker Meetings: More than 4 times per year    Marital Status: Divorced  Intimate Partner Violence: Not At Risk (06/18/2023)   Humiliation, Afraid, Rape, and Kick questionnaire    Fear of Current or Ex-Partner: No    Emotionally Abused: No    Physically Abused: No    Sexually Abused: No    Medications:   Current Outpatient Medications on File Prior to Visit  Medication Sig Dispense Refill   acetaminophen (TYLENOL) 500 MG tablet Take 1,000 mg by mouth every 8 (eight) hours as needed for moderate pain.     albuterol (VENTOLIN HFA) 108 (90 Base) MCG/ACT inhaler Inhale 1-2 puffs into the lungs every 6 (six) hours as needed for wheezing or shortness of breath. 1 each 0   amLODipine (NORVASC) 10 MG tablet Take 1 tablet (10 mg total) by mouth daily. 90 tablet 1   aspirin EC 81 MG tablet Take 1 tablet (81 mg total) by mouth daily. Swallow whole.     atorvastatin (LIPITOR) 40 MG tablet Take 1 tablet (40 mg total) by mouth at bedtime. 90 tablet 1   azelastine (OPTIVAR) 0.05 % ophthalmic solution Apply 1 drop to eye 2 (two) times daily.     Cholecalciferol (VITAMIN D3 PO) Take 1 tablet by mouth daily.     colestipol (COLESTID) 1 g tablet Take 1 g by mouth daily.     cyclobenzaprine (FLEXERIL) 10 MG tablet TAKE 1 TABLET BY MOUTH AT  BEDTIME AS NEEDED FOR MUSCLE SPASMS. 30 tablet 1   diphenoxylate-atropine (LOMOTIL) 2.5-0.025 MG tablet Take 2 tablets by mouth daily.     DULoxetine (CYMBALTA) 60 MG capsule Take 2 capsules (120 mg total) by mouth daily. 60 capsule 3   fluticasone (FLONASE) 50 MCG/ACT nasal spray PLACE 1 SPRAY INTO BOTH NOSTRILS 2 (TWO) TIMES DAILY 48 mL 1   gabapentin (NEURONTIN) 300 MG capsule Take 300 mg by mouth 3 (three) times daily.     hydrochlorothiazide (HYDRODIURIL) 25 MG tablet Take 1 tablet (25 mg total) by mouth daily. 90 tablet 3   levothyroxine (SYNTHROID) 100 MCG tablet Take 1 tablet (100 mcg total) by mouth daily. 90 tablet 1   LORazepam (ATIVAN) 0.5  MG tablet TAKE 1 TABLET (0.5 MG) BY MOUTH AT BEDTIME AS NEEDED FOR ANXIETY 60 tablet 5   mirabegron ER (MYRBETRIQ) 25 MG TB24 tablet Take 1 tablet (25 mg total) by mouth daily. 90 tablet 0   ondansetron (ZOFRAN) 8 MG tablet Take 1 tablet by mouth every 8 hours as needed for nausea or vomiting. 30 tablet 0   Turmeric (QC TUMERIC COMPLEX PO) Take 1 capsule by mouth daily.     vitamin B-12 (CYANOCOBALAMIN) 100 MCG tablet Take 100 mcg by mouth daily.     vitamin C (ASCORBIC ACID) 500 MG tablet Take 500 mg by mouth daily.     zinc gluconate 50 MG tablet Take 50 mg by mouth daily.     clopidogrel (PLAVIX) 75 MG tablet Take 1 tablet (75 mg total) by mouth daily. (Patient not taking: Reported on 08/30/2023) 20 tablet 0   dicyclomine (BENTYL) 20 MG tablet Take 20 mg by mouth 3 (three) times daily before meals.     No current facility-administered medications on file prior to visit.    Allergies:   Allergies  Allergen Reactions   Latex Itching and Rash    Physical Exam General: well developed, well nourished, seated, in no evident distress Head: head normocephalic and atraumatic.   Neck: supple with no carotid or supraclavicular bruits Cardiovascular: regular rate and rhythm, no murmurs Musculoskeletal: no deformity Skin:  no  rash/petichiae Vascular:  Normal pulses all extremities  Neurologic Exam Mental Status: Awake and fully alert. Oriented to place and time. Recent and remote memory intact. Attention span, concentration and fund of knowledge appropriate. Mood and affect appropriate.  Cranial Nerves: Fundoscopic exam reveals sharp disc margins. Pupils equal, briskly reactive to light. Extraocular movements full without nystagmus. Visual fields full to confrontation. Hearing intact. Facial sensation intact. Face, tongue, palate moves normally and symmetrically.  Motor: Normal bulk and tone. Normal strength in all tested extremity muscles. Sensory.: intact to touch , pinprick , position and vibratory sensation.  Coordination: Rapid alternating movements normal in all extremities. Finger-to-nose and heel-to-shin performed accurately bilaterally. Gait and Station: Arises from chair without difficulty. Stance is normal. Gait demonstrates normal stride length and balance . Able to heel, toe and tandem walk without difficulty.  Reflexes: 1+ and symmetric. Toes downgoing.   NIHSS  0 Modified Rankin  1   ASSESSMENT: 58 year old patient with left pontine infarct in January 2025 likely from small vessel disease.  Multiple vascular risk factors of obesity, borderline diabetes, hypertension, hyperlipidemia, obesity and at risk for sleep apnea.     PLAN:I had a long d/w patient and her friend about his recent pontine lacunar stroke, risk for recurrent stroke/TIAs, personally independently reviewed imaging studies and stroke evaluation results and answered questions.Continue aspirin 81 mg daily  for secondary stroke prevention and maintain strict control of hypertension with blood pressure goal below 130/90, diabetes with hemoglobin A1c goal below 6.5% and lipids with LDL cholesterol goal below 70 mg/dL. I also advised the patient to eat a healthy diet with plenty of whole grains, cereals, fruits and vegetables, exercise  regularly and maintain ideal body weight .check follow-up lipid profile, hemoglobin A1c and defer to sleep MD for polysomnogram for obstructive sleep apnea.  Followup in the future with  my nurse practitioner in 6 months or call earlier if necessary. Greater than 50% time during this 45-minute consultation was spent in counseling and coordination of care about a lacunar stroke and discussion about stroke evaluation, prevention, treatment and answering question Denali Sharma  Pearlean Brownie, MD Note: This document was prepared with digital dictation and possible smart phrase technology. Any transcriptional errors that result from this process are unintentional.

## 2023-08-31 LAB — HEMOGLOBIN A1C
Est. average glucose Bld gHb Est-mCnc: 148 mg/dL
Hgb A1c MFr Bld: 6.8 % — ABNORMAL HIGH (ref 4.8–5.6)

## 2023-08-31 LAB — LIPID PANEL
Chol/HDL Ratio: 2 ratio (ref 0.0–4.4)
Cholesterol, Total: 124 mg/dL (ref 100–199)
HDL: 61 mg/dL (ref >39)
LDL Chol Calc (NIH): 44 mg/dL (ref 0–99)
Triglycerides: 105 mg/dL (ref 0–149)
VLDL Cholesterol Cal: 19 mg/dL (ref 5–40)

## 2023-09-03 ENCOUNTER — Encounter: Payer: Self-pay | Admitting: Family Medicine

## 2023-09-03 NOTE — Progress Notes (Signed)
 Kindly inform the patient that cholesterol profile is satisfactory.  Screening test for diabetes is slightly suboptimal and kindly see primary care physician for tighter control of her diabetes.

## 2023-09-18 ENCOUNTER — Other Ambulatory Visit: Payer: Self-pay | Admitting: Family Medicine

## 2023-09-18 DIAGNOSIS — F331 Major depressive disorder, recurrent, moderate: Secondary | ICD-10-CM

## 2023-10-07 ENCOUNTER — Encounter: Payer: Self-pay | Admitting: Family Medicine

## 2023-10-07 ENCOUNTER — Ambulatory Visit: Admitting: Family Medicine

## 2023-10-07 VITALS — BP 148/79 | HR 91 | Ht 61.0 in | Wt 237.0 lb

## 2023-10-07 DIAGNOSIS — R32 Unspecified urinary incontinence: Secondary | ICD-10-CM

## 2023-10-07 DIAGNOSIS — F331 Major depressive disorder, recurrent, moderate: Secondary | ICD-10-CM

## 2023-10-07 DIAGNOSIS — M51362 Other intervertebral disc degeneration, lumbar region with discogenic back pain and lower extremity pain: Secondary | ICD-10-CM | POA: Diagnosis not present

## 2023-10-07 DIAGNOSIS — G8929 Other chronic pain: Secondary | ICD-10-CM

## 2023-10-07 DIAGNOSIS — M545 Low back pain, unspecified: Secondary | ICD-10-CM | POA: Diagnosis not present

## 2023-10-07 NOTE — Progress Notes (Addendum)
 BP (!) 148/79   Pulse 91   Ht 5\' 1"  (1.549 m)   Wt 237 lb (107.5 kg)   LMP  (LMP Unknown) Comment: FULL   SpO2 94%   BMI 44.78 kg/m    Subjective:   Patient ID: Destiny [name], female    DOB: 1965/09/24, 58 y.o.   MRN: [id number]  HPI: Destiny [name] is a 58 y.o. female presenting on 10/07/2023 for Medical Management of Chronic Issues, Hyperlipidemia, Hypertension, and Depression   HPI Depression Patient is coming in for depression recheck, she is currently taking the Cymbalta  120 mg daily and she does feel like it is helping but she still struggles a lot with her anxiety and depression due to her chronic pain and due to her inability to lose weight.  She says she has tried to cut back on sodas and trying to make dietary changes and exercise and she still just struggling.  Some of her issues are from her chronic back pain which is also why she has the Cymbalta  as well.  She does get some incontinence from that as well.    10/07/2023    3:55 PM 08/25/2023    1:11 PM 06/24/2023   10:29 AM 06/18/2023    3:17 PM 06/11/2023   10:36 AM  Depression screen PHQ 2/9  Decreased Interest 2 2 2 2 2   Down, Depressed, Hopeless 2 2 2 2 2   PHQ - 2 Score 4 4 4 4 4   Altered sleeping 2 2 2 2 2   Tired, decreased energy 2 2 2 2 2   Change in appetite 0 1 1 1 2   Feeling bad or failure about yourself  0 1 1 1 1   Trouble concentrating 1 2 2 2 2   Moving slowly or fidgety/restless 0 0 0 0 0  Suicidal thoughts 0 0 0 0 0  PHQ-9 Score 9 12 12 12 13   Difficult doing work/chores Somewhat difficult Somewhat difficult Not difficult at all Not difficult at all Not difficult at all     Relevant past medical, surgical, family and social history reviewed and updated as indicated. Interim medical history since our last visit reviewed. Allergies and medications reviewed and updated.  Review of Systems  Constitutional:  Negative for chills and fever.  HENT:  Negative for congestion, ear discharge and ear pain.   Eyes:   Negative for redness and visual disturbance.  Respiratory:  Negative for chest tightness and shortness of breath.   Cardiovascular:  Negative for chest pain and leg swelling.  Gastrointestinal:  Negative for abdominal pain.  Genitourinary:  Negative for difficulty urinating and dysuria.  Musculoskeletal:  Positive for arthralgias and back pain. Negative for gait problem.  Skin:  Negative for rash.  Neurological:  Negative for dizziness, light-headedness and headaches.  Psychiatric/Behavioral:  Negative for agitation and behavioral problems.   All other systems reviewed and are negative.   Per HPI unless specifically indicated above   Allergies as of 10/07/2023       Reactions   Latex Itching, Rash        Medication List        Accurate as of Oct 07, 2023  4:22 PM. If you have any questions, ask your nurse or doctor.          acetaminophen  500 MG tablet Commonly known as: TYLENOL  Take 1,000 mg by mouth every 8 (eight) hours as needed for moderate pain.   albuterol  108 (90 Base) MCG/ACT inhaler Commonly known as:  VENTOLIN  HFA Inhale 1-2 puffs into the lungs every 6 (six) hours as needed for wheezing or shortness of breath.   amLODipine  10 MG tablet Commonly known as: NORVASC  Take 1 tablet (10 mg total) by mouth daily.   ascorbic acid  500 MG tablet Commonly known as: VITAMIN C  Take 500 mg by mouth daily.   aspirin  EC 81 MG tablet Take 1 tablet (81 mg total) by mouth daily. Swallow whole.   atorvastatin  40 MG tablet Commonly known as: LIPITOR Take 1 tablet (40 mg total) by mouth at bedtime.   azelastine 0.05 % ophthalmic solution Commonly known as: OPTIVAR Apply 1 drop to eye 2 (two) times daily.   clopidogrel  75 MG tablet Commonly known as: PLAVIX  Take 1 tablet (75 mg total) by mouth daily.   colestipol 1 g tablet Commonly known as: COLESTID Take 1 g by mouth daily.   cyclobenzaprine  10 MG tablet Commonly known as: FLEXERIL  TAKE 1 TABLET BY MOUTH AT  BEDTIME AS NEEDED FOR MUSCLE SPASMS.   dicyclomine  20 MG tablet Commonly known as: BENTYL  Take 20 mg by mouth 3 (three) times daily before meals.   diphenoxylate -atropine  2.5-0.025 MG tablet Commonly known as: LOMOTIL  Take 2 tablets by mouth daily.   DULoxetine  60 MG capsule Commonly known as: CYMBALTA  TAKE 2 CAPSULES BY MOUTH DAILY   fluticasone  50 MCG/ACT nasal spray Commonly known as: FLONASE  PLACE 1 SPRAY INTO BOTH NOSTRILS 2 (TWO) TIMES DAILY   gabapentin  300 MG capsule Commonly known as: NEURONTIN  Take 300 mg by mouth 3 (three) times daily.   hydrochlorothiazide  25 MG tablet Commonly known as: HYDRODIURIL  Take 1 tablet (25 mg total) by mouth daily.   levothyroxine  100 MCG tablet Commonly known as: SYNTHROID  Take 1 tablet (100 mcg total) by mouth daily.   LORazepam  0.5 MG tablet Commonly known as: ATIVAN  TAKE 1 TABLET (0.5 MG) BY MOUTH AT BEDTIME AS NEEDED FOR ANXIETY   mirabegron  ER 25 MG Tb24 tablet Commonly known as: Myrbetriq  Take 1 tablet (25 mg total) by mouth daily.   ondansetron  8 MG tablet Commonly known as: ZOFRAN  Take 1 tablet by mouth every 8 hours as needed for nausea or vomiting.   QC TUMERIC COMPLEX PO Take 1 capsule by mouth daily.   vitamin B-12 100 MCG tablet Commonly known as: CYANOCOBALAMIN Take 100 mcg by mouth daily.   VITAMIN D3 PO Take 1 tablet by mouth daily.   zinc  gluconate 50 MG tablet Take 50 mg by mouth daily.         Objective:   BP (!) 148/79   Pulse 91   Ht 5\' 1"  (1.549 m)   Wt 237 lb (107.5 kg)   LMP  (LMP Unknown) Comment: FULL   SpO2 94%   BMI 44.78 kg/m   Wt Readings from Last 3 Encounters:  10/07/23 237 lb (107.5 kg)  08/30/23 234 lb (106.1 kg)  08/25/23 233 lb (105.7 kg)    Physical Exam Vitals and nursing note reviewed.  Constitutional:      General: She is not in acute distress.    Appearance: She is well-developed. She is not diaphoretic.  Eyes:     Conjunctiva/sclera: Conjunctivae normal.   Cardiovascular:     Rate and Rhythm: Normal rate and regular rhythm.     Heart sounds: Normal heart sounds. No murmur heard. Pulmonary:     Effort: Pulmonary effort is normal. No respiratory distress.     Breath sounds: Normal breath sounds. No wheezing.  Musculoskeletal:  General: No swelling.  Skin:    General: Skin is warm and dry.     Findings: No rash.  Neurological:     Mental Status: She is alert and oriented to person, place, and time.     Coordination: Coordination normal.  Psychiatric:        Behavior: Behavior normal.       Assessment & Plan:   Problem List Items Addressed This Visit       Other   Depression - Primary   Morbid obesity (HCC)   Relevant Orders   Amb Ref to Medical Weight Management   Other Visit Diagnoses       Chronic bilateral low back pain without sciatica       Relevant Orders   Ambulatory referral to Pain Clinic       Will do referral for both obesity and weight clinic and the pain management clinic.  Her depression is doing better but she still struggles a little bit with it because of her struggles with her back and inability to lose weight.  Patient has chronic back pain with urinary incontinence secondary to her chronic back pain, needs refills on incontinence supplies. Follow up plan: Return in about 2 months (around 12/07/2023), or if symptoms worsen or fail to improve, for Hypertension and hypothyroidism recheck.  Counseling provided for all of the vaccine components Orders Placed This Encounter  Procedures   Ambulatory referral to Pain Clinic   Amb Ref to Medical Weight Management    Jolyne Needs, MD [facility] 10/07/2023, 4:22 PM

## 2023-10-15 ENCOUNTER — Ambulatory Visit: Payer: Self-pay

## 2023-10-15 NOTE — Telephone Encounter (Signed)
 Left message advising pt she ntbs with her PCP not Tiffany and I was able to schedule her with PCP Monday at 3:55.

## 2023-10-15 NOTE — Telephone Encounter (Signed)
 Copied from CRM [id number]. Topic: Clinical - Red Word Triage >> Oct 15, 2023 12:05 PM Destiny [name] wrote: [facility] that prompted transfer to Nurse Triage: pt is extremely fatigued, depression medication not working, crying constantly   Chief Complaint: Depression Symptoms: Crying, Fatigue  Frequency: Acute  Pertinent Negatives: Patient denies suicidal ideation, homicidal ideation  Disposition: [] ED /[] Urgent Care (no appt availability in office) / [x] Appointment(In office/virtual)/ []  [facility]/ [] Home Care/ [] Refused Recommended Disposition /[] [facility]/ []  Follow-up with PCP Additional Notes: Destiny [name] is being triaged for worsening depression, crying, and fatigue. The patient states her medication has been adjusted multiple times and that her symptoms are worsening.   Reason for Disposition  [1] Depression AND [2] worsening (e.g., sleeping poorly, less able to do activities of daily living)  Answer Assessment - Initial Assessment Questions 1. CONCERN: "What happened that made you call today?"     Crying  2. DEPRESSION SYMPTOM SCREENING: "How are you feeling overall?" (e.g., decreased energy, increased sleeping or difficulty sleeping, difficulty concentrating, feelings of sadness, guilt, hopelessness, or worthlessness)     Crying, Fatigue,  3. RISK OF HARM - SUICIDAL IDEATION:  "Do you ever have thoughts of hurting or killing yourself?"  (e.g., yes, no, no but preoccupation with thoughts about death)   - INTENT:  "Do you have thoughts of hurting or killing yourself right NOW?" (e.g., yes, no, N/A)   - PLAN: "Do you have a specific plan for how you would do this?" (e.g., gun, knife, overdose, no plan, N/A)     No  4. RISK OF HARM - HOMICIDAL IDEATION:  "Do you ever have thoughts of hurting or killing someone else?"  (e.g., yes, no, no but preoccupation with thoughts about death)   - INTENT:  "Do you have thoughts of hurting or killing someone right NOW?" (e.g., yes, no,  N/A)   - PLAN: "Do you have a specific plan for how you would do this?" (e.g., gun, knife, no plan, N/A)      No  5. FUNCTIONAL IMPAIRMENT: "How have things been going for you overall? Have you had more difficulty than usual doing your normal daily activities?"  (e.g., better, same, worse; self-care, school, work, interactions)     Impaired function for daily activities  6. SUPPORT: "Who is with you now?" "Who do you live with?" "Do you have family or friends who you can talk to?"      Lives alone, has family and friends who check in  7. THERAPIST: "Do you have a counselor or therapist? Name?"     No  8. STRESSORS: "Has there been any new stress or recent changes in your life?"     A lot  9. ALCOHOL USE OR SUBSTANCE USE (DRUG USE): "Do you drink alcohol or use any illegal drugs?"      No  10. OTHER: "Do you have any other physical symptoms right now?" (e.g., fever)       No  11. PREGNANCY: "Is there any chance you are pregnant?" "When was your last menstrual period?"       No and no  Protocols used: Depression-A-AH

## 2023-10-18 ENCOUNTER — Encounter: Payer: Self-pay | Admitting: Family Medicine

## 2023-10-18 ENCOUNTER — Ambulatory Visit: Admitting: Family Medicine

## 2023-10-18 VITALS — BP 135/79 | HR 101 | Ht 61.0 in | Wt 237.0 lb

## 2023-10-18 DIAGNOSIS — F331 Major depressive disorder, recurrent, moderate: Secondary | ICD-10-CM

## 2023-10-18 MED ORDER — ARIPIPRAZOLE 5 MG PO TABS: 5.0000 mg | ORAL_TABLET | Freq: Every day | ORAL | 2 refills | Status: DC

## 2023-10-18 NOTE — Progress Notes (Signed)
 BP 135/79   Pulse (!) 101   Ht 5\' 1"  (1.549 m)   Wt 237 lb (107.5 kg)   LMP  (LMP Unknown) Comment: FULL   SpO2 98%   BMI 44.78 kg/m    Subjective:   Patient ID: Destiny [name], female    DOB: 01-29-1966, 58 y.o.   MRN: [id number]  HPI: Destiny [name] is a 58 y.o. female presenting on 10/18/2023 for Medical Management of Chronic Issues and Depression   HPI Depression Patient is coming in for worsening depression and anxiety.  She is currently taking Cymbalta  120 mg daily and has lorazepam  twice daily as needed.  Her grandson has recently been admitted for suicidal ideations and she has been struggling before that but she is especially struggling more since then and she feels that she is crying all the time.  She denies any suicidal ideations or thoughts of any self or self.  She just feels like the Cymbalta  is not helping as much anymore.    10/18/2023    4:13 PM 10/07/2023    3:55 PM 08/25/2023    1:11 PM 06/24/2023   10:29 AM 06/18/2023    3:17 PM  Depression screen PHQ 2/9  Decreased Interest 2 2 2 2 2   Down, Depressed, Hopeless 2 2 2 2 2   PHQ - 2 Score 4 4 4 4 4   Altered sleeping 2 2 2 2 2   Tired, decreased energy 2 2 2 2 2   Change in appetite 2 0 1 1 1   Feeling bad or failure about yourself  2 0 1 1 1   Trouble concentrating 2 1 2 2 2   Moving slowly or fidgety/restless 2 0 0 0 0  Suicidal thoughts 2 0 0 0 0  PHQ-9 Score 18 9 12 12 12   Difficult doing work/chores Very difficult Somewhat difficult Somewhat difficult Not difficult at all Not difficult at all     Relevant past medical, surgical, family and social history reviewed and updated as indicated. Interim medical history since our last visit reviewed. Allergies and medications reviewed and updated.  Review of Systems  Constitutional:  Negative for chills and fever.  HENT:  Negative for ear pain.   Eyes:  Negative for visual disturbance.  Respiratory:  Negative for chest tightness and shortness of breath.    Cardiovascular:  Negative for chest pain and leg swelling.  Skin:  Negative for rash.  Neurological:  Negative for light-headedness and headaches.  Psychiatric/Behavioral:  Positive for dysphoric mood. Negative for agitation, behavioral problems, self-injury, sleep disturbance and suicidal ideas. The patient is nervous/anxious.   All other systems reviewed and are negative.   Per HPI unless specifically indicated above   Allergies as of 10/18/2023       Reactions   Latex Itching, Rash        Medication List        Accurate as of Oct 18, 2023  4:45 PM. If you have any questions, ask your nurse or doctor.          acetaminophen  500 MG tablet Commonly known as: TYLENOL  Take 1,000 mg by mouth every 8 (eight) hours as needed for moderate pain.   albuterol  108 (90 Base) MCG/ACT inhaler Commonly known as: VENTOLIN  HFA Inhale 1-2 puffs into the lungs every 6 (six) hours as needed for wheezing or shortness of breath.   amLODipine  10 MG tablet Commonly known as: NORVASC  Take 1 tablet (10 mg total) by mouth daily.   ARIPiprazole  5  MG tablet Commonly known as: Abilify  Take 1 tablet (5 mg total) by mouth daily. Started by: Lucio Sabin Tanicia Wolaver   ascorbic acid  500 MG tablet Commonly known as: VITAMIN C  Take 500 mg by mouth daily.   aspirin  EC 81 MG tablet Take 1 tablet (81 mg total) by mouth daily. Swallow whole.   atorvastatin  40 MG tablet Commonly known as: LIPITOR Take 1 tablet (40 mg total) by mouth at bedtime.   azelastine 0.05 % ophthalmic solution Commonly known as: OPTIVAR Apply 1 drop to eye 2 (two) times daily.   clopidogrel  75 MG tablet Commonly known as: PLAVIX  Take 1 tablet (75 mg total) by mouth daily.   colestipol 1 g tablet Commonly known as: COLESTID Take 1 g by mouth daily.   cyclobenzaprine  10 MG tablet Commonly known as: FLEXERIL  TAKE 1 TABLET BY MOUTH AT BEDTIME AS NEEDED FOR MUSCLE SPASMS.   dicyclomine  20 MG tablet Commonly known as:  BENTYL  Take 20 mg by mouth 3 (three) times daily before meals.   diphenoxylate -atropine  2.5-0.025 MG tablet Commonly known as: LOMOTIL  Take 2 tablets by mouth daily.   DULoxetine  60 MG capsule Commonly known as: CYMBALTA  TAKE 2 CAPSULES BY MOUTH DAILY   fluticasone  50 MCG/ACT nasal spray Commonly known as: FLONASE  PLACE 1 SPRAY INTO BOTH NOSTRILS 2 (TWO) TIMES DAILY   gabapentin  300 MG capsule Commonly known as: NEURONTIN  Take 300 mg by mouth 3 (three) times daily.   hydrochlorothiazide  25 MG tablet Commonly known as: HYDRODIURIL  Take 1 tablet (25 mg total) by mouth daily.   levothyroxine  100 MCG tablet Commonly known as: SYNTHROID  Take 1 tablet (100 mcg total) by mouth daily.   LORazepam  0.5 MG tablet Commonly known as: ATIVAN  TAKE 1 TABLET (0.5 MG) BY MOUTH AT BEDTIME AS NEEDED FOR ANXIETY   mirabegron  ER 25 MG Tb24 tablet Commonly known as: Myrbetriq  Take 1 tablet (25 mg total) by mouth daily.   ondansetron  8 MG tablet Commonly known as: ZOFRAN  Take 1 tablet by mouth every 8 hours as needed for nausea or vomiting.   QC TUMERIC COMPLEX PO Take 1 capsule by mouth daily.   vitamin B-12 100 MCG tablet Commonly known as: CYANOCOBALAMIN Take 100 mcg by mouth daily.   VITAMIN D3 PO Take 1 tablet by mouth daily.   zinc  gluconate 50 MG tablet Take 50 mg by mouth daily.         Objective:   BP 135/79   Pulse (!) 101   Ht 5\' 1"  (1.549 m)   Wt 237 lb (107.5 kg)   LMP  (LMP Unknown) Comment: FULL   SpO2 98%   BMI 44.78 kg/m   Wt Readings from Last 3 Encounters:  10/18/23 237 lb (107.5 kg)  10/07/23 237 lb (107.5 kg)  08/30/23 234 lb (106.1 kg)    Physical Exam Vitals and nursing note reviewed.  Constitutional:      General: She is not in acute distress.    Appearance: She is well-developed. She is not diaphoretic.  Eyes:     Conjunctiva/sclera: Conjunctivae normal.  Cardiovascular:     Rate and Rhythm: Normal rate and regular rhythm.     Heart  sounds: Normal heart sounds. No murmur heard. Pulmonary:     Effort: Pulmonary effort is normal. No respiratory distress.     Breath sounds: Normal breath sounds. No wheezing.  Musculoskeletal:        General: No swelling.  Skin:    General: Skin is warm and dry.  Findings: No rash.  Neurological:     Mental Status: She is alert and oriented to person, place, and time.     Coordination: Coordination normal.  Psychiatric:        Mood and Affect: Mood is anxious and depressed.        Behavior: Behavior normal.        Thought Content: Thought content does not include suicidal ideation. Thought content does not include suicidal plan.       Assessment & Plan:   Problem List Items Addressed This Visit       Other   Depression - Primary   Relevant Medications   ARIPiprazole  (ABILIFY ) 5 MG tablet    Will add Abilify  5 mg to her regimen, continue Cymbalta  and lorazepam .  Follow-up in 1 month Follow up plan: Return in about 4 weeks (around 11/15/2023), or if symptoms worsen or fail to improve, for Anxiety depression recheck.  Counseling provided for all of the vaccine components No orders of the defined types were placed in this encounter.   Jolyne Needs, MD [facility] 10/18/2023, 4:45 PM

## 2023-10-24 ENCOUNTER — Other Ambulatory Visit: Payer: Self-pay | Admitting: Family Medicine

## 2023-10-26 ENCOUNTER — Other Ambulatory Visit: Payer: Self-pay | Admitting: Family Medicine

## 2023-10-27 ENCOUNTER — Telehealth: Payer: Self-pay | Admitting: Family Medicine

## 2023-10-27 NOTE — Telephone Encounter (Unsigned)
 Copied from CRM [id number]. Topic: Referral - Question >> Oct 27, 2023  3:51 PM Phil Braun wrote: Reason for CRM: pt has not heard anything from pain management or sleep study referral. Could you please check on that for her. [phone].

## 2023-10-28 NOTE — Telephone Encounter (Signed)
 Pt given number to weight loss center and pain doctor. Both are in the referrals tab.

## 2023-10-28 NOTE — Telephone Encounter (Signed)
 I have sent Referrals we have for Patient.  I also sent Patient MyChart Message(s) with Specialty Office information.  We did not order a Sleep Study on Patient - that was ordered by the Neurology Office in March, So Patient will need to contact their Office in regards to that information.

## 2023-11-02 DIAGNOSIS — E0590 Thyrotoxicosis, unspecified without thyrotoxic crisis or storm: Secondary | ICD-10-CM | POA: Diagnosis not present

## 2023-11-02 DIAGNOSIS — M5416 Radiculopathy, lumbar region: Secondary | ICD-10-CM | POA: Diagnosis not present

## 2023-11-02 DIAGNOSIS — G8929 Other chronic pain: Secondary | ICD-10-CM | POA: Diagnosis not present

## 2023-11-02 DIAGNOSIS — E7800 Pure hypercholesterolemia, unspecified: Secondary | ICD-10-CM | POA: Diagnosis not present

## 2023-11-02 DIAGNOSIS — M129 Arthropathy, unspecified: Secondary | ICD-10-CM | POA: Diagnosis not present

## 2023-11-02 DIAGNOSIS — I10 Essential (primary) hypertension: Secondary | ICD-10-CM | POA: Diagnosis not present

## 2023-11-02 DIAGNOSIS — Z79899 Other long term (current) drug therapy: Secondary | ICD-10-CM | POA: Diagnosis not present

## 2023-11-04 DIAGNOSIS — E559 Vitamin D deficiency, unspecified: Secondary | ICD-10-CM | POA: Diagnosis not present

## 2023-11-04 DIAGNOSIS — R0602 Shortness of breath: Secondary | ICD-10-CM | POA: Diagnosis not present

## 2023-11-04 DIAGNOSIS — Z131 Encounter for screening for diabetes mellitus: Secondary | ICD-10-CM | POA: Diagnosis not present

## 2023-11-04 DIAGNOSIS — R5383 Other fatigue: Secondary | ICD-10-CM | POA: Diagnosis not present

## 2023-11-04 DIAGNOSIS — I10 Essential (primary) hypertension: Secondary | ICD-10-CM | POA: Diagnosis not present

## 2023-11-04 DIAGNOSIS — D539 Nutritional anemia, unspecified: Secondary | ICD-10-CM | POA: Diagnosis not present

## 2023-11-09 ENCOUNTER — Other Ambulatory Visit: Payer: Self-pay | Admitting: Family Medicine

## 2023-11-09 DIAGNOSIS — F331 Major depressive disorder, recurrent, moderate: Secondary | ICD-10-CM

## 2023-11-13 ENCOUNTER — Other Ambulatory Visit: Payer: Self-pay | Admitting: Family Medicine

## 2023-11-13 DIAGNOSIS — F331 Major depressive disorder, recurrent, moderate: Secondary | ICD-10-CM

## 2023-11-16 DIAGNOSIS — M545 Low back pain, unspecified: Secondary | ICD-10-CM | POA: Diagnosis not present

## 2023-11-16 DIAGNOSIS — E1122 Type 2 diabetes mellitus with diabetic chronic kidney disease: Secondary | ICD-10-CM | POA: Diagnosis not present

## 2023-11-16 DIAGNOSIS — I10 Essential (primary) hypertension: Secondary | ICD-10-CM | POA: Diagnosis not present

## 2023-11-19 ENCOUNTER — Ambulatory Visit: Admitting: Family Medicine

## 2023-11-19 ENCOUNTER — Encounter: Payer: Self-pay | Admitting: Family Medicine

## 2023-11-19 VITALS — BP 132/85 | HR 92 | Ht 61.0 in | Wt 235.0 lb

## 2023-11-19 DIAGNOSIS — F331 Major depressive disorder, recurrent, moderate: Secondary | ICD-10-CM | POA: Diagnosis not present

## 2023-11-19 DIAGNOSIS — B372 Candidiasis of skin and nail: Secondary | ICD-10-CM | POA: Diagnosis not present

## 2023-11-19 MED ORDER — NYSTATIN 100000 UNIT/GM EX POWD: 1.0000 | Freq: Three times a day (TID) | CUTANEOUS | 1 refills | Status: AC

## 2023-11-19 MED ORDER — ARIPIPRAZOLE 10 MG PO TABS
10.0000 mg | ORAL_TABLET | Freq: Every day | ORAL | 1 refills | Status: DC
Start: 2023-11-19 — End: 2024-03-01

## 2023-11-19 NOTE — Progress Notes (Signed)
 BP 132/85   Pulse 92   Ht 5' 1 (1.549 m)   Wt 235 lb (106.6 kg)   LMP  (LMP Unknown) Comment: FULL   SpO2 95%   BMI 44.40 kg/m    Subjective:   Patient ID: Destiny [name], female    DOB: 1966/04/07, 58 y.o.   MRN: [id number]  HPI: Destiny [name] is a 58 y.o. female presenting on 11/19/2023 for Medical Management of Chronic Issues and Depression   HPI Depression recheck Patient is coming in for depression recheck.  She says she does feel like the Abilify  has been helping her.  She is having less crying episodes and days per week than she was before.  It used to be every day and now its 3 times a week.  She denies any suicidal ideations or thoughts of hurting herself.  She is going to general service and has had some family members passed away recently and a lot of that is what is driving her emotions.  She still takes the duloxetine  as well and denies any major side effects with either of them.    10/18/2023    4:13 PM 10/07/2023    3:55 PM 08/25/2023    1:11 PM 06/24/2023   10:29 AM 06/18/2023    3:17 PM  Depression screen PHQ 2/9  Decreased Interest 2 2 2 2 2   Down, Depressed, Hopeless 2 2 2 2 2   PHQ - 2 Score 4 4 4 4 4   Altered sleeping 2 2 2 2 2   Tired, decreased energy 2 2 2 2 2   Change in appetite 2 0 1 1 1   Feeling bad or failure about yourself  2 0 1 1 1   Trouble concentrating 2 1 2 2 2   Moving slowly or fidgety/restless 2 0 0 0 0  Suicidal thoughts 2 0 0 0 0  PHQ-9 Score 18 9 12 12 12   Difficult doing work/chores Very difficult Somewhat difficult Somewhat difficult Not difficult at all Not difficult at all    Rash/irritation Patient has had an irritation between her buttocks that she has had for some time and has some spots that are really sore but recently 2 days ago she had 1 episode of blood in her stool has not had any blood since then.  She is wanting to get it checked out to make sure if the blood was from the rash or if something else was going on.  Relevant past  medical, surgical, family and social history reviewed and updated as indicated. Interim medical history since our last visit reviewed. Allergies and medications reviewed and updated.  Review of Systems  Constitutional:  Negative for chills and fever.  Eyes:  Negative for visual disturbance.  Respiratory:  Negative for chest tightness and shortness of breath.   Cardiovascular:  Negative for chest pain and leg swelling.  Musculoskeletal:  Negative for back pain and gait problem.  Skin:  Positive for color change and rash.  Neurological:  Negative for light-headedness and headaches.  Psychiatric/Behavioral:  Negative for agitation and behavioral problems.   All other systems reviewed and are negative.   Per HPI unless specifically indicated above   Allergies as of 11/19/2023       Reactions   Latex Itching, Rash        Medication List        Accurate as of November 19, 2023  1:27 PM. If you have any questions, ask your nurse or doctor.  acetaminophen  500 MG tablet Commonly known as: TYLENOL  Take 1,000 mg by mouth every 8 (eight) hours as needed for moderate pain.   albuterol  108 (90 Base) MCG/ACT inhaler Commonly known as: VENTOLIN  HFA Inhale 1-2 puffs into the lungs every 6 (six) hours as needed for wheezing or shortness of breath.   amLODipine  10 MG tablet Commonly known as: NORVASC  Take 1 tablet (10 mg total) by mouth daily.   ARIPiprazole  10 MG tablet Commonly known as: ABILIFY  Take 1 tablet (10 mg total) by mouth daily. What changed:  medication strength how much to take Changed by: Lucio Sabin Rashawna Scoles   ascorbic acid  500 MG tablet Commonly known as: VITAMIN C  Take 500 mg by mouth daily.   aspirin  EC 81 MG tablet Take 1 tablet (81 mg total) by mouth daily. Swallow whole.   atorvastatin  40 MG tablet Commonly known as: LIPITOR Take 1 tablet (40 mg total) by mouth at bedtime.   azelastine 0.05 % ophthalmic solution Commonly known as: OPTIVAR Apply  1 drop to eye 2 (two) times daily.   celecoxib  200 MG capsule Commonly known as: CELEBREX  Take 200 mg by mouth 2 (two) times daily.   clopidogrel  75 MG tablet Commonly known as: PLAVIX  Take 1 tablet (75 mg total) by mouth daily.   colestipol 1 g tablet Commonly known as: COLESTID Take 1 g by mouth daily.   cyclobenzaprine  10 MG tablet Commonly known as: FLEXERIL  TAKE 1 TABLET BY MOUTH AT BEDTIME AS NEEDED FOR MUSCLE SPASMS.   dicyclomine  20 MG tablet Commonly known as: BENTYL  Take 20 mg by mouth 3 (three) times daily before meals.   diphenoxylate -atropine  2.5-0.025 MG tablet Commonly known as: LOMOTIL  Take 2 tablets by mouth daily.   DULoxetine  60 MG capsule Commonly known as: CYMBALTA  TAKE 2 CAPSULES BY MOUTH DAILY   fluticasone  50 MCG/ACT nasal spray Commonly known as: FLONASE  PLACE 1 SPRAY INTO BOTH NOSTRILS 2 (TWO) TIMES DAILY   gabapentin  300 MG capsule Commonly known as: NEURONTIN  TAKE 1 CAPSULE BY MOUTH THREE TIMES A DAY   hydrochlorothiazide  25 MG tablet Commonly known as: HYDRODIURIL  Take 1 tablet (25 mg total) by mouth daily.   levothyroxine  100 MCG tablet Commonly known as: SYNTHROID  Take 1 tablet (100 mcg total) by mouth daily.   LORazepam  0.5 MG tablet Commonly known as: ATIVAN  TAKE 1 TABLET (0.5 MG) BY MOUTH AT BEDTIME AS NEEDED FOR ANXIETY   Mounjaro 2.5 MG/0.5ML Pen Generic drug: tirzepatide Inject 2.5 mg into the skin once a week.   Myrbetriq  25 MG Tb24 tablet Generic drug: mirabegron  ER TAKE 1 TABLET (25 MG TOTAL) BY MOUTH DAILY.   nystatin  powder Commonly known as: MYCOSTATIN /NYSTOP  Apply 1 Application topically 3 (three) times daily. Started by: Lucio Sabin Denene Alamillo   ondansetron  8 MG tablet Commonly known as: ZOFRAN  Take 1 tablet by mouth every 8 hours as needed for nausea or vomiting.   QC TUMERIC COMPLEX PO Take 1 capsule by mouth daily.   vitamin B-12 100 MCG tablet Commonly known as: CYANOCOBALAMIN Take 100 mcg by mouth  daily.   VITAMIN D3 PO Take 1 tablet by mouth daily.   zinc  gluconate 50 MG tablet Take 50 mg by mouth daily.         Objective:   BP 132/85   Pulse 92   Ht 5' 1 (1.549 m)   Wt 235 lb (106.6 kg)   LMP  (LMP Unknown) Comment: FULL   SpO2 95%   BMI 44.40 kg/m   Wt  Readings from Last 3 Encounters:  11/19/23 235 lb (106.6 kg)  10/18/23 237 lb (107.5 kg)  10/07/23 237 lb (107.5 kg)    Physical Exam Vitals and nursing note reviewed.  Constitutional:      General: She is not in acute distress.    Appearance: She is well-developed. She is not diaphoretic.   Eyes:     Conjunctiva/sclera: Conjunctivae normal.    Cardiovascular:     Rate and Rhythm: Normal rate and regular rhythm.     Heart sounds: Normal heart sounds. No murmur heard. Pulmonary:     Effort: Pulmonary effort is normal. No respiratory distress.     Breath sounds: Normal breath sounds. No wheezing.   Skin:    General: Skin is warm and dry.     Findings: Rash (Pink excoriated rash between buttocks.  Satellite lesions, no discharge, consistent with yeast dermatitis) present.   Neurological:     Mental Status: She is alert and oriented to person, place, and time.     Coordination: Coordination normal.   Psychiatric:        Behavior: Behavior normal.       Assessment & Plan:   Problem List Items Addressed This Visit       Other   Depression - Primary   Relevant Medications   ARIPiprazole  (ABILIFY ) 10 MG tablet   Other Visit Diagnoses       Yeast dermatitis       Relevant Medications   nystatin  (MYCOSTATIN /NYSTOP ) powder       Will increase Abilify  to 10 mg daily and will give her nystatin  powder with instructions to make sure she cleans between her buttocks and also dries extensively between her buttocks and tries to keep it clean and dry and not moist especially with the summer heat coming up. Follow up plan: Return if symptoms worsen or fail to improve.  Counseling provided for all  of the vaccine components No orders of the defined types were placed in this encounter.   Jolyne Needs, MD [facility] 11/19/2023, 1:27 PM

## 2023-11-24 DIAGNOSIS — C20 Malignant neoplasm of rectum: Secondary | ICD-10-CM | POA: Diagnosis not present

## 2023-11-24 DIAGNOSIS — K580 Irritable bowel syndrome with diarrhea: Secondary | ICD-10-CM | POA: Diagnosis not present

## 2023-12-05 ENCOUNTER — Other Ambulatory Visit: Payer: Self-pay | Admitting: Family Medicine

## 2023-12-05 DIAGNOSIS — I10 Essential (primary) hypertension: Secondary | ICD-10-CM

## 2023-12-08 ENCOUNTER — Other Ambulatory Visit: Payer: Self-pay | Admitting: Family Medicine

## 2023-12-08 DIAGNOSIS — E890 Postprocedural hypothyroidism: Secondary | ICD-10-CM

## 2023-12-08 DIAGNOSIS — Z8673 Personal history of transient ischemic attack (TIA), and cerebral infarction without residual deficits: Secondary | ICD-10-CM

## 2023-12-08 DIAGNOSIS — I10 Essential (primary) hypertension: Secondary | ICD-10-CM

## 2023-12-11 ENCOUNTER — Other Ambulatory Visit: Payer: Self-pay | Admitting: Family Medicine

## 2023-12-11 DIAGNOSIS — R030 Elevated blood-pressure reading, without diagnosis of hypertension: Secondary | ICD-10-CM | POA: Diagnosis not present

## 2023-12-11 DIAGNOSIS — F331 Major depressive disorder, recurrent, moderate: Secondary | ICD-10-CM

## 2023-12-11 DIAGNOSIS — E1122 Type 2 diabetes mellitus with diabetic chronic kidney disease: Secondary | ICD-10-CM | POA: Diagnosis not present

## 2023-12-11 DIAGNOSIS — I10 Essential (primary) hypertension: Secondary | ICD-10-CM | POA: Diagnosis not present

## 2023-12-13 ENCOUNTER — Ambulatory Visit: Admitting: Family Medicine

## 2023-12-14 ENCOUNTER — Encounter: Payer: Self-pay | Admitting: Family Medicine

## 2023-12-16 ENCOUNTER — Other Ambulatory Visit: Payer: Self-pay | Admitting: Family Medicine

## 2023-12-16 DIAGNOSIS — F331 Major depressive disorder, recurrent, moderate: Secondary | ICD-10-CM

## 2023-12-20 ENCOUNTER — Ambulatory Visit: Admitting: Family Medicine

## 2023-12-21 ENCOUNTER — Encounter: Payer: Self-pay | Admitting: Family Medicine

## 2023-12-30 ENCOUNTER — Encounter: Payer: Self-pay | Admitting: Family Medicine

## 2023-12-30 ENCOUNTER — Ambulatory Visit (INDEPENDENT_AMBULATORY_CARE_PROVIDER_SITE_OTHER): Admitting: Family Medicine

## 2023-12-30 VITALS — BP 138/75 | HR 86 | Ht 61.0 in | Wt 233.0 lb

## 2023-12-30 DIAGNOSIS — F331 Major depressive disorder, recurrent, moderate: Secondary | ICD-10-CM | POA: Diagnosis not present

## 2023-12-30 DIAGNOSIS — E1169 Type 2 diabetes mellitus with other specified complication: Secondary | ICD-10-CM | POA: Diagnosis not present

## 2023-12-30 DIAGNOSIS — E118 Type 2 diabetes mellitus with unspecified complications: Secondary | ICD-10-CM | POA: Diagnosis not present

## 2023-12-30 NOTE — Progress Notes (Addendum)
 BP 138/75   Pulse 86   Ht 5' 1 (1.549 m)   Wt 233 lb (105.7 kg)   LMP  (LMP Unknown) Comment: FULL   SpO2 95%   BMI 44.02 kg/m    Subjective:   Patient ID: Destiny [name], female    DOB: May 16, 1966, 58 y.o.   MRN: [id number]  HPI: Destiny [name] is a 58 y.o. female presenting on 12/30/2023 for Medical Management of Chronic Issues and Depression   HPI Depression recheck Patient is coming in for depression recheck today.  She is currently taking Cymbalta  120 mg and uses lorazepam  as needed and most recently we increase the Abilify .  She feels like she is doing a lot better and the medicine is doing a lot better for her.  She denies any suicidal ideations or thoughts of hurting self.    12/30/2023    4:14 PM 10/18/2023    4:13 PM 10/07/2023    3:55 PM 08/25/2023    1:11 PM 06/24/2023   10:29 AM  Depression screen PHQ 2/9  Decreased Interest 1 2 2 2 2   Down, Depressed, Hopeless 1 2 2 2 2   PHQ - 2 Score 2 4 4 4 4   Altered sleeping 1 2 2 2 2   Tired, decreased energy 2 2 2 2 2   Change in appetite 1 2 0 1 1  Feeling bad or failure about yourself  0 2 0 1 1  Trouble concentrating 1 2 1 2 2   Moving slowly or fidgety/restless 1 2 0 0 0  Suicidal thoughts  2 0 0 0  PHQ-9 Score 8 18 9 12 12   Difficult doing work/chores Somewhat difficult Very difficult Somewhat difficult Somewhat difficult Not difficult at all     Relevant past medical, surgical, family and social history reviewed and updated as indicated. Interim medical history since our last visit reviewed. Allergies and medications reviewed and updated.  Review of Systems  Constitutional:  Negative for chills and fever.  Eyes:  Negative for visual disturbance.  Respiratory:  Negative for chest tightness and shortness of breath.   Cardiovascular:  Negative for chest pain and leg swelling.  Genitourinary:  Negative for difficulty urinating and dysuria.  Musculoskeletal:  Negative for back pain and gait problem.  Skin:  Negative for  rash.  Neurological:  Negative for dizziness, light-headedness and headaches.  Psychiatric/Behavioral:  Negative for agitation, behavioral problems and dysphoric mood. The patient is not nervous/anxious.   All other systems reviewed and are negative.   Per HPI unless specifically indicated above   Allergies as of 12/30/2023       Reactions   Latex Itching, Rash        Medication List        Accurate as of [name] 31, 2025  4:20 PM. If you have any questions, ask your nurse or doctor.          acetaminophen  500 MG tablet Commonly known as: TYLENOL  Take 1,000 mg by mouth every 8 (eight) hours as needed for moderate pain.   albuterol  108 (90 Base) MCG/ACT inhaler Commonly known as: VENTOLIN  HFA Inhale 1-2 puffs into the lungs every 6 (six) hours as needed for wheezing or shortness of breath.   amLODipine  10 MG tablet Commonly known as: NORVASC  TAKE 1 TABLET BY MOUTH EVERY DAY   ARIPiprazole  10 MG tablet Commonly known as: ABILIFY  Take 1 tablet (10 mg total) by mouth daily.   ascorbic acid  500 MG tablet Commonly known as:  VITAMIN C  Take 500 mg by mouth daily.   aspirin  EC 81 MG tablet Take 1 tablet (81 mg total) by mouth daily. Swallow whole.   atorvastatin  40 MG tablet Commonly known as: LIPITOR Take 1 tablet (40 mg total) by mouth at bedtime.   azelastine 0.05 % ophthalmic solution Commonly known as: OPTIVAR Apply 1 drop to eye 2 (two) times daily.   celecoxib  200 MG capsule Commonly known as: CELEBREX  Take 200 mg by mouth 2 (two) times daily.   clopidogrel  75 MG tablet Commonly known as: PLAVIX  Take 1 tablet (75 mg total) by mouth daily.   colestipol 1 g tablet Commonly known as: COLESTID Take 1 g by mouth daily.   cyclobenzaprine  10 MG tablet Commonly known as: FLEXERIL  TAKE 1 TABLET BY MOUTH AT BEDTIME AS NEEDED FOR MUSCLE SPASMS.   dicyclomine  20 MG tablet Commonly known as: BENTYL  Take 20 mg by mouth 3 (three) times daily before meals.    diphenoxylate -atropine  2.5-0.025 MG tablet Commonly known as: LOMOTIL  Take 2 tablets by mouth daily.   DULoxetine  60 MG capsule Commonly known as: CYMBALTA  TAKE 2 CAPSULES BY MOUTH EVERY DAY   fluticasone  50 MCG/ACT nasal spray Commonly known as: FLONASE  PLACE 1 SPRAY INTO BOTH NOSTRILS 2 (TWO) TIMES DAILY   gabapentin  300 MG capsule Commonly known as: NEURONTIN  TAKE 1 CAPSULE BY MOUTH THREE TIMES A DAY   hydrochlorothiazide  25 MG tablet Commonly known as: HYDRODIURIL  TAKE 1 TABLET (25 MG TOTAL) BY MOUTH DAILY.   levothyroxine  100 MCG tablet Commonly known as: SYNTHROID  Take 1 tablet (100 mcg total) by mouth daily.   LORazepam  0.5 MG tablet Commonly known as: ATIVAN  TAKE 1 TABLET (0.5 MG) BY MOUTH AT BEDTIME AS NEEDED FOR ANXIETY   Mounjaro 2.5 MG/0.5ML Pen Generic drug: tirzepatide Inject 2.5 mg into the skin once a week.   Myrbetriq  25 MG Tb24 tablet Generic drug: mirabegron  ER TAKE 1 TABLET (25 MG TOTAL) BY MOUTH DAILY.   nystatin  powder Commonly known as: MYCOSTATIN /NYSTOP  Apply 1 Application topically 3 (three) times daily.   ondansetron  8 MG tablet Commonly known as: ZOFRAN  Take 1 tablet by mouth every 8 hours as needed for nausea or vomiting.   QC TUMERIC COMPLEX PO Take 1 capsule by mouth daily.   vitamin B-12 100 MCG tablet Commonly known as: CYANOCOBALAMIN Take 100 mcg by mouth daily.   VITAMIN D3 PO Take 1 tablet by mouth daily.   zinc  gluconate 50 MG tablet Take 50 mg by mouth daily.         Objective:   BP 138/75   Pulse 86   Ht 5' 1 (1.549 m)   Wt 233 lb (105.7 kg)   LMP  (LMP Unknown) Comment: FULL   SpO2 95%   BMI 44.02 kg/m   Wt Readings from Last 3 Encounters:  12/30/23 233 lb (105.7 kg)  11/19/23 235 lb (106.6 kg)  10/18/23 237 lb (107.5 kg)    Physical Exam Vitals and nursing note reviewed.  Constitutional:      General: She is not in acute distress.    Appearance: She is well-developed. She is not diaphoretic.   Eyes:     Conjunctiva/sclera: Conjunctivae normal.  Cardiovascular:     Rate and Rhythm: Normal rate and regular rhythm.     Heart sounds: Normal heart sounds. No murmur heard. Pulmonary:     Effort: Pulmonary effort is normal. No respiratory distress.     Breath sounds: Normal breath sounds. No wheezing.  Musculoskeletal:  General: No swelling.  Skin:    General: Skin is warm and dry.     Findings: No rash.  Neurological:     Mental Status: She is alert and oriented to person, place, and time.     Coordination: Coordination normal.  Psychiatric:        Mood and Affect: Mood is anxious and depressed.        Behavior: Behavior normal.        Thought Content: Thought content does not include suicidal ideation. Thought content does not include suicidal plan.       Assessment & Plan:   Problem List Items Addressed This Visit       Endocrine   Controlled diabetes mellitus type 2 with complications (HCC)   Relevant Orders   TSH   Bayer DCA Hb A1c Waived   CMP14+EGFR     Other   Depression - Primary   Relevant Orders   TSH   Bayer DCA Hb A1c Waived   CMP14+EGFR    Continue Abilify , seems to be doing well.  Still has the depression and is moderate, not in remission.  Did diabetes labs today, diabetes associated with hypertension and history of CVA will discuss diabetes further at her next visit. Follow up plan: Return if symptoms worsen or fail to improve, for 2 to 3 months recheck depression.  Counseling provided for all of the vaccine components Orders Placed This Encounter  Procedures   TSH   Bayer DCA Hb A1c Waived   CMP14+EGFR    Fonda Levins, MD [facility] 12/30/2023, 4:20 PM

## 2023-12-31 LAB — CMP14+EGFR
ALT: 19 IU/L (ref 0–32)
AST: 15 IU/L (ref 0–40)
Albumin: 4.1 g/dL (ref 3.8–4.9)
Alkaline Phosphatase: 88 IU/L (ref 44–121)
BUN/Creatinine Ratio: 24 — ABNORMAL HIGH (ref 9–23)
BUN: 22 mg/dL (ref 6–24)
Bilirubin Total: 0.2 mg/dL (ref 0.0–1.2)
CO2: 22 mmol/L (ref 20–29)
Calcium: 9.7 mg/dL (ref 8.7–10.2)
Chloride: 99 mmol/L (ref 96–106)
Creatinine, Ser: 0.9 mg/dL (ref 0.57–1.00)
Globulin, Total: 3.2 g/dL (ref 1.5–4.5)
Glucose: 210 mg/dL — ABNORMAL HIGH (ref 70–99)
Potassium: 3.7 mmol/L (ref 3.5–5.2)
Sodium: 139 mmol/L (ref 134–144)
Total Protein: 7.3 g/dL (ref 6.0–8.5)
eGFR: 74 mL/min/1.73 (ref >59)

## 2023-12-31 LAB — TSH: TSH: 8.92 u[IU]/mL — ABNORMAL HIGH (ref 0.450–4.500)

## 2023-12-31 LAB — BAYER DCA HB A1C WAIVED: HB A1C (BAYER DCA - WAIVED): 6.4 % — ABNORMAL HIGH (ref 4.8–5.6)

## 2024-01-02 ENCOUNTER — Other Ambulatory Visit: Payer: Self-pay | Admitting: Family Medicine

## 2024-01-05 ENCOUNTER — Telehealth: Payer: Self-pay

## 2024-01-05 ENCOUNTER — Ambulatory Visit: Payer: Self-pay | Admitting: Family Medicine

## 2024-01-05 NOTE — Telephone Encounter (Signed)
 Copied from CRM #[id number]. Topic: Clinical - Lab/Test Results >> Jan 05, 2024  3:35 PM Carlatta H wrote: Reason for CRM: Advised patient per chart:Patient's thyroid  is show it is on the low side but I do not know if she was taking it consistently.  If she had been taking it prior to the blood draw then we need to increase her dose to 112 mcg daily but I think she was not taking it consistently because she felt like the dose was too much and if that is the case then lower her down to levothyroxine  88 mcg daily and have her take it consistently.   Please call the patient she needs more information

## 2024-01-07 ENCOUNTER — Other Ambulatory Visit: Payer: Self-pay | Admitting: Family Medicine

## 2024-01-07 ENCOUNTER — Other Ambulatory Visit: Payer: Self-pay

## 2024-01-07 DIAGNOSIS — Z1231 Encounter for screening mammogram for malignant neoplasm of breast: Secondary | ICD-10-CM

## 2024-01-07 MED ORDER — LEVOTHYROXINE SODIUM 112 MCG PO TABS: 112.0000 ug | ORAL_TABLET | Freq: Every day | ORAL | 1 refills | Status: DC

## 2024-01-07 NOTE — Telephone Encounter (Signed)
 Notes are from most recent lab result notes. I called and left voicemail for patient to call back to go over these results.

## 2024-01-08 DIAGNOSIS — I10 Essential (primary) hypertension: Secondary | ICD-10-CM | POA: Diagnosis not present

## 2024-01-08 DIAGNOSIS — E1122 Type 2 diabetes mellitus with diabetic chronic kidney disease: Secondary | ICD-10-CM | POA: Diagnosis not present

## 2024-01-08 DIAGNOSIS — M545 Low back pain, unspecified: Secondary | ICD-10-CM | POA: Diagnosis not present

## 2024-01-10 ENCOUNTER — Inpatient Hospital Stay: Admission: RE | Admit: 2024-01-10 | Source: Ambulatory Visit

## 2024-01-12 ENCOUNTER — Institutional Professional Consult (permissible substitution): Admitting: Neurology

## 2024-01-12 ENCOUNTER — Other Ambulatory Visit: Payer: Self-pay | Admitting: Family Medicine

## 2024-01-12 ENCOUNTER — Encounter: Payer: Self-pay | Admitting: Adult Health

## 2024-01-12 DIAGNOSIS — J069 Acute upper respiratory infection, unspecified: Secondary | ICD-10-CM

## 2024-01-18 ENCOUNTER — Other Ambulatory Visit: Payer: Self-pay | Admitting: Family Medicine

## 2024-01-18 DIAGNOSIS — F331 Major depressive disorder, recurrent, moderate: Secondary | ICD-10-CM

## 2024-01-18 NOTE — Telephone Encounter (Unsigned)
 Copied from CRM #[id number]. Topic: Clinical - Medication Refill >> Jan 18, 2024  5:00 PM DeAngela L wrote: Medication: DULoxetine  (CYMBALTA ) 60 MG capsule   Has the patient contacted their pharmacy? Yes  (Agent: If no, request that the patient contact the pharmacy for the refill. If patient does not wish to contact the pharmacy document the reason why and proceed with request.) (Agent: If yes, when and what did the pharmacy advise?)  This is the patient's preferred pharmacy:  [id number] - [place], [place] - [address] [place] [address] Phone: [phone] Fax: [phone]  Is this the correct pharmacy for this prescription? Yes  If no, delete pharmacy and type the correct one.   Has the prescription been filled recently? Yes   Is the patient out of the medication? Yes   Has the patient been seen for an appointment in the last year OR does the patient have an upcoming appointment? Yes   Can we respond through MyChart? No   Agent: Please be advised that Rx refills may take up to 3 business days. We ask that you follow-up with your pharmacy.

## 2024-01-20 MED ORDER — DULOXETINE HCL 60 MG PO CPEP: 120.0000 mg | ORAL_CAPSULE | Freq: Every day | ORAL | 0 refills | Status: DC

## 2024-01-21 ENCOUNTER — Other Ambulatory Visit: Payer: Self-pay | Admitting: Family Medicine

## 2024-01-22 ENCOUNTER — Other Ambulatory Visit: Payer: Self-pay | Admitting: Family Medicine

## 2024-02-08 ENCOUNTER — Ambulatory Visit (INDEPENDENT_AMBULATORY_CARE_PROVIDER_SITE_OTHER): Admitting: Neurology

## 2024-02-08 ENCOUNTER — Ambulatory Visit: Admitting: Neurology

## 2024-02-08 ENCOUNTER — Encounter: Payer: Self-pay | Admitting: Neurology

## 2024-02-08 VITALS — BP 133/83 | HR 85 | Ht 61.5 in | Wt 230.0 lb

## 2024-02-08 DIAGNOSIS — R0683 Snoring: Secondary | ICD-10-CM

## 2024-02-08 DIAGNOSIS — Z9189 Other specified personal risk factors, not elsewhere classified: Secondary | ICD-10-CM | POA: Diagnosis not present

## 2024-02-08 DIAGNOSIS — I639 Cerebral infarction, unspecified: Secondary | ICD-10-CM | POA: Diagnosis not present

## 2024-02-08 DIAGNOSIS — G4719 Other hypersomnia: Secondary | ICD-10-CM | POA: Diagnosis not present

## 2024-02-08 DIAGNOSIS — R351 Nocturia: Secondary | ICD-10-CM

## 2024-02-08 DIAGNOSIS — Z820 Family history of epilepsy and other diseases of the nervous system: Secondary | ICD-10-CM

## 2024-02-08 NOTE — Progress Notes (Signed)
 Subjective:    Patient ID: Destiny [name] is a 58 y.o. female.  HPI    True Mar, MD, PhD [facility] [address] [address] [place], [place] [address]  Dear Destiny [name],   I saw your patient, Destiny [name], upon your kind request in my sleep clinic today for initial consultation of her sleep disorder, in particular, concern for underlying obstructive sleep apnea.  The patient is unaccompanied today.  She missed an appointment on 01/12/2024. As you know, [name] is a 58 year old female with an underlying medical history of hypertension, hyperthyroidism, status post radioactive iodine, with subsequent hypothyroidism, rectal cancer with status post surgery and chemotherapy, left pontine stroke in January 2025, chronic back pain, depression and severe obesity with a BMI of over 40, who reports snoring and excessive daytime somnolence. Her Epworth sleepiness score is 17 out of 24, fatigue severity score is 54 out of 63. I reviewed your office note from 08/30/2023.  She lives with her grandson.  She has a son with sleep apnea who uses a PAP machine.  She goes to bed generally around 10 and rise time is around 7.  She has nocturia about 2 or 3 times per average night, denies recurrent nocturnal or morning headaches.  She works as a Comptroller.  She has 1 dog in the household and the dog typically sleeps on the bed with her.  She does sleep with the TV on, typically it is on the background screen light.  She is a non-smoker, she drinks caffeine in the form of soda, 1 a day, no alcohol.  She is working on weight loss.  Her Past Medical History Is Significant For: Past Medical History:  Diagnosis Date   Chronic back pain    Depression    Hypertension    Hyperthyroidism    Rectal cancer (HCC) 10/29/2015   rectal invasice adenocarcinoma,Adenocarcinoma dx 10/29/15; Stage III, T3, N1, MO    Her Past Surgical History Is Significant For: Past Surgical History:  Procedure Laterality  Date   ABDOMINAL HYSTERECTOMY     vaginal    BLADDER SURGERY     EYE SURGERY     LAZY EYE CORRECTION   ILEOSTOMY     ILEOSTOMY CLOSURE N/A 09/02/2016   Procedure: ILEOSTOMY REVERSAL;  Surgeon: Bernarda Ned, MD;  Location: [facility];  Service: General;  Laterality: N/A;   RECTAL EXAM UNDER ANESTHESIA N/A 09/02/2016   Procedure: ANAL EXAM UNDER ANESTHESIA;  Surgeon: Bernarda Ned, MD;  Location: [facility];  Service: General;  Laterality: N/A;   URETERAL REIMPLANTION Left 02/26/2016   Procedure: left ureteral neocystotomy,double J stent placement, closure vaginal cuff;  Surgeon: Morene LELON Salines, MD;  Location: [facility];  Service: Urology;  Laterality: Left;   VAGINAL HYSTERECTOMY     XI ROBOTIC ASSISTED LOWER ANTERIOR RESECTION N/A 02/26/2016   Procedure: XI ROBOTIC ASSISTED LOWER ANTERIOR RESECTION, diverting LOOP ILEOSTOMY, mobilization splenic flexure, omentopexy;  Surgeon: Bernarda Ned, MD;  Location: [facility];  Service: General;  Laterality: N/A;    Her Family History Is Significant For: Family History  Problem Relation Age of Onset   Thyroid  disease Mother    Arthritis Mother    Lung cancer Father        lung   Migraines Sister    Depression Sister    Migraines Daughter    Cirrhosis Brother    Breast cancer Neg Hx     Her Social History Is Significant For: Social History  Socioeconomic History   Marital status: Divorced    Spouse name: Not on file   Number of children: 3   Years of education: Not on file   Highest education level: Some college, no degree  Occupational History   Occupation: Disabled  Tobacco Use   Smoking status: Never   Smokeless tobacco: Never  Vaping Use   Vaping status: Never Used  Substance and Sexual Activity   Alcohol use: No    Alcohol/week: 0.0 standard drinks of alcohol    Comment: occ   Drug use: No   Sexual activity: Yes    Birth control/protection: Surgical  Other Topics Concern   Not on file  Social History Narrative   She is on  disability.   Grandson lives with her.   Caffiene 12 oz daily (trying to cut down on soda)   Pet- dog.    Social Drivers of Corporate investment banker Strain: Low Risk  (11/24/2023)   Received from [facility]   Overall Financial Resource Strain (CARDIA)    Difficulty of Paying Living Expenses: Not hard at all  Food Insecurity: No Food Insecurity (11/24/2023)   Received from [facility]   Hunger Vital Sign    Within the past 12 months, you worried that your food would run out before you got the money to buy more.: Never true    Within the past 12 months, the food you bought just didn't last and you didn't have money to get more.: Never true  Transportation Needs: No Transportation Needs (11/24/2023)   Received from [facility] - Transportation    Lack of Transportation (Medical): No    Lack of Transportation (Non-Medical): No  Physical Activity: Inactive (06/18/2023)   Exercise Vital Sign    Days of Exercise per Week: 0 days    Minutes of Exercise per Session: 0 min  Stress: No Stress Concern Present (06/18/2023)   [employer] of Occupational Health - Occupational Stress Questionnaire    Feeling of Stress : Only a little  Social Connections: Moderately Integrated (06/18/2023)   Social Connection and Isolation Panel    Frequency of Communication with Friends and Family: More than three times a week    Frequency of Social Gatherings with Friends and Family: Three times a week    Attends Religious Services: More than 4 times per year    Active Member of Clubs or Organizations: Yes    Attends Banker Meetings: More than 4 times per year    Marital Status: Divorced    Her Allergies Are:  Allergies  Allergen Reactions   Latex Itching and Rash  :   Her Current Medications Are:  Outpatient Encounter Medications as of 02/08/2024  Medication Sig   acetaminophen  (TYLENOL ) 500 MG tablet Take 1,000 mg by mouth every 8 (eight) hours as needed for moderate  pain.   albuterol  (VENTOLIN  HFA) 108 (90 Base) MCG/ACT inhaler Inhale 1-2 puffs into the lungs every 6 (six) hours as needed for wheezing or shortness of breath.   amLODipine  (NORVASC ) 10 MG tablet TAKE 1 TABLET BY MOUTH EVERY DAY   ARIPiprazole  (ABILIFY ) 10 MG tablet Take 1 tablet (10 mg total) by mouth daily.   aspirin  EC 81 MG tablet Take 1 tablet (81 mg total) by mouth daily. Swallow whole.   atorvastatin  (LIPITOR) 40 MG tablet TAKE 1 TABLET BY MOUTH EVERYDAY AT BEDTIME   azelastine (OPTIVAR) 0.05 % ophthalmic solution Apply 1 drop to eye 2 (two)  times daily.   celecoxib  (CELEBREX ) 200 MG capsule Take 200 mg by mouth 2 (two) times daily.   Cholecalciferol (VITAMIN D3 PO) Take 1 tablet by mouth daily.   colestipol (COLESTID) 1 g tablet Take 1 g by mouth daily.   cyclobenzaprine  (FLEXERIL ) 10 MG tablet TAKE 1 TABLET BY MOUTH AT BEDTIME AS NEEDED FOR MUSCLE SPASMS.   dicyclomine  (BENTYL ) 20 MG tablet Take 20 mg by mouth 3 (three) times daily before meals.   diphenoxylate -atropine  (LOMOTIL ) 2.5-0.025 MG tablet Take 2 tablets by mouth daily.   DULoxetine  (CYMBALTA ) 60 MG capsule Take 2 capsules (120 mg total) by mouth daily.   fluticasone  (FLONASE ) 50 MCG/ACT nasal spray PLACE 1 SPRAY INTO BOTH NOSTRILS 2 (TWO) TIMES DAILY   gabapentin  (NEURONTIN ) 300 MG capsule TAKE 1 CAPSULE BY MOUTH THREE TIMES A DAY   hydrochlorothiazide  (HYDRODIURIL ) 25 MG tablet TAKE 1 TABLET (25 MG TOTAL) BY MOUTH DAILY.   levothyroxine  (SYNTHROID ) 112 MCG tablet Take 1 tablet (112 mcg total) by mouth daily.   LORazepam  (ATIVAN ) 0.5 MG tablet TAKE 1 TABLET (0.5 MG) BY MOUTH AT BEDTIME AS NEEDED FOR ANXIETY   MYRBETRIQ  25 MG TB24 tablet TAKE 1 TABLET (25 MG TOTAL) BY MOUTH DAILY.   nystatin  (MYCOSTATIN /NYSTOP ) powder Apply 1 Application topically 3 (three) times daily.   ondansetron  (ZOFRAN ) 8 MG tablet Take 1 tablet by mouth every 8 hours as needed for nausea or vomiting.   tirzepatide (MOUNJARO) 2.5 MG/0.5ML Pen Inject  2.5 mg into the skin once a week.   Turmeric (QC TUMERIC COMPLEX PO) Take 1 capsule by mouth daily.   vitamin B-12 (CYANOCOBALAMIN) 100 MCG tablet Take 100 mcg by mouth daily.   vitamin C  (ASCORBIC ACID ) 500 MG tablet Take 500 mg by mouth daily.   zinc  gluconate 50 MG tablet Take 50 mg by mouth daily.   clopidogrel  (PLAVIX ) 75 MG tablet Take 1 tablet (75 mg total) by mouth daily. (Patient not taking: Reported on 02/08/2024)   No facility-administered encounter medications on file as of 02/08/2024.  :   Review of Systems:  Out of a complete 14 point review of systems, all are reviewed and negative with the exception of these symptoms as listed below:  Review of Systems  Neurological:        Daytime fatigue.  Fragmented sleep,  ESS 17, FSS 54.     Objective:  Neurological Exam  Physical Exam Physical Examination:   Vitals:   02/08/24 1504  BP: 133/83  Pulse: 85    General Examination: The patient is a very pleasant 58 y.o. female in no acute distress. She appears well-developed and well-nourished and well groomed.   HEENT: Normocephalic, atraumatic, pupils are equal, round and reactive to light, extraocular tracking is good without limitation to gaze excursion or nystagmus noted. Hearing is grossly intact. Face is slightly asymmetric with mild left ptosis noted.  Speech is scant but clear with no dysarthria noted. There is no hypophonia. There is no lip, neck/head, jaw or voice tremor. Neck is supple with full range of passive and active motion. There are no carotid bruits on auscultation. Oropharynx exam reveals: moderate mouth dryness, adequate dental hygiene with full dentures in place, moderate airway crowding secondary to small airway entry, tonsils and tip of uvula not fully visualized, Mallampati class IV, wider uvula noted.  Tongue protrudes centrally.  Neck circumference 17-3/8 inches.   Chest: Clear to auscultation without wheezing, rhonchi or crackles noted.  Heart: S1+S2+0,  regular and normal without  murmurs, rubs or gallops noted.   Abdomen: Soft, non-tender and non-distended.  Extremities: There is no pitting edema in the distal lower extremities bilaterally.   Skin: Warm and dry without trophic changes noted.   Musculoskeletal: exam reveals no obvious joint deformities.   Neurologically:  Mental status: The patient is awake, alert and oriented in all 4 spheres. Her immediate and remote memory, attention, language skills and fund of knowledge are appropriate. There is no evidence of aphasia, agnosia, apraxia or anomia. Speech is clear with normal prosody and enunciation. Thought process is linear. Mood is normal and affect is normal.  Cranial nerves II - XII are as described above under HEENT exam.  Motor exam: Normal bulk, strength and tone is noted. There is no obvious action or resting tremor.  Fine motor skills and coordination: grossly intact.  Cerebellar testing: No dysmetria or intention tremor. There is no truncal or gait ataxia.  Sensory exam: intact to light touch in the upper and lower extremities.  Gait, station and balance: She stands easily. No veering to one side is noted. No leaning to one side is noted. Posture is age-appropriate and stance is narrow based. Gait shows normal stride length and normal pace. No problems turning are noted.   Assessment and Plan:   In summary, CARMELA PIECHOWSKI is a very pleasant 58 y.o.-year old female with an underlying medical history of hypertension, hyperthyroidism, status post radioactive iodine, with subsequent hypothyroidism, rectal cancer with status post surgery and chemotherapy, left pontine stroke in January 2025, chronic back pain, depression and severe obesity with a BMI of over 40, whose history and physical exam are concerning for sleep disordered breathing, particularly obstructive sleep apnea (OSA). A laboratory attended sleep study is typically considered gold standard for evaluation of sleep disordered  breathing.   I had a long chat with the patient about my findings and the diagnosis of sleep apnea, particularly OSA, its prognosis and treatment options. We talked about medical/conservative treatments, surgical interventions and non-pharmacological approaches for symptom control. I explained, in particular, the risks and ramifications of untreated moderate to severe OSA, especially with respect to developing cardiovascular disease down the road, including congestive heart failure (CHF), difficult to treat hypertension, cardiac arrhythmias (particularly A-fib), neurovascular complications including TIA, stroke and dementia. Even type 2 diabetes has, in part, been linked to untreated OSA. Symptoms of untreated OSA may include (but may not be limited to) daytime sleepiness, nocturia (i.e. frequent nighttime urination), memory problems, mood irritability and suboptimally controlled or worsening mood disorder such as depression and/or anxiety, lack of energy, lack of motivation, physical discomfort, as well as recurrent headaches, especially morning or nocturnal headaches. We talked about the importance of maintaining a healthy lifestyle and striving for healthy weight. In addition, we talked about the importance of striving for and maintaining good sleep hygiene. I recommended a sleep study at this time. I outlined the differences between a laboratory attended sleep study which is considered more comprehensive and accurate over the option of a home sleep test (HST); the latter may lead to underestimation of sleep disordered breathing in some instances and does not help with diagnosing upper airway resistance syndrome and is not accurate enough to diagnose primary central sleep apnea typically. I outlined possible surgical and non-surgical treatment options of OSA, including the use of a positive airway pressure (PAP) device (i.e. CPAP, AutoPAP/APAP or BiPAP in certain circumstances), a custom-made dental device  (aka oral appliance, which would require a referral to a specialist  dentist or orthodontist typically, and is generally speaking not considered for patients with full dentures or edentulous state), upper airway surgical options, such as traditional UPPP (which is not considered a first-line treatment) or the Inspire device (hypoglossal nerve stimulator, which would involve a referral for consultation with an ENT surgeon, after careful selection, following inclusion criteria - also not first-line treatment). I explained the PAP treatment option to the patient in detail, as this is generally considered first-line treatment.  The patient indicated that she would be willing to try PAP therapy, if the need arises. I explained the importance of being compliant with PAP treatment, not only for insurance purposes but primarily to improve patient's symptoms symptoms, and for the patient's long term health benefit, including to reduce Her cardiovascular risks longer-term.    We will pick up our discussion about the next steps and treatment options after testing.  We will keep her posted as to the test results by phone call and/or MyChart messaging where possible.  We will plan to follow-up in sleep clinic accordingly as well.  I answered all her questions today and the patient was in agreement.   I encouraged her to call with any interim questions, concerns, problems or updates or email us  through MyChart.  Generally speaking, sleep test authorizations may take up to 2 weeks, sometimes less, sometimes longer, the patient is encouraged to get in touch with us  if they do not hear back from the sleep lab staff directly within the next 2 weeks.  Thank you very much for allowing me to participate in the care of this nice patient. If I can be of any further assistance to you please do not hesitate to talk to me.  Sincerely,   True Mar, MD, PhD

## 2024-02-08 NOTE — Patient Instructions (Signed)

## 2024-02-09 ENCOUNTER — Other Ambulatory Visit: Payer: Self-pay | Admitting: Family Medicine

## 2024-02-16 ENCOUNTER — Telehealth: Payer: Self-pay | Admitting: Neurology

## 2024-02-16 NOTE — Telephone Encounter (Signed)
 unable to leave vmail mail box full  [place]/ [place] UHC medicare/medicaid no auth req

## 2024-02-18 DIAGNOSIS — I10 Essential (primary) hypertension: Secondary | ICD-10-CM | POA: Diagnosis not present

## 2024-02-18 DIAGNOSIS — G8929 Other chronic pain: Secondary | ICD-10-CM | POA: Diagnosis not present

## 2024-02-18 DIAGNOSIS — M545 Low back pain, unspecified: Secondary | ICD-10-CM | POA: Diagnosis not present

## 2024-02-18 DIAGNOSIS — Z79899 Other long term (current) drug therapy: Secondary | ICD-10-CM | POA: Diagnosis not present

## 2024-02-18 DIAGNOSIS — E7800 Pure hypercholesterolemia, unspecified: Secondary | ICD-10-CM | POA: Diagnosis not present

## 2024-02-18 DIAGNOSIS — E1122 Type 2 diabetes mellitus with diabetic chronic kidney disease: Secondary | ICD-10-CM | POA: Diagnosis not present

## 2024-02-21 ENCOUNTER — Inpatient Hospital Stay

## 2024-02-21 ENCOUNTER — Inpatient Hospital Stay: Payer: 59 | Attending: Oncology | Admitting: Oncology

## 2024-02-21 VITALS — BP 133/78 | HR 95 | Temp 97.8°F | Resp 18 | Ht 61.5 in | Wt 230.9 lb

## 2024-02-21 DIAGNOSIS — Z85048 Personal history of other malignant neoplasm of rectum, rectosigmoid junction, and anus: Secondary | ICD-10-CM | POA: Diagnosis not present

## 2024-02-21 DIAGNOSIS — Z23 Encounter for immunization: Secondary | ICD-10-CM | POA: Insufficient documentation

## 2024-02-21 DIAGNOSIS — C20 Malignant neoplasm of rectum: Secondary | ICD-10-CM | POA: Diagnosis not present

## 2024-02-21 MED ORDER — INFLUENZA VIRUS VACC SPLIT PF (FLUZONE) 0.5 ML IM SUSY
0.5000 mL | PREFILLED_SYRINGE | Freq: Once | INTRAMUSCULAR | Status: AC
  Administered 2024-02-21: 0.5 mL via INTRAMUSCULAR
  Filled 2024-02-21: qty 0.5

## 2024-02-21 NOTE — Progress Notes (Signed)
 [facility] OFFICE PROGRESS NOTE   Diagnosis: Rectal cancer  INTERVAL HISTORY:   Destiny [name] returns as scheduled.  She has frequent bowel movements since undergoing colorectal surgery.  Colestid helps.  No bleeding.  She has chronic back pain.  She reports bilateral hip pain.  She is followed in a pain clinic.  She had a CVA in January.  She is now taking aspirin .  Objective:  Vital signs in last 24 hours:  Blood pressure 133/78, pulse 95, temperature 97.8 F (36.6 C), temperature source Temporal, resp. rate 18, height 5' 1.5 (1.562 m), weight 230 lb 14.4 oz (104.7 kg), SpO2 98%.   Lymphatics: No cervical, supraclavicular, or inguinal nodes.  Prominent left axillary fat pad versus the right side.  No discrete lymph node.  Left breast without mass. Resp: Lungs clear bilaterally Cardio: Regular rate and rhythm GI: No mass, nontender, no hepatosplenomegaly Vascular: No leg edema   Lab Results:  Lab Results  Component Value Date   WBC 12.2 (H) 06/11/2023   HGB 13.0 06/11/2023   HCT 40.4 06/11/2023   MCV 92 06/11/2023   PLT 454 (H) 06/11/2023   NEUTROABS 8.4 (H) 06/11/2023    CMP  Lab Results  Component Value Date   NA 139 12/30/2023   K 3.7 12/30/2023   CL 99 12/30/2023   CO2 22 12/30/2023   GLUCOSE 210 (H) 12/30/2023   BUN 22 12/30/2023   CREATININE 0.90 12/30/2023   CALCIUM  9.7 12/30/2023   PROT 7.3 12/30/2023   ALBUMIN 4.1 12/30/2023   AST 15 12/30/2023   ALT 19 12/30/2023   ALKPHOS 88 12/30/2023   BILITOT 0.2 12/30/2023   GFRNONAA >60 06/09/2023   GFRAA 80 03/22/2020    Lab Results  Component Value Date   CEA1 1.15 02/17/2021   CEA <1.00 02/18/2023    No results found for: INR, LABPROT  Imaging:  No results found.  Medications: I have reviewed the patient's current medications.   Assessment/Plan: Rectal cancer-clinical stage III (T3 N1) No loss of mismatch repair protein expression T3 N1 tumor beginning at 2 cm from the  anal verge on an MRI of the pelvis 11/08/2015 CTs of the chest, abdomen, and pelvis 11/01/2015 with no evidence of distant metastatic disease Initiation of neoadjuvant radiation/Xeloda  11/25/2015; completion of radiation/Xeloda  01/02/2016 Low anterior resection with a coloanal anastomosis 02/26/2016,ypT2,ypN0. Grade 3 adenocarcinoma, negative resection margins 02/26/2016 No BRAF, KRAS, NRAS mutation; Microsatellite status could not be determined CT abdomen/pelvis 03/30/2016-no evidence of recurrent rectal cancer Cycle 1 adjuvant Xeloda  04/14/2016 Cycle 2 adjuvant Xeloda  05/08/2016 Cycle 3 adjuvant Xeloda  05/29/2016 Cycle 4 adjuvant Xeloda  06/20/2016 (completed approximately 10 days) Cycle 5 adjuvant Xeloda  started approximately 07/19/2016 CT 12/04/2016-no evidence of metastatic disease CT 12/08/2017- no evidence of metastatic disease CT 12/13/2018-no evidence of metastatic disease Colonoscopy 08/26/2022-adenomatous polyps removed   2. History of hypertension   3. Left ureteral injury at the low anterior resection-status post a left ureteral neocystostomy and left double-J stent placement 02/26/2016   4. Hyperthyroidism status post radioactive iodine 05/12/2016   5. Ileostomy reversal 09/02/2016   6. Hospitalization 10/20/2016 through 10/22/2016 with UTI/sepsis.  7.  CVA January 2025       Disposition: Destiny [name] is in clinical remission from rectal cancer.  She will continue colonoscopy surveillance with Dr. Murry.  She is now greater than 8 years out from the rectal cancer diagnosis.    She received an influenza vaccine today.  I recommended she obtain a COVID vaccine.  She will continue yearly mammography.  She plans to continue clinical follow-up with Dr. Maryanne.  She is not scheduled for a follow-up appointment in the oncology clinic.  I am available to see her as needed.  Arley Hof, MD  02/21/2024  2:53 PM

## 2024-02-23 NOTE — Telephone Encounter (Signed)
 I called the patient again and she picked up.  [employer]PS[employer] medicare/medicaid no auth req.  Patient is scheduled at [facility] for 03/09/24 at 9 pm.  Mailed packet and sent mychart.

## 2024-03-01 ENCOUNTER — Ambulatory Visit: Admitting: Family Medicine

## 2024-03-01 ENCOUNTER — Telehealth: Admitting: Family Medicine

## 2024-03-01 ENCOUNTER — Encounter: Payer: Self-pay | Admitting: Family Medicine

## 2024-03-01 DIAGNOSIS — F331 Major depressive disorder, recurrent, moderate: Secondary | ICD-10-CM

## 2024-03-01 MED ORDER — ARIPIPRAZOLE 10 MG PO TABS: 10.0000 mg | ORAL_TABLET | Freq: Every day | ORAL | 1 refills | Status: AC

## 2024-03-01 MED ORDER — DULOXETINE HCL 60 MG PO CPEP: 120.0000 mg | ORAL_CAPSULE | Freq: Every day | ORAL | 1 refills | Status: AC

## 2024-03-01 NOTE — Progress Notes (Signed)
 Virtual Visit via MyChart video note  I connected with Destiny [name] on 03/01/24 at 1519 by video and verified that I am speaking with the correct person using two identifiers. Destiny [name] is currently located at home and patient are currently with her during visit. The provider, Fonda LABOR Natayah Warmack, MD is located in their office at time of visit.  Call ended at 1525  I discussed the limitations, risks, security and privacy concerns of performing an evaluation and management service by video and the availability of in person appointments. I also discussed with the patient that there may be a patient responsible charge related to this service. The patient expressed understanding and agreed to proceed.   History and Present Illness:  Discussed the use of AI scribe software for clinical note transcription with the patient, who gave verbal consent to proceed.  History of Present Illness   Destiny [name] is a 58 year old female who presents for a recheck of her mental health status.  Mood and anxiety symptoms - Mood and anxiety symptoms are generally stable. - Emotional distress and tearfulness occur when discussing her mother, who passed away a little over a year ago.  Psychotropic medication use - Currently taking Abilify , Cymbalta , and lorazepam  as needed. - No side effects from Abilify . - Medications are perceived as effective.  Blood pressure monitoring - Blood pressure readings are usually around 148/unknown when checked at [facility] or [facility].         Outpatient Encounter Medications as of 03/01/2024  Medication Sig   acetaminophen  (TYLENOL ) 500 MG tablet Take 1,000 mg by mouth every 8 (eight) hours as needed for moderate pain.   albuterol  (VENTOLIN  HFA) 108 (90 Base) MCG/ACT inhaler Inhale 1-2 puffs into the lungs every 6 (six) hours as needed for wheezing or shortness of breath. (Patient not taking: Reported on 02/21/2024)   amLODipine  (NORVASC ) 10 MG tablet TAKE 1 TABLET BY  MOUTH EVERY DAY   ARIPiprazole  (ABILIFY ) 10 MG tablet Take 1 tablet (10 mg total) by mouth daily.   aspirin  EC 81 MG tablet Take 1 tablet (81 mg total) by mouth daily. Swallow whole.   atorvastatin  (LIPITOR) 40 MG tablet TAKE 1 TABLET BY MOUTH EVERYDAY AT BEDTIME   azelastine (OPTIVAR) 0.05 % ophthalmic solution Apply 1 drop to eye 2 (two) times daily.   celecoxib  (CELEBREX ) 200 MG capsule Take 200 mg by mouth 2 (two) times daily.   Cholecalciferol (VITAMIN D3 PO) Take 1 tablet by mouth daily.   colestipol (COLESTID) 1 g tablet Take 1 g by mouth daily.   cyclobenzaprine  (FLEXERIL ) 10 MG tablet TAKE 1 TABLET BY MOUTH AT BEDTIME AS NEEDED FOR MUSCLE SPASMS.   dicyclomine  (BENTYL ) 20 MG tablet Take 20 mg by mouth 3 (three) times daily before meals.   diphenoxylate -atropine  (LOMOTIL ) 2.5-0.025 MG tablet Take 2 tablets by mouth daily.   DULoxetine  (CYMBALTA ) 60 MG capsule Take 2 capsules (120 mg total) by mouth daily.   fluticasone  (FLONASE ) 50 MCG/ACT nasal spray PLACE 1 SPRAY INTO BOTH NOSTRILS 2 (TWO) TIMES DAILY   gabapentin  (NEURONTIN ) 300 MG capsule TAKE 1 CAPSULE BY MOUTH THREE TIMES A DAY   hydrochlorothiazide  (HYDRODIURIL ) 25 MG tablet TAKE 1 TABLET (25 MG TOTAL) BY MOUTH DAILY.   levothyroxine  (SYNTHROID ) 112 MCG tablet Take 1 tablet (112 mcg total) by mouth daily.   LORazepam  (ATIVAN ) 0.5 MG tablet TAKE 1 TABLET (0.5 MG) BY MOUTH AT BEDTIME AS NEEDED FOR ANXIETY   MYRBETRIQ  25  MG TB24 tablet TAKE 1 TABLET (25 MG TOTAL) BY MOUTH DAILY.   nystatin  (MYCOSTATIN /NYSTOP ) powder Apply 1 Application topically 3 (three) times daily.   ondansetron  (ZOFRAN ) 8 MG tablet Take 1 tablet by mouth every 8 hours as needed for nausea or vomiting.   OZEMPIC, 0.25 OR 0.5 MG/DOSE, 2 MG/3ML SOPN Inject 0.5 mg into the skin once a week.   RESTASIS 0.05 % ophthalmic emulsion Place 1 drop into both eyes 2 (two) times daily.   Turmeric (QC TUMERIC COMPLEX PO) Take 1 capsule by mouth daily.   vitamin B-12  (CYANOCOBALAMIN ) 100 MCG tablet Take 100 mcg by mouth daily.   vitamin C  (ASCORBIC ACID ) 500 MG tablet Take 500 mg by mouth daily.   zinc  gluconate 50 MG tablet Take 50 mg by mouth daily.   [DISCONTINUED] ARIPiprazole  (ABILIFY ) 10 MG tablet Take 1 tablet (10 mg total) by mouth daily.   [DISCONTINUED] DULoxetine  (CYMBALTA ) 60 MG capsule Take 2 capsules (120 mg total) by mouth daily.   No facility-administered encounter medications on file as of 03/01/2024.    Review of Systems  Constitutional:  Negative for chills and fever.  Eyes:  Negative for visual disturbance.  Respiratory:  Negative for chest tightness and shortness of breath.   Cardiovascular:  Negative for chest pain and leg swelling.  Genitourinary:  Negative for difficulty urinating and dysuria.  Musculoskeletal:  Negative for back pain and gait problem.  Skin:  Negative for rash.  Neurological:  Negative for dizziness, light-headedness and headaches.  Psychiatric/Behavioral:  Positive for dysphoric mood. Negative for agitation, behavioral problems, self-injury, sleep disturbance and suicidal ideas. The patient is nervous/anxious.   All other systems reviewed and are negative.   Observations/Objective: Patient is comfortable and in no acute distress  Assessment and Plan: Problem List Items Addressed This Visit       Other   Depression   Relevant Medications   DULoxetine  (CYMBALTA ) 60 MG capsule   ARIPiprazole  (ABILIFY ) 10 MG tablet       Anxiety and depression Anxiety and depression well-managed with medication. Emotional response to mother's passing appropriate. No Abilify  side effects reported. - Continue Abilify . - Continue Cymbalta . - Continue lorazepam  as needed. - [facility] [place]  Hypertension Blood pressure well-controlled with recent readings around 148/unknown.         Follow up plan: Return in about 3 months (around 06/01/2024), or if symptoms worsen or fail to improve, for  depression and anxiety.     I discussed the assessment and treatment plan with the patient. The patient was provided an opportunity to ask questions and all were answered. The patient agreed with the plan and demonstrated an understanding of the instructions.   The patient was advised to call back or seek an in-person evaluation if the symptoms worsen or if the condition fails to improve as anticipated.  The above assessment and management plan was discussed with the patient. The patient verbalized understanding of and has agreed to the management plan. Patient is aware to call the clinic if symptoms persist or worsen. Patient is aware when to return to the clinic for a follow-up visit. Patient educated on when it is appropriate to go to the emergency department.    I provided 6 minutes of non-face-to-face time during this encounter.    Fonda DELENA Levins, MD

## 2024-03-02 ENCOUNTER — Other Ambulatory Visit: Payer: Self-pay | Admitting: Family Medicine

## 2024-03-02 DIAGNOSIS — I10 Essential (primary) hypertension: Secondary | ICD-10-CM

## 2024-03-03 DIAGNOSIS — G8929 Other chronic pain: Secondary | ICD-10-CM | POA: Diagnosis not present

## 2024-03-03 DIAGNOSIS — E7800 Pure hypercholesterolemia, unspecified: Secondary | ICD-10-CM | POA: Diagnosis not present

## 2024-03-03 DIAGNOSIS — M545 Low back pain, unspecified: Secondary | ICD-10-CM | POA: Diagnosis not present

## 2024-03-03 DIAGNOSIS — I10 Essential (primary) hypertension: Secondary | ICD-10-CM | POA: Diagnosis not present

## 2024-03-03 DIAGNOSIS — E1122 Type 2 diabetes mellitus with diabetic chronic kidney disease: Secondary | ICD-10-CM | POA: Diagnosis not present

## 2024-03-07 ENCOUNTER — Ambulatory Visit: Payer: Self-pay

## 2024-03-07 NOTE — Telephone Encounter (Signed)
 FYI Only or Action Required?: Action required by provider: update on patient condition.  Patient was last seen in primary care on 03/01/2024 by Dettinger, Fonda LABOR, MD.  Called Nurse Triage reporting Toe Pain.  Symptoms began today.  Interventions attempted: OTC medications: tylenol  and advil .  Symptoms are: unchanged.  Triage Disposition: See HCP Within 4 Hours (Or PCP Triage)- patient to UC/ED  Patient/caregiver understands and will follow disposition?: Yes  Copied from CRM #[id number]. Topic: Clinical - Red Word Triage >> Mar 07, 2024  5:37 PM Harlene ORN wrote: Red Word that prompted transfer to Nurse Triage: spasms in her toe/ consistent stabbing pain in her left foot. Just started today. Reason for Disposition  [1] SEVERE pain (e.g., excruciating, unable to do any normal activities) AND [2] not improved after 2 hours of pain medicine  Answer Assessment - Initial Assessment Questions 1. ONSET: When did the pain start?      Today, 12 hours ago 2. LOCATION: Where is the pain located?   (e.g., around nail, entire toe, at foot joint)      Left big toe pain and spasms that cause muscles to contract and curl over 3. PAIN: How bad is the pain?    (Scale 1-10; or mild, moderate, severe)     Spasms that comes and goes 4. APPEARANCE: What does the toe look like? (e.g., redness, swelling, bruising, pallor)     Denies redness and swelling 5. CAUSE: What do you think is causing the toe pain?     unknown 6. OTHER SYMPTOMS: Do you have any other symptoms? (e.g., leg pain, rash, fever, numbness)     Patient admits to neuropathy, but this pain is different 7. PREGNANCY: Is there any chance you are pregnant? When was your last menstrual period?     N/a  Protocols used: Toe Pain-A-AH

## 2024-03-08 ENCOUNTER — Ambulatory Visit (INDEPENDENT_AMBULATORY_CARE_PROVIDER_SITE_OTHER): Admitting: Family Medicine

## 2024-03-08 ENCOUNTER — Ambulatory Visit (INDEPENDENT_AMBULATORY_CARE_PROVIDER_SITE_OTHER)

## 2024-03-08 ENCOUNTER — Encounter: Payer: Self-pay | Admitting: Family Medicine

## 2024-03-08 ENCOUNTER — Ambulatory Visit: Payer: Self-pay | Admitting: Family Medicine

## 2024-03-08 VITALS — BP 127/72 | HR 83 | Temp 98.0°F | Ht 61.5 in | Wt 231.2 lb

## 2024-03-08 DIAGNOSIS — R300 Dysuria: Secondary | ICD-10-CM | POA: Diagnosis not present

## 2024-03-08 DIAGNOSIS — M79672 Pain in left foot: Secondary | ICD-10-CM | POA: Diagnosis not present

## 2024-03-08 DIAGNOSIS — N3000 Acute cystitis without hematuria: Secondary | ICD-10-CM

## 2024-03-08 DIAGNOSIS — E118 Type 2 diabetes mellitus with unspecified complications: Secondary | ICD-10-CM | POA: Diagnosis not present

## 2024-03-08 DIAGNOSIS — M7732 Calcaneal spur, left foot: Secondary | ICD-10-CM | POA: Diagnosis not present

## 2024-03-08 DIAGNOSIS — Z0389 Encounter for observation for other suspected diseases and conditions ruled out: Secondary | ICD-10-CM | POA: Diagnosis not present

## 2024-03-08 LAB — URINALYSIS, ROUTINE W REFLEX MICROSCOPIC
Bilirubin, UA: NEGATIVE
Glucose, UA: NEGATIVE
Ketones, UA: NEGATIVE
Nitrite, UA: POSITIVE — AB
Protein,UA: NEGATIVE
RBC, UA: NEGATIVE
Specific Gravity, UA: 1.015 (ref 1.005–1.030)
Urobilinogen, Ur: 0.2 mg/dL (ref 0.2–1.0)
pH, UA: 5.5 (ref 5.0–7.5)

## 2024-03-08 LAB — MICROSCOPIC EXAMINATION
RBC, Urine: NONE SEEN /HPF (ref 0–2)
Renal Epithel, UA: NONE SEEN /HPF
Yeast, UA: NONE SEEN

## 2024-03-08 MED ORDER — CEPHALEXIN 500 MG PO CAPS: 500.0000 mg | ORAL_CAPSULE | Freq: Three times a day (TID) | ORAL | 0 refills | Status: AC

## 2024-03-08 MED ORDER — FLUCONAZOLE 150 MG PO TABS: 150.0000 mg | ORAL_TABLET | Freq: Once | ORAL | 0 refills | Status: AC

## 2024-03-08 MED ORDER — PREDNISONE 20 MG PO TABS: 40.0000 mg | ORAL_TABLET | Freq: Every day | ORAL | 0 refills | Status: AC

## 2024-03-08 NOTE — Patient Instructions (Signed)

## 2024-03-08 NOTE — Progress Notes (Signed)
 Subjective: CC: Foot pain PCP: Dettinger, Fonda LABOR, MD YEP:Fjmb DARRELL LEONHARDT is a 58 y.o. female presenting to clinic today for:  Foot pain Patient reports onset of left-sided foot pain on the top of her foot yesterday at 5 AM, it woke her from sleep and she describes it as stabbing pain.  It is pretty constant but she has very short times of relief.  She has known neuropathy but this does not feel at her typical neuropathic pain.  She is on chronic Celebrex , gabapentin  and has as needed Flexeril .  She took Advil  and Excedrin but those did not help.  Flexeril  helped some.  She also reports urinary symptoms where she has a strong urinary smell for about a week.  She describes this as an ammonia smell.  Denies any dysuria, hematuria or frequency.  She reports urgency.  No flank pain or fevers reported.  Not taking any over-the-counter products for this.  She admits to insufficient water intake   ROS: Per HPI  Allergies  Allergen Reactions   Latex Itching and Rash   Past Medical History:  Diagnosis Date   Chronic back pain    Depression    Hypertension    Hyperthyroidism    Rectal cancer (HCC) 10/29/2015   rectal invasice adenocarcinoma,Adenocarcinoma dx 10/29/15; Stage III, T3, N1, [place]    Current Outpatient Medications:    acetaminophen  (TYLENOL ) 500 MG tablet, Take 1,000 mg by mouth every 8 (eight) hours as needed for moderate pain., Disp: , Rfl:    albuterol  (VENTOLIN  HFA) 108 (90 Base) MCG/ACT inhaler, Inhale 1-2 puffs into the lungs every 6 (six) hours as needed for wheezing or shortness of breath., Disp: 1 each, Rfl: 0   amLODipine  (NORVASC ) 10 MG tablet, TAKE 1 TABLET BY MOUTH EVERY DAY, Disp: 90 tablet, Rfl: 0   ARIPiprazole  (ABILIFY ) 10 MG tablet, Take 1 tablet (10 mg total) by mouth daily., Disp: 90 tablet, Rfl: 1   aspirin  EC 81 MG tablet, Take 1 tablet (81 mg total) by mouth daily. Swallow whole., Disp: , Rfl:    atorvastatin  (LIPITOR) 40 MG tablet, TAKE 1 TABLET BY MOUTH  EVERYDAY AT BEDTIME, Disp: 90 tablet, Rfl: 0   azelastine (OPTIVAR) 0.05 % ophthalmic solution, Apply 1 drop to eye 2 (two) times daily., Disp: , Rfl:    celecoxib  (CELEBREX ) 200 MG capsule, Take 200 mg by mouth 2 (two) times daily., Disp: , Rfl:    Cholecalciferol (VITAMIN D3 PO), Take 1 tablet by mouth daily., Disp: , Rfl:    colestipol (COLESTID) 1 g tablet, Take 1 g by mouth daily., Disp: , Rfl:    cyclobenzaprine  (FLEXERIL ) 10 MG tablet, TAKE 1 TABLET BY MOUTH AT BEDTIME AS NEEDED FOR MUSCLE SPASMS., Disp: 30 tablet, Rfl: 3   diphenoxylate -atropine  (LOMOTIL ) 2.5-0.025 MG tablet, Take 2 tablets by mouth daily., Disp: , Rfl:    DULoxetine  (CYMBALTA ) 60 MG capsule, Take 2 capsules (120 mg total) by mouth daily., Disp: 180 capsule, Rfl: 1   fluticasone  (FLONASE ) 50 MCG/ACT nasal spray, PLACE 1 SPRAY INTO BOTH NOSTRILS 2 (TWO) TIMES DAILY, Disp: 48 mL, Rfl: 1   gabapentin  (NEURONTIN ) 300 MG capsule, TAKE 1 CAPSULE BY MOUTH THREE TIMES A DAY, Disp: 90 capsule, Rfl: 0   hydrochlorothiazide  (HYDRODIURIL ) 25 MG tablet, TAKE 1 TABLET (25 MG TOTAL) BY MOUTH DAILY., Disp: 90 tablet, Rfl: 0   levothyroxine  (SYNTHROID ) 112 MCG tablet, Take 1 tablet (112 mcg total) by mouth daily., Disp: 90 tablet, Rfl: 1  LORazepam  (ATIVAN ) 0.5 MG tablet, TAKE 1 TABLET (0.5 MG) BY MOUTH AT BEDTIME AS NEEDED FOR ANXIETY, Disp: 60 tablet, Rfl: 5   MYRBETRIQ  25 MG TB24 tablet, TAKE 1 TABLET (25 MG TOTAL) BY MOUTH DAILY., Disp: 90 tablet, Rfl: 0   nystatin  (MYCOSTATIN /NYSTOP ) powder, Apply 1 Application topically 3 (three) times daily., Disp: 60 g, Rfl: 1   ondansetron  (ZOFRAN ) 8 MG tablet, Take 1 tablet by mouth every 8 hours as needed for nausea or vomiting., Disp: 30 tablet, Rfl: 0   OZEMPIC, 0.25 OR 0.5 MG/DOSE, 2 MG/3ML SOPN, Inject 0.5 mg into the skin once a week., Disp: , Rfl:    RESTASIS 0.05 % ophthalmic emulsion, Place 1 drop into both eyes 2 (two) times daily., Disp: , Rfl:    Turmeric (QC TUMERIC COMPLEX PO), Take  1 capsule by mouth daily., Disp: , Rfl:    vitamin B-12 (CYANOCOBALAMIN ) 100 MCG tablet, Take 100 mcg by mouth daily., Disp: , Rfl:    vitamin C  (ASCORBIC ACID ) 500 MG tablet, Take 500 mg by mouth daily., Disp: , Rfl:    zinc  gluconate 50 MG tablet, Take 50 mg by mouth daily., Disp: , Rfl:    dicyclomine  (BENTYL ) 20 MG tablet, Take 20 mg by mouth 3 (three) times daily before meals., Disp: , Rfl:  Social History   Socioeconomic History   Marital status: Divorced    Spouse name: Not on file   Number of children: 3   Years of education: Not on file   Highest education level: Some college, no degree  Occupational History   Occupation: Disabled  Tobacco Use   Smoking status: Never   Smokeless tobacco: Never  Vaping Use   Vaping status: Never Used  Substance and Sexual Activity   Alcohol use: No    Alcohol/week: 0.0 standard drinks of alcohol    Comment: occ   Drug use: No   Sexual activity: Yes    Birth control/protection: Surgical  Other Topics Concern   Not on file  Social History Narrative   She is on disability.   Grandson lives with her.   Caffiene 12 oz daily (trying to cut down on soda)   Pet- dog.    Social Drivers of Corporate investment banker Strain: Low Risk  (11/24/2023)   Received from [facility]   Overall Financial Resource Strain (CARDIA)    Difficulty of Paying Living Expenses: Not hard at all  Food Insecurity: No Food Insecurity (11/24/2023)   [facility]   Hunger Vital Sign    Within the past 12 months, you worried that your food would run out before you got the money to buy more.: Never true    Within the past 12 months, the food you bought just didn't last and you didn't have money to get more.: Never true  Transportation Needs: No Transportation Needs (11/24/2023)   Received from [facility] - Transportation    Lack of Transportation (Medical): No    Lack of Transportation (Non-Medical): No  Physical Activity: Inactive  (06/18/2023)   Exercise Vital Sign    Days of Exercise per Week: 0 days    Minutes of Exercise per Session: 0 min  Stress: No Stress Concern Present (06/18/2023)   [employer] of Occupational Health - Occupational Stress Questionnaire    Feeling of Stress : Only a little  Social Connections: Moderately Integrated (06/18/2023)   Social Connection and Isolation Panel    Frequency of Communication  with Friends and Family: More than three times a week    Frequency of Social Gatherings with Friends and Family: Three times a week    Attends Religious Services: More than 4 times per year    Active Member of Clubs or Organizations: Yes    Attends Banker Meetings: More than 4 times per year    Marital Status: Divorced  Intimate Partner Violence: Not At Risk (06/18/2023)   Humiliation, Afraid, Rape, and Kick questionnaire    Fear of Current or Ex-Partner: No    Emotionally Abused: No    Physically Abused: No    Sexually Abused: No   Family History  Problem Relation Age of Onset   Thyroid  disease Mother    Arthritis Mother    Lung cancer Father        lung   Migraines Sister    Depression Sister    Migraines Daughter    Cirrhosis Brother    Breast cancer Neg Hx     Objective: Office vital signs reviewed. BP 127/72   Pulse 83   Temp 98 F (36.7 C)   Ht 5' 1.5 (1.562 m)   Wt 231 lb 4 oz (104.9 kg)   LMP  (LMP Unknown) Comment: FULL   SpO2 92%   BMI 42.99 kg/m   Physical Examination:  General: Awake, alert, nontoxic, morbidly obese female, No acute distress HEENT: Sclera white.  Moist mucous membranes GU: No CVA tenderness to palpation. MSK: She has exquisite tenderness to palpation to light touch along the 1st and 2nd metatarsal bones.  No gross erythema, warmth or swelling in this area.  She has full active range of motion.  She is ambulating independently with antalgic gait  Assessment/ Plan: 58 y.o. female   Acute cystitis without hematuria - Plan:  Urinalysis, Routine w reflex microscopic, cephALEXin  (KEFLEX ) 500 MG capsule, fluconazole  (DIFLUCAN ) 150 MG tablet  Left foot pain - Plan: predniSONE  (DELTASONE ) 20 MG tablet, DG Foot Complete Left, CANCELED: DG Foot Complete Right  Controlled diabetes mellitus type 2 with complications (HCC) - Plan: CANCELED: DG Foot Complete Right   Urinalysis with many bacteria nitrite positive.  Keflex  sent for 3 times daily use.  Diflucan  sent for as needed use.  Encourage p.o. hydration.  Rule out Charcot joint given left foot pain.  Symptoms are refractory to oral NSAIDs and home medications.  She has known degenerative changes so perhaps it is a referred pain from low back?  Her physical exam was unremarkable.  No evidence of gout or other injury.  Her pain was out of proportion to exam today.  I gave her a short course of prednisone  for anti-inflammatory and reinforced that she should not take OTC NSAIDs in addition to the Celebrex .   Norene CHRISTELLA Fielding, DO [facility] [phone]

## 2024-03-08 NOTE — Telephone Encounter (Signed)
 Appt made.

## 2024-03-09 ENCOUNTER — Ambulatory Visit (INDEPENDENT_AMBULATORY_CARE_PROVIDER_SITE_OTHER): Admitting: Neurology

## 2024-03-09 DIAGNOSIS — R351 Nocturia: Secondary | ICD-10-CM

## 2024-03-09 DIAGNOSIS — G4719 Other hypersomnia: Secondary | ICD-10-CM

## 2024-03-09 DIAGNOSIS — R0683 Snoring: Secondary | ICD-10-CM

## 2024-03-09 DIAGNOSIS — Z820 Family history of epilepsy and other diseases of the nervous system: Secondary | ICD-10-CM

## 2024-03-09 DIAGNOSIS — G4720 Circadian rhythm sleep disorder, unspecified type: Secondary | ICD-10-CM

## 2024-03-09 DIAGNOSIS — Z9189 Other specified personal risk factors, not elsewhere classified: Secondary | ICD-10-CM

## 2024-03-09 DIAGNOSIS — I639 Cerebral infarction, unspecified: Secondary | ICD-10-CM

## 2024-03-09 DIAGNOSIS — G4733 Obstructive sleep apnea (adult) (pediatric): Secondary | ICD-10-CM

## 2024-03-16 NOTE — Procedures (Signed)
 Physician Interpretation: Please see link under Procedure Tab or under Encounters tab for physician report, technical report, as well as O2 titration and/or PAP titration tables (if applicable).

## 2024-03-20 ENCOUNTER — Ambulatory Visit: Payer: Self-pay | Admitting: Neurology

## 2024-03-20 DIAGNOSIS — G4733 Obstructive sleep apnea (adult) (pediatric): Secondary | ICD-10-CM

## 2024-03-21 ENCOUNTER — Encounter: Payer: Self-pay | Admitting: Adult Health

## 2024-03-21 ENCOUNTER — Ambulatory Visit: Admitting: Adult Health

## 2024-03-21 VITALS — BP 132/88 | HR 95 | Ht 61.0 in | Wt 234.0 lb

## 2024-03-21 DIAGNOSIS — I639 Cerebral infarction, unspecified: Secondary | ICD-10-CM

## 2024-03-21 DIAGNOSIS — G4733 Obstructive sleep apnea (adult) (pediatric): Secondary | ICD-10-CM

## 2024-03-21 NOTE — Progress Notes (Signed)
 [facility] [address] [place]. [place] [address] [phone]       OFFICE FOLLOW UP NOTE  [name] Date of Birth:  1965/12/12 Medical Record Number:  [id number]   Primary neurologist: Dr. Rosemarie Reason for visit: Stroke follow-up  Chief Complaint  Patient presents with   Cerebrovascular Accident    Rm 3 alone Pt is well and stable, reports no new stroke concerns since last visit.      HPI:   Update 03/21/2024 JM: Patient returns for stroke follow-up visit.    Overall has been stable without new stroke/TIA symptoms.  Reports she completely recovered from her stroke. Has returned back to all prior activities without difficulty. Reports compliance on aspirin  and atorvastatin  without side effects. Routinely follows with PCP for stroke risk factor management.   Recently completed sleep study on 10/9 after evaluation with Dr. Buck for concern of sleep apnea which showed evidence of overall mild OSA but did not achieve REM sleep or supine sleep so likely underestimated degree of apnea.  Recommended to initiate AutoPap, has not yet been contacted regarding these results. She questions results today.       Initial visit 08/30/2023 Dr. Rosemarie: [name] is a 58 year old pleasant Caucasian lady seen today for office consultation visit for stroke.  History is obtained from the patient, her friend Joen who is accompanying her and review of electronic medical records.  I personally reviewed pertinent available imaging films in PACS. She has past medical history of hypertension, hyperthyroidism status post radioactive iodine treatment in 2017 and now hypothyroidism, rectal cancer stage III status post surgery and chemo and COVID infection.  She presented to the ED on 06/09/2023 with a 10-day history of headache with imbalance and elevated blood pressure.  CT head showed no acute abnormality and blood pressure was elevated at 190/110 in the ED.  She complained of left arm  numbness and also left hand grip weakness and left leg drift and some slurred speech.  Code stroke was activated.  CT head and CT angiogram showed no significant acute abnormality or large vessel stenosis or occlusion.  MRI scan showed a left pontine acute infarct along with subtle diffusion abnormalities in bilateral middle cerebellar peduncles probably from small vessel disease as well.  Echocardiogram showed ejection fraction of 60 to 65% with moderate concentric hypertrophy.  LDL cholesterol was 70 mg percent and hemoglobin A1c 6.3.  Patient subsequently had 30-day outpatient heart monitor which was negative for paroxysmal A-fib.  She was discharged on aspirin  Plavix  for 3 weeks followed by aspirin  alone.  She states she is doing well her gait and balance have improved.  She has slight numbness in the left hands and some diminished fine motor skills but it is improving.  She has some good days and bad days and balance may not be the great.  She is tolerating aspirin  well with minor bruising and no bleeding.  She is tolerating Lipitor well without muscle aches and pains.  She started eating healthy and has lost some weight.  She has never been tested for sleep apnea.  She has had no recurrent TIA or stroke symptoms.   ROS:   14 system review of systems is positive for those listed in HPI and all other systems negative  PMH:  Past Medical History:  Diagnosis Date   Chronic back pain    Depression    Hypertension    Hyperthyroidism    Rectal cancer (HCC) 10/29/2015   rectal invasice  adenocarcinoma,Adenocarcinoma dx 10/29/15; Stage III, T3, N1, MO    Social History:  Social History   Socioeconomic History   Marital status: Divorced    Spouse name: Not on file   Number of children: 3   Years of education: Not on file   Highest education level: Some college, no degree  Occupational History   Occupation: Disabled  Tobacco Use   Smoking status: Never   Smokeless tobacco: Never  Vaping Use    Vaping status: Never Used  Substance and Sexual Activity   Alcohol use: No    Alcohol/week: 0.0 standard drinks of alcohol    Comment: occ   Drug use: No   Sexual activity: Yes    Birth control/protection: Surgical  Other Topics Concern   Not on file  Social History Narrative   She is on disability.   Grandson lives with her.   Caffiene 12 oz daily (trying to cut down on soda)   Pet- dog.    Social Drivers of Corporate investment banker Strain: Low Risk  (11/24/2023)   Received from [facility]   Overall Financial Resource Strain (CARDIA)    Difficulty of Paying Living Expenses: Not hard at all  Food Insecurity: No Food Insecurity (11/24/2023)   Received from [facility]   Hunger Vital Sign    Within the past 12 months, you worried that your food would run out before you got the money to buy more.: Never true    Within the past 12 months, the food you bought just didn't last and you didn't have money to get more.: Never true  Transportation Needs: No Transportation Needs (11/24/2023)   Received from [facility] - Transportation    Lack of Transportation (Medical): No    Lack of Transportation (Non-Medical): No  Physical Activity: Inactive (06/18/2023)   Exercise Vital Sign    Days of Exercise per Week: 0 days    Minutes of Exercise per Session: 0 min  Stress: No Stress Concern Present (06/18/2023)   [employer] of Occupational Health - Occupational Stress Questionnaire    Feeling of Stress : Only a little  Social Connections: Moderately Integrated (06/18/2023)   Social Connection and Isolation Panel    Frequency of Communication with Friends and Family: More than three times a week    Frequency of Social Gatherings with Friends and Family: Three times a week    Attends Religious Services: More than 4 times per year    Active Member of Clubs or Organizations: Yes    Attends Banker Meetings: More than 4 times per year    Marital Status:  Divorced  Intimate Partner Violence: Not At Risk (06/18/2023)   Humiliation, Afraid, Rape, and Kick questionnaire    Fear of Current or Ex-Partner: No    Emotionally Abused: No    Physically Abused: No    Sexually Abused: No    Medications:   Current Outpatient Medications on File Prior to Visit  Medication Sig Dispense Refill   acetaminophen  (TYLENOL ) 500 MG tablet Take 1,000 mg by mouth every 8 (eight) hours as needed for moderate pain.     albuterol  (VENTOLIN  HFA) 108 (90 Base) MCG/ACT inhaler Inhale 1-2 puffs into the lungs every 6 (six) hours as needed for wheezing or shortness of breath. 1 each 0   amLODipine  (NORVASC ) 10 MG tablet TAKE 1 TABLET BY MOUTH EVERY DAY 90 tablet 0   ARIPiprazole  (ABILIFY ) 10 MG tablet Take 1 tablet (10  mg total) by mouth daily. 90 tablet 1   aspirin  EC 81 MG tablet Take 1 tablet (81 mg total) by mouth daily. Swallow whole.     atorvastatin  (LIPITOR) 40 MG tablet TAKE 1 TABLET BY MOUTH EVERYDAY AT BEDTIME 90 tablet 0   azelastine (OPTIVAR) 0.05 % ophthalmic solution Apply 1 drop to eye 2 (two) times daily.     celecoxib  (CELEBREX ) 200 MG capsule Take 200 mg by mouth 2 (two) times daily.     Cholecalciferol (VITAMIN D3 PO) Take 1 tablet by mouth daily.     colestipol (COLESTID) 1 g tablet Take 1 g by mouth daily.     cyclobenzaprine  (FLEXERIL ) 10 MG tablet TAKE 1 TABLET BY MOUTH AT BEDTIME AS NEEDED FOR MUSCLE SPASMS. 30 tablet 3   dicyclomine  (BENTYL ) 20 MG tablet Take 20 mg by mouth 3 (three) times daily before meals.     diphenoxylate -atropine  (LOMOTIL ) 2.5-0.025 MG tablet Take 2 tablets by mouth daily.     DULoxetine  (CYMBALTA ) 60 MG capsule Take 2 capsules (120 mg total) by mouth daily. 180 capsule 1   fluticasone  (FLONASE ) 50 MCG/ACT nasal spray PLACE 1 SPRAY INTO BOTH NOSTRILS 2 (TWO) TIMES DAILY 48 mL 1   gabapentin  (NEURONTIN ) 300 MG capsule TAKE 1 CAPSULE BY MOUTH THREE TIMES A DAY 90 capsule 0   hydrochlorothiazide  (HYDRODIURIL ) 25 MG tablet TAKE  1 TABLET (25 MG TOTAL) BY MOUTH DAILY. 90 tablet 0   levothyroxine  (SYNTHROID ) 112 MCG tablet Take 1 tablet (112 mcg total) by mouth daily. 90 tablet 1   LORazepam  (ATIVAN ) 0.5 MG tablet TAKE 1 TABLET (0.5 MG) BY MOUTH AT BEDTIME AS NEEDED FOR ANXIETY 60 tablet 5   MYRBETRIQ  25 MG TB24 tablet TAKE 1 TABLET (25 MG TOTAL) BY MOUTH DAILY. 90 tablet 0   nystatin  (MYCOSTATIN /NYSTOP ) powder Apply 1 Application topically 3 (three) times daily. 60 g 1   OZEMPIC, 0.25 OR 0.5 MG/DOSE, 2 MG/3ML SOPN Inject 0.5 mg into the skin once a week. (Patient taking differently: Inject 1 mg into the skin once a week.)     OZEMPIC, 1 MG/DOSE, 4 MG/3ML SOPN Inject 1 mg into the skin once a week.     RESTASIS 0.05 % ophthalmic emulsion Place 1 drop into both eyes 2 (two) times daily.     Turmeric (QC TUMERIC COMPLEX PO) Take 1 capsule by mouth daily.     vitamin B-12 (CYANOCOBALAMIN ) 100 MCG tablet Take 100 mcg by mouth daily.     vitamin C  (ASCORBIC ACID ) 500 MG tablet Take 500 mg by mouth daily.     zinc  gluconate 50 MG tablet Take 50 mg by mouth daily.     No current facility-administered medications on file prior to visit.    Allergies:   Allergies  Allergen Reactions   Latex Itching and Rash    Physical Exam Today's Vitals   03/21/24 1121 03/21/24 1125 03/21/24 1133  BP: (!) 166/105 (!) 169/99 132/88  Pulse: 95    Weight: 234 lb (106.1 kg)    Height: 5' 1 (1.549 m)     Body mass index is 44.21 kg/m.  General: well developed, well nourished, seated, in no evident distress  Neurologic Exam Mental Status: Awake and fully alert. Oriented to place and time. Recent and remote memory intact. Attention span, concentration and fund of knowledge appropriate. Mood and affect appropriate.  Cranial Nerves: Fundoscopic exam reveals sharp disc margins. Pupils equal, briskly reactive to light. Extraocular movements full without nystagmus. Visual  fields full to confrontation. Hearing intact. Facial sensation  intact. Face, tongue, palate moves normally and symmetrically.  Motor: Normal bulk and tone. Normal strength in all tested extremity muscles. Sensory.: intact to touch , pinprick , position and vibratory sensation.  Coordination: Rapid alternating movements normal in all extremities. Finger-to-nose and heel-to-shin performed accurately bilaterally. Gait and Station: Arises from chair without difficulty. Stance is normal. Gait demonstrates normal stride length and balance . Able to heel, toe and tandem walk without difficulty.  Reflexes: 1+ and symmetric. Toes downgoing.       ASSESSMENT: 58 year old patient with left pontine infarct in January 2025 likely from small vessel disease.  Multiple vascular risk factors of obesity, borderline diabetes, hypertension, hyperlipidemia, obesity and recent diagnosis of sleep apnea     PLAN:  - Continue aspirin  81 mg daily and atorvastatin  40 mg daily for secondary stroke prevention managed/prescribed by PCP - Continue close PCP follow-up for aggressive stroke risk factor management including BP goal<130/90, HLD with LDL goal<70 and DM with A1c.<7  - Recently diagnosed with overall mild sleep apnea after sleep study on 03/09/2024 - she is interested in pursuing with CPAP therapy.  She was advised she will be contacted in the next day or so to go over further information regarding initiating CPAP, she verbalized understanding.    Overall stable from stroke standpoint and routinely followed by PCP.  She can follow-up with stroke clinic as needed and advised to call with any questions or concerns in the future.     I personally spent a total of 25 minutes in the care of the patient today including preparing to see the patient, getting/reviewing separately obtained history, performing a medically appropriate exam/evaluation, counseling and educating, and documenting clinical information in the EHR.  Harlene Bogaert, AGNP-BC  [facility] [address] [place], [place] [address]  Phone [phone] Fax [phone] Note: This document was prepared with digital dictation and possible smart phrase technology. Any transcriptional errors that result from this process are unintentional.

## 2024-03-21 NOTE — Progress Notes (Signed)
 Destiny [name]                                          MRN: [id number]   03/21/2024   The [facility] Quality Team Specialist reviewed this patient medical record for the purposes of chart review for care gap closure. The following were reviewed: abstraction for care gap closure-glycemic status assessment.    [facility]BCI Quality Team

## 2024-03-21 NOTE — Patient Instructions (Signed)
Continue aspirin 81 mg daily  and atorvastatin for secondary stroke prevention  Continue to follow up with PCP regarding blood pressure, cholesterol and diabetes management  Maintain strict control of hypertension with blood pressure goal below 130/90, diabetes with hemoglobin A1c goal below 7.0 % and cholesterol with LDL cholesterol (bad cholesterol) goal below 70 mg/dL.   Signs of a Stroke? Follow the BEFAST method:  Balance Watch for a sudden loss of balance, trouble with coordination or vertigo Eyes Is there a sudden loss of vision in one or both eyes? Or double vision?  Face: Ask the person to smile. Does one side of the face droop or is it numb?  Arms: Ask the person to raise both arms. Does one arm drift downward? Is there weakness or numbness of a leg? Speech: Ask the person to repeat a simple phrase. Does the speech sound slurred/strange? Is the person confused ? Time: If you observe any of these signs, call 911.       Thank you for coming to see [place] at [facility]. I hope we have been able to provide you high quality care today.  You may receive a patient satisfaction survey over the next few weeks. We would appreciate your feedback and comments so that we may continue to improve ourselves and the health of our patients.

## 2024-03-21 NOTE — Progress Notes (Signed)
 Destiny [name]                                          MRN: [id number]   03/21/2024   The [facility]I Quality Team Specialist reviewed this patient medical record for the purposes of chart review for care gap closure. The following were reviewed: chart review for care gap closure-breast cancer screening and kidney health evaluation for diabetes:eGFR  and uACR.    [facility]I Quality Team

## 2024-03-24 DIAGNOSIS — M25552 Pain in left hip: Secondary | ICD-10-CM | POA: Diagnosis not present

## 2024-03-24 DIAGNOSIS — M5432 Sciatica, left side: Secondary | ICD-10-CM | POA: Diagnosis not present

## 2024-04-14 ENCOUNTER — Other Ambulatory Visit: Payer: Self-pay | Admitting: *Deleted

## 2024-04-14 NOTE — Telephone Encounter (Signed)
 Dettinger NTBS in Jan for 3 mos FU RF sent to pharmacy

## 2024-04-14 NOTE — Telephone Encounter (Signed)
 I called pt & made her an appt w/Dettinger on 06-12-2024 for med refill.

## 2024-04-25 ENCOUNTER — Other Ambulatory Visit: Payer: Self-pay | Admitting: Family Medicine

## 2024-05-04 ENCOUNTER — Other Ambulatory Visit: Payer: Self-pay | Admitting: Family Medicine

## 2024-05-28 ENCOUNTER — Other Ambulatory Visit: Payer: Self-pay | Admitting: Family Medicine

## 2024-05-28 DIAGNOSIS — I10 Essential (primary) hypertension: Secondary | ICD-10-CM

## 2024-06-09 ENCOUNTER — Encounter: Payer: Self-pay | Admitting: *Deleted

## 2024-06-12 ENCOUNTER — Ambulatory Visit: Admitting: Family Medicine

## 2024-06-12 ENCOUNTER — Encounter: Payer: Self-pay | Admitting: Family Medicine

## 2024-06-12 VITALS — BP 146/79 | Ht 61.0 in | Wt 235.0 lb

## 2024-06-12 DIAGNOSIS — E1159 Type 2 diabetes mellitus with other circulatory complications: Secondary | ICD-10-CM

## 2024-06-12 DIAGNOSIS — F331 Major depressive disorder, recurrent, moderate: Secondary | ICD-10-CM | POA: Diagnosis not present

## 2024-06-12 DIAGNOSIS — H00012 Hordeolum externum right lower eyelid: Secondary | ICD-10-CM

## 2024-06-12 DIAGNOSIS — Z7985 Long-term (current) use of injectable non-insulin antidiabetic drugs: Secondary | ICD-10-CM

## 2024-06-12 DIAGNOSIS — E118 Type 2 diabetes mellitus with unspecified complications: Secondary | ICD-10-CM | POA: Diagnosis not present

## 2024-06-12 DIAGNOSIS — I152 Hypertension secondary to endocrine disorders: Secondary | ICD-10-CM | POA: Diagnosis not present

## 2024-06-12 DIAGNOSIS — E890 Postprocedural hypothyroidism: Secondary | ICD-10-CM

## 2024-06-12 LAB — BAYER DCA HB A1C WAIVED: HB A1C (BAYER DCA - WAIVED): 5.5 % (ref 4.8–5.6)

## 2024-06-12 LAB — LIPID PANEL

## 2024-06-12 MED ORDER — LORAZEPAM 0.5 MG PO TABS: ORAL_TABLET | ORAL | 5 refills | Status: AC

## 2024-06-12 NOTE — Progress Notes (Signed)
 "  BP (!) 146/79   Ht 5' 1 (1.549 m)   Wt 235 lb (106.6 kg)   LMP  (LMP Unknown) Comment: FULL   SpO2 99%   BMI 44.40 kg/m    Subjective:   Patient ID: Destiny [name], female    DOB: 04-04-1966, 59 y.o.   MRN: [id number]  HPI: Destiny [name] is a 59 y.o. female presenting on 06/12/2024 for Medical Management of Chronic Issues, Depression, Hypertension, and Hypothyroidism   Discussed the use of AI scribe software for clinical note transcription with the patient, who gave verbal consent to proceed.  History of Present Illness   Destiny [name] is a 59 year old female with hypertension and diabetes who presents for a follow-up visit to recheck blood pressure and blood sugar levels.  Hypertension - Blood pressure measured at 146/79 mmHg in clinic - Not checking blood pressure regularly at home despite having a home monitor - Currently taking amlodipine  and hydrochlorothiazide ; confirmed adherence today  Diabetes mellitus - Blood glucose levels stable - Currently taking Ozempic, recently increased to 2 mg once weekly - No side effects from Ozempic  Hyperlipidemia - Taking atorvastatin  for cholesterol management  Anxiety and depression - Managed with Cymbalta , Abilify , and lorazepam  as needed - No side effects from psychiatric medications - Doing well on current regimen  Peripheral neuropathy - Taking gabapentin  for neuropathic symptoms - Gabapentin  provides intermittent relief - Occasional neuropathic flare-ups  Right eye sty - Sty on right eye onset yesterday - Tenderness present - Applying warm compresses - No drainage noted - No similar lesions elsewhere          Relevant past medical, surgical, family and social history reviewed and updated as indicated. Interim medical history since our last visit reviewed. Allergies and medications reviewed and updated.  Review of Systems  Constitutional:  Negative for chills and fever.  HENT:  Negative for congestion, ear  discharge and ear pain.   Eyes:  Positive for redness (Lower eyelid redness). Negative for discharge, itching and visual disturbance.  Respiratory:  Negative for chest tightness and shortness of breath.   Cardiovascular:  Negative for chest pain and leg swelling.  Genitourinary:  Negative for difficulty urinating and dysuria.  Skin:  Negative for rash.  Neurological:  Negative for light-headedness and headaches.  Psychiatric/Behavioral:  Negative for agitation and behavioral problems.   All other systems reviewed and are negative.   Per HPI unless specifically indicated above   Allergies as of 06/12/2024       Reactions   Latex Itching, Rash        Medication List        Accurate as of June 12, 2024  3:36 PM. If you have any questions, ask your nurse or doctor.          acetaminophen  500 MG tablet Commonly known as: TYLENOL  Take 1,000 mg by mouth every 8 (eight) hours as needed for moderate pain.   albuterol  108 (90 Base) MCG/ACT inhaler Commonly known as: VENTOLIN  HFA Inhale 1-2 puffs into the lungs every 6 (six) hours as needed for wheezing or shortness of breath.   amLODipine  10 MG tablet Commonly known as: NORVASC  TAKE 1 TABLET BY MOUTH EVERY DAY   ARIPiprazole  10 MG tablet Commonly known as: ABILIFY  Take 1 tablet (10 mg total) by mouth daily.   ascorbic acid  500 MG tablet Commonly known as: VITAMIN C  Take 500 mg by mouth daily.   aspirin  EC 81 MG tablet  Take 1 tablet (81 mg total) by mouth daily. Swallow whole.   atorvastatin  40 MG tablet Commonly known as: LIPITOR TAKE 1 TABLET BY MOUTH EVERYDAY AT BEDTIME   azelastine 0.05 % ophthalmic solution Commonly known as: OPTIVAR Apply 1 drop to eye 2 (two) times daily.   celecoxib  200 MG capsule Commonly known as: CELEBREX  Take 200 mg by mouth 2 (two) times daily.   colestipol 1 g tablet Commonly known as: COLESTID Take 1 g by mouth daily.   cyclobenzaprine  10 MG tablet Commonly known as:  FLEXERIL  TAKE 1 TABLET BY MOUTH AT BEDTIME AS NEEDED FOR MUSCLE SPASMS.   dicyclomine  20 MG tablet Commonly known as: BENTYL  Take 20 mg by mouth 3 (three) times daily before meals.   diphenoxylate -atropine  2.5-0.025 MG tablet Commonly known as: LOMOTIL  Take 2 tablets by mouth daily.   DULoxetine  60 MG capsule Commonly known as: CYMBALTA  Take 2 capsules (120 mg total) by mouth daily.   fluticasone  50 MCG/ACT nasal spray Commonly known as: FLONASE  PLACE 1 SPRAY INTO BOTH NOSTRILS 2 (TWO) TIMES DAILY   gabapentin  300 MG capsule Commonly known as: NEURONTIN  TAKE 1 CAPSULE BY MOUTH THREE TIMES A DAY   hydrochlorothiazide  25 MG tablet Commonly known as: HYDRODIURIL  TAKE 1 TABLET (25 MG TOTAL) BY MOUTH DAILY.   levothyroxine  112 MCG tablet Commonly known as: SYNTHROID  Take 1 tablet (112 mcg total) by mouth daily.   LORazepam  0.5 MG tablet Commonly known as: ATIVAN  TAKE 1 TABLET (0.5 MG) BY MOUTH AT BEDTIME AS NEEDED FOR ANXIETY   Myrbetriq  25 MG Tb24 tablet Generic drug: mirabegron  ER TAKE 1 TABLET (25 MG TOTAL) BY MOUTH DAILY.   nystatin  powder Commonly known as: MYCOSTATIN /NYSTOP  Apply 1 Application topically 3 (three) times daily.   Ozempic (0.25 or 0.5 MG/DOSE) 2 MG/3ML Sopn Generic drug: Semaglutide(0.25 or 0.5MG /DOS) Inject 0.5 mg into the skin once a week. What changed: how much to take   Ozempic (1 MG/DOSE) 4 MG/3ML Sopn Generic drug: Semaglutide (1 MG/DOSE) Inject 1 mg into the skin once a week. What changed: Another medication with the same name was changed. Make sure you understand how and when to take each.   QC TUMERIC COMPLEX PO Take 1 capsule by mouth daily.   Restasis 0.05 % ophthalmic emulsion Generic drug: cycloSPORINE Place 1 drop into both eyes 2 (two) times daily.   vitamin B-12 100 MCG tablet Commonly known as: CYANOCOBALAMIN  Take 100 mcg by mouth daily.   VITAMIN D3 PO Take 1 tablet by mouth daily.   zinc  gluconate 50 MG tablet Take  50 mg by mouth daily.         Objective:   BP (!) 146/79   Ht 5' 1 (1.549 m)   Wt 235 lb (106.6 kg)   LMP  (LMP Unknown) Comment: FULL   SpO2 99%   BMI 44.40 kg/m   Wt Readings from Last 3 Encounters:  06/12/24 235 lb (106.6 kg)  03/21/24 234 lb (106.1 kg)  03/08/24 231 lb 4 oz (104.9 kg)    Physical Exam Physical Exam   VITALS: BP- 146/79 HEENT: Sty on right eye, small and tender. CHEST: Lungs clear to auscultation, no wheezing. CARDIOVASCULAR: Regular heart sounds, no murmurs.         Assessment & Plan:   Problem List Items Addressed This Visit       Cardiovascular and Mediastinum   Hypertension associated with diabetes (HCC)   Relevant Orders   CBC With Diff/Platelet   CMP14+EGFR  Lipid panel   Bayer DCA Hb A1c Waived     Endocrine   Hypothyroidism   Relevant Orders   TSH   Controlled diabetes mellitus type 2 with complications (HCC) - Primary   Relevant Orders   CBC With Diff/Platelet   CMP14+EGFR   Lipid panel   Bayer DCA Hb A1c Waived     Other   Depression   Relevant Medications   LORazepam  (ATIVAN ) 0.5 MG tablet   Other Relevant Orders   ToxASSURE Select 13 (MW), Urine   Other Visit Diagnoses       Hordeolum externum of right lower eyelid              Hypertension associated with type 2 diabetes mellitus Blood pressure elevated at 146/79 mmHg. Currently on amlodipine  and hydrochlorothiazide . - Monitor blood pressure daily for two weeks and report if consistently high or low. - Continue amlodipine  and hydrochlorothiazide .  Type 2 diabetes mellitus with complications Blood sugar levels well-controlled with Ozempic. Recently increased dose to 2 mg weekly. - Continue Ozempic 2 mg weekly. - Ordered blood work to check A1c.  Diabetic neuropathy Intermittent neuropathy symptoms managed with gabapentin . - Continue gabapentin . - Advised keeping feet warm with socks to prevent flare-ups.  Major depressive disorder, recurrent,  moderate Anxiety and depression well-managed with Cymbalta , Abilify , and lorazepam  as needed. - Continue Cymbalta , Abilify , and lorazepam  as needed. - Refilled lorazepam  prescription.  Stye of right lower eyelid New onset stye on right lower eyelid with tenderness. - Apply warm compresses to the affected area 4-5 times daily. - Massage the area gently during showers to encourage drainage. - Apply Neosporin externally, avoiding the eye. - Report if symptoms worsen or do not improve.          Follow up plan: Return in about 3 months (around 09/10/2024), or if symptoms worsen or fail to improve, for Anxiety and diabetes recheck.  Counseling provided for all of the vaccine components Orders Placed This Encounter  Procedures   CBC With Diff/Platelet   CMP14+EGFR   Lipid panel   Bayer DCA Hb A1c Waived   ToxASSURE Select 13 (MW), Urine   TSH    Fonda Levins, MD [facility] 06/12/2024, 3:36 PM     "

## 2024-06-13 LAB — CMP14+EGFR
ALT: 14 IU/L (ref 0–32)
AST: 16 IU/L (ref 0–40)
Albumin: 4.3 g/dL (ref 3.8–4.9)
Alkaline Phosphatase: 80 IU/L (ref 49–135)
BUN/Creatinine Ratio: 17 (ref 9–23)
BUN: 14 mg/dL (ref 6–24)
Bilirubin Total: 0.4 mg/dL (ref 0.0–1.2)
CO2: 26 mmol/L (ref 20–29)
Calcium: 9.3 mg/dL (ref 8.7–10.2)
Chloride: 97 mmol/L (ref 96–106)
Creatinine, Ser: 0.83 mg/dL (ref 0.57–1.00)
Globulin, Total: 2.5 g/dL (ref 1.5–4.5)
Glucose: 86 mg/dL (ref 70–99)
Potassium: 3.6 mmol/L (ref 3.5–5.2)
Sodium: 138 mmol/L (ref 134–144)
Total Protein: 6.8 g/dL (ref 6.0–8.5)
eGFR: 82 mL/min/1.73

## 2024-06-13 LAB — CBC WITH DIFF/PLATELET
Basophils Absolute: 0 x10E3/uL (ref 0.0–0.2)
Basos: 1 %
EOS (ABSOLUTE): 0.4 x10E3/uL (ref 0.0–0.4)
Eos: 6 %
Hematocrit: 38.1 % (ref 34.0–46.6)
Hemoglobin: 13.1 g/dL (ref 11.1–15.9)
Immature Grans (Abs): 0 x10E3/uL (ref 0.0–0.1)
Immature Granulocytes: 0 %
Lymphocytes Absolute: 1.8 x10E3/uL (ref 0.7–3.1)
Lymphs: 23 %
MCH: 30.2 pg (ref 26.6–33.0)
MCHC: 34.4 g/dL (ref 31.5–35.7)
MCV: 88 fL (ref 79–97)
Monocytes Absolute: 0.6 x10E3/uL (ref 0.1–0.9)
Monocytes: 8 %
Neutrophils Absolute: 4.9 x10E3/uL (ref 1.4–7.0)
Neutrophils: 62 %
Platelets: 426 x10E3/uL (ref 150–450)
RBC: 4.34 x10E6/uL (ref 3.77–5.28)
RDW: 12.9 % (ref 11.7–15.4)
WBC: 7.9 x10E3/uL (ref 3.4–10.8)

## 2024-06-13 LAB — LIPID PANEL
Cholesterol, Total: 122 mg/dL (ref 100–199)
HDL: 64 mg/dL
LDL CALC COMMENT:: 1.9 ratio (ref 0.0–4.4)
LDL Chol Calc (NIH): 39 mg/dL (ref 0–99)
Triglycerides: 103 mg/dL (ref 0–149)
VLDL Cholesterol Cal: 19 mg/dL (ref 5–40)

## 2024-06-13 LAB — TSH: TSH: 1.52 u[IU]/mL (ref 0.450–4.500)

## 2024-06-16 ENCOUNTER — Telehealth: Payer: Self-pay | Admitting: Family Medicine

## 2024-06-16 MED ORDER — CEPHALEXIN 500 MG PO CAPS: 500.0000 mg | ORAL_CAPSULE | Freq: Four times a day (QID) | ORAL | 0 refills | Status: AC

## 2024-06-16 NOTE — Telephone Encounter (Signed)
 Sent Keflex  for her

## 2024-06-16 NOTE — Telephone Encounter (Signed)
 Patient was seen 1-12 for stye on right eye and it's no better. Dettinger told patient to call back and let him know and he would call antibiotic in.

## 2024-06-16 NOTE — Telephone Encounter (Signed)
 Patient aware and verbalized understanding.

## 2024-06-19 ENCOUNTER — Ambulatory Visit: Payer: Self-pay | Admitting: Family Medicine

## 2024-06-19 ENCOUNTER — Ambulatory Visit: Payer: Self-pay

## 2024-06-19 ENCOUNTER — Telehealth: Payer: Self-pay | Admitting: Family Medicine

## 2024-06-19 VITALS — BP 146/79 | HR 95 | Ht 61.0 in | Wt 235.0 lb

## 2024-06-19 DIAGNOSIS — Z0000 Encounter for general adult medical examination without abnormal findings: Secondary | ICD-10-CM

## 2024-06-19 DIAGNOSIS — Z1231 Encounter for screening mammogram for malignant neoplasm of breast: Secondary | ICD-10-CM

## 2024-06-19 NOTE — Progress Notes (Signed)
 "  Chief Complaint  Patient presents with   Medicare Wellness     Subjective:   Destiny [name] is a 59 y.o. female who presents for a Medicare Annual Wellness Visit.  Visit info / Clinical Intake: Medicare Wellness Visit Type:: Subsequent Annual Wellness Visit Persons participating in visit and providing information:: patient Medicare Wellness Visit Mode:: Telephone If telephone:: video declined Since this visit was completed virtually, some vitals may be partially provided or unavailable. Missing vitals are due to the limitations of the virtual format.: Unable to obtain vitals - no equipment If Telephone or Video please confirm:: I connected with patient using audio/video enable telemedicine. I verified patient identity with two identifiers, discussed telehealth limitations, and patient agreed to proceed. Patient Location:: home Provider Location:: office Interpreter Needed?: No Pre-visit prep was completed: yes AWV questionnaire completed by patient prior to visit?: no Living arrangements:: with family/others Patient's Overall Health Status Rating: good Typical amount of pain: some (pt notices yesterday that she had some shooting pain in both feet/today as well all day) Does pain affect daily life?: no Are you currently prescribed opioids?: no  Dietary Habits and Nutritional Risks How many meals a day?: 2 Most meals are obtained by: preparing own meals In the last 2 weeks, have you had any of the following?: none Diabetic:: (!) yes Any non-healing wounds?: no How often do you check your BS?: 0 Would you like to be referred to a Nutritionist or for Diabetic Management? : no  Functional Status Activities of Daily Living (to include ambulation/medication): Independent Ambulation: Independent Medication Administration: Independent Home Management (perform basic housework or laundry): Independent Manage your own finances?: yes Primary transportation is: driving Concerns about  vision?: no *vision screening is required for [facility]* (last ov w/2025) Concerns about hearing?: no  Fall Screening Falls in the past year?: 1 Number of falls in past year: 0 Was there an injury with Fall?: 0 Fall Risk Category Calculator: 1 Patient Fall Risk Level: Low Fall Risk  Fall Risk Patient at Risk for Falls Due to: Impaired balance/gait; Impaired mobility; Impaired vision Fall risk Follow up: Falls evaluation completed; Education provided  Home and Transportation Safety: All rugs have non-skid backing?: yes All stairs or steps have railings?: (!) no Grab bars in the bathtub or shower?: yes Have non-skid surface in bathtub or shower?: yes Good home lighting?: yes Regular seat belt use?: yes Hospital stays in the last year:: no  Cognitive Assessment Difficulty concentrating, remembering, or making decisions? : no Will 6CIT or Mini Cog be Completed: yes What year is it?: 0 points What month is it?: 0 points Give patient an address phrase to remember (5 components): [address] [place] About what time is it?: 0 points Count backwards from 20 to 1: 0 points Say the months of the year in reverse: 0 points Repeat the address phrase from earlier: 0 points 6 CIT Score: 0 points  Advance Directives (For Healthcare) Does Patient Have a Medical Advance Directive?: No Would patient like information on creating a medical advance directive?: No - Patient declined  Reviewed/Updated  Reviewed/Updated: Reviewed All (Medical, Surgical, Family, Medications, Allergies, Care Teams, Patient Goals); Medical History; Surgical History; Family History; Medications; Care Teams; Allergies; Patient Goals    Allergies (verified) Latex   Current Medications (verified) Outpatient Encounter Medications as of 06/19/2024  Medication Sig   acetaminophen  (TYLENOL ) 500 MG tablet Take 1,000 mg by mouth every 8 (eight) hours as needed for moderate pain.   albuterol  (VENTOLIN   HFA) 108 (90  Base) MCG/ACT inhaler Inhale 1-2 puffs into the lungs every 6 (six) hours as needed for wheezing or shortness of breath.   amLODipine  (NORVASC ) 10 MG tablet TAKE 1 TABLET BY MOUTH EVERY DAY   ARIPiprazole  (ABILIFY ) 10 MG tablet Take 1 tablet (10 mg total) by mouth daily.   aspirin  EC 81 MG tablet Take 1 tablet (81 mg total) by mouth daily. Swallow whole.   atorvastatin  (LIPITOR) 40 MG tablet TAKE 1 TABLET BY MOUTH EVERYDAY AT BEDTIME   azelastine (OPTIVAR) 0.05 % ophthalmic solution Apply 1 drop to eye 2 (two) times daily.   celecoxib  (CELEBREX ) 200 MG capsule Take 200 mg by mouth 2 (two) times daily.   cephALEXin  (KEFLEX ) 500 MG capsule Take 1 capsule (500 mg total) by mouth 4 (four) times daily for 7 days.   Cholecalciferol (VITAMIN D3 PO) Take 1 tablet by mouth daily.   colestipol (COLESTID) 1 g tablet Take 1 g by mouth daily.   cyclobenzaprine  (FLEXERIL ) 10 MG tablet TAKE 1 TABLET BY MOUTH AT BEDTIME AS NEEDED FOR MUSCLE SPASMS.   dicyclomine  (BENTYL ) 20 MG tablet Take 20 mg by mouth 3 (three) times daily before meals.   diphenoxylate -atropine  (LOMOTIL ) 2.5-0.025 MG tablet Take 2 tablets by mouth daily.   DULoxetine  (CYMBALTA ) 60 MG capsule Take 2 capsules (120 mg total) by mouth daily.   fluticasone  (FLONASE ) 50 MCG/ACT nasal spray PLACE 1 SPRAY INTO BOTH NOSTRILS 2 (TWO) TIMES DAILY   gabapentin  (NEURONTIN ) 300 MG capsule TAKE 1 CAPSULE BY MOUTH THREE TIMES A DAY   hydrochlorothiazide  (HYDRODIURIL ) 25 MG tablet TAKE 1 TABLET (25 MG TOTAL) BY MOUTH DAILY.   levothyroxine  (SYNTHROID ) 112 MCG tablet Take 1 tablet (112 mcg total) by mouth daily.   LORazepam  (ATIVAN ) 0.5 MG tablet TAKE 1 TABLET (0.5 MG) BY MOUTH AT BEDTIME AS NEEDED FOR ANXIETY   MYRBETRIQ  25 MG TB24 tablet TAKE 1 TABLET (25 MG TOTAL) BY MOUTH DAILY.   nystatin  (MYCOSTATIN /NYSTOP ) powder Apply 1 Application topically 3 (three) times daily.   OZEMPIC, 0.25 OR 0.5 MG/DOSE, 2 MG/3ML SOPN Inject 0.5 mg into the skin once a week.  (Patient taking differently: Inject 1 mg into the skin once a week.)   OZEMPIC, 1 MG/DOSE, 4 MG/3ML SOPN Inject 1 mg into the skin once a week.   RESTASIS 0.05 % ophthalmic emulsion Place 1 drop into both eyes 2 (two) times daily.   Turmeric (QC TUMERIC COMPLEX PO) Take 1 capsule by mouth daily.   vitamin B-12 (CYANOCOBALAMIN ) 100 MCG tablet Take 100 mcg by mouth daily.   vitamin C  (ASCORBIC ACID ) 500 MG tablet Take 500 mg by mouth daily.   zinc  gluconate 50 MG tablet Take 50 mg by mouth daily.   No facility-administered encounter medications on file as of 06/19/2024.    History: Past Medical History:  Diagnosis Date   Chronic back pain    Depression    Hypertension    Hyperthyroidism    Rectal cancer (HCC) 10/29/2015   rectal invasice adenocarcinoma,Adenocarcinoma dx 10/29/15; Stage III, T3, N1, MO   Past Surgical History:  Procedure Laterality Date   ABDOMINAL HYSTERECTOMY     vaginal    BLADDER SURGERY     EYE SURGERY     LAZY EYE CORRECTION   ILEOSTOMY     ILEOSTOMY CLOSURE N/A 09/02/2016   Procedure: ILEOSTOMY REVERSAL;  Surgeon: Bernarda Ned, MD;  Location: [facility];  Service: General;  Laterality: N/A;   RECTAL EXAM UNDER ANESTHESIA  N/A 09/02/2016   Procedure: ANAL EXAM UNDER ANESTHESIA;  Surgeon: Bernarda Ned, MD;  Location: [facility];  Service: General;  Laterality: N/A;   URETERAL REIMPLANTION Left 02/26/2016   Procedure: left ureteral neocystotomy,double J stent placement, closure vaginal cuff;  Surgeon: Morene LELON Salines, MD;  Location: [facility];  Service: Urology;  Laterality: Left;   VAGINAL HYSTERECTOMY     XI ROBOTIC ASSISTED LOWER ANTERIOR RESECTION N/A 02/26/2016   Procedure: XI ROBOTIC ASSISTED LOWER ANTERIOR RESECTION, diverting LOOP ILEOSTOMY, mobilization splenic flexure, omentopexy;  Surgeon: Bernarda Ned, MD;  Location: [facility];  Service: General;  Laterality: N/A;   Family History  Problem Relation Age of Onset   Thyroid  disease Mother    Arthritis Mother     Lung cancer Father        lung   Migraines Sister    Depression Sister    Migraines Daughter    Cirrhosis Brother    Breast cancer Neg Hx    Social History   Occupational History   Occupation: Disabled  Tobacco Use   Smoking status: Never   Smokeless tobacco: Never  Vaping Use   Vaping status: Never Used  Substance and Sexual Activity   Alcohol use: No    Alcohol/week: 0.0 standard drinks of alcohol    Comment: occ   Drug use: No   Sexual activity: Yes    Birth control/protection: Surgical   Tobacco Counseling Counseling given: Yes  SDOH Screenings   Food Insecurity: No Food Insecurity (06/19/2024)  Housing: Low Risk (06/19/2024)  Transportation Needs: No Transportation Needs (06/19/2024)  Utilities: Not At Risk (06/19/2024)  Alcohol Screen: Low Risk (06/18/2023)  Depression (PHQ2-9): Medium Risk (06/19/2024)  Financial Resource Strain: Low Risk (11/24/2023)   Received from [facility]  Physical Activity: Insufficiently Active (06/19/2024)  Social Connections: Moderately Integrated (06/19/2024)  Stress: No Stress Concern Present (06/19/2024)  Tobacco Use: Low Risk (06/19/2024)  Health Literacy: Adequate Health Literacy (06/19/2024)   See flowsheets for full screening details  Depression Screen PHQ 2 & 9 Depression Scale- Over the past 2 weeks, how often have you been bothered by any of the following problems? Little interest or pleasure in doing things: 1 Feeling down, depressed, or hopeless (PHQ Adolescent also includes...irritable): 1 PHQ-2 Total Score: 2 Trouble falling or staying asleep, or sleeping too much: 1 Feeling tired or having little energy: 1 Poor appetite or overeating (PHQ Adolescent also includes...weight loss): 1 Feeling bad about yourself - or that you are a failure or have let yourself or your family down: 0 Trouble concentrating on things, such as reading the newspaper or watching television (PHQ Adolescent also includes...like school work): 1 Moving  or speaking so slowly that other people could have noticed. Or the opposite - being so fidgety or restless that you have been moving around a lot more than usual: 0 Thoughts that you would be better off dead, or of hurting yourself in some way: 0 PHQ-9 Total Score: 6 If you checked off any problems, how difficult have these problems made it for you to do your work, take care of things at home, or get along with other people?: Not difficult at all  Depression Treatment Depression Interventions/Treatment : EYV7-0 Score <4 Follow-up Not Indicated     Goals Addressed             This Visit's Progress    Stay Active and Independent       Chair exercise every day for  Objective:    Today's Vitals   06/19/24 1237  BP: (!) 146/79  Pulse: 95  Weight: 235 lb (106.6 kg)  Height: 5' 1 (1.549 m)   Body mass index is 44.4 kg/m.  Hearing/Vision screen No results found. Immunizations and Health Maintenance Health Maintenance  Topic Date Due   FOOT EXAM  Never done   OPHTHALMOLOGY EXAM  Never done   Diabetic kidney evaluation - Urine ACR  Never done   Pneumococcal Vaccine: 50+ Years (1 of 2 - PCV) Never done   Cervical Cancer Screening (HPV/Pap Cotest)  01/31/2022   Mammogram  12/04/2022   DTaP/Tdap/Td (5 - Td or Tdap) 11/03/2023   COVID-19 Vaccine (4 - 2025-26 season) 01/31/2024   HEMOGLOBIN A1C  12/10/2024   Diabetic kidney evaluation - eGFR measurement  06/12/2025   Medicare Annual Wellness (AWV)  06/19/2025   Colonoscopy  08/25/2025   Influenza Vaccine  Completed   Hepatitis B Vaccines 19-59 Average Risk  Completed   HPV VACCINES (No Doses Required) Completed   Hepatitis C Screening  Completed   HIV Screening  Completed   Zoster Vaccines- Shingrix  Completed   Meningococcal B Vaccine  Aged Out        Assessment/Plan:  This is a routine wellness examination for [facility].  Patient Care Team: Dettinger, Fonda LABOR, MD as PCP - General (Family  Medicine) Patwa, Leita MATSU, MD as Referring Physician (Gastroenterology) Cloretta Arley NOVAK, MD as Consulting Physician (Oncology) Ladora Ross Lacy Phebe, MD as Referring Physician [facility])  I have personally reviewed and noted the following in the patients chart:   Medical and social history Use of alcohol, tobacco or illicit drugs  Current medications and supplements including opioid prescriptions. Functional ability and status Nutritional status Physical activity Advanced directives List of other physicians Hospitalizations, surgeries, and ER visits in previous 12 months Vitals Screenings to include cognitive, depression, and falls Referrals and appointments  Orders Placed This Encounter  Procedures   MM DIGITAL SCREENING BILATERAL    Standing Status:   Future    Expiration Date:   06/19/2025    Is the patient pregnant?:   No    Preferred imaging location?:   [facility]    Reason for exam::   breast cancer screening    Release to patient:   Immediate   In addition, I have reviewed and discussed with patient certain preventive protocols, quality metrics, and best practice recommendations. A written personalized care plan for preventive services as well as general preventive health recommendations were provided to patient.   Ozie Ned, CMA   06/19/2024   Return in 1 year (on 06/19/2025).  After Visit Summary: (MyChart) Due to this being a telephonic visit, the after visit summary with patients personalized plan was offered to patient via MyChart   Nurse Notes: PCP F/U: PT IS AWARE AND DUE THE FOLLOWING, WILL GET AT NEXT OV W/PCP: PAP/DTAP, FOOT EXAM, URINEACR, PNEUMONIA VACCINE. MAMMOGRAM--ORDERED PLACED, PT WILL MAKE DIABETIC EYE EXAM APT, PT DECLINED COVID VACCINE "

## 2024-06-19 NOTE — Telephone Encounter (Signed)
Noted, will close encounter. 

## 2024-06-19 NOTE — Telephone Encounter (Signed)
 Copied from CRM [id number]. Topic: General - Other >> Jun 19, 2024  3:08 PM Zebedee SAUNDERS wrote: Reason for CRM: Pt returning [employer], [place] MATSU, LPN call regarding lab results which were provided. Pt did not have any questions.

## 2024-06-19 NOTE — Patient Instructions (Signed)
 Ms. Langenderfer,  Thank you for taking the time for your Medicare Wellness Visit. I appreciate your continued commitment to your health goals. Please review the care plan we discussed, and feel free to reach out if I can assist you further.  Please note that Annual Wellness Visits do not include a physical exam. Some assessments may be limited, especially if the visit was conducted virtually. If needed, we may recommend an in-person follow-up with your provider.  Ongoing Care Seeing your primary care provider every 3 to 6 months helps us  monitor your health and provide consistent, personalized care.   Referrals If a referral was made during today's visit and you haven't received any updates within two weeks, please contact the referred provider directly to check on the status.  Recommended Screenings:  Health Maintenance  Topic Date Due   Complete foot exam   Never done   Eye exam for diabetics  Never done   Kidney health urinalysis for diabetes  Never done   Pneumococcal Vaccine for age over 12 (1 of 2 - PCV) Never done   Pap with HPV screening  01/31/2022   Breast Cancer Screening  12/04/2022   DTaP/Tdap/Td vaccine (5 - Td or Tdap) 11/03/2023   COVID-19 Vaccine (4 - 2025-26 season) 01/31/2024   Medicare Annual Wellness Visit  06/17/2024   Hemoglobin A1C  12/10/2024   Yearly kidney function blood test for diabetes  06/12/2025   Colon Cancer Screening  08/25/2025   Flu Shot  Completed   Hepatitis B Vaccine  Completed   HPV Vaccine (No Doses Required) Completed   Hepatitis C Screening  Completed   HIV Screening  Completed   Zoster (Shingles) Vaccine  Completed   Meningitis B Vaccine  Aged Out       06/19/2024   12:41 PM  Advanced Directives  Does Patient Have a Medical Advance Directive? No  Would patient like information on creating a medical advance directive? No - Patient declined    Vision: Annual vision screenings are recommended for early detection of glaucoma, cataracts,  and diabetic retinopathy. These exams can also reveal signs of chronic conditions such as diabetes and high blood pressure.  Dental: Annual dental screenings help detect early signs of oral cancer, gum disease, and other conditions linked to overall health, including heart disease and diabetes.  Please see the attached documents for additional preventive care recommendations.

## 2024-07-01 ENCOUNTER — Other Ambulatory Visit: Payer: Self-pay | Admitting: Family Medicine

## 2024-07-10 ENCOUNTER — Encounter

## 2024-09-11 ENCOUNTER — Ambulatory Visit: Admitting: Family Medicine

## 2024-09-13 ENCOUNTER — Ambulatory Visit: Admitting: Family Medicine

## 2025-06-20 ENCOUNTER — Ambulatory Visit
